# Patient Record
Sex: Female | Born: 1935
Health system: Southern US, Community
[De-identification: ages and names within clinical notes are randomized; demographics above are authoritative.]

## PROBLEM LIST (undated history)

## (undated) DIAGNOSIS — I5189 Other ill-defined heart diseases: Secondary | ICD-10-CM

## (undated) DIAGNOSIS — R4189 Other symptoms and signs involving cognitive functions and awareness: Secondary | ICD-10-CM

## (undated) DIAGNOSIS — E785 Hyperlipidemia, unspecified: Secondary | ICD-10-CM

## (undated) DIAGNOSIS — M4856XA Collapsed vertebra, not elsewhere classified, lumbar region, initial encounter for fracture: Secondary | ICD-10-CM

## (undated) DIAGNOSIS — Z9981 Dependence on supplemental oxygen: Secondary | ICD-10-CM

## (undated) DIAGNOSIS — J439 Emphysema, unspecified: Secondary | ICD-10-CM

## (undated) DIAGNOSIS — I509 Heart failure, unspecified: Secondary | ICD-10-CM

## (undated) DIAGNOSIS — I10 Essential (primary) hypertension: Secondary | ICD-10-CM

## (undated) DIAGNOSIS — S72009A Fracture of unspecified part of neck of unspecified femur, initial encounter for closed fracture: Secondary | ICD-10-CM

## (undated) DIAGNOSIS — Z66 Do not resuscitate: Secondary | ICD-10-CM

## (undated) DIAGNOSIS — K8020 Calculus of gallbladder without cholecystitis without obstruction: Secondary | ICD-10-CM

## (undated) DIAGNOSIS — I491 Atrial premature depolarization: Secondary | ICD-10-CM

## (undated) DIAGNOSIS — R002 Palpitations: Secondary | ICD-10-CM

## (undated) DIAGNOSIS — J449 Chronic obstructive pulmonary disease, unspecified: Secondary | ICD-10-CM

## (undated) DIAGNOSIS — F809 Developmental disorder of speech and language, unspecified: Secondary | ICD-10-CM

## (undated) DIAGNOSIS — L821 Other seborrheic keratosis: Secondary | ICD-10-CM

## (undated) DIAGNOSIS — Z87891 Personal history of nicotine dependence: Secondary | ICD-10-CM

## (undated) DIAGNOSIS — S22009A Unspecified fracture of unspecified thoracic vertebra, initial encounter for closed fracture: Secondary | ICD-10-CM

## (undated) DIAGNOSIS — I219 Acute myocardial infarction, unspecified: Secondary | ICD-10-CM

## (undated) DIAGNOSIS — M810 Age-related osteoporosis without current pathological fracture: Secondary | ICD-10-CM

## (undated) HISTORY — PX: TUBAL LIGATION: SHX77

---

## 2002-06-11 ENCOUNTER — Encounter: Payer: Self-pay | Admitting: Family Medicine

## 2002-06-11 ENCOUNTER — Inpatient Hospital Stay (HOSPITAL_COMMUNITY): Admission: EM | Admit: 2002-06-11 | Discharge: 2002-06-17 | Payer: Self-pay | Admitting: Internal Medicine

## 2002-06-14 ENCOUNTER — Encounter: Payer: Self-pay | Admitting: Internal Medicine

## 2002-06-16 ENCOUNTER — Encounter: Payer: Self-pay | Admitting: Internal Medicine

## 2002-06-17 ENCOUNTER — Encounter: Payer: Self-pay | Admitting: Internal Medicine

## 2003-10-14 ENCOUNTER — Ambulatory Visit (HOSPITAL_COMMUNITY): Admission: RE | Admit: 2003-10-14 | Discharge: 2003-10-14 | Payer: Self-pay | Admitting: Family Medicine

## 2003-12-30 ENCOUNTER — Ambulatory Visit (HOSPITAL_COMMUNITY): Admission: RE | Admit: 2003-12-30 | Discharge: 2003-12-30 | Payer: Self-pay | Admitting: Gastroenterology

## 2003-12-30 ENCOUNTER — Encounter (INDEPENDENT_AMBULATORY_CARE_PROVIDER_SITE_OTHER): Payer: Self-pay | Admitting: Specialist

## 2005-10-30 ENCOUNTER — Ambulatory Visit (HOSPITAL_COMMUNITY): Admission: RE | Admit: 2005-10-30 | Discharge: 2005-10-30 | Payer: Self-pay | Admitting: Ophthalmology

## 2006-02-12 ENCOUNTER — Ambulatory Visit (HOSPITAL_COMMUNITY): Admission: RE | Admit: 2006-02-12 | Discharge: 2006-02-12 | Payer: Self-pay | Admitting: Ophthalmology

## 2009-10-13 ENCOUNTER — Observation Stay (HOSPITAL_COMMUNITY): Admission: EM | Admit: 2009-10-13 | Discharge: 2009-10-14 | Payer: Self-pay | Admitting: Emergency Medicine

## 2009-10-13 ENCOUNTER — Ambulatory Visit: Payer: Self-pay | Admitting: Cardiology

## 2009-10-14 ENCOUNTER — Encounter (INDEPENDENT_AMBULATORY_CARE_PROVIDER_SITE_OTHER): Payer: Self-pay | Admitting: Internal Medicine

## 2009-12-06 ENCOUNTER — Ambulatory Visit (HOSPITAL_COMMUNITY): Admission: RE | Admit: 2009-12-06 | Discharge: 2009-12-06 | Payer: Self-pay | Admitting: Ophthalmology

## 2009-12-13 ENCOUNTER — Ambulatory Visit (HOSPITAL_COMMUNITY): Admission: RE | Admit: 2009-12-13 | Discharge: 2009-12-13 | Payer: Self-pay | Admitting: Ophthalmology

## 2011-03-15 LAB — CBC
HCT: 41.9 % (ref 36.0–46.0)
HCT: 42 % (ref 36.0–46.0)
Hemoglobin: 14.4 g/dL (ref 12.0–15.0)
Hemoglobin: 14.4 g/dL (ref 12.0–15.0)
MCHC: 34.4 g/dL (ref 30.0–36.0)
MCHC: 34.4 g/dL (ref 30.0–36.0)
MCV: 88.9 fL (ref 78.0–100.0)
MCV: 89.1 fL (ref 78.0–100.0)
Platelets: 223 10*3/uL (ref 150–400)
Platelets: 241 10*3/uL (ref 150–400)
RBC: 4.7 MIL/uL (ref 3.87–5.11)
RBC: 4.72 MIL/uL (ref 3.87–5.11)
RDW: 12.7 % (ref 11.5–15.5)
RDW: 12.8 % (ref 11.5–15.5)
WBC: 8.5 10*3/uL (ref 4.0–10.5)
WBC: 8.7 10*3/uL (ref 4.0–10.5)

## 2011-03-15 LAB — COMPREHENSIVE METABOLIC PANEL
ALT: 8 U/L (ref 0–35)
ALT: <8 U/L (ref 0–35)
AST: 15 U/L (ref 0–37)
AST: 16 U/L (ref 0–37)
Albumin: 3.3 g/dL — ABNORMAL LOW (ref 3.5–5.2)
Albumin: 3.5 g/dL (ref 3.5–5.2)
Alkaline Phosphatase: 68 U/L (ref 39–117)
Alkaline Phosphatase: 68 U/L (ref 39–117)
BUN: 2 mg/dL — ABNORMAL LOW (ref 6–23)
BUN: 2 mg/dL — ABNORMAL LOW (ref 6–23)
CO2: 30 mEq/L (ref 19–32)
CO2: 31 mEq/L (ref 19–32)
Calcium: 9.3 mg/dL (ref 8.4–10.5)
Calcium: 9.4 mg/dL (ref 8.4–10.5)
Chloride: 101 mEq/L (ref 96–112)
Chloride: 98 mEq/L (ref 96–112)
Creatinine, Ser: 0.52 mg/dL (ref 0.4–1.2)
Creatinine, Ser: 0.6 mg/dL (ref 0.4–1.2)
GFR calc Af Amer: >60 mL/min (ref >60)
GFR calc Af Amer: >60 mL/min (ref >60)
GFR calc non Af Amer: >60 mL/min (ref >60)
GFR calc non Af Amer: >60 mL/min (ref >60)
Glucose, Bld: 83 mg/dL (ref 70–99)
Glucose, Bld: 85 mg/dL (ref 70–99)
Potassium: 3.2 mEq/L — ABNORMAL LOW (ref 3.5–5.1)
Potassium: 4.5 mEq/L (ref 3.5–5.1)
Sodium: 135 mEq/L (ref 135–145)
Sodium: 137 mEq/L (ref 135–145)
Total Bilirubin: 0.6 mg/dL (ref 0.3–1.2)
Total Bilirubin: 0.8 mg/dL (ref 0.3–1.2)
Total Protein: 5.6 g/dL — ABNORMAL LOW (ref 6.0–8.3)
Total Protein: 6.4 g/dL (ref 6.0–8.3)

## 2011-03-15 LAB — DIFFERENTIAL
Basophils Absolute: 0 10*3/uL (ref 0.0–0.1)
Basophils Absolute: 0.1 10*3/uL (ref 0.0–0.1)
Basophils Relative: 0 % (ref 0–1)
Basophils Relative: 1 % (ref 0–1)
Eosinophils Absolute: 0.8 10*3/uL — ABNORMAL HIGH (ref 0.0–0.7)
Eosinophils Absolute: 0.8 10*3/uL — ABNORMAL HIGH (ref 0.0–0.7)
Eosinophils Relative: 10 % — ABNORMAL HIGH (ref 0–5)
Eosinophils Relative: 9 % — ABNORMAL HIGH (ref 0–5)
Lymphocytes Relative: 28 % (ref 12–46)
Lymphocytes Relative: 40 % (ref 12–46)
Lymphs Abs: 2.5 10*3/uL (ref 0.7–4.0)
Lymphs Abs: 3.4 10*3/uL (ref 0.7–4.0)
Monocytes Absolute: 0.8 10*3/uL (ref 0.1–1.0)
Monocytes Absolute: 0.9 10*3/uL (ref 0.1–1.0)
Monocytes Relative: 10 % (ref 3–12)
Monocytes Relative: 9 % (ref 3–12)
Neutro Abs: 3.4 10*3/uL (ref 1.7–7.7)
Neutro Abs: 4.5 10*3/uL (ref 1.7–7.7)
Neutrophils Relative %: 41 % — ABNORMAL LOW (ref 43–77)
Neutrophils Relative %: 52 % (ref 43–77)

## 2011-03-15 LAB — TSH: TSH: 0.604 u[IU]/mL (ref 0.350–4.500)

## 2011-03-15 LAB — LIPID PANEL
Cholesterol: 227 mg/dL — ABNORMAL HIGH (ref 0–200)
HDL: 55 mg/dL (ref >39)
LDL Cholesterol: 157 mg/dL — ABNORMAL HIGH (ref 0–99)
Total CHOL/HDL Ratio: 4.1 RATIO
Triglycerides: 77 mg/dL (ref <150)
VLDL: 15 mg/dL (ref 0–40)

## 2011-03-15 LAB — POCT CARDIAC MARKERS
CKMB, poc: <1 ng/mL — ABNORMAL LOW (ref 1.0–8.0)
CKMB, poc: <1 ng/mL — ABNORMAL LOW (ref 1.0–8.0)
Myoglobin, poc: 53.9 ng/mL (ref 12–200)
Myoglobin, poc: 59 ng/mL (ref 12–200)
Troponin i, poc: <0.05 ng/mL (ref 0.00–0.09)
Troponin i, poc: <0.05 ng/mL (ref 0.00–0.09)

## 2011-03-15 LAB — RAPID URINE DRUG SCREEN, HOSP PERFORMED
Amphetamines: NOT DETECTED
Barbiturates: NOT DETECTED
Benzodiazepines: NOT DETECTED
Cocaine: NOT DETECTED
Opiates: NOT DETECTED
Tetrahydrocannabinol: NOT DETECTED

## 2011-03-15 LAB — CARDIAC PANEL(CRET KIN+CKTOT+MB+TROPI)
CK, MB: 1.1 ng/mL (ref 0.3–4.0)
Relative Index: INVALID (ref 0.0–2.5)
Total CK: 31 U/L (ref 7–177)
Troponin I: 0.01 ng/mL (ref 0.00–0.06)

## 2011-03-15 LAB — CK TOTAL AND CKMB (NOT AT ARMC)
CK, MB: 1.1 ng/mL (ref 0.3–4.0)
Relative Index: INVALID (ref 0.0–2.5)
Total CK: 38 U/L (ref 7–177)

## 2011-03-15 LAB — LIPASE, BLOOD: Lipase: 24 U/L (ref 11–59)

## 2011-03-15 LAB — BRAIN NATRIURETIC PEPTIDE: Pro B Natriuretic peptide (BNP): <30 pg/mL (ref 0.0–100.0)

## 2011-04-28 NOTE — H&P (Signed)
[facility]  Patient:    Tiffany Maxwell, Tiffany Maxwell Visit Number: [id number] MRN: [id number]          Service Type: MED Location: 2A A219 01 Attending Physician:  Cassell Smiles. Dictated by:   John Giovanni, M.D. Admit Date:  06/11/2002   CC:         Rulon Abide, M.D.  Montey Hora, M.D., [facility]   History and Physical  HISTORY OF PRESENT ILLNESS:  This 77 year old woman has had a somewhat productive cough for the last two days.  She has fever, chills, and weakness. She has been short of breath.  She has been free of dysuria, abdominal pain, diarrhea, headache, or rash.  She was seen by her physician, Dr. Montey Hora, who felt that she probably had pneumonia and referred her to [facility] hospitalist.  MEDICATIONS:  Currently she is on Avalide 150/12.5 q.d. for hypertension. ALLERGIES:  CODEINE causes headaches.  SOCIAL HISTORY:  She smokes a pack a day and smoked since age 41, giving about a 50-pack-year history of cigarette smoking.  She has seven children, 13 grandchildren and, except for the first child delivered by Dr. Alona Bene, the rest were delivered by Dr. Tomasa Hose at [facility].  She otherwise has not been admitted to the hospital.  Lives with her husband in [place].  Her pulse oximetry was 86%.  Her urine output has been low.  EMERGENCY ROOM PHYSICAL EXAMINATION:  GENERAL:  Elderly woman appearing older than her stated age who is quite frail.  She appeared to be oriented and alert, in no acute distress.  She is able to give a fairly good history and answer questions.  Sentence structure was intact.  There was no slurring of her speech.  VITAL SIGNS:  Temperature 99.3, pulse 93, respiratory rate 28, blood pressure 96/49.  Height 5 feet 3 inches, weight 154 pounds.  Her O2 saturation on room air was 90%.  Recheck BPs were 81/52 and 85/57.  HEENT:  Mucous membranes were dry.  TMs gray.  Good light  reflexes.  Negative anterior and cervical nodes.  No axillary or supraclavicular adenopathy.  LUNGS:  Decreased breath sounds throughout.  Occasional crackles.  HEART:  Sounds were distant.  Rate of around 90.  ABDOMEN:  Soft.  Without hepatosplenomegaly or mass.  EXTREMITIES:  No edema of the ankles.  LABORATORY DATA:  Blood work included a blood gas on room air which showed pH 7.41, pCO2 41, pO2 52, bicarbonate 26.  White cell count 2300, 78% neutrophils, 18 lymphs, 5 monos.  H&H 14.6/41.6, MCV 84, platelet count 74,000.  Comprehensive metabolic panel showed sodium 132, glucose 166, potassium 3.4, albumin 3.2, SGOT 54.  DIAGNOSES: 1. Probable pneumonia. 2. Chronic obstructive pulmonary disease. 3. Long history of cigarette smoking. 4. Essential hypertension. 5. Electrolyte abnormalities including hyponatremia and hypokalemia. 6. Hyperglycemia. 7. Elevated SGOT.  PLAN:  Plan of treatment includes O2 at 2 L/min by nasal prongs. IV bolus due to her initial hypotension and then 100 cc/hr of normal saline for the first liter, then down to 75 cc/hr thereafter.  Will recheck CBC, MET-7 in the morning.  Start her on Rocephin 1 g IV q.24h., first dose stat, with Zithromax 500 mg immediately, followed by 250 q.d. for four days.  Chest x-ray is pending.  Other etiologies to her illness are possible, including West Nile virus, although this would be less likely since she is actually coughing up small amounts of thick sputum.  Sputum for  Grams stain, CNS pending. Dictated by:   John Giovanni, M.D. Attending Physician:  Cassell Smiles DD:  06/11/02 TD:  06/15/02 Job: [id number]

## 2011-04-28 NOTE — Discharge Summary (Signed)
[facility]  Patient:    Tiffany Maxwell, Tiffany Maxwell Visit Number: [id number] MRN: [id number]          Service Type: MED Location: 2A A219 01 Attending Physician:  Marcene Corning Dictated by:   Rulon Abide, M.D. Admit Date:  06/11/2002 Discharge Date: 06/17/2002   CC:         [facility], Attention: Dr. Dimple Casey, Valinda Hoar [phone] and mail CC also                           Discharge Summary  DISCHARGE DIAGNOSES: 1. Chronic obstructive pulmonary disease exacerbation--much improved by day of    discharge. Discharged on Combivent, Flovent, and albuterol inhalers as    noted below along with prednisone taper. 2. Questionable congestive heart failure on one of her chest x-rays this    admission, but 2-D echocardiogram was normal as noted below. 3. Questionable right lower lobe pneumonia--much improved by day of discharge,    on Levaquin. Discharged on Levaquin for an additional three days. 4. Cigarette abuse discussed with her in detail during this hospitalization. 5. Diarrhea--resolved by day of discharge. Probably secondary to infection.    There was no Clostridium difficile noted. 6. Decreased platelets on admission probably secondary to infection, now back    to normal. 7. Decreased white blood cells on admission. Question secondary to infection,    now back to normal on last check.  DISCHARGE MEDICATIONS: 1. Prednisone. She will take 50 mg tomorrow and decrease by 10 mg every day    until she is off. 2. Combivent metered-dose inhaler two puffs every 8 hours. 3. Albuterol metered-dose inhaler two puffs every 4 hours as needed for    wheezing. 4. Flovent metered-dose inhaler two puffs every 12 hours and she is to rinse    and spit after. 5. Pepcid 20 mg tabs one p.o. b.i.d.  FOLLOWUP:  She will follow up with [facility] medicine and they will call her today for an appointment and give her the day and time.  DIET:   Low salt, as tolerated.  ACTIVITY:   No strenuous activity, no heavy lifting.  DISCHARGE INSTRUCTIONS:  She is to stop smoking.  HISTORY AND PHYSICAL:  Please refer to the dictated History and Physical by Dr. Sudie Bailey.  LABORATORY AND ACCESSORY DATA:  The patients initial white cell count was 2.3, last check on June 16, 2002 was 12.2. Her initial platelet count was 74,000 and last check on June 16, 2002 it was 161,000. Her initial sodium was 132, last check was 138. Initial potassium was 3.4, last check on July 5th was 5.0. Her BUN was 7, creatinine 0.6 last check. AST was very slightly elevated on admission at 54. The remainder of her liver function tests were normal. Her TSH was normal at 0.58. Stool cultures were negative to date and a C. difficile toxin was negative.  Her x-ray on admission showed COPD changes. Repeat chest x-ray on June 14, 2002 showed some questionable CHF and some small bilateral effusions. On the day of discharge, her chest x-ray showed a questionable right lower lobe small infiltrate but no CHF, and no effusions. 2-D echocardiogram showed normal left ventricular systolic function, normal right ventricular systolic function, no wall motion abnormalities. Right ventricular systolic pressure was 36 mmHg. Her EKG showed sinus rhythm with T wave flattening in V6, T-wave inversions in V2, some mild T wave flattening in her inferior leads.  HOSPITAL COURSE: #1 - CHRONIC OBSTRUCTIVE PULMONARY DISEASE EXACERBATION:  On admission, the patient did have significant amount of wheezing and shortness of breath, and hypoxemia consistent with COPD exacerbation. She has a long history of cigarette abuse. She was treated with IV steroids and hand-held nebulizers initially, and we were able to change her to metered-dose inhalers and p.o. steroids as noted above. She will be discharged on prednisone taper as noted above along with her inhalers. She was on O2 at admission and by  day of discharge we were able to take her to room air, and after ambulating in the halls her saturations were 91-92%. She was not wheezing on exam the day of discharge.  #2 - QUESTIONABLE CONGESTIVE HEART FAILURE:  This was noted on one of her chest x-rays. A 2-D echocardiogram did not show any significant left ventricular or right ventricular systolic function, and there was no wall motion abnormalities. This may have been from overhydration initially. I did not do any further testing at this time.  #3 - THROMBOCYTOPENIA:  Her platelets were noted to be low on admission. I felt this was probably secondary to infection. They did return to normal by day of discharge and the labs that were done one day prior to discharge.  #4 - LEUKOPENIA:  This was also noted on admission but by one day prior to discharge her white cell counts were back to normal. I feel that she needs another CBC after finishing her prednisone taper to see if her white cell counts will go down again.  #5 - DIARRHEA:  She did develop diarrhea during this hospitalization, treated with Imodium and resolved. Stool studies did not show any significant bacterial infection and C. difficile toxin was negative.  #6 - HYPERTENSION:  I held her blood pressure medicines throughout this hospitalization and treated her with low salt diet. She has had normal blood pressure checks throughout the entire hospitalization with systolic blood pressure readings in the 115-140 range. Diastolics have all been less than 90. I would simply ask her to stay on a low salt diet, but she may have to have her medication restarted by her primary care physician at followup as she may not follow a strict low salt diet at home. Dictated by:   Rulon Abide, M.D. Attending Physician:  Marcene Corning DD:  06/17/02 TD:  06/19/02 Job: [id number]

## 2011-04-28 NOTE — Op Note (Signed)
NAME:  Tiffany Maxwell, TOLLIVER                       ACCOUNT NO.:  [id number]   MEDICAL RECORD NO.:  [id number]                   PATIENT TYPE:  AMB   LOCATION:  ENDO                                 FACILITY:  [facility]   PHYSICIAN:  John C. Madilyn Fireman, M.D.                 DATE OF BIRTH:  1934/07/26   DATE OF PROCEDURE:  12/30/2003  DATE OF DISCHARGE:                                 OPERATIVE REPORT   PROCEDURE:  Colonoscopy.   INDICATIONS FOR PROCEDURE:  Occasional rectal bleeding in a 77 year old  patient with no recent  colon screening.   DESCRIPTION OF PROCEDURE:  The patient was placed in the left lateral  decubitus position and placed on the pulse monitor with continuous low flow  oxygen delivered by nasal cannula. She was sedated with 70 micrograms of IV  Fentanyl and 7 mg of IV Versed.   The Olympus video colonoscope was inserted into the rectum and advanced to  the cecum, confirmed by transillumination of McBurney's point and  visualization of the ileocecal valve and appendiceal orifice.  The prep was  excellent.   The cecum, ascending , transverse, descending and sigmoid colon all appeared  normal with no masses, polyps, diverticula or other mucosal abnormalities.  There was a 1.2-cm sessile polyp at about 20 cm near the rectosigmoid  junction  and this was removed by snare. A slightly smaller 1-cm sessile  polyp was found at 15 cm in the rectum and removed by snare and sent in the  same specimen container. The remainder of the rectum likewise appeared  normal down to the anus where retroflex view revealed no obviously enlarged  internal hemorrhoids.   The colonoscope was then withdrawn and the patient was returned to the  recovery room in stable condition. She tolerated the procedure well and  there were no immediate complications.   IMPRESSION:  Rectosigmoid and rectal polyps.   PLAN:  Await histology to determine method and interval for future colon  screening and to rule out  malignancy.                                               John C. Madilyn Fireman, M.D.    [facility]/MEDQ  D:  12/30/2003  T:  12/30/2003  Job:  [id number]   cc:   Magnus Sinning. Dimple Casey, M.D.  [address] [place]  [place] [address]  Fax: [phone]

## 2012-01-23 DIAGNOSIS — Z961 Presence of intraocular lens: Secondary | ICD-10-CM | POA: Diagnosis not present

## 2012-01-23 DIAGNOSIS — H53469 Homonymous bilateral field defects, unspecified side: Secondary | ICD-10-CM | POA: Diagnosis not present

## 2012-01-23 DIAGNOSIS — H538 Other visual disturbances: Secondary | ICD-10-CM | POA: Diagnosis not present

## 2012-02-26 DIAGNOSIS — R7989 Other specified abnormal findings of blood chemistry: Secondary | ICD-10-CM | POA: Diagnosis not present

## 2012-02-26 DIAGNOSIS — E785 Hyperlipidemia, unspecified: Secondary | ICD-10-CM | POA: Diagnosis not present

## 2012-02-26 DIAGNOSIS — I252 Old myocardial infarction: Secondary | ICD-10-CM | POA: Diagnosis not present

## 2012-02-26 DIAGNOSIS — E559 Vitamin D deficiency, unspecified: Secondary | ICD-10-CM | POA: Diagnosis not present

## 2012-02-26 DIAGNOSIS — I10 Essential (primary) hypertension: Secondary | ICD-10-CM | POA: Diagnosis not present

## 2012-04-04 DIAGNOSIS — E538 Deficiency of other specified B group vitamins: Secondary | ICD-10-CM | POA: Diagnosis not present

## 2012-04-18 DIAGNOSIS — E538 Deficiency of other specified B group vitamins: Secondary | ICD-10-CM | POA: Diagnosis not present

## 2012-05-02 DIAGNOSIS — E538 Deficiency of other specified B group vitamins: Secondary | ICD-10-CM | POA: Diagnosis not present

## 2012-06-11 ENCOUNTER — Other Ambulatory Visit: Payer: Self-pay | Admitting: Gastroenterology

## 2012-06-11 DIAGNOSIS — D126 Benign neoplasm of colon, unspecified: Secondary | ICD-10-CM | POA: Diagnosis not present

## 2012-06-11 DIAGNOSIS — K5730 Diverticulosis of large intestine without perforation or abscess without bleeding: Secondary | ICD-10-CM | POA: Diagnosis not present

## 2012-06-11 DIAGNOSIS — Z86010 Personal history of colonic polyps: Secondary | ICD-10-CM | POA: Diagnosis not present

## 2012-06-11 DIAGNOSIS — Z09 Encounter for follow-up examination after completed treatment for conditions other than malignant neoplasm: Secondary | ICD-10-CM | POA: Diagnosis not present

## 2012-10-16 DIAGNOSIS — E559 Vitamin D deficiency, unspecified: Secondary | ICD-10-CM | POA: Diagnosis not present

## 2012-10-16 DIAGNOSIS — E785 Hyperlipidemia, unspecified: Secondary | ICD-10-CM | POA: Diagnosis not present

## 2012-10-16 DIAGNOSIS — R5381 Other malaise: Secondary | ICD-10-CM | POA: Diagnosis not present

## 2012-10-16 DIAGNOSIS — J441 Chronic obstructive pulmonary disease with (acute) exacerbation: Secondary | ICD-10-CM | POA: Diagnosis not present

## 2012-10-16 DIAGNOSIS — R5383 Other fatigue: Secondary | ICD-10-CM | POA: Diagnosis not present

## 2012-10-16 DIAGNOSIS — I10 Essential (primary) hypertension: Secondary | ICD-10-CM | POA: Diagnosis not present

## 2013-01-03 DIAGNOSIS — M545 Low back pain: Secondary | ICD-10-CM | POA: Diagnosis not present

## 2013-01-16 DIAGNOSIS — J441 Chronic obstructive pulmonary disease with (acute) exacerbation: Secondary | ICD-10-CM | POA: Diagnosis not present

## 2013-01-16 DIAGNOSIS — M546 Pain in thoracic spine: Secondary | ICD-10-CM | POA: Diagnosis not present

## 2013-01-20 DIAGNOSIS — L509 Urticaria, unspecified: Secondary | ICD-10-CM | POA: Diagnosis not present

## 2013-01-28 ENCOUNTER — Encounter (HOSPITAL_COMMUNITY): Payer: Self-pay

## 2013-01-28 ENCOUNTER — Inpatient Hospital Stay (HOSPITAL_COMMUNITY)
Admission: EM | Admit: 2013-01-28 | Discharge: 2013-01-30 | DRG: 190 | Disposition: A | Discharge status: Home Health | Payer: Medicare Other | Attending: Internal Medicine | Admitting: Internal Medicine

## 2013-01-28 ENCOUNTER — Observation Stay (HOSPITAL_COMMUNITY): Payer: Medicare Other

## 2013-01-28 ENCOUNTER — Emergency Department (HOSPITAL_COMMUNITY): Payer: Medicare Other

## 2013-01-28 DIAGNOSIS — E785 Hyperlipidemia, unspecified: Secondary | ICD-10-CM | POA: Diagnosis present

## 2013-01-28 DIAGNOSIS — T148XXA Other injury of unspecified body region, initial encounter: Secondary | ICD-10-CM | POA: Diagnosis not present

## 2013-01-28 DIAGNOSIS — IMO0002 Reserved for concepts with insufficient information to code with codable children: Secondary | ICD-10-CM

## 2013-01-28 DIAGNOSIS — I5189 Other ill-defined heart diseases: Secondary | ICD-10-CM

## 2013-01-28 DIAGNOSIS — M8448XA Pathological fracture, other site, initial encounter for fracture: Secondary | ICD-10-CM | POA: Diagnosis present

## 2013-01-28 DIAGNOSIS — J439 Emphysema, unspecified: Secondary | ICD-10-CM

## 2013-01-28 DIAGNOSIS — S22009A Unspecified fracture of unspecified thoracic vertebra, initial encounter for closed fracture: Secondary | ICD-10-CM | POA: Diagnosis not present

## 2013-01-28 DIAGNOSIS — J449 Chronic obstructive pulmonary disease, unspecified: Secondary | ICD-10-CM | POA: Diagnosis not present

## 2013-01-28 DIAGNOSIS — J441 Chronic obstructive pulmonary disease with (acute) exacerbation: Secondary | ICD-10-CM

## 2013-01-28 DIAGNOSIS — J440 Chronic obstructive pulmonary disease with acute lower respiratory infection: Principal | ICD-10-CM | POA: Diagnosis present

## 2013-01-28 DIAGNOSIS — R21 Rash and other nonspecific skin eruption: Secondary | ICD-10-CM | POA: Diagnosis present

## 2013-01-28 DIAGNOSIS — I509 Heart failure, unspecified: Secondary | ICD-10-CM | POA: Diagnosis not present

## 2013-01-28 DIAGNOSIS — Z87891 Personal history of nicotine dependence: Secondary | ICD-10-CM | POA: Diagnosis not present

## 2013-01-28 DIAGNOSIS — I491 Atrial premature depolarization: Secondary | ICD-10-CM | POA: Diagnosis present

## 2013-01-28 DIAGNOSIS — Z79899 Other long term (current) drug therapy: Secondary | ICD-10-CM | POA: Diagnosis not present

## 2013-01-28 DIAGNOSIS — I252 Old myocardial infarction: Secondary | ICD-10-CM

## 2013-01-28 DIAGNOSIS — I10 Essential (primary) hypertension: Secondary | ICD-10-CM | POA: Diagnosis not present

## 2013-01-28 DIAGNOSIS — J438 Other emphysema: Secondary | ICD-10-CM | POA: Diagnosis not present

## 2013-01-28 DIAGNOSIS — R0600 Dyspnea, unspecified: Secondary | ICD-10-CM

## 2013-01-28 DIAGNOSIS — R7309 Other abnormal glucose: Secondary | ICD-10-CM | POA: Diagnosis present

## 2013-01-28 DIAGNOSIS — M4854XA Collapsed vertebra, not elsewhere classified, thoracic region, initial encounter for fracture: Secondary | ICD-10-CM | POA: Diagnosis present

## 2013-01-28 DIAGNOSIS — E876 Hypokalemia: Secondary | ICD-10-CM

## 2013-01-28 DIAGNOSIS — T380X5A Adverse effect of glucocorticoids and synthetic analogues, initial encounter: Secondary | ICD-10-CM | POA: Diagnosis present

## 2013-01-28 DIAGNOSIS — E871 Hypo-osmolality and hyponatremia: Secondary | ICD-10-CM | POA: Diagnosis not present

## 2013-01-28 DIAGNOSIS — J209 Acute bronchitis, unspecified: Principal | ICD-10-CM | POA: Diagnosis present

## 2013-01-28 DIAGNOSIS — R0609 Other forms of dyspnea: Secondary | ICD-10-CM | POA: Diagnosis not present

## 2013-01-28 DIAGNOSIS — K8020 Calculus of gallbladder without cholecystitis without obstruction: Secondary | ICD-10-CM | POA: Diagnosis present

## 2013-01-28 DIAGNOSIS — M810 Age-related osteoporosis without current pathological fracture: Secondary | ICD-10-CM | POA: Diagnosis not present

## 2013-01-28 DIAGNOSIS — R0902 Hypoxemia: Secondary | ICD-10-CM | POA: Diagnosis not present

## 2013-01-28 DIAGNOSIS — J189 Pneumonia, unspecified organism: Secondary | ICD-10-CM | POA: Diagnosis not present

## 2013-01-28 DIAGNOSIS — R0602 Shortness of breath: Secondary | ICD-10-CM | POA: Diagnosis not present

## 2013-01-28 DIAGNOSIS — J4489 Other specified chronic obstructive pulmonary disease: Secondary | ICD-10-CM | POA: Diagnosis not present

## 2013-01-28 HISTORY — DX: Other ill-defined heart diseases: I51.89

## 2013-01-28 HISTORY — DX: Calculus of gallbladder without cholecystitis without obstruction: K80.20

## 2013-01-28 HISTORY — DX: Unspecified fracture of unspecified thoracic vertebra, initial encounter for closed fracture: S22.009A

## 2013-01-28 HISTORY — DX: Other seborrheic keratosis: L82.1

## 2013-01-28 HISTORY — DX: Essential (primary) hypertension: I10

## 2013-01-28 HISTORY — DX: Palpitations: R00.2

## 2013-01-28 HISTORY — DX: Atrial premature depolarization: I49.1

## 2013-01-28 HISTORY — DX: Emphysema, unspecified: J43.9

## 2013-01-28 HISTORY — DX: Hyperlipidemia, unspecified: E78.5

## 2013-01-28 HISTORY — DX: Chronic obstructive pulmonary disease, unspecified: J44.9

## 2013-01-28 HISTORY — DX: Age-related osteoporosis without current pathological fracture: M81.0

## 2013-01-28 HISTORY — DX: Personal history of nicotine dependence: Z87.891

## 2013-01-28 HISTORY — DX: Acute myocardial infarction, unspecified: I21.9

## 2013-01-28 LAB — TROPONIN I: Troponin I: <0.3 ng/mL (ref <0.30)

## 2013-01-28 LAB — URINALYSIS, ROUTINE W REFLEX MICROSCOPIC
Glucose, UA: NEGATIVE mg/dL
Leukocytes, UA: NEGATIVE
Protein, ur: NEGATIVE mg/dL
Specific Gravity, Urine: 1.015 (ref 1.005–1.030)
Urobilinogen, UA: 0.2 mg/dL (ref 0.0–1.0)

## 2013-01-28 LAB — CBC WITH DIFFERENTIAL/PLATELET
Basophils Absolute: 0 10*3/uL (ref 0.0–0.1)
Eosinophils Relative: 7 % — ABNORMAL HIGH (ref 0–5)
Lymphocytes Relative: 31 % (ref 12–46)
Lymphs Abs: 2.5 10*3/uL (ref 0.7–4.0)
MCV: 85.1 fL (ref 78.0–100.0)
Neutrophils Relative %: 52 % (ref 43–77)
Platelets: 266 10*3/uL (ref 150–400)
RBC: 4.98 MIL/uL (ref 3.87–5.11)
RDW: 12.8 % (ref 11.5–15.5)
WBC: 8.1 10*3/uL (ref 4.0–10.5)

## 2013-01-28 LAB — BASIC METABOLIC PANEL
CO2: 33 mEq/L — ABNORMAL HIGH (ref 19–32)
Calcium: 9.8 mg/dL (ref 8.4–10.5)
GFR calc non Af Amer: 89 mL/min — ABNORMAL LOW (ref >90)
Potassium: 3.3 mEq/L — ABNORMAL LOW (ref 3.5–5.1)
Sodium: 131 mEq/L — ABNORMAL LOW (ref 135–145)

## 2013-01-28 LAB — MAGNESIUM: Magnesium: 1.8 mg/dL (ref 1.5–2.5)

## 2013-01-28 MED ORDER — ALUM & MAG HYDROXIDE-SIMETH 200-200-20 MG/5ML PO SUSP: 30.0000 mL | Freq: Four times a day (QID) | ORAL | Status: DC | PRN

## 2013-01-28 MED ORDER — SENNA 8.6 MG PO TABS
1.0000 | ORAL_TABLET | Freq: Two times a day (BID) | ORAL | Status: DC
  Administered 2013-01-29: 8.6 mg via ORAL
  Filled 2013-01-28 (×3): qty 1

## 2013-01-28 MED ORDER — LEVOFLOXACIN IN D5W 750 MG/150ML IV SOLN
750.0000 mg | Freq: Once | INTRAVENOUS | Status: AC
  Administered 2013-01-28: 750 mg via INTRAVENOUS
  Filled 2013-01-28: qty 150

## 2013-01-28 MED ORDER — LEVOFLOXACIN IN D5W 750 MG/150ML IV SOLN
750.0000 mg | INTRAVENOUS | Status: DC
  Administered 2013-01-29: 750 mg via INTRAVENOUS
  Filled 2013-01-28 (×2): qty 150

## 2013-01-28 MED ORDER — ASPIRIN EC 325 MG PO TBEC
325.0000 mg | DELAYED_RELEASE_TABLET | Freq: Every day | ORAL | Status: DC
  Administered 2013-01-29 – 2013-01-30 (×2): 325 mg via ORAL
  Filled 2013-01-28 (×2): qty 1

## 2013-01-28 MED ORDER — ONDANSETRON HCL 4 MG PO TABS: 4.0000 mg | ORAL_TABLET | Freq: Four times a day (QID) | ORAL | Status: DC | PRN

## 2013-01-28 MED ORDER — ONDANSETRON HCL 4 MG/2ML IJ SOLN: 4.0000 mg | Freq: Four times a day (QID) | INTRAMUSCULAR | Status: DC | PRN

## 2013-01-28 MED ORDER — ALBUTEROL SULFATE (5 MG/ML) 0.5% IN NEBU
5.0000 mg | INHALATION_SOLUTION | Freq: Once | RESPIRATORY_TRACT | Status: AC
  Administered 2013-01-28: 5 mg via RESPIRATORY_TRACT
  Filled 2013-01-28: qty 1

## 2013-01-28 MED ORDER — SODIUM CHLORIDE 0.9 % IV SOLN: 250.0000 mL | INTRAVENOUS | Status: DC | PRN

## 2013-01-28 MED ORDER — ZOLPIDEM TARTRATE 5 MG PO TABS
5.0000 mg | ORAL_TABLET | Freq: Every evening | ORAL | Status: DC | PRN
  Filled 2013-01-28: qty 1

## 2013-01-28 MED ORDER — SODIUM CHLORIDE 0.9 % IJ SOLN: 3.0000 mL | INTRAMUSCULAR | Status: DC | PRN

## 2013-01-28 MED ORDER — ACETAMINOPHEN 325 MG PO TABS: 650.0000 mg | ORAL_TABLET | Freq: Four times a day (QID) | ORAL | Status: DC | PRN

## 2013-01-28 MED ORDER — ALBUTEROL SULFATE (5 MG/ML) 0.5% IN NEBU
2.5000 mg | INHALATION_SOLUTION | Freq: Four times a day (QID) | RESPIRATORY_TRACT | Status: DC
  Administered 2013-01-29 – 2013-01-30 (×5): 2.5 mg via RESPIRATORY_TRACT
  Filled 2013-01-28 (×5): qty 0.5

## 2013-01-28 MED ORDER — IPRATROPIUM BROMIDE 0.02 % IN SOLN
0.5000 mg | Freq: Four times a day (QID) | RESPIRATORY_TRACT | Status: DC
  Administered 2013-01-29 – 2013-01-30 (×5): 0.5 mg via RESPIRATORY_TRACT
  Filled 2013-01-28 (×5): qty 2.5

## 2013-01-28 MED ORDER — ACETAMINOPHEN 650 MG RE SUPP: 650.0000 mg | Freq: Four times a day (QID) | RECTAL | Status: DC | PRN

## 2013-01-28 MED ORDER — POTASSIUM CHLORIDE CRYS ER 20 MEQ PO TBCR
40.0000 meq | EXTENDED_RELEASE_TABLET | Freq: Once | ORAL | Status: AC
  Administered 2013-01-29: 40 meq via ORAL
  Filled 2013-01-28: qty 2

## 2013-01-28 MED ORDER — ENOXAPARIN SODIUM 30 MG/0.3ML ~~LOC~~ SOLN
30.0000 mg | SUBCUTANEOUS | Status: DC
  Filled 2013-01-28: qty 0.3

## 2013-01-28 MED ORDER — HYDROCODONE-ACETAMINOPHEN 5-325 MG PO TABS
1.0000 | ORAL_TABLET | ORAL | Status: DC | PRN
  Filled 2013-01-28: qty 2

## 2013-01-28 MED ORDER — ALBUTEROL SULFATE (5 MG/ML) 0.5% IN NEBU: 2.5000 mg | INHALATION_SOLUTION | RESPIRATORY_TRACT | Status: DC | PRN

## 2013-01-28 MED ORDER — IPRATROPIUM BROMIDE 0.02 % IN SOLN
0.5000 mg | Freq: Once | RESPIRATORY_TRACT | Status: AC
  Administered 2013-01-28: 0.5 mg via RESPIRATORY_TRACT
  Filled 2013-01-28: qty 2.5

## 2013-01-28 MED ORDER — SODIUM CHLORIDE 0.9 % IJ SOLN
3.0000 mL | Freq: Two times a day (BID) | INTRAMUSCULAR | Status: DC
  Administered 2013-01-28 – 2013-01-30 (×4): 3 mL via INTRAVENOUS

## 2013-01-28 MED ORDER — METHYLPREDNISOLONE SODIUM SUCC 125 MG IJ SOLR
125.0000 mg | Freq: Once | INTRAMUSCULAR | Status: AC
  Administered 2013-01-28: 125 mg via INTRAVENOUS
  Filled 2013-01-28: qty 2

## 2013-01-28 NOTE — ED Notes (Signed)
Pt reports was started on benicar approx 2 weeks ago and started breaking out in a red raised rash on face upper torso, neck, and hands.  Today c/o SOB.  PT went to PCP's office and when walked o2 sat decreased to 80's on room air.  Resting 02 sat was 97% on room air.

## 2013-01-28 NOTE — ED Provider Notes (Signed)
History     CSN: [id number]  Arrival date & time 01/28/13  1631   First MD Initiated Contact with Patient 01/28/13 1700      Chief Complaint  Patient presents with  . Allergic Reaction     HPI Pt was seen at 1730.   Per pt, c/o gradual onset and worsening of persistent SOB for the past day.  Pt was eval by her PMD PTA, and sent to the ED for further eval after pt's O2 Sats in office were "80's" on R/A.  Denies CP/palpitations, no back pain, no abd pain, no N/V/D, no fevers.  Pt also c/o "red rash" to her face, neck and bilat hands that began 2 weeks ago after starting a new medication (benicar).  Pt states the rash began 2 days after she started to take the medicine. Endorses she self-discontinued the medication at that time.  States she was eval by her PMD 1 week ago for same, "given a shot," and told to use OTC benadryl.  States rash is improving.  Denies new areas of rash, denies sore throat/dysphagia, no intra-oral edema.      Past Medical History  Diagnosis Date  . Osteoporosis   . Palpitations   . PAC (premature atrial contraction)   . Seborrheic keratosis   . Hypertension   . Hyperlipidemia   . COPD (chronic obstructive pulmonary disease)   . MI (myocardial infarction)     Past Surgical History  Procedure Laterality Date  . Tubal ligation      History  Substance Use Topics  . Smoking status: Former Games developer  . Smokeless tobacco: Not on file  . Alcohol Use: No    Review of Systems ROS: Statement: All systems negative except as marked or noted in the HPI; Constitutional: Negative for fever and chills. ; ; Eyes: Negative for eye pain, redness and discharge. ; ; ENMT: Negative for ear pain, hoarseness, nasal congestion, sinus pressure and sore throat. ; ; Cardiovascular: Negative for chest pain, palpitations, diaphoresis, and peripheral edema. ; ; Respiratory: +SOB. Negative for cough, wheezing and stridor. ; ; Gastrointestinal: Negative for nausea, vomiting, diarrhea,  abdominal pain, blood in stool, hematemesis, jaundice and rectal bleeding. . ; ; Genitourinary: Negative for dysuria, flank pain and hematuria. ; ; Musculoskeletal: Negative for back pain and neck pain. Negative for swelling and trauma.; ; Skin: +rash. Negative for pruritus, abrasions, blisters, bruising and skin lesion.; ; Neuro: Negative for headache, lightheadedness and neck stiffness. Negative for weakness, altered level of consciousness , altered mental status, extremity weakness, paresthesias, involuntary movement, seizure and syncope.       Allergies  Codeine and Naproxen  Home Medications   Current Outpatient Rx  Name  Route  Sig  Dispense  Refill  . acetaminophen (TYLENOL) 500 MG tablet   Oral   Take 500 mg by mouth every 5 (five) hours as needed for pain.         Marland Kitchen albuterol-ipratropium (COMBIVENT) 18-103 MCG/ACT inhaler   Inhalation   Inhale 2 puffs into the lungs 4 (four) times daily.         Marland Kitchen aspirin EC 81 MG tablet   Oral   Take 81 mg by mouth every morning.         . diphenhydrAMINE-zinc acetate (BENADRYL) cream   Topical   Apply 1 application topically daily as needed for itching.         . olmesartan-hydrochlorothiazide (BENICAR HCT) 40-25 MG per tablet   Oral  Take 1 tablet by mouth every morning.           BP 109/83  Pulse 70  Temp(Src) 98.5 F (36.9 C) (Oral)  Resp 22  Ht 5\' 4"  (1.626 m)  Wt 150 lb (68.04 kg)  BMI 25.73 kg/m2  SpO2 85%  Physical Exam 1735: Physical examination:  Nursing notes reviewed; Vital signs and O2 SAT reviewed;  Constitutional: Well developed, Well nourished, In no acute distress; Head:  Normocephalic, atraumatic; Eyes: EOMI, PERRL, No scleral icterus; ENMT: Mouth and pharynx normal, Mucous membranes dry; Neck: Supple, Full range of motion, No lymphadenopathy; Cardiovascular: Regular rate and rhythm, No gallop; Respiratory: Breath sounds diminished & equal bilaterally, scattered wheezes.  No audible wheezing.  Speaking in phrases. Mild tachypnea. Sitting upright; Chest: Nontender, Movement normal; Abdomen: Soft, Nontender, Nondistended, Normal bowel sounds; Genitourinary: No CVA tenderness; Extremities: Pulses normal, No tenderness, No edema, No calf edema or asymmetry.; Neuro: AA&Ox3, Major CN grossly intact.  Speech clear. No gross focal motor or sensory deficits in extremities.; Skin: Color normal, Warm, Dry, +blanching erythemetous rash to face and neck as well as bilat hands to wrists (glove-like distribution), no bullae, no blisters, no petechiae.    ED Course  Procedures     MDM  MDM Reviewed: nursing note, vitals and previous chart Reviewed previous: ECG and labs Interpretation: ECG, labs and x-ray    Date: 01/28/2013  Rate: 66  Rhythm: normal sinus rhythm  QRS Axis: normal  Intervals: normal  ST/T Wave abnormalities: nonspecific ST/T changes  Conduction Disutrbances:none  Narrative Interpretation:   Old EKG Reviewed: unchanged; no significant changes from previous EKG dated 10/14/2009.  Results for orders placed during the hospital encounter of 01/28/13  CBC WITH DIFFERENTIAL      Result Value Range   WBC 8.1  4.0 - 10.5 K/uL   RBC 4.98  3.87 - 5.11 MIL/uL   Hemoglobin 14.5  12.0 - 15.0 g/dL   HCT 16.1  09.6 - 04.5 %   MCV 85.1  78.0 - 100.0 fL   MCH 29.1  26.0 - 34.0 pg   MCHC 34.2  30.0 - 36.0 g/dL   RDW 40.9  81.1 - 91.4 %   Platelets 266  150 - 400 K/uL   Neutrophils Relative 52  43 - 77 %   Neutro Abs 4.2  1.7 - 7.7 K/uL   Lymphocytes Relative 31  12 - 46 %   Lymphs Abs 2.5  0.7 - 4.0 K/uL   Monocytes Relative 10  3 - 12 %   Monocytes Absolute 0.8  0.1 - 1.0 K/uL   Eosinophils Relative 7 (*) 0 - 5 %   Eosinophils Absolute 0.6  0.0 - 0.7 K/uL   Basophils Relative 1  0 - 1 %   Basophils Absolute 0.0  0.0 - 0.1 K/uL  BASIC METABOLIC PANEL      Result Value Range   Sodium 131 (*) 135 - 145 mEq/L   Potassium 3.3 (*) 3.5 - 5.1 mEq/L   Chloride 91 (*) 96 - 112  mEq/L   CO2 33 (*) 19 - 32 mEq/L   Glucose, Bld 124 (*) 70 - 99 mg/dL   BUN 10  6 - 23 mg/dL   Creatinine, Ser 7.82  0.50 - 1.10 mg/dL   Calcium 9.8  8.4 - 95.6 mg/dL   GFR calc non Af Amer 89 (*) >90 mL/min   GFR calc Af Amer >90  >90 mL/min  URINALYSIS, ROUTINE W REFLEX MICROSCOPIC  Result Value Range   Color, Urine YELLOW  YELLOW   APPearance CLOUDY (*) CLEAR   Specific Gravity, Urine 1.015  1.005 - 1.030   pH 6.0  5.0 - 8.0   Glucose, UA NEGATIVE  NEGATIVE mg/dL   Hgb urine dipstick NEGATIVE  NEGATIVE   Bilirubin Urine NEGATIVE  NEGATIVE   Ketones, ur NEGATIVE  NEGATIVE mg/dL   Protein, ur NEGATIVE  NEGATIVE mg/dL   Urobilinogen, UA 0.2  0.0 - 1.0 mg/dL   Nitrite NEGATIVE  NEGATIVE   Leukocytes, UA NEGATIVE  NEGATIVE  PRO B NATRIURETIC PEPTIDE      Result Value Range   Pro B Natriuretic peptide (BNP) 75.5  0 - 450 pg/mL  TROPONIN I      Result Value Range   Troponin I <0.30  <0.30 ng/mL   Dg Chest 2 View 01/28/2013  *RADIOLOGY REPORT*  Clinical Data: Cough.  CHF.  CHEST - 2 VIEW  Comparison: 10/13/2009  Findings: Moderate cardiomegaly.  Pulmonary vasculature is within normal limits.  Interstitial prominence and markedly increased AP diameter of the chest are likely related to COPD.  No pneumothorax.  Innumerable mid and lower thoracic compression deformities with osteopenia are noted of indeterminate age.  No prior studies are available for comparison.  Patchy density at the posterior lung base seen on the lateral view only is nonspecific.  Underlying airspace disease or parenchymal nodule are not excluded.  IMPRESSION: Cardiomegaly without decompensation.  Changes related to COPD.  Multiple thoracic compression deformities. Per CMS PQRS reporting requirements (PQRS Measure 24): Given the patient's age of greater than 50 and the fracture site (hip, distal radius, or spine), the patient should be tested for osteoporosis using DXA, and the appropriate treatment considered based on  the DXA results.  Focal opacity at the posterior lung base seen on the lateral view. Follow-up studies until resolution are recommended.  If abnormality fails to resolve, CT can be performed to rule out mass.   Original Report Authenticated By: Jolaine Click, M.D.     2045:  Pt given neb and IV solumedrol.  Pt ambulated, Sats dropped to 85% R/A, and c/o increasing SOB.  Appears tachypneic, sitting off the side of stretcher, lungs diminished, speaking in phrases, +access mm use.  Will dose another neb, tx for CAP, admit.  Dx and testing d/w pt and family.  Questions answered.  Verb understanding, agreeable to admit.  T/C to [facility] Dr. Phillips Odor, case discussed, including:  HPI, pertinent PM/SHx, VS/PE, dx testing, ED course and treatment:  Agreeable to observation admit, requests she will come to ED for eval.            Laray Anger, DO 01/30/13 1317

## 2013-01-28 NOTE — ED Notes (Signed)
Respiratory at bedside.

## 2013-01-29 ENCOUNTER — Observation Stay (HOSPITAL_COMMUNITY): Payer: Medicare Other

## 2013-01-29 DIAGNOSIS — I509 Heart failure, unspecified: Secondary | ICD-10-CM

## 2013-01-29 DIAGNOSIS — M810 Age-related osteoporosis without current pathological fracture: Secondary | ICD-10-CM | POA: Diagnosis present

## 2013-01-29 DIAGNOSIS — J438 Other emphysema: Secondary | ICD-10-CM | POA: Diagnosis not present

## 2013-01-29 DIAGNOSIS — R0609 Other forms of dyspnea: Secondary | ICD-10-CM

## 2013-01-29 DIAGNOSIS — E876 Hypokalemia: Secondary | ICD-10-CM | POA: Diagnosis present

## 2013-01-29 LAB — TROPONIN I
Troponin I: <0.3 ng/mL (ref <0.30)
Troponin I: <0.3 ng/mL (ref <0.30)

## 2013-01-29 MED ORDER — ENOXAPARIN SODIUM 40 MG/0.4ML ~~LOC~~ SOLN
40.0000 mg | SUBCUTANEOUS | Status: DC
  Administered 2013-01-29 – 2013-01-30 (×2): 40 mg via SUBCUTANEOUS
  Filled 2013-01-29 (×2): qty 0.4

## 2013-01-29 MED ORDER — METHYLPREDNISOLONE SODIUM SUCC 125 MG IJ SOLR
60.0000 mg | Freq: Two times a day (BID) | INTRAMUSCULAR | Status: DC
  Administered 2013-01-29 – 2013-01-30 (×3): 60 mg via INTRAVENOUS
  Filled 2013-01-29 (×3): qty 2

## 2013-01-29 MED ORDER — POTASSIUM CHLORIDE CRYS ER 20 MEQ PO TBCR
40.0000 meq | EXTENDED_RELEASE_TABLET | Freq: Once | ORAL | Status: AC
  Administered 2013-01-29: 40 meq via ORAL
  Filled 2013-01-29: qty 2

## 2013-01-29 MED ORDER — IOHEXOL 300 MG/ML  SOLN
80.0000 mL | Freq: Once | INTRAMUSCULAR | Status: AC | PRN
  Administered 2013-01-29: 80 mL via INTRAVENOUS

## 2013-01-29 MED ORDER — MAGNESIUM SULFATE 40 MG/ML IJ SOLN
2.0000 g | Freq: Once | INTRAMUSCULAR | Status: AC
  Administered 2013-01-29: 2 g via INTRAVENOUS
  Filled 2013-01-29: qty 50

## 2013-01-29 NOTE — Progress Notes (Signed)
01/29/13 1327 Offered to assist patient up to chair this morning, stated felt weak and did not feel up to getting out of bed. Turns and repositions self frequently. Earnstine Regal, RN

## 2013-01-29 NOTE — H&P (Signed)
[facility] History and Physical  Tiffany Maxwell QMV:[id number] DOB: 1934/10/13 DOA: 01/28/2013  Referring physician: EDP Mcmanus PCP: No primary provider on file.  Specialists: none  Chief Complaint:  Dyspnea, Rash  HPI: Tiffany Maxwell is a 79 y.o. female with hypertension, hyperlipidemia, osteoporosis, coronary artery disease and moderately severe COPD who is seen today at her primary care physician's office and found to have oxygen saturations in the 80s on room air as well as a new rash. The patient states that she has had one to 2 days and worsening shortness of breath and cough. The rash is a red thickened rash, the rash itches, does not appear acute, head and hands only, very distinct distribution, she says is started 2 weeks ago after Benicar but I am not certain if she is an accurate historian. She volunteers that she is highly allergic to medicines and that NSAIDS also give her a rash. The rash is fixed and is unchanged over the past few days.  Hypoxia persisted in in ED, and CXR with RLL infiltrate.  Review of Systems: The patient denies anorexia, fever, weight loss,, vision loss, decreased hearing, hoarseness, chest pain, syncope, peripheral edema, balance deficits, hemoptysis, abdominal pain, melena, hematochezia, severe indigestion/heartburn, hematuria, incontinence, genital sores, muscle weakness,  transient blindness, difficulty walking, depression, unusual weight change, abnormal bleeding, enlarged lymph nodes, angioedema, and breast masses.   Past Medical History  Diagnosis Date  . Osteoporosis   . Palpitations   . PAC (premature atrial contraction)   . Seborrheic keratosis   . Hypertension   . Hyperlipidemia   . COPD (chronic obstructive pulmonary disease)   . MI (myocardial infarction)    Past Surgical History  Procedure Laterality Date  . Tubal ligation     Social History:  reports that she quit smoking about 6 months ago. Her smoking use included  Cigarettes. She has a 40 pack-year smoking history. She has never used smokeless tobacco. She reports that she does not drink alcohol or use illicit drugs.   Allergies  Allergen Reactions  . Codeine Other (See Comments)    headache  . Naproxen Itching, Rash and Other (See Comments)    Redness/flushing of skin    History reviewed. No pertinent family history. Denies history of cancer.  Prior to Admission medications   Medication Sig Start Date End Date Taking? Authorizing Provider  acetaminophen (TYLENOL) 500 MG tablet Take 500 mg by mouth every 5 (five) hours as needed for pain.   Yes Historical Provider, MD  albuterol-ipratropium (COMBIVENT) 18-103 MCG/ACT inhaler Inhale 2 puffs into the lungs 4 (four) times daily.   Yes Historical Provider, MD  aspirin EC 81 MG tablet Take 81 mg by mouth every morning.   Yes Historical Provider, MD  diphenhydrAMINE-zinc acetate (BENADRYL) cream Apply 1 application topically daily as needed for itching.   Yes Historical Provider, MD  olmesartan-hydrochlorothiazide (BENICAR HCT) 40-25 MG per tablet Take 1 tablet by mouth every morning.   Yes Historical Provider, MD   Physical Exam: Filed Vitals:   01/28/13 2143 01/28/13 2151 01/28/13 2245 01/29/13 0503  BP: 118/78  149/71 130/71  Pulse: 76  84 86  Temp:   97.8 F (36.6 C) 98.5 F (36.9 C)  TempSrc:      Resp: 20  20 20   Height:      Weight:      SpO2: 96% 96% 95% 92%   General appearance: NAD, conversant, slightly nervous/tremulous, cant sit still in bed. Eyes: anicteric sclerae, moist  conjunctivae; no lid-lag; PERRLA HENT: Atraumatic; oropharynx clear with moist mucous membranes and no mucosal ulcerations; normal hard and soft palate Neck: Trachea midline; FROM, supple, no thyromegaly or lymphadenopathy Lungs: Crackles in bases, scattered wheezes CV: RRR, no MRGs  Abdomen: Soft, non-tender; no masses or HSM Extremities: No peripheral edema or extremity lymphadenopathy Skin: Erythroderma  rash over face, upper chest and upper back and on bilateral hands - slightly itchy, dry thick palpable -hyperkeratotic, fissured skin on the palmar and lateral aspects of the fingers Psych: Nervous, alert and oriented to person, place and time Neuro: non-focal    Labs on Admission:  Basic Metabolic Panel:  Recent Labs Lab 01/28/13 1813 01/28/13 2310  NA 131*  --   K 3.3*  --   CL 91*  --   CO2 33*  --   GLUCOSE 124*  --   BUN 10  --   CREATININE 0.55  --   CALCIUM 9.8  --   MG  --  1.8  CBC:  Recent Labs Lab 01/28/13 1813  WBC 8.1  NEUTROABS 4.2  HGB 14.5  HCT 42.4  MCV 85.1  PLT 266   Cardiac Enzymes:  Recent Labs Lab 01/28/13 1813 01/28/13 2310  TROPONINI <0.30 <0.30    BNP (last 3 results)  Recent Labs  01/28/13 1813 01/28/13 2310  PROBNP 75.5 80.2   CBG: No results found for this basename: GLUCAP,  in the last 168 hours  Radiological Exams on Admission: Dg Chest 2 View  01/28/2013  *RADIOLOGY REPORT*  Clinical Data: Cough.  CHF.  CHEST - 2 VIEW  Comparison: 10/13/2009  Findings: Moderate cardiomegaly.  Pulmonary vasculature is within normal limits.  Interstitial prominence and markedly increased AP diameter of the chest are likely related to COPD.  No pneumothorax.  Innumerable mid and lower thoracic compression deformities with osteopenia are noted of indeterminate age.  No prior studies are available for comparison.  Patchy density at the posterior lung base seen on the lateral view only is nonspecific.  Underlying airspace disease or parenchymal nodule are not excluded.  IMPRESSION: Cardiomegaly without decompensation.  Changes related to COPD.  Multiple thoracic compression deformities. Per CMS PQRS reporting requirements (PQRS Measure 24): Given the patient's age of greater than 50 and the fracture site (hip, distal radius, or spine), the patient should be tested for osteoporosis using DXA, and the appropriate treatment considered based on the DXA  results.  Focal opacity at the posterior lung base seen on the lateral view. Follow-up studies until resolution are recommended.  If abnormality fails to resolve, CT can be performed to rule out mass.   Original Report Authenticated By: Jolaine Click, M.D.     EKG:  Assessment/Plan Active Problems:   COPD exacerbation   Compression fracture   Hypoxemia   HTN (hypertension)   Rash   CAP (community acquired pneumonia)   1. COPD exacerbation, possible superimposed early community acquired pneumonia with a focal opacity lateral view on chest x-ray.  Empiric antibiotic IV Levaquin, IV Solu-Medrol, scheduled and when necessary nebulizers, bronchodilators.   Supplemental oxygen and O2 sat monitoring  BNP to r/o CHF, may need echo  2.  Concern for possible malignancy:  The patient has a suspicious lesion on her chest x-ray that shows on lateral film, she does not have an elevated white count or fever to suggest a pneumonia -therefore with her heavy smoking history she is a high risk for malignancy- I have ordered a CT scan with contrast for this  morning. She also has a rash that is highly suggestive of dermatomyositis.  3. Compression fractures, she does not specifically complain of pain but she appears uncomfortable, frequently changes her position in bed.   Calcium, Vitamin D and  Bisphosphonate needed   Disposition: May be able to d/c today on outpatient steroids and abx, pending Chest CT.  Time spent: 70 minutes  [facility] Pager [phone]  If 7PM-7AM, please contact night-coverage [email or web address] Password [facility] 01/29/2013, 6:34 AM

## 2013-01-29 NOTE — Progress Notes (Signed)
[facility]                                                                                Patient Demographics  Tiffany Maxwell, is a 79 y.o. female, DOB - 16-Oct-1934, HQI:[id number], XLK:[id number]  Admit date - 01/28/2013  Admitting Physician Edsel Petrin, DO  Outpatient Primary MD for the patient is No primary provider on file.  LOS - 1   Chief Complaint  Patient presents with  . Allergic Reaction        Assessment & Plan    1. COPD exacerbation, possible superimposed early community acquired pneumonia with a focal opacity lateral view on chest x-ray, clinically better agree with Empiric antibiotic IV Levaquin, will reduce IV Solu-Medrol dose, scheduled and when necessary nebulizers, bronchodilators, try to titrate off oxygen, stable BNP , counseled To abstain from smoking.     2. Erythematous rash on face and arms, unclear etiology, CT chest unremarkable i.e. no under lying lung malignancy, rash seems to be present the last 2-3 weeks, I do not see any acute issue could be followed up outpatient with dermatologist.    3. Compression fractures, she does not specifically complain of pain but she appears uncomfortable, frequently changes her position in bed. Calcium, Vitamin D and Bisphosphonate if needed, outpatient followup with PCP.    4. Low potassium replaced recheck in the morning.    Code Status: Home  Family Communication: patient  Disposition Plan: home   Procedures CT scan chest   Consults      DVT Prophylaxis  Lovenox   Lab Results  Component Value Date   PLT 266 01/28/2013    Medications  Scheduled Meds: . albuterol  2.5 mg Nebulization Q6H  . aspirin EC  325 mg Oral Daily  . enoxaparin (LOVENOX) injection  40 mg Subcutaneous Q24H  . ipratropium  0.5 mg Nebulization Q6H  . levofloxacin (LEVAQUIN) IV  750 mg Intravenous Q24H  . methylPREDNISolone (SOLU-MEDROL) injection  60 mg Intravenous Q12H  . senna  1 tablet Oral  BID  . sodium chloride  3 mL Intravenous Q12H   Continuous Infusions:  PRN Meds:.sodium chloride, acetaminophen, acetaminophen, albuterol, alum & mag hydroxide-simeth, HYDROcodone-acetaminophen, ondansetron (ZOFRAN) IV, ondansetron, sodium chloride, zolpidem  Antibiotics    Anti-infectives   Start     Dose/Rate Route Frequency Ordered Stop   01/29/13 2200  levofloxacin (LEVAQUIN) IVPB 750 mg     750 mg 100 mL/hr over 90 Minutes Intravenous Every 24 hours 01/28/13 2307     01/28/13 2100  levofloxacin (LEVAQUIN) IVPB 750 mg     750 mg 100 mL/hr over 90 Minutes Intravenous  Once 01/28/13 2056 01/28/13 2358       Time Spent in minutes  35   SINGH,PRASHANT K M.D on 01/29/2013 at 9:26 AM  Between 7am to 7pm - Pager - [phone]  After 7pm go to [email or web address] - password [email or web address]  And look for the night coverage person covering for me after hours  [facility] Office  [phone]    Subjective:   Tiffany Maxwell today has, No headache, No chest pain, No abdominal pain - No Nausea, No new weakness tingling or numbness,  of cough and shortness of breath  Objective:   Filed Vitals:   01/28/13 2245 01/29/13 0503 01/29/13 0731 01/29/13 0756  BP: 149/71 130/71  128/70  Pulse: 84 86  81  Temp: 97.8 F (36.6 C) 98.5 F (36.9 C)    TempSrc:      Resp: 20 20  20   Height:      Weight:      SpO2: 95% 92% 95% 96%    Wt Readings from Last 3 Encounters:  01/28/13 68.04 kg (150 lb)     Intake/Output Summary (Last 24 hours) at 01/29/13 0926 Last data filed at 01/29/13 0500  Gross per 24 hour  Intake      0 ml  Output    450 ml  Net   -450 ml    Exam Awake Alert, Oriented X 3, No new F.N deficits, Normal affect [place].AT,PERRAL Supple Neck,No JVD, No cervical lymphadenopathy appriciated.  Symmetrical Chest wall movement, Good air movement bilaterally, minimal bilateral wheezing RRR,No Gallops,Rubs or new Murmurs, No Parasternal Heave +ve B.Sounds, Abd Soft, Non  tender, No organomegaly appriciated, No rebound - guarding or rigidity. No Cyanosis, Clubbing or edema, No new Rash or bruise , 72-week-old erythematous rash on face and arms   Data Review   Micro Results No results found for this or any previous visit (from the past 240 hour(s)).  Radiology Reports Dg Chest 2 View  01/28/2013  *RADIOLOGY REPORT*  Clinical Data: Cough.  CHF.  CHEST - 2 VIEW  Comparison: 10/13/2009  Findings: Moderate cardiomegaly.  Pulmonary vasculature is within normal limits.  Interstitial prominence and markedly increased AP diameter of the chest are likely related to COPD.  No pneumothorax.  Innumerable mid and lower thoracic compression deformities with osteopenia are noted of indeterminate age.  No prior studies are available for comparison.  Patchy density at the posterior lung base seen on the lateral view only is nonspecific.  Underlying airspace disease or parenchymal nodule are not excluded.  IMPRESSION: Cardiomegaly without decompensation.  Changes related to COPD.  Multiple thoracic compression deformities. Per CMS PQRS reporting requirements (PQRS Measure 24): Given the patient's age of greater than 50 and the fracture site (hip, distal radius, or spine), the patient should be tested for osteoporosis using DXA, and the appropriate treatment considered based on the DXA results.  Focal opacity at the posterior lung base seen on the lateral view. Follow-up studies until resolution are recommended.  If abnormality fails to resolve, CT can be performed to rule out mass.   Original Report Authenticated By: Jolaine Click, M.D.    Ct Chest W Contrast  01/29/2013  *RADIOLOGY REPORT*  Clinical Data: Evaluate for malignancy.  History of dermatomyositis.  Hypoxia.  CT CHEST WITH CONTRAST  Technique:  Multidetector CT imaging of the chest was performed following the standard protocol during bolus administration of intravenous contrast.  Contrast: 80mL OMNIPAQUE IOHEXOL 300 MG/ML  SOLN   Comparison: No priors.  Findings:  Mediastinum: Heart size is normal. There is no significant pericardial fluid, thickening or pericardial calcification. There is atherosclerosis of the thoracic aorta, the great vessels of the mediastinum and the coronary arteries, including calcified atherosclerotic plaque in the left anterior descending, left circumflex and right coronary arteries. No pathologically enlarged mediastinal or hilar lymph nodes. A small hiatal hernia.  Lungs/Pleura: Study is limited by a large amount of patient respiratory motion which limits the sensitivity for detection of small pulmonary nodules.  With this limitation in mind, there are no suspicious  appearing pulmonary nodules or masses identified. There is an opacity in the periphery of the right lower lobe inferiorly which is favored to represent an area of volume loss and probable chronic scarring. There is a background of mild diffuse bronchial wall thickening with very mild centrilobular paraseptal emphysema.  Mild bilateral apical pleuroparenchymal thickening favored to reflect chronic scarring.  Upper Abdomen: Calcified gallstone in the gallbladder.  Gallbladder is incompletely visualized, but there are no overt findings to suggest acute cholecystitis at this time.  Adreniform thickening of the adrenal glands bilaterally.  Calcification in the lateral limb of the left adrenal gland could relate to prior infection or infarction.  Musculoskeletal: There are multiple vertebral body compression fractures involving T7, T8 and T9.  At T7 there is approximately 40% loss of anterior vertebral body height, at T8 there is approximately 50% loss of anterior vertebral body height and 10% loss of posterior vertebral body height, and at T9 there is approximately 30% loss of anterior vertebral body height and 10% loss of posterior vertebral body height.  There is also compression of the superior endplate of T11 anteriorly with approximately 20% loss of  height. There are no aggressive appearing lytic or blastic lesions noted in the visualized portions of the skeleton.  IMPRESSION:  1.  Although the examination is slightly limited by extensive patient respiratory motion, there are no suspicious appearing pulmonary nodules or masses. 2.  Opacity in the periphery of the right lung base has an appearance compatible with volume loss and probable chronic scarring. 3. Atherosclerosis, including three-vessel coronary artery disease. Assessment for potential risk factor modification, dietary therapy or pharmacologic therapy may be warranted, if clinically indicated. 4.  Small hiatal hernia. 5.  Cholelithiasis. 6.  Mild diffuse bronchial wall thickening with mild centrilobular and paraseptal emphysema. 7.  Additional incidental findings, as above.   Original Report Authenticated By: Trudie Reed, M.D.     CBC  Recent Labs Lab 01/28/13 1813  WBC 8.1  HGB 14.5  HCT 42.4  PLT 266  MCV 85.1  MCH 29.1  MCHC 34.2  RDW 12.8  LYMPHSABS 2.5  MONOABS 0.8  EOSABS 0.6  BASOSABS 0.0    Chemistries   Recent Labs Lab 01/28/13 1813 01/28/13 2310  NA 131*  --   K 3.3*  --   CL 91*  --   CO2 33*  --   GLUCOSE 124*  --   BUN 10  --   CREATININE 0.55  --   CALCIUM 9.8  --   MG  --  1.8   ------------------------------------------------------------------------------------------------------------------ estimated creatinine clearance is 56.7 ml/min (by C-G formula based on Cr of 0.55). ------------------------------------------------------------------------------------------------------------------ No results found for this basename: HGBA1C,  in the last 72 hours ------------------------------------------------------------------------------------------------------------------ No results found for this basename: CHOL, HDL, LDLCALC, TRIG, CHOLHDL, LDLDIRECT,  in the last 72  hours ------------------------------------------------------------------------------------------------------------------ No results found for this basename: TSH, T4TOTAL, FREET3, T3FREE, THYROIDAB,  in the last 72 hours ------------------------------------------------------------------------------------------------------------------ No results found for this basename: VITAMINB12, FOLATE, FERRITIN, TIBC, IRON, RETICCTPCT,  in the last 72 hours  Coagulation profile No results found for this basename: INR, PROTIME,  in the last 168 hours  No results found for this basename: DDIMER,  in the last 72 hours  Cardiac Enzymes  Recent Labs Lab 01/28/13 1813 01/28/13 2310 01/29/13 0501  TROPONINI <0.30 <0.30 <0.30   ------------------------------------------------------------------------------------------------------------------ No components found with this basename: POCBNP,

## 2013-01-29 NOTE — Care Management Note (Addendum)
    Page 1 of 1   01/30/2013     2:04:17 PM   CARE MANAGEMENT NOTE 01/30/2013  Patient:  Tiffany Maxwell, Tiffany Maxwell   Account Number:  [id number]  Date Initiated:  01/29/2013  Documentation initiated by:  Rosemary Holms  Subjective/Objective Assessment:   Pt admitted from home with spouse. Per Pt, PCP is [facility]. No home O2.     Action/Plan:   DC with O2   Anticipated DC Date:  01/30/2013   Anticipated DC Plan:  HOME W HOME HEALTH SERVICES      DC Planning Services  CM consult      Choice offered to / List presented to:     DME arranged  [facility]      DME agency  [facility]        Status of service:  Completed, signed off Medicare Important Message given?  NA - LOS <3 / Initial given by admissions (If response is "NO", the following Medicare IM given date fields will be blank) Date Medicare IM given:   Date Additional Medicare IM given:    Discharge Disposition:  HOME W HOME HEALTH SERVICES  Per UR Regulation:    If discussed at Long Length of Stay Meetings, dates discussed:    Comments:  01/30/13 Rosemary Holms RN BSN CM [facility] notified of O2 need and will bring tank up to pt. Pt DC'd home with O2 due to COPD and dyspnea at rest and on exertion  01/29/13 Rosemary Holms RN BSN CM

## 2013-01-29 NOTE — Progress Notes (Signed)
*  PRELIMINARY RESULTS* Echocardiogram 2D Echocardiogram has been performed.  Tiffany Maxwell 01/29/2013, 4:41 PM

## 2013-01-29 NOTE — Progress Notes (Signed)
UR Chart Review Completed  

## 2013-01-29 NOTE — Progress Notes (Signed)
ANTIBIOTIC CONSULT NOTE - INITIAL  Pharmacy Consult for renal adjustment of meds:  Levaquin Indication: rule out pneumonia  Allergies  Allergen Reactions  . Codeine Other (See Comments)    headache  . Naproxen Itching, Rash and Other (See Comments)    Redness/flushing of skin    Patient Measurements: Height: 5\' 4"  (162.6 cm) Weight: 150 lb (68.04 kg) IBW/kg (Calculated) : 54.7  Vital Signs: Temp: 98.5 F (36.9 C) (02/19 0503) BP: 128/70 mmHg (02/19 0756) Pulse Rate: 81 (02/19 0756) Intake/Output from previous day: 02/18 0701 - 02/19 0700 In: -  Out: 450 [Urine:450] Intake/Output from this shift: Total I/O In: -  Out: 300 [Urine:300]  Labs:  Recent Labs  01/28/13 1813  WBC 8.1  HGB 14.5  PLT 266  CREATININE 0.55   Estimated Creatinine Clearance: 56.7 ml/min (by C-G formula based on Cr of 0.55). No results found for this basename: VANCOTROUGH, VANCOPEAK, VANCORANDOM, GENTTROUGH, GENTPEAK, GENTRANDOM, TOBRATROUGH, TOBRAPEAK, TOBRARND, AMIKACINPEAK, AMIKACINTROU, AMIKACIN,  in the last 72 hours   Microbiology: No results found for this or any previous visit (from the past 720 hour(s)).  Medical History: Past Medical History  Diagnosis Date  . Osteoporosis   . Palpitations   . PAC (premature atrial contraction)   . Seborrheic keratosis   . Hypertension   . Hyperlipidemia   . COPD (chronic obstructive pulmonary disease)   . MI (myocardial infarction)     Medications:  Scheduled:  . albuterol  2.5 mg Nebulization Q6H  . [COMPLETED] albuterol  5 mg Nebulization Once  . [COMPLETED] albuterol  5 mg Nebulization Once  . aspirin EC  325 mg Oral Daily  . enoxaparin (LOVENOX) injection  40 mg Subcutaneous Q24H  . [COMPLETED] ipratropium  0.5 mg Nebulization Once  . [COMPLETED] ipratropium  0.5 mg Nebulization Once  . ipratropium  0.5 mg Nebulization Q6H  . [COMPLETED] levofloxacin (LEVAQUIN) IV  750 mg Intravenous Once  . levofloxacin (LEVAQUIN) IV  750 mg  Intravenous Q24H  . [COMPLETED] magnesium sulfate 1 - 4 g bolus IVPB  2 g Intravenous Once  . [COMPLETED] methylPREDNISolone (SOLU-MEDROL) injection  125 mg Intravenous Once  . methylPREDNISolone (SOLU-MEDROL) injection  60 mg Intravenous BID  . [COMPLETED] potassium chloride  40 mEq Oral Once  . [COMPLETED] potassium chloride  40 mEq Oral Once  . senna  1 tablet Oral BID  . sodium chloride  3 mL Intravenous Q12H  . [DISCONTINUED] enoxaparin (LOVENOX) injection  30 mg Subcutaneous Q24H   Assessment: 79yo female with good renal fxn.  Estimated Creatinine Clearance: 56.7 ml/min (by C-G formula based on Cr of 0.55).  Started on Levaquin IV for possible superimposed pna.  Lovenox also adjusted based on renal fxn.  Goal of Therapy:  Eradicate infection.  Plan: Levaquin 750mg  IV q24hrs Lovenox 40mg  SQ q24hrs (vte prophylaxis) Monitor renal fxn and adjust meds if needed  Valrie Hart A 01/29/2013,10:36 AM

## 2013-01-30 ENCOUNTER — Encounter (HOSPITAL_COMMUNITY): Payer: Self-pay | Admitting: Internal Medicine

## 2013-01-30 DIAGNOSIS — S22009A Unspecified fracture of unspecified thoracic vertebra, initial encounter for closed fracture: Secondary | ICD-10-CM

## 2013-01-30 DIAGNOSIS — I5189 Other ill-defined heart diseases: Secondary | ICD-10-CM

## 2013-01-30 DIAGNOSIS — K8020 Calculus of gallbladder without cholecystitis without obstruction: Secondary | ICD-10-CM

## 2013-01-30 DIAGNOSIS — Z87891 Personal history of nicotine dependence: Secondary | ICD-10-CM

## 2013-01-30 DIAGNOSIS — J439 Emphysema, unspecified: Secondary | ICD-10-CM

## 2013-01-30 HISTORY — DX: Personal history of nicotine dependence: Z87.891

## 2013-01-30 HISTORY — DX: Calculus of gallbladder without cholecystitis without obstruction: K80.20

## 2013-01-30 HISTORY — DX: Other ill-defined heart diseases: I51.89

## 2013-01-30 HISTORY — DX: Emphysema, unspecified: J43.9

## 2013-01-30 HISTORY — DX: Unspecified fracture of unspecified thoracic vertebra, initial encounter for closed fracture: S22.009A

## 2013-01-30 LAB — URINE CULTURE

## 2013-01-30 LAB — POTASSIUM: Potassium: 4.5 mEq/L (ref 3.5–5.1)

## 2013-01-30 MED ORDER — CALCIUM-VITAMIN D 500-200 MG-UNIT PO TABS: 1.0000 | ORAL_TABLET | Freq: Two times a day (BID) | ORAL | Status: DC

## 2013-01-30 MED ORDER — LEVOFLOXACIN 500 MG PO TABS: 500.0000 mg | ORAL_TABLET | Freq: Every day | ORAL | Status: DC

## 2013-01-30 MED ORDER — PREDNISONE 10 MG PO TABS: ORAL_TABLET | ORAL | Status: DC

## 2013-01-30 NOTE — Progress Notes (Signed)
01/30/13 1540 Patient discharged home with family. Reviewed discharge instructions with patient, family at bedside. Given copy of instructions, medication list, prescriptions, f/u appointment set up. Noted which medications patient had received on instructions. Verbalized understanding of instructions. Home O2 delivered for discharge home, pt verbalizes understanding of O2 use. Discussed importance of no smoking around oxygen for safety. Patient and family verbalized understanding. IV site d/c'd per NT, site within normal limits. notified CMT of telemetry d/c'd for discharge. Pt had no c/o pain or shortness of breath at discharge. Left floor in stable condition via w/c accompanied by nurse tech. Earnstine Regal, RN

## 2013-01-30 NOTE — Plan of Care (Signed)
Problem: Phase I Progression Outcomes Goal: OOB as tolerated unless otherwise ordered Outcome: Completed/Met Date Met:  01/30/13 01/30/13 1537 Patient assisted up to chair this morning, tolerated well. Ambulated in hallway short distance this afternoon.

## 2013-01-30 NOTE — Progress Notes (Signed)
01/30/13 1425 Patient had O2 sat at 87-88% on r/a while resting, assisted patient to ambulate short distance in hallway, pt tolerated well, some c/o shortness of breath. O2 sats 81% after ambulation. O2 reapplied at 2 lpm. Pt stated felt better with O2 reapplied. Notified Dr Sherrie Mustache. Home O2 ordered and delivered per [facility] this afternoon. Earnstine Regal, RN

## 2013-01-30 NOTE — Progress Notes (Signed)
01/30/13 1423 Patient had O2 sat at 87% at rest on r/a. Earnstine Regal, RN

## 2013-01-30 NOTE — Discharge Summary (Addendum)
Physician Discharge Summary  Tiffany Maxwell UUV:[id number] DOB: [birth year] DOA: 01/28/2013  PCP: Bennie Pierini, FNP  Admit date: 01/28/2013 Discharge date: 01/30/2013  Time spent: Greater than 30 minutes  Recommendations for Outpatient Follow-up:  1. Consider outpatient referral for dermatological workup; deferred to primary care provider. 2. Recommend further management of osteoporosis and compression fractures. 3. Benicar/HCTZ was withheld at the time of discharge. Consider restarting at a lower dose at hospital followup appointment. Query if Benicar is the etiology of the rash. 4. Followup free T4 result, pending at the time of discharge.  Discharge Diagnoses:    1. COPD with emphysema and acute exacerbation with acute bronchitis and will acute respiratory failure with hypoxia. 2. History of tobacco abuse. She no longer smokes. 3. Multiple compression fractures of the thoracic spine and one compression fracture of the lumbar spine, likely secondary to osteoporosis. 4. Hypokalemia, supplemented and resolved. 5. Mild hyponatremia. 6. Subacute/chronic rash. Further evaluation and management will be deferred to  her primary care provider. 7. Incidental finding of cholelithiasis per CT of the chest. 8. Diastolic dysfunction, grade 1. Ejection fraction 65%. 9. Hypertension, with low-normal blood pressures during hospitalization. 10. Mild steroid-induced hyperglycemia. 11. Low TSH. Free T4 results pending at the time of discharge.  Discharge Condition: improved.  Diet recommendation: heart healthy.  Filed Weights   01/28/13 1634  Weight: 68.04 kg (150 lb)    History of present illness:  The patient is a 79 year old woman with a history significant for osteoporosis, CAD, and COPD, who presented to the emergency department on 01/29/2013 with a chief complaint of shortness of breath and a rash. She had one or 2 days of worsening shortness of breath and a cough. The rash started 2  weeks ago which coincided with having started Benicar for treatment of hypertension. In the emergency department, she was afebrile and hemodynamically stable. Her oxygen saturation fell to 85% on room air but increased to 96% on 2 L of nasal cannula oxygen. Her lab data were significant for a serum sodium of 131, potassium of 3.3, and glucose of 124 (nonfasting). Her WBC was within normal limits. Her troponin I was within normal limits. Chest x-ray results were suggestive of a focal abnormality at the posterior lung base and multiple thoracic compression deformities. She was admitted for further evaluation and management.  Hospital Course:  The patient was started on IV Levaquin and IV Solu-Medrol for COPD exacerbation and possible early superimposed community-acquired pneumonia. Oxygen was applied and titrated to keep her oxygen saturations greater than 90%. She had a history of tobacco abuse for many years, but she stopped smoking in July of 2013. For the compression fractures, she was started on calcium with vitamin D, but further management will be deferred to her primary care provider. The patient and her family were informed of the compression fractures. She voiced no complaints of upper back pain or chest pain. She had a distinctive macular rash that apparently started a few weeks ago. The rash appeared to have coincided with the start of Benicar for treatment of hypertension. She had no complaints of itching during hospitalization, which may have been attributed to the IV Solu-Medrol she was started on. Further evaluation and management will be deferred to her primary care provider. Potassium chloride was given orally for repletion. Her serum potassium improved to 4.5.  For followup evaluation, a number of studies were ordered. CT scan of her chest revealed a number of abnormalities including right lung base opacity with an appearance  compatible with chronic scarring, arthrosclerosis, cholelithiasis,  and mild diffuse bronchial wall thickening with centrilobular emphysema. Troponin I was negative x4. Her pro BNP was well within normal limits. Her 2-D echocardiogram revealed grade 1 diastolic dysfunction, but with an ejection fraction of 65%. Her magnesium level was within normal limits at 1.8. Her TSH was low at 0.311. Free T4 was ordered and the result was pending at the time of discharge.  The patient improved clinically and symptomatically. On room air, her oxygen saturation improved to 91%. She received 1-1/2 days of treatment with IV Solu-Medrol and Levaquin. She was discharged to home on 6 more days of oral Levaquin and a 6 day prednisone taper. She was instructed to continue Combivent inhaler 4 times daily and every 2 hours as needed. She was instructed to not take Benicar HCTZ until she was reevaluated by her primary care provider in one week as scheduled. She and her family voiced understanding.  Procedures: 2-D echocardiogram:Study Conclusions  Left ventricle: The cavity size was normal. Systolic function was vigorous. The estimated ejection fraction was in the range of 65% to 70%. Regional wall motion abnormalities cannot be excluded. Doppler parameters are consistent with abnormal left ventricular relaxation (grade 1 diastolic dysfunction). Transthoracic echocardiography. M-mode, complete 2D, spectral Doppler, and color Doppler. Height: Height: 162.6cm. Height: 64in. Weight: Weight: 68kg. Weight: 149.7lb. Body mass index: BMI: 25.7kg/m^2. Body surface area: BSA: 1.66m^2. Patient status: Inpatient. Location: Bedside.     Consultations:  none  Discharge Exam: Filed Vitals:   01/30/13 0511 01/30/13 0520 01/30/13 0521 01/30/13 0651  BP: 138/81 126/58 127/70   Pulse: 81 79 81   Temp: 97.6 F (36.4 C)     TempSrc:      Resp: 22     Height:      Weight:      SpO2: 93%   93%    General: Pleasant alert 79 year old Caucasian woman sitting up in a chair, in no acute  distress.  Cardiovascular: S1, S2, with no murmurs rubs or gallops.  Respiratory: occasional fine crackles, no wheezes, breathing nonlabored.   Discharge Instructions      Discharge Orders   Future Orders Complete By Expires     Diet - low sodium heart healthy  As directed     Diet - low sodium heart healthy  As directed     Discharge instructions  As directed     Comments:      Take your medications as prescribed. Discuss further management of your osteoporosis and compression fractures with your primary care provider.    Discharge instructions  As directed     Comments:      Stop Benicar/HCTZ until you are reevaluated by your primary care provider because of your low-normal blood pressure during the hospitalization.    Increase activity slowly  As directed     Increase activity slowly  As directed         Medication List    STOP taking these medications       olmesartan-hydrochlorothiazide 40-25 MG per tablet  Commonly known as:  BENICAR HCT      TAKE these medications       acetaminophen 500 MG tablet  Commonly known as:  TYLENOL  Take 500 mg by mouth every 5 (five) hours as needed for pain.     aspirin EC 81 MG tablet  Take 81 mg by mouth every morning.     calcium-vitamin D 500-200 MG-UNIT per tablet  Take 1 tablet  by mouth 2 (two) times daily with a meal.     COMBIVENT 18-103 MCG/ACT inhaler  Generic drug:  albuterol-ipratropium  Inhale 2 puffs into the lungs 4 (four) times daily.     diphenhydrAMINE-zinc acetate cream  Commonly known as:  BENADRYL  Apply 1 application topically daily as needed for itching.     levofloxacin 500 MG tablet  Commonly known as:  LEVAQUIN  Take 1 tablet (500 mg total) by mouth daily. Be taken for 6 more days     predniSONE 10 MG tablet  Commonly known as:  DELTASONE  Starting tomorrow, take 6 tablets for 1 day; then 5 tablets the next day; then 4 tablets the next day; then 3 tablets the next day; then 2 tablets the next day;  then 1 tablet the next day; then stop.       Follow-up Information   Follow up with Bennie Pierini, FNP On 02/06/2013. (10:30 am)    Contact information:   [address] [place] [address] [phone]        The results of significant diagnostics from this hospitalization (including imaging, microbiology, ancillary and laboratory) are listed below for reference.    Significant Diagnostic Studies: Dg Chest 2 View  01/28/2013  *RADIOLOGY REPORT*  Clinical Data: Cough.  CHF.  CHEST - 2 VIEW  Comparison: 10/13/2009  Findings: Moderate cardiomegaly.  Pulmonary vasculature is within normal limits.  Interstitial prominence and markedly increased AP diameter of the chest are likely related to COPD.  No pneumothorax.  Innumerable mid and lower thoracic compression deformities with osteopenia are noted of indeterminate age.  No prior studies are available for comparison.  Patchy density at the posterior lung base seen on the lateral view only is nonspecific.  Underlying airspace disease or parenchymal nodule are not excluded.  IMPRESSION: Cardiomegaly without decompensation.  Changes related to COPD.  Multiple thoracic compression deformities. Per CMS PQRS reporting requirements (PQRS Measure 24): Given the patient's age of greater than 50 and the fracture site (hip, distal radius, or spine), the patient should be tested for osteoporosis using DXA, and the appropriate treatment considered based on the DXA results.  Focal opacity at the posterior lung base seen on the lateral view. Follow-up studies until resolution are recommended.  If abnormality fails to resolve, CT can be performed to rule out mass.   Original Report Authenticated By: Jolaine Click, M.D.    Ct Chest W Contrast  01/29/2013  *RADIOLOGY REPORT*  Clinical Data: Evaluate for malignancy.  History of dermatomyositis.  Hypoxia.  CT CHEST WITH CONTRAST  Technique:  Multidetector CT imaging of the chest was performed following the  standard protocol during bolus administration of intravenous contrast.  Contrast: 80mL OMNIPAQUE IOHEXOL 300 MG/ML  SOLN  Comparison: No priors.  Findings:  Mediastinum: Heart size is normal. There is no significant pericardial fluid, thickening or pericardial calcification. There is atherosclerosis of the thoracic aorta, the great vessels of the mediastinum and the coronary arteries, including calcified atherosclerotic plaque in the left anterior descending, left circumflex and right coronary arteries. No pathologically enlarged mediastinal or hilar lymph nodes. A small hiatal hernia.  Lungs/Pleura: Study is limited by a large amount of patient respiratory motion which limits the sensitivity for detection of small pulmonary nodules.  With this limitation in mind, there are no suspicious appearing pulmonary nodules or masses identified. There is an opacity in the periphery of the right lower lobe inferiorly which is favored to represent an area of volume loss  and probable chronic scarring. There is a background of mild diffuse bronchial wall thickening with very mild centrilobular paraseptal emphysema.  Mild bilateral apical pleuroparenchymal thickening favored to reflect chronic scarring.  Upper Abdomen: Calcified gallstone in the gallbladder.  Gallbladder is incompletely visualized, but there are no overt findings to suggest acute cholecystitis at this time.  Adreniform thickening of the adrenal glands bilaterally.  Calcification in the lateral limb of the left adrenal gland could relate to prior infection or infarction.  Musculoskeletal: There are multiple vertebral body compression fractures involving T7, T8 and T9.  At T7 there is approximately 40% loss of anterior vertebral body height, at T8 there is approximately 50% loss of anterior vertebral body height and 10% loss of posterior vertebral body height, and at T9 there is approximately 30% loss of anterior vertebral body height and 10% loss of posterior  vertebral body height.  There is also compression of the superior endplate of T11 anteriorly with approximately 20% loss of height. There are no aggressive appearing lytic or blastic lesions noted in the visualized portions of the skeleton.  IMPRESSION:  1.  Although the examination is slightly limited by extensive patient respiratory motion, there are no suspicious appearing pulmonary nodules or masses. 2.  Opacity in the periphery of the right lung base has an appearance compatible with volume loss and probable chronic scarring. 3. Atherosclerosis, including three-vessel coronary artery disease. Assessment for potential risk factor modification, dietary therapy or pharmacologic therapy may be warranted, if clinically indicated. 4.  Small hiatal hernia. 5.  Cholelithiasis. 6.  Mild diffuse bronchial wall thickening with mild centrilobular and paraseptal emphysema. 7.  Additional incidental findings, as above.   Original Report Authenticated By: Trudie Reed, M.D.     Microbiology: No results found for this or any previous visit (from the past 240 hour(s)).   Labs: Basic Metabolic Panel:  Recent Labs Lab 01/28/13 1813 01/28/13 2310 01/30/13 0458  NA 131*  --   --   K 3.3*  --  4.5  CL 91*  --   --   CO2 33*  --   --   GLUCOSE 124*  --   --   BUN 10  --   --   CREATININE 0.55  --   --   CALCIUM 9.8  --   --   MG  --  1.8  --    Liver Function Tests: No results found for this basename: AST, ALT, ALKPHOS, BILITOT, PROT, ALBUMIN,  in the last 168 hours No results found for this basename: LIPASE, AMYLASE,  in the last 168 hours No results found for this basename: AMMONIA,  in the last 168 hours CBC:  Recent Labs Lab 01/28/13 1813  WBC 8.1  NEUTROABS 4.2  HGB 14.5  HCT 42.4  MCV 85.1  PLT 266   Cardiac Enzymes:  Recent Labs Lab 01/28/13 1813 01/28/13 2310 01/29/13 0501 01/29/13 1046  TROPONINI <0.30 <0.30 <0.30 <0.30   BNP: BNP (last 3 results)  Recent Labs   01/28/13 1813 01/28/13 2310  PROBNP 75.5 80.2   CBG: No results found for this basename: GLUCAP,  in the last 168 hours     Signed:  Rella Egelston  [facility] 01/30/2013, 12:05 PM    Addendum:  Upon rechecking the patient's oxygen saturation prior to discharge, her oxygen saturation decreased to 87% on room air at rest and it fell to 81% following ambulation. For this reason, she was discharged on 2.5 L of nasal cannula oxygen.

## 2013-01-31 LAB — T4, FREE: Free T4: 1.61 ng/dL (ref 0.80–1.80)

## 2013-02-06 DIAGNOSIS — J189 Pneumonia, unspecified organism: Secondary | ICD-10-CM | POA: Diagnosis not present

## 2013-02-06 DIAGNOSIS — J441 Chronic obstructive pulmonary disease with (acute) exacerbation: Secondary | ICD-10-CM | POA: Diagnosis not present

## 2013-02-19 DIAGNOSIS — M810 Age-related osteoporosis without current pathological fracture: Secondary | ICD-10-CM | POA: Diagnosis not present

## 2013-02-25 ENCOUNTER — Other Ambulatory Visit: Payer: Self-pay | Admitting: *Deleted

## 2013-03-20 ENCOUNTER — Telehealth: Payer: Self-pay | Admitting: Nurse Practitioner

## 2013-03-20 NOTE — Telephone Encounter (Signed)
Sore and give out, breathing seems ok most of the time- apt made

## 2013-03-24 ENCOUNTER — Encounter: Payer: Self-pay | Admitting: Nurse Practitioner

## 2013-03-25 ENCOUNTER — Other Ambulatory Visit: Payer: Self-pay | Admitting: Nurse Practitioner

## 2013-03-27 ENCOUNTER — Encounter: Payer: Self-pay | Admitting: Nurse Practitioner

## 2013-03-27 ENCOUNTER — Ambulatory Visit (INDEPENDENT_AMBULATORY_CARE_PROVIDER_SITE_OTHER): Payer: Medicare Other | Admitting: Nurse Practitioner

## 2013-03-27 VITALS — BP 140/88 | HR 70 | Temp 97.4°F | Ht 60.0 in | Wt 133.0 lb

## 2013-03-27 DIAGNOSIS — J449 Chronic obstructive pulmonary disease, unspecified: Secondary | ICD-10-CM | POA: Diagnosis not present

## 2013-03-27 DIAGNOSIS — IMO0001 Reserved for inherently not codable concepts without codable children: Secondary | ICD-10-CM

## 2013-03-27 NOTE — Patient Instructions (Signed)

## 2013-03-27 NOTE — Progress Notes (Signed)
  Subjective:    Patient ID: Tiffany Maxwell, female    DOB: April 01, 1934, 79 y.o.   MRN: [id number]  HPI Pt presents today because she was placed on oxygen while she was in the hospital due to COPD exacerbation and wanted to know if she will have to be on oxygen long term. Patient says SOB not worse than usual but wants to come off O2.   Review of Systems  Constitutional: Negative.   HENT: Negative.   Eyes: Negative.   Respiratory: Positive for shortness of breath. Negative for cough and chest tightness.   Cardiovascular: Negative.   Gastrointestinal: Negative.   Neurological: Negative.   Psychiatric/Behavioral: Negative.        Objective:   Physical Exam  Constitutional: She is oriented to person, place, and time. She appears well-developed and well-nourished.  Cardiovascular: Normal rate, regular rhythm, normal heart sounds and intact distal pulses.   Pulmonary/Chest: Effort normal and breath sounds normal.  Decreased breath sounds bil bases  Neurological: She is alert and oriented to person, place, and time.  Skin: Skin is warm and dry.    BP 140/88  Pulse 70  Temp(Src) 97.4 F (36.3 C) (Oral)  Ht 5' (1.524 m)  Wt 133 lb (60.328 kg)  BMI 25.97 kg/m2  SpO2 96%       Assessment & Plan:  COPD  Doing well  Continue O2 via cannulla  Ok to take O2 with you and get out  F/U in 3 months Mary-Margaret Daphine Deutscher, FNP

## 2013-04-01 ENCOUNTER — Other Ambulatory Visit: Payer: Self-pay

## 2013-04-01 MED ORDER — LISINOPRIL 40 MG PO TABS: 40.0000 mg | ORAL_TABLET | Freq: Every day | ORAL | Status: DC

## 2013-04-28 ENCOUNTER — Encounter (HOSPITAL_COMMUNITY): Payer: Self-pay | Admitting: *Deleted

## 2013-04-28 ENCOUNTER — Inpatient Hospital Stay (HOSPITAL_COMMUNITY)
Admission: EM | Admit: 2013-04-28 | Discharge: 2013-05-02 | DRG: 481 | Disposition: A | Discharge status: Skilled Nursing Facility | Payer: Medicare Other | Attending: Internal Medicine | Admitting: Internal Medicine

## 2013-04-28 ENCOUNTER — Emergency Department (HOSPITAL_COMMUNITY): Payer: Medicare Other

## 2013-04-28 DIAGNOSIS — I519 Heart disease, unspecified: Secondary | ICD-10-CM | POA: Diagnosis not present

## 2013-04-28 DIAGNOSIS — I10 Essential (primary) hypertension: Secondary | ICD-10-CM | POA: Diagnosis not present

## 2013-04-28 DIAGNOSIS — E876 Hypokalemia: Secondary | ICD-10-CM | POA: Diagnosis not present

## 2013-04-28 DIAGNOSIS — S72002A Fracture of unspecified part of neck of left femur, initial encounter for closed fracture: Secondary | ICD-10-CM

## 2013-04-28 DIAGNOSIS — R21 Rash and other nonspecific skin eruption: Secondary | ICD-10-CM

## 2013-04-28 DIAGNOSIS — J441 Chronic obstructive pulmonary disease with (acute) exacerbation: Secondary | ICD-10-CM | POA: Diagnosis not present

## 2013-04-28 DIAGNOSIS — L821 Other seborrheic keratosis: Secondary | ICD-10-CM | POA: Diagnosis present

## 2013-04-28 DIAGNOSIS — J4489 Other specified chronic obstructive pulmonary disease: Secondary | ICD-10-CM | POA: Diagnosis present

## 2013-04-28 DIAGNOSIS — E785 Hyperlipidemia, unspecified: Secondary | ICD-10-CM | POA: Diagnosis present

## 2013-04-28 DIAGNOSIS — IMO0002 Reserved for concepts with insufficient information to code with codable children: Secondary | ICD-10-CM | POA: Diagnosis not present

## 2013-04-28 DIAGNOSIS — Z9981 Dependence on supplemental oxygen: Secondary | ICD-10-CM | POA: Diagnosis not present

## 2013-04-28 DIAGNOSIS — R52 Pain, unspecified: Secondary | ICD-10-CM | POA: Diagnosis not present

## 2013-04-28 DIAGNOSIS — R41841 Cognitive communication deficit: Secondary | ICD-10-CM | POA: Diagnosis not present

## 2013-04-28 DIAGNOSIS — R0902 Hypoxemia: Secondary | ICD-10-CM

## 2013-04-28 DIAGNOSIS — S72009A Fracture of unspecified part of neck of unspecified femur, initial encounter for closed fracture: Secondary | ICD-10-CM | POA: Diagnosis not present

## 2013-04-28 DIAGNOSIS — S72143A Displaced intertrochanteric fracture of unspecified femur, initial encounter for closed fracture: Principal | ICD-10-CM | POA: Diagnosis present

## 2013-04-28 DIAGNOSIS — D1739 Benign lipomatous neoplasm of skin and subcutaneous tissue of other sites: Secondary | ICD-10-CM | POA: Diagnosis not present

## 2013-04-28 DIAGNOSIS — D62 Acute posthemorrhagic anemia: Secondary | ICD-10-CM

## 2013-04-28 DIAGNOSIS — E871 Hypo-osmolality and hyponatremia: Secondary | ICD-10-CM | POA: Diagnosis present

## 2013-04-28 DIAGNOSIS — D72829 Elevated white blood cell count, unspecified: Secondary | ICD-10-CM

## 2013-04-28 DIAGNOSIS — M810 Age-related osteoporosis without current pathological fracture: Secondary | ICD-10-CM

## 2013-04-28 DIAGNOSIS — Z87891 Personal history of nicotine dependence: Secondary | ICD-10-CM

## 2013-04-28 DIAGNOSIS — J449 Chronic obstructive pulmonary disease, unspecified: Secondary | ICD-10-CM | POA: Diagnosis present

## 2013-04-28 DIAGNOSIS — Y92009 Unspecified place in unspecified non-institutional (private) residence as the place of occurrence of the external cause: Secondary | ICD-10-CM

## 2013-04-28 DIAGNOSIS — R0602 Shortness of breath: Secondary | ICD-10-CM | POA: Diagnosis not present

## 2013-04-28 DIAGNOSIS — R262 Difficulty in walking, not elsewhere classified: Secondary | ICD-10-CM | POA: Diagnosis not present

## 2013-04-28 DIAGNOSIS — J439 Emphysema, unspecified: Secondary | ICD-10-CM

## 2013-04-28 DIAGNOSIS — S72009D Fracture of unspecified part of neck of unspecified femur, subsequent encounter for closed fracture with routine healing: Secondary | ICD-10-CM | POA: Diagnosis not present

## 2013-04-28 DIAGNOSIS — M25559 Pain in unspecified hip: Secondary | ICD-10-CM | POA: Diagnosis not present

## 2013-04-28 DIAGNOSIS — W010XXA Fall on same level from slipping, tripping and stumbling without subsequent striking against object, initial encounter: Secondary | ICD-10-CM | POA: Diagnosis present

## 2013-04-28 DIAGNOSIS — I5189 Other ill-defined heart diseases: Secondary | ICD-10-CM

## 2013-04-28 DIAGNOSIS — K8020 Calculus of gallbladder without cholecystitis without obstruction: Secondary | ICD-10-CM

## 2013-04-28 DIAGNOSIS — I252 Old myocardial infarction: Secondary | ICD-10-CM

## 2013-04-28 DIAGNOSIS — S298XXA Other specified injuries of thorax, initial encounter: Secondary | ICD-10-CM | POA: Diagnosis not present

## 2013-04-28 DIAGNOSIS — M6281 Muscle weakness (generalized): Secondary | ICD-10-CM | POA: Diagnosis not present

## 2013-04-28 DIAGNOSIS — Z9181 History of falling: Secondary | ICD-10-CM | POA: Diagnosis not present

## 2013-04-28 DIAGNOSIS — J209 Acute bronchitis, unspecified: Secondary | ICD-10-CM

## 2013-04-28 DIAGNOSIS — R079 Chest pain, unspecified: Secondary | ICD-10-CM | POA: Diagnosis not present

## 2013-04-28 DIAGNOSIS — Z79899 Other long term (current) drug therapy: Secondary | ICD-10-CM | POA: Diagnosis not present

## 2013-04-28 LAB — CBC WITH DIFFERENTIAL/PLATELET
Basophils Absolute: 0 10*3/uL (ref 0.0–0.1)
Basophils Relative: 0 % (ref 0–1)
Eosinophils Absolute: 0 10*3/uL (ref 0.0–0.7)
Eosinophils Relative: 0 % (ref 0–5)
Lymphs Abs: 0.5 10*3/uL — ABNORMAL LOW (ref 0.7–4.0)
MCH: 28.8 pg (ref 26.0–34.0)
MCV: 86.3 fL (ref 78.0–100.0)
Neutrophils Relative %: 92 % — ABNORMAL HIGH (ref 43–77)
Platelets: 239 10*3/uL (ref 150–400)
RBC: 4.38 MIL/uL (ref 3.87–5.11)
RDW: 13 % (ref 11.5–15.5)
WBC: 13.5 10*3/uL — ABNORMAL HIGH (ref 4.0–10.5)

## 2013-04-28 LAB — PREPARE RBC (CROSSMATCH)

## 2013-04-28 LAB — COMPREHENSIVE METABOLIC PANEL
ALT: 6 U/L (ref 0–35)
AST: 16 U/L (ref 0–37)
Albumin: 3.2 g/dL — ABNORMAL LOW (ref 3.5–5.2)
Alkaline Phosphatase: 77 U/L (ref 39–117)
Calcium: 9.6 mg/dL (ref 8.4–10.5)
GFR calc Af Amer: >90 mL/min (ref >90)
Glucose, Bld: 143 mg/dL — ABNORMAL HIGH (ref 70–99)
Potassium: 3.5 mEq/L (ref 3.5–5.1)
Sodium: 135 mEq/L (ref 135–145)
Total Protein: 6 g/dL (ref 6.0–8.3)

## 2013-04-28 LAB — URINALYSIS, ROUTINE W REFLEX MICROSCOPIC
Bilirubin Urine: NEGATIVE
Nitrite: NEGATIVE
Protein, ur: NEGATIVE mg/dL
Specific Gravity, Urine: 1.01 (ref 1.005–1.030)
Urobilinogen, UA: 0.2 mg/dL (ref 0.0–1.0)

## 2013-04-28 LAB — SURGICAL PCR SCREEN: MRSA, PCR: NEGATIVE

## 2013-04-28 LAB — ABO/RH: ABO/RH(D): A POS

## 2013-04-28 MED ORDER — CEFAZOLIN SODIUM-DEXTROSE 2-3 GM-% IV SOLR
2.0000 g | INTRAVENOUS | Status: AC
  Administered 2013-04-29: 2 g via INTRAVENOUS

## 2013-04-28 MED ORDER — ACETAMINOPHEN 325 MG PO TABS: 650.0000 mg | ORAL_TABLET | Freq: Four times a day (QID) | ORAL | Status: DC | PRN

## 2013-04-28 MED ORDER — SODIUM CHLORIDE 0.9 % IV BOLUS (SEPSIS)
250.0000 mL | Freq: Once | INTRAVENOUS | Status: AC
  Administered 2013-04-28: 250 mL via INTRAVENOUS

## 2013-04-28 MED ORDER — POVIDONE-IODINE 10 % EX SOLN: Freq: Once | CUTANEOUS | Status: DC

## 2013-04-28 MED ORDER — OXYCODONE-ACETAMINOPHEN 5-325 MG PO TABS
1.0000 | ORAL_TABLET | ORAL | Status: DC | PRN
  Administered 2013-04-28 (×2): 1 via ORAL
  Filled 2013-04-28 (×2): qty 1

## 2013-04-28 MED ORDER — ENOXAPARIN SODIUM 40 MG/0.4ML ~~LOC~~ SOLN
40.0000 mg | SUBCUTANEOUS | Status: DC
  Administered 2013-04-28 – 2013-05-01 (×4): 40 mg via SUBCUTANEOUS
  Filled 2013-04-28 (×4): qty 0.4

## 2013-04-28 MED ORDER — ACETAMINOPHEN 650 MG RE SUPP: 650.0000 mg | Freq: Four times a day (QID) | RECTAL | Status: DC | PRN

## 2013-04-28 MED ORDER — HYDROMORPHONE HCL PF 1 MG/ML IJ SOLN: 0.5000 mg | INTRAMUSCULAR | Status: DC | PRN

## 2013-04-28 MED ORDER — ONDANSETRON HCL 4 MG/2ML IJ SOLN
4.0000 mg | Freq: Once | INTRAMUSCULAR | Status: AC
  Administered 2013-04-28: 4 mg via INTRAVENOUS
  Filled 2013-04-28: qty 2

## 2013-04-28 MED ORDER — SODIUM CHLORIDE 0.9 % IV SOLN: INTRAVENOUS | Status: DC

## 2013-04-28 MED ORDER — SODIUM CHLORIDE 0.9 % IV SOLN
INTRAVENOUS | Status: DC
  Administered 2013-04-28 – 2013-04-29 (×2): via INTRAVENOUS
  Administered 2013-04-30: 20 mL via INTRAVENOUS

## 2013-04-28 MED ORDER — ALBUTEROL SULFATE (5 MG/ML) 0.5% IN NEBU
2.5000 mg | INHALATION_SOLUTION | Freq: Four times a day (QID) | RESPIRATORY_TRACT | Status: DC
  Administered 2013-04-28 – 2013-05-02 (×16): 2.5 mg via RESPIRATORY_TRACT
  Filled 2013-04-28 (×17): qty 0.5

## 2013-04-28 MED ORDER — ALBUTEROL SULFATE HFA 108 (90 BASE) MCG/ACT IN AERS
2.0000 | INHALATION_SPRAY | RESPIRATORY_TRACT | Status: DC | PRN
  Administered 2013-04-28: 2 via RESPIRATORY_TRACT
  Filled 2013-04-28: qty 6.7

## 2013-04-28 MED ORDER — IPRATROPIUM BROMIDE 0.02 % IN SOLN
0.5000 mg | Freq: Four times a day (QID) | RESPIRATORY_TRACT | Status: DC
  Administered 2013-04-28 – 2013-05-02 (×16): 0.5 mg via RESPIRATORY_TRACT
  Filled 2013-04-28 (×16): qty 2.5

## 2013-04-28 MED ORDER — CEFAZOLIN SODIUM-DEXTROSE 2-3 GM-% IV SOLR: 2.0000 g | Freq: Once | INTRAVENOUS | Status: DC

## 2013-04-28 MED ORDER — HYDRALAZINE HCL 20 MG/ML IJ SOLN: 5.0000 mg | Freq: Four times a day (QID) | INTRAMUSCULAR | Status: DC | PRN

## 2013-04-28 MED ORDER — LISINOPRIL 10 MG PO TABS: 40.0000 mg | ORAL_TABLET | Freq: Every day | ORAL | Status: DC

## 2013-04-28 MED ORDER — HYDROMORPHONE HCL PF 1 MG/ML IJ SOLN
0.5000 mg | INTRAMUSCULAR | Status: DC | PRN
  Administered 2013-04-29: 0.5 mg via INTRAVENOUS
  Filled 2013-04-28: qty 1

## 2013-04-28 MED ORDER — ALBUTEROL SULFATE (5 MG/ML) 0.5% IN NEBU
2.5000 mg | INHALATION_SOLUTION | RESPIRATORY_TRACT | Status: DC | PRN
  Administered 2013-04-30: 2.5 mg via RESPIRATORY_TRACT
  Filled 2013-04-28: qty 0.5

## 2013-04-28 MED ORDER — ONDANSETRON HCL 4 MG/2ML IJ SOLN: 4.0000 mg | Freq: Three times a day (TID) | INTRAMUSCULAR | Status: DC | PRN

## 2013-04-28 MED ORDER — LISINOPRIL 10 MG PO TABS
40.0000 mg | ORAL_TABLET | Freq: Every day | ORAL | Status: DC
  Administered 2013-04-28 – 2013-05-02 (×5): 40 mg via ORAL
  Filled 2013-04-28 (×5): qty 4

## 2013-04-28 MED ORDER — HYDROMORPHONE HCL PF 1 MG/ML IJ SOLN
0.5000 mg | Freq: Once | INTRAMUSCULAR | Status: AC
  Administered 2013-04-28: 0.5 mg via INTRAVENOUS
  Filled 2013-04-28: qty 1

## 2013-04-28 MED ORDER — HYDRALAZINE HCL 20 MG/ML IJ SOLN
2.0000 mg | Freq: Once | INTRAMUSCULAR | Status: AC
  Administered 2013-04-28: 2 mg via INTRAVENOUS
  Filled 2013-04-28: qty 1

## 2013-04-28 MED ORDER — POVIDONE-IODINE 10 % EX SOLN: CUTANEOUS | Status: DC

## 2013-04-28 NOTE — Progress Notes (Signed)
PT Cancellation Note  Patient Details Name: Tiffany Maxwell MRN: [id number] DOB: 1934-11-12   Cancelled Treatment:    Reason Eval/Treat Not Completed: Other (comment) Pt is here with a new hip fx.  Dr. Hilda Lias plans to do surgery tomorrow per his report.  We will not see the pt. until after surgery, as ordered by Dr. Hilda Lias.  Thank you.  Please call me with any questions. [phone]).  Myrlene Broker L 04/28/2013, 1:28 PM

## 2013-04-28 NOTE — ED Provider Notes (Signed)
History     This chart was scribed for Shelda Jakes, MD, MD by Smitty Pluck, ED Scribe. The patient was seen in room APA14/AP[facility]14 and the patient's care was started at 8:57 PM.   CSN: [id number]  Arrival date & time 04/28/13  0802      Chief Complaint  Patient presents with  . Fall  . Hip Pain     Patient is a 79 y.o. female presenting with fall and hip pain. The history is provided by the patient and medical records. No language interpreter was used.  Fall The accident occurred 1 to 2 hours ago. The fall occurred while walking. She fell from a height of 1 to 2 ft. She landed on a hard floor. There was no blood loss. The pain is moderate. There was no entrapment after the fall. There was no drug use involved in the accident. There was no alcohol use involved in the accident. Pertinent negatives include no fever, no abdominal pain, no nausea, no vomiting and no headaches.  Hip Pain This is a new problem. The problem occurs constantly. The problem has not changed since onset.Associated symptoms include shortness of breath. Pertinent negatives include no abdominal pain and no headaches. She has tried nothing for the symptoms.  Fall Associated symptoms include shortness of breath. Pertinent negatives include no abdominal pain and no headaches.   HPI Comments: Tiffany Maxwell is a 79 y.o. female with hx of HTN, MI, COPD and PAC who presents to the Emergency Department complaining of fall today. She reports that she was walking to take trash out and fell today onto left side. She mentions having constant, moderate left hip pain onset after fall. Pt denies LOC, head injury, fever, chills, nausea, vomiting, diarrhea, weakness, cough, SOB and any other pain. Pt takes asa daily. Pt uses 3L of O2 at home.      PCP is Dr. Christell Constant Pt does not have orthopedist but has seen Dr. Mickie Bail   Past Medical History  Diagnosis Date  . Osteoporosis   . Palpitations   . PAC (premature atrial  contraction)   . Seborrheic keratosis   . Hypertension   . Hyperlipidemia   . COPD (chronic obstructive pulmonary disease)   . MI (myocardial infarction)   . Multiple fractures of thoracic spine, closed 01/30/2013  . Cholelithiasis 01/30/2013  . Pulmonary emphysema 01/30/2013  . History of tobacco use 01/30/2013  . Diastolic dysfunction 01/30/2013    Grade 1. Ejection fraction 65%.    Past Surgical History  Procedure Laterality Date  . Tubal ligation      History reviewed. No pertinent family history.  History  Substance Use Topics  . Smoking status: Former Smoker -- 1.00 packs/day for 40 years    Types: Cigarettes    Quit date: 07/10/2012  . Smokeless tobacco: Never Used  . Alcohol Use: No    OB History   Grav Para Term Preterm Abortions TAB SAB Ect Mult Living                  Review of Systems  Constitutional: Negative for fever and chills.  HENT: Negative for sore throat, rhinorrhea and neck pain.   Eyes: Negative for visual disturbance.  Respiratory: Positive for cough and shortness of breath.   Cardiovascular: Negative for leg swelling.  Gastrointestinal: Negative for nausea, vomiting, abdominal pain and diarrhea.  Genitourinary: Negative for dysuria.  Musculoskeletal: Positive for back pain and arthralgias.  Skin: Negative for rash.  Neurological: Negative  for syncope and headaches.  Hematological: Does not bruise/bleed easily.  Psychiatric/Behavioral: Negative for confusion.    Allergies  Codeine; Benicar hct; and Naproxen  Home Medications   No current outpatient prescriptions on file.  BP 122/74  Pulse 96  Temp(Src) 98.4 F (36.9 C) (Oral)  Resp 20  Ht 5' (1.524 m)  Wt 128 lb 12.8 oz (58.423 kg)  BMI 25.15 kg/m2  SpO2 94%  Physical Exam  Nursing note and vitals reviewed. Constitutional: She is oriented to person, place, and time. She appears well-developed and well-nourished. No distress.  HENT:  Head: Normocephalic and atraumatic.  Neck:  Normal range of motion. Neck supple.  Cardiovascular: Normal rate, regular rhythm and normal heart sounds.   No murmur heard. Cap refill 2 seconds in lower extremities Strong DP pulse +2   Pulmonary/Chest: Effort normal and breath sounds normal. No respiratory distress. She has no wheezes. She has no rales.  Abdominal: Soft. Bowel sounds are normal. She exhibits no distension. There is no tenderness. There is no rebound and no guarding.  Musculoskeletal: Normal range of motion. She exhibits no edema.  Neurological: She is alert and oriented to person, place, and time. No cranial nerve deficit.  Skin: Skin is warm and dry.  Psychiatric: She has a normal mood and affect. Her behavior is normal.    ED Course  Procedures (including critical care time) DIAGNOSTIC STUDIES:    COORDINATION OF CARE: 9:04 AM Discussed ED treatment with pt and pt agrees.     Labs Reviewed  CBC WITH DIFFERENTIAL - Abnormal; Notable for the following:    WBC 13.5 (*)    Neutrophils Relative % 92 (*)    Neutro Abs 12.4 (*)    Lymphocytes Relative 4 (*)    Lymphs Abs 0.5 (*)    All other components within normal limits  COMPREHENSIVE METABOLIC PANEL - Abnormal; Notable for the following:    Chloride 94 (*)    CO2 34 (*)    Glucose, Bld 143 (*)    BUN 4 (*)    Creatinine, Ser 0.42 (*)    Albumin 3.2 (*)    All other components within normal limits  CBC - Abnormal; Notable for the following:    RBC 3.47 (*)    Hemoglobin 10.0 (*)    HCT 29.8 (*)    All other components within normal limits  BASIC METABOLIC PANEL - Abnormal; Notable for the following:    Sodium 134 (*)    Potassium 3.4 (*)    Glucose, Bld 107 (*)    Creatinine, Ser 0.45 (*)    All other components within normal limits  BASIC METABOLIC PANEL - Abnormal; Notable for the following:    Sodium 134 (*)    Glucose, Bld 127 (*)    Creatinine, Ser 0.45 (*)    All other components within normal limits  CBC WITH DIFFERENTIAL - Abnormal;  Notable for the following:    RBC 2.50 (*)    Hemoglobin 7.6 (*)    HCT 21.5 (*)    Neutro Abs 8.0 (*)    Monocytes Absolute 1.2 (*)    All other components within normal limits  CBC WITH DIFFERENTIAL - Abnormal; Notable for the following:    RBC 3.17 (*)    Hemoglobin 9.4 (*)    HCT 26.9 (*)    Monocytes Absolute 1.2 (*)    All other components within normal limits  BASIC METABOLIC PANEL - Abnormal; Notable for the following:  Sodium 133 (*)    Potassium 3.4 (*)    Creatinine, Ser 0.39 (*)    All other components within normal limits  SURGICAL PCR SCREEN  URINALYSIS, ROUTINE W REFLEX MICROSCOPIC  CBC WITH DIFFERENTIAL  BASIC METABOLIC PANEL  PREPARE RBC (CROSSMATCH)  TYPE AND SCREEN  ABO/RH  SURGICAL PATHOLOGY   No results found.  Date: 04/28/2013  Rate: 66  Rhythm: normal sinus rhythm  QRS Axis: normal  Intervals: normal  ST/T Wave abnormalities: normal  Conduction Disutrbances:none  Narrative Interpretation:   Old EKG Reviewed: none available    1. Hip fracture requiring operative repair, left, closed, initial encounter   2. COPD exacerbation   3. Leukocytosis   4. Osteoporosis, unspecified   5. HTN (hypertension)   6. Acute blood loss anemia   7. Diastolic dysfunction       MDM  Patient [place] trochanter hip fracture will be admitted by hospitalist and Dr. Hilda Lias will be orthopedic surgeon. Temporary maneuvers completed for the hospitalist service.    I personally performed the services described in this documentation, which was scribed in my presence. The recorded information has been reviewed and is accurate.     Shelda Jakes, MD 05/01/13 [phone]

## 2013-04-28 NOTE — ED Notes (Signed)
Larey Seat today while throwing out trash on porch.  C/o pain to left hip with shortening and rotation noted.  Denies hitting head.  Pt alert and oriented x 4 at this time.

## 2013-04-28 NOTE — H&P (Signed)
[facility] History and Physical  Tiffany Maxwell JYN:[id number] DOB: Mar 23, 1934 DOA: 04/28/2013  Referring physician:  PCP: Rudi Heap, MD  Specialists:   Chief Complaint: hip pain/fall  HPI: Tiffany Maxwell is a 79 y.o. female with pmhx that includes HTN, COPD on home oxygen, osteoporosis, diastolic dysfunction who presents to ED from home with cc fall/left hip pain. Information obtained from pt.states she was taking out trash this am and when she turned about to return to house she tripped, fell onto left side. Denies loss of consciousness, or head trauma. She denies recent illness, fever, chills, anorexia. She denies unintentional wt loss, dysuria, hematuria, melena. She reports feeling sharp pain immediately in left hip and was unable to get up. Symptoms came on suddenly have persisted and characterized as severe. Work up in ED yield left hip xray with comminuted intertrochanteric fracture, BP 174/107, HR 87, white count 13.5. TRH asled to admit   Review of Systems: The patient denies anorexia, fever, weight loss,, vision loss, decreased hearing, hoarseness, chest pain, syncope, peripheral edema, balance deficits, hemoptysis, abdominal pain, melena, hematochezia, severe indigestion/heartburn, hematuria, incontinence, genital sores, muscle weakness, suspicious skin lesions, transient blindness, difficulty walking, depression, unusual weight change, abnormal bleeding, enlarged lymph nodes, angioedema, and breast masses.    Past Medical History  Diagnosis Date  . Osteoporosis   . Palpitations   . PAC (premature atrial contraction)   . Seborrheic keratosis   . Hypertension   . Hyperlipidemia   . COPD (chronic obstructive pulmonary disease)   . MI (myocardial infarction)   . Multiple fractures of thoracic spine, closed 01/30/2013  . Cholelithiasis 01/30/2013  . Pulmonary emphysema 01/30/2013  . History of tobacco use 01/30/2013  . Diastolic dysfunction 01/30/2013    Grade 1.  Ejection fraction 65%.   Past Surgical History  Procedure Laterality Date  . Tubal ligation     Social History:  reports that she quit smoking about 9 months ago. Her smoking use included Cigarettes. She has a 40 pack-year smoking history. She has never used smokeless tobacco. She reports that she does not drink alcohol or use illicit drugs. Lives with husband on home oxygen. Husband states he assists pt with ADL's as she unable due to shortness of breath.   Allergies  Allergen Reactions  . Codeine Other (See Comments)    headache  . Benicar Hct (Olmesartan Medoxomil-Hctz) Swelling and Rash  . Naproxen Itching, Rash and Other (See Comments)    Redness/flushing of skin    History reviewed. No pertinent family history. Pt unable to provide information regarding family medical hx due to pain/medication  Prior to Admission medications   Medication Sig Start Date End Date Taking? Authorizing Provider  albuterol-ipratropium (COMBIVENT) 18-103 MCG/ACT inhaler Inhale 2 puffs into the lungs every 6 (six) hours as needed for wheezing.   Yes Historical Provider, MD  Calcium Carbonate-Vitamin D (CALCIUM-VITAMIN D) 500-200 MG-UNIT per tablet Take 1 tablet by mouth daily. 01/30/13  Yes Elliot Cousin, MD  lisinopril (PRINIVIL,ZESTRIL) 40 MG tablet Take 1 tablet (40 mg total) by mouth daily. 04/01/13  Yes Ernestina Penna, MD  furosemide (LASIX) 20 MG tablet Take 20 mg by mouth daily as needed (Fluid).  02/20/13   Historical Provider, MD   Physical Exam: Filed Vitals:   04/28/13 0806 04/28/13 1016 04/28/13 1017 04/28/13 1120  BP: 156/101  185/116 174/107  Pulse:  63  87  Resp:  21  19  Height: 5' (1.524 m)     Weight: 60.328  kg (133 lb)     SpO2:  97%  99%     General:  Alert NAD  Eyes: PERRL EOMI No scleral icterus  ENT: ears clear, nose without drainage, oropharynx pink, dry no exudate  Neck: supple no JVD no lymphadenopathy  Cardiovascular: RRR No MGR no LE edema PPP  bilaterally  Respiratory: mild increased work of breathing with conversation. Extended expiratory phase. BS distant. No wheeze no rhonchi  Abdomen: soft +BS non-tendr to palpation  Skin: warm dry no lesion  Musculoskeletal: Left leg externally rotated and slightly shorter than right. Foot warm to touch. PPP  Psychiatric: calm cooperative   Neurologic: cranial nerve II-XII intact speech clear no facial symmetry.   Labs on Admission:  Basic Metabolic Panel:  Recent Labs Lab 04/28/13 0944  NA 135  K 3.5  CL 94*  CO2 34*  GLUCOSE 143*  BUN 4*  CREATININE 0.42*  CALCIUM 9.6   Liver Function Tests:  Recent Labs Lab 04/28/13 0944  AST 16  ALT 6  ALKPHOS 77  BILITOT 0.4  PROT 6.0  ALBUMIN 3.2*   No results found for this basename: LIPASE, AMYLASE,  in the last 168 hours No results found for this basename: AMMONIA,  in the last 168 hours CBC:  Recent Labs Lab 04/28/13 0944  WBC 13.5*  NEUTROABS 12.4*  HGB 12.6  HCT 37.8  MCV 86.3  PLT 239   Cardiac Enzymes: No results found for this basename: CKTOTAL, CKMB, CKMBINDEX, TROPONINI,  in the last 168 hours  BNP (last 3 results)  Recent Labs  01/28/13 1813 01/28/13 2310  PROBNP 75.5 80.2   CBG: No results found for this basename: GLUCAP,  in the last 168 hours  Radiological Exams on Admission: Dg Chest 1 View  04/28/2013   *RADIOLOGY REPORT*  Clinical Data: Pain post fall  CHEST - 1 VIEW  Comparison: 01/29/2013  Findings: Borderline cardiomegaly.  Atherosclerotic calcifications and tortuosity of the thoracic aorta again noted.  No acute infiltrate or pulmonary edema.  Old right rib fractures.  No diagnostic pneumothorax.  IMPRESSION: Borderline cardiomegaly.  No active disease.   Original Report Authenticated By: Natasha Mead, M.D.   Dg Hip Complete Left  04/28/2013   *RADIOLOGY REPORT*  Clinical Data: Recent fall with left hip pain  LEFT HIP - COMPLETE 2+ VIEW  Comparison: None.  Findings: There is a  comminuted fracture of the left intertrochanteric region with some impaction and angulation at the fracture site.  Pelvic ring is intact.  No other focal abnormality is seen.  IMPRESSION: Comminuted left intertrochanteric fracture   Original Report Authenticated By: Alcide Clever, M.D.    EKG: Independently reviewed. NSR  Assessment/Plan Principal Problem:   Hip fracture: due to mechanical fall. Admit pt to medical floor. Defer management to orthopedics. Provide pain med as needed.  Active Problems:   HTN (hypertension): currently uncontrolled likely due to pain and pt reports not having taken medications this am. Will hold home lasix, but continue lisinopril. Will provide hydralazine prn and pain med.    Osteoporosis, unspecified: see #1. Continue calcium      Diastolic dysfunction: grade 1. Will monitor closely. Recent echo with EF 60%.     Leukocytosis: likely reactive      . Pt afebrile, urine clean and chest xray without acute process. Will monitor.    Dr Hilda Lias requested by family and paged by ED MD  Code Status: full Family Communication: husband and daughter at bedside Disposition Plan: will likely  need short term placement.   Time spent: 30 minutes  Gwenyth Bender [facility] Pager [phone]  If 7PM-7AM, please contact night-coverage [email or web address] Password [facility] 04/28/2013, 11:36 AM   Attending note:  Patient seen and examined.  Above note amended.  She has been admitted with mechanical fall and hip fracture.  Medical problems include COPD and HTN.  Will avoid beta blockers due to COPD.  Continue home meds for HTN for now and adjust accordingly.  Selinda Korzeniewski

## 2013-04-28 NOTE — Consult Note (Signed)
Reason for Consult:Fracture of the left hip Referring Physician: ER  Tiffany Maxwell is an 79 y.o. female.  HPI: She fell at home today and hurt her left hip.  She was unable to stand.  X-rays show a comminuted fracture of the left hip.  She has no other injury.  She has long history of COPD.  I have told her and her husband and granddaughter about the fracture of the left hip.  She will need surgery.  Risks and imponderables discussed including infection, embolus which could result in death, need for physical therapy, possible blood transfusion and anesthesia risks.   I have recommended spinal.  They asked appropriate questions and appear to understand.  They agree to procedure. It will be done tomorrow around 9 -9:30 am.  Past Medical History  Diagnosis Date  . Osteoporosis   . Palpitations   . PAC (premature atrial contraction)   . Seborrheic keratosis   . Hypertension   . Hyperlipidemia   . COPD (chronic obstructive pulmonary disease)   . MI (myocardial infarction)   . Multiple fractures of thoracic spine, closed 01/30/2013  . Cholelithiasis 01/30/2013  . Pulmonary emphysema 01/30/2013  . History of tobacco use 01/30/2013  . Diastolic dysfunction 01/30/2013    Grade 1. Ejection fraction 65%.    Past Surgical History  Procedure Laterality Date  . Tubal ligation      History reviewed. No pertinent family history.  Social History:  reports that she quit smoking about 9 months ago. Her smoking use included Cigarettes. She has a 40 pack-year smoking history. She has never used smokeless tobacco. She reports that she does not drink alcohol or use illicit drugs.  Allergies:  Allergies  Allergen Reactions  . Codeine Other (See Comments)    headache  . Benicar Hct (Olmesartan Medoxomil-Hctz) Swelling and Rash  . Naproxen Itching, Rash and Other (See Comments)    Redness/flushing of skin    Medications: I have reviewed the patient's current medications.  Results for orders  placed during the hospital encounter of 04/28/13 (from the past 48 hour(s))  URINALYSIS, ROUTINE W REFLEX MICROSCOPIC     Status: None   Collection Time    04/28/13  8:35 AM      Result Value Range   Color, Urine YELLOW  YELLOW   APPearance CLEAR  CLEAR   Specific Gravity, Urine 1.010  1.005 - 1.030   pH 6.0  5.0 - 8.0   Glucose, UA NEGATIVE  NEGATIVE mg/dL   Hgb urine dipstick NEGATIVE  NEGATIVE   Bilirubin Urine NEGATIVE  NEGATIVE   Ketones, ur NEGATIVE  NEGATIVE mg/dL   Protein, ur NEGATIVE  NEGATIVE mg/dL   Urobilinogen, UA 0.2  0.0 - 1.0 mg/dL   Nitrite NEGATIVE  NEGATIVE   Leukocytes, UA NEGATIVE  NEGATIVE   Comment: MICROSCOPIC NOT DONE ON URINES WITH NEGATIVE PROTEIN, BLOOD, LEUKOCYTES, NITRITE, OR GLUCOSE <1000 mg/dL.  CBC WITH DIFFERENTIAL     Status: Abnormal   Collection Time    04/28/13  9:44 AM      Result Value Range   WBC 13.5 (*) 4.0 - 10.5 K/uL   RBC 4.38  3.87 - 5.11 MIL/uL   Hemoglobin 12.6  12.0 - 15.0 g/dL   HCT 21.3  08.6 - 57.8 %   MCV 86.3  78.0 - 100.0 fL   MCH 28.8  26.0 - 34.0 pg   MCHC 33.3  30.0 - 36.0 g/dL   RDW 46.9  62.9 - 52.8 %  Platelets 239  150 - 400 K/uL   Neutrophils Relative % 92 (*) 43 - 77 %   Neutro Abs 12.4 (*) 1.7 - 7.7 K/uL   Lymphocytes Relative 4 (*) 12 - 46 %   Lymphs Abs 0.5 (*) 0.7 - 4.0 K/uL   Monocytes Relative 4  3 - 12 %   Monocytes Absolute 0.6  0.1 - 1.0 K/uL   Eosinophils Relative 0  0 - 5 %   Eosinophils Absolute 0.0  0.0 - 0.7 K/uL   Basophils Relative 0  0 - 1 %   Basophils Absolute 0.0  0.0 - 0.1 K/uL  COMPREHENSIVE METABOLIC PANEL     Status: Abnormal   Collection Time    04/28/13  9:44 AM      Result Value Range   Sodium 135  135 - 145 mEq/L   Potassium 3.5  3.5 - 5.1 mEq/L   Chloride 94 (*) 96 - 112 mEq/L   CO2 34 (*) 19 - 32 mEq/L   Glucose, Bld 143 (*) 70 - 99 mg/dL   BUN 4 (*) 6 - 23 mg/dL   Creatinine, Ser 2.95 (*) 0.50 - 1.10 mg/dL   Calcium 9.6  8.4 - 62.1 mg/dL   Total Protein 6.0  6.0 - 8.3  g/dL   Albumin 3.2 (*) 3.5 - 5.2 g/dL   AST 16  0 - 37 U/L   ALT 6  0 - 35 U/L   Alkaline Phosphatase 77  39 - 117 U/L   Total Bilirubin 0.4  0.3 - 1.2 mg/dL   GFR calc non Af Amer >90  >90 mL/min   GFR calc Af Amer >90  >90 mL/min   Comment:            The eGFR has been calculated     using the CKD EPI equation.     This calculation has not been     validated in all clinical     situations.     eGFR's persistently     <90 mL/min signify     possible Chronic Kidney Disease.    Dg Chest 1 View  04/28/2013   *RADIOLOGY REPORT*  Clinical Data: Pain post fall  CHEST - 1 VIEW  Comparison: 01/29/2013  Findings: Borderline cardiomegaly.  Atherosclerotic calcifications and tortuosity of the thoracic aorta again noted.  No acute infiltrate or pulmonary edema.  Old right rib fractures.  No diagnostic pneumothorax.  IMPRESSION: Borderline cardiomegaly.  No active disease.   Original Report Authenticated By: Natasha Mead, M.D.   Dg Hip Complete Left  04/28/2013   *RADIOLOGY REPORT*  Clinical Data: Recent fall with left hip pain  LEFT HIP - COMPLETE 2+ VIEW  Comparison: None.  Findings: There is a comminuted fracture of the left intertrochanteric region with some impaction and angulation at the fracture site.  Pelvic ring is intact.  No other focal abnormality is seen.  IMPRESSION: Comminuted left intertrochanteric fracture   Original Report Authenticated By: Alcide Clever, M.D.    Review of Systems  Respiratory:       History COPD  Cardiovascular:       Hypertension and history of MI, no current problem she says  Gastrointestinal:       Gallbladder problems  Musculoskeletal: Positive for falls Larey Seat today at home and hurt left hip.  No other injury.).  All other systems reviewed and are negative.   Blood pressure 174/107, pulse 87, resp. rate 19, height 5' (1.524 m), weight 60.328  kg (133 lb), SpO2 99.00%. Physical Exam  Constitutional: She is oriented to person, place, and time. She appears  well-developed and well-nourished.  HENT:  Head: Normocephalic and atraumatic.  Eyes: Conjunctivae and EOM are normal. Pupils are equal, round, and reactive to light.  Neck: Normal range of motion. Neck supple.  Cardiovascular: Normal rate, regular rhythm, normal heart sounds and intact distal pulses.   Respiratory: Effort normal and breath sounds normal.  GI: Soft. Bowel sounds are normal.  Musculoskeletal: She exhibits tenderness (Pain left hip with any motion, shortened, externally rotated.).       Left hip: She exhibits decreased range of motion, decreased strength, tenderness, bony tenderness, crepitus and deformity.       Legs: Neurological: She is alert and oriented to person, place, and time. She has normal reflexes.  Skin: Skin is warm and dry.  Psychiatric: She has a normal mood and affect. Her behavior is normal. Judgment and thought content normal.    Assessment/Plan: Comminuted intertrochanteric fracture of the left hip.  For [employer] tomorrow.  Would benefit from SNF placement post operative.   Sharnise Blough 04/28/2013, 11:33 AM

## 2013-04-29 ENCOUNTER — Inpatient Hospital Stay (HOSPITAL_COMMUNITY): Payer: Medicare Other | Admitting: Anesthesiology

## 2013-04-29 ENCOUNTER — Encounter (HOSPITAL_COMMUNITY)
Admission: EM | Disposition: A | Discharge status: Skilled Nursing Facility | Payer: Self-pay | Source: Home / Self Care | Attending: Internal Medicine

## 2013-04-29 ENCOUNTER — Encounter (HOSPITAL_COMMUNITY): Payer: Self-pay | Admitting: Anesthesiology

## 2013-04-29 ENCOUNTER — Encounter (HOSPITAL_COMMUNITY): Payer: Self-pay | Admitting: *Deleted

## 2013-04-29 ENCOUNTER — Inpatient Hospital Stay (HOSPITAL_COMMUNITY): Payer: Medicare Other

## 2013-04-29 DIAGNOSIS — IMO0002 Reserved for concepts with insufficient information to code with codable children: Secondary | ICD-10-CM | POA: Diagnosis not present

## 2013-04-29 DIAGNOSIS — J441 Chronic obstructive pulmonary disease with (acute) exacerbation: Secondary | ICD-10-CM | POA: Diagnosis not present

## 2013-04-29 DIAGNOSIS — S72143A Displaced intertrochanteric fracture of unspecified femur, initial encounter for closed fracture: Secondary | ICD-10-CM | POA: Diagnosis not present

## 2013-04-29 DIAGNOSIS — D1739 Benign lipomatous neoplasm of skin and subcutaneous tissue of other sites: Secondary | ICD-10-CM | POA: Diagnosis not present

## 2013-04-29 DIAGNOSIS — I10 Essential (primary) hypertension: Secondary | ICD-10-CM | POA: Diagnosis not present

## 2013-04-29 DIAGNOSIS — S72009A Fracture of unspecified part of neck of unspecified femur, initial encounter for closed fracture: Secondary | ICD-10-CM | POA: Diagnosis not present

## 2013-04-29 DIAGNOSIS — M810 Age-related osteoporosis without current pathological fracture: Secondary | ICD-10-CM | POA: Diagnosis not present

## 2013-04-29 HISTORY — PX: COMPRESSION HIP SCREW: SHX1386

## 2013-04-29 LAB — BASIC METABOLIC PANEL
CO2: 32 mEq/L (ref 19–32)
Calcium: 8.8 mg/dL (ref 8.4–10.5)
Creatinine, Ser: 0.45 mg/dL — ABNORMAL LOW (ref 0.50–1.10)
GFR calc Af Amer: >90 mL/min (ref >90)
GFR calc non Af Amer: >90 mL/min (ref >90)
Sodium: 134 mEq/L — ABNORMAL LOW (ref 135–145)

## 2013-04-29 LAB — CBC
MCH: 28.8 pg (ref 26.0–34.0)
MCHC: 33.6 g/dL (ref 30.0–36.0)
MCV: 85.9 fL (ref 78.0–100.0)
Platelets: 178 10*3/uL (ref 150–400)
RBC: 3.47 MIL/uL — ABNORMAL LOW (ref 3.87–5.11)
RDW: 13.1 % (ref 11.5–15.5)

## 2013-04-29 SURGERY — COMPRESSION HIP
Anesthesia: Spinal | Site: Hip | Laterality: Left | Wound class: Clean

## 2013-04-29 MED ORDER — ACETAMINOPHEN 10 MG/ML IV SOLN
1000.0000 mg | Freq: Four times a day (QID) | INTRAVENOUS | Status: AC
  Administered 2013-04-29 – 2013-04-30 (×4): 1000 mg via INTRAVENOUS
  Filled 2013-04-29 (×4): qty 100

## 2013-04-29 MED ORDER — MAGNESIUM HYDROXIDE 400 MG/5ML PO SUSP
30.0000 mL | Freq: Every day | ORAL | Status: DC | PRN
  Administered 2013-05-02: 30 mL via ORAL
  Filled 2013-04-29: qty 30

## 2013-04-29 MED ORDER — MIDAZOLAM HCL 2 MG/2ML IJ SOLN
INTRAMUSCULAR | Status: AC
  Filled 2013-04-29: qty 2

## 2013-04-29 MED ORDER — FENTANYL CITRATE 0.05 MG/ML IJ SOLN
INTRAMUSCULAR | Status: DC | PRN
  Administered 2013-04-29: 25 ug via INTRAVENOUS

## 2013-04-29 MED ORDER — PROMETHAZINE HCL 25 MG/ML IJ SOLN: 12.5000 mg | INTRAMUSCULAR | Status: DC | PRN

## 2013-04-29 MED ORDER — BUPIVACAINE IN DEXTROSE 0.75-8.25 % IT SOLN
INTRATHECAL | Status: DC | PRN
  Administered 2013-04-29: 15 mg via INTRATHECAL

## 2013-04-29 MED ORDER — EPHEDRINE SULFATE 50 MG/ML IJ SOLN
INTRAMUSCULAR | Status: DC | PRN
  Administered 2013-04-29: 5 mg via INTRAVENOUS

## 2013-04-29 MED ORDER — HYDROGEN PEROXIDE 3 % EX SOLN
CUTANEOUS | Status: DC | PRN
  Administered 2013-04-29: 1 via TOPICAL

## 2013-04-29 MED ORDER — FENTANYL CITRATE 0.05 MG/ML IJ SOLN
INTRAMUSCULAR | Status: AC
  Filled 2013-04-29: qty 2

## 2013-04-29 MED ORDER — MIDAZOLAM HCL 5 MG/5ML IJ SOLN
INTRAMUSCULAR | Status: DC | PRN
  Administered 2013-04-29 (×2): 1 mg via INTRAVENOUS

## 2013-04-29 MED ORDER — PROPOFOL 10 MG/ML IV EMUL
INTRAVENOUS | Status: AC
  Filled 2013-04-29: qty 20

## 2013-04-29 MED ORDER — FENTANYL CITRATE 0.05 MG/ML IJ SOLN
25.0000 ug | INTRAMUSCULAR | Status: DC | PRN
  Administered 2013-04-29: 25 ug via INTRAVENOUS

## 2013-04-29 MED ORDER — BUPIVACAINE IN DEXTROSE 0.75-8.25 % IT SOLN
INTRATHECAL | Status: AC
  Filled 2013-04-29: qty 2

## 2013-04-29 MED ORDER — PROPOFOL INFUSION 10 MG/ML OPTIME
INTRAVENOUS | Status: DC | PRN
  Administered 2013-04-29: 25 ug/kg/min via INTRAVENOUS

## 2013-04-29 MED ORDER — MIDAZOLAM HCL 2 MG/2ML IJ SOLN
1.0000 mg | INTRAMUSCULAR | Status: DC | PRN
  Administered 2013-04-29 (×2): 2 mg via INTRAVENOUS

## 2013-04-29 MED ORDER — ARTIFICIAL TEARS OP OINT
TOPICAL_OINTMENT | OPHTHALMIC | Status: AC
  Filled 2013-04-29: qty 3.5

## 2013-04-29 MED ORDER — SODIUM CHLORIDE 0.9 % IR SOLN
Status: DC | PRN
  Administered 2013-04-29: 1000 mL

## 2013-04-29 MED ORDER — FENTANYL CITRATE 0.05 MG/ML IJ SOLN
25.0000 ug | INTRAMUSCULAR | Status: DC | PRN
Start: 2013-04-29 — End: 2013-04-29

## 2013-04-29 MED ORDER — HYDROMORPHONE HCL PF 1 MG/ML IJ SOLN: 0.5000 mg | INTRAMUSCULAR | Status: DC | PRN

## 2013-04-29 MED ORDER — CEFAZOLIN SODIUM-DEXTROSE 2-3 GM-% IV SOLR
INTRAVENOUS | Status: AC
  Filled 2013-04-29: qty 50

## 2013-04-29 MED ORDER — OXYCODONE-ACETAMINOPHEN 5-325 MG PO TABS
1.0000 | ORAL_TABLET | ORAL | Status: DC | PRN
  Administered 2013-04-29 – 2013-04-30 (×2): 2 via ORAL
  Administered 2013-04-30: 1 via ORAL
  Administered 2013-05-01 – 2013-05-02 (×4): 2 via ORAL
  Filled 2013-04-29 (×4): qty 2
  Filled 2013-04-29: qty 1
  Filled 2013-04-29 (×2): qty 2
  Filled 2013-04-29: qty 1

## 2013-04-29 MED ORDER — LACTATED RINGERS IV SOLN
INTRAVENOUS | Status: DC
  Administered 2013-04-29: 09:00:00 via INTRAVENOUS

## 2013-04-29 MED ORDER — ONDANSETRON HCL 4 MG/2ML IJ SOLN: 4.0000 mg | Freq: Once | INTRAMUSCULAR | Status: DC | PRN

## 2013-04-29 MED ORDER — FENTANYL CITRATE 0.05 MG/ML IJ SOLN
INTRAMUSCULAR | Status: DC | PRN
  Administered 2013-04-29 (×3): 25 ug via INTRAVENOUS

## 2013-04-29 MED ORDER — LIDOCAINE HCL (PF) 1 % IJ SOLN
INTRAMUSCULAR | Status: AC
  Filled 2013-04-29: qty 5

## 2013-04-29 SURGICAL SUPPLY — 54 items
BAG HAMPER (MISCELLANEOUS) ×2 IMPLANT
BIT DRILL 6.5 DISP STRL (BIT) ×2 IMPLANT
BLADE SURG SZ10 CARB STEEL (BLADE) ×4 IMPLANT
BLADE SURG SZ20 CARB STEEL (BLADE) ×2 IMPLANT
CLOTH BEACON ORANGE TIMEOUT ST (SAFETY) ×2 IMPLANT
COVER LIGHT HANDLE STERIS (MISCELLANEOUS) ×4 IMPLANT
COVER MAYO STAND XLG (DRAPE) ×2 IMPLANT
DRAPE STERI IOBAN 125X83 (DRAPES) ×2 IMPLANT
DRILL OMEGA BONE (BIT) ×2 IMPLANT
ELECT REM PT RETURN 9FT ADLT (ELECTROSURGICAL) ×2
ELECTRODE REM PT RTRN 9FT ADLT (ELECTROSURGICAL) ×1 IMPLANT
EVACUATOR 3/16  PVC DRAIN (DRAIN) ×1
EVACUATOR 3/16 PVC DRAIN (DRAIN) ×1 IMPLANT
GAUZE XEROFORM 5X9 LF (GAUZE/BANDAGES/DRESSINGS) ×2 IMPLANT
GLOVE BIO SURGEON STRL SZ8 (GLOVE) ×2 IMPLANT
GLOVE BIO SURGEON STRL SZ8.5 (GLOVE) ×2 IMPLANT
GLOVE BIOGEL PI IND STRL 7.0 (GLOVE) IMPLANT
GLOVE BIOGEL PI IND STRL 8 (GLOVE) IMPLANT
GLOVE BIOGEL PI INDICATOR 7.0 (GLOVE) ×1
GLOVE BIOGEL PI INDICATOR 8 (GLOVE) ×1
GLOVE ECLIPSE 7.0 STRL STRAW (GLOVE) ×1 IMPLANT
GLOVE SS BIOGEL STRL SZ 6.5 (GLOVE) IMPLANT
GLOVE SUPERSENSE BIOGEL SZ 6.5 (GLOVE) ×1
GOWN STRL REIN XL XLG (GOWN DISPOSABLE) ×6 IMPLANT
GUIDE PIN CALIBRATED (PIN) ×2 IMPLANT
GUIDE PIN CALIBRATED 2.4X23 (PIN) ×1 IMPLANT
INST SET MAJOR BONE (KITS) ×2 IMPLANT
KIT BLADEGUARD II DBL (SET/KITS/TRAYS/PACK) ×2 IMPLANT
KIT ROOM TURNOVER AP CYSTO (KITS) ×2 IMPLANT
MANIFOLD NEPTUNE II (INSTRUMENTS) ×2 IMPLANT
MARKER SKIN DUAL TIP RULER LAB (MISCELLANEOUS) ×2 IMPLANT
NS IRRIG 1000ML POUR BTL (IV SOLUTION) ×2 IMPLANT
PACK BASIC III (CUSTOM PROCEDURE TRAY) ×2
PACK SRG BSC III STRL LF ECLPS (CUSTOM PROCEDURE TRAY) ×1 IMPLANT
PAD ABD 5X9 TENDERSORB (GAUZE/BANDAGES/DRESSINGS) ×3 IMPLANT
PAD ARMBOARD 7.5X6 YLW CONV (MISCELLANEOUS) ×2 IMPLANT
PENCIL HANDSWITCHING (ELECTRODE) ×2 IMPLANT
PLATE SHORT BARREL 135X4 (Plate) ×1 IMPLANT
SCREW CORTICAL SFTP 4.5X36MM (Screw) ×2 IMPLANT
SCREW CORTICAL SFTP 4.5X38MM (Screw) ×1 IMPLANT
SCREW CORTICAL SFTP 4.5X40MM (Screw) ×1 IMPLANT
SCREW LAG 85MM (Screw) ×2 IMPLANT
SCREW LAGSTD 85X21X12.7X9 (Screw) IMPLANT
SET BASIN LINEN APH (SET/KITS/TRAYS/PACK) ×2 IMPLANT
SPONGE GAUZE 4X4 12PLY (GAUZE/BANDAGES/DRESSINGS) ×2 IMPLANT
SPONGE LAP 18X18 X RAY DECT (DISPOSABLE) ×4 IMPLANT
STAPLER VISISTAT 35W (STAPLE) ×2 IMPLANT
SUT BRALON NAB BRD #1 30IN (SUTURE) ×5 IMPLANT
SUT PLAIN 2 0 XLH (SUTURE) ×3 IMPLANT
SUT SILK 0 FSL (SUTURE) ×2 IMPLANT
SYR BULB IRRIGATION 50ML (SYRINGE) ×2 IMPLANT
TAPE MEDIFIX FOAM 3 (GAUZE/BANDAGES/DRESSINGS) ×2 IMPLANT
YANKAUER SUCT 12FT TUBE ARGYLE (SUCTIONS) ×2 IMPLANT
YANKAUER SUCT BULB TIP 10FT TU (MISCELLANEOUS) ×2 IMPLANT

## 2013-04-29 NOTE — Progress Notes (Signed)
[facility] PROGRESS NOTE  Tiffany Maxwell BMW:[id number] DOB: [birth year] DOA: 04/28/2013 PCP: Rudi Heap, MD  Assessment/Plan: Hip fracture: due to mechanical fall. For surgery today. Pt reports fair pain control.  Defer management to orthopedics. Provide pain med as needed.   Active Problems:  HTN (hypertension): Better control this am.  Will continue to hold home lasix, but continue lisinopril. Will provide hydralazine prn and pain med.   Osteoporosis, unspecified: see #1. Continue calcium  COPD.  Appears to be stable at this time.  Continue nebulizer treatments and oxygen.  Encouraged incentive spirometry.   Diastolic dysfunction: grade 1.  Recent echo with EF 60%. Volume status -165cc. Wt 58.4kg.   Leukocytosis: likely reactive .  Resolved this am. Pt afebrile, urine clean and chest xray without acute process. Continue to monitor.    Hypokalemia: mild. Will replete and monitor.   Hyponatremia: mild. Likely related to decreased po intake. Will hydrate with IV fluids. Continue to monitor.    Code Status: full Family Communication: daughter at bedside. Disposition Plan: likely need SNF when ready   Consultants:  ortho  Procedures:    Antibiotics:  none  HPI/Subjective: Awake, reports sleeping "ok". Pain controlled.   Objective: Filed Vitals:   04/28/13 2100 04/29/13 0238 04/29/13 0500 04/29/13 0721  BP: 177/61  162/71   Pulse: 78  81   Temp: 98.4 F (36.9 C)  98.3 F (36.8 C)   TempSrc: Oral  Oral   Resp: 18  20   Height:      Weight:      SpO2: 99% 97% 94% 95%    Intake/Output Summary (Last 24 hours) at 04/29/13 0834 Last data filed at 04/29/13 0500  Gross per 24 hour  Intake    610 ml  Output    775 ml  Net   -165 ml   Filed Weights   04/28/13 0806 04/28/13 1224  Weight: 60.328 kg (133 lb) 58.423 kg (128 lb 12.8 oz)    Exam:   General:  Awake NAD  Cardiovascular: RRR No MGR No LE edema. PPP  Respiratory: normal effort. BS  somewhat distant but clear no wheeze no rhonchi  Abdomen: flat soft +BS non-tender to palpation  Musculoskeletal: left leg shorter and externally rotated. Left foot warm to touch.    Data Reviewed: Basic Metabolic Panel:  Recent Labs Lab 04/28/13 0944 04/29/13 0528  NA 135 134*  K 3.5 3.4*  CL 94* 97  CO2 34* 32  GLUCOSE 143* 107*  BUN 4* 6  CREATININE 0.42* 0.45*  CALCIUM 9.6 8.8   Liver Function Tests:  Recent Labs Lab 04/28/13 0944  AST 16  ALT 6  ALKPHOS 77  BILITOT 0.4  PROT 6.0  ALBUMIN 3.2*   No results found for this basename: LIPASE, AMYLASE,  in the last 168 hours No results found for this basename: AMMONIA,  in the last 168 hours CBC:  Recent Labs Lab 04/28/13 0944 04/29/13 0528  WBC 13.5* 7.3  NEUTROABS 12.4*  --   HGB 12.6 10.0*  HCT 37.8 29.8*  MCV 86.3 85.9  PLT 239 178   Cardiac Enzymes: No results found for this basename: CKTOTAL, CKMB, CKMBINDEX, TROPONINI,  in the last 168 hours BNP (last 3 results)  Recent Labs  01/28/13 1813 01/28/13 2310  PROBNP 75.5 80.2   CBG: No results found for this basename: GLUCAP,  in the last 168 hours  Recent Results (from the past 240 hour(s))  SURGICAL PCR SCREEN  Status: None   Collection Time    04/28/13  3:55 PM      Result Value Range Status   MRSA, PCR NEGATIVE  NEGATIVE Final   Staphylococcus aureus NEGATIVE  NEGATIVE Final   Comment:            The Xpert SA Assay (FDA     approved for NASAL specimens     in patients over 80 years of age),     is one component of     a comprehensive surveillance     program.  Test performance has     been validated by [employer] for patients greater     than or equal to 40 year old.     It is not intended     to diagnose infection nor to     guide or monitor treatment.     Studies: Dg Chest 1 View  04/28/2013   *RADIOLOGY REPORT*  Clinical Data: Pain post fall  CHEST - 1 VIEW  Comparison: 01/29/2013  Findings: Borderline cardiomegaly.   Atherosclerotic calcifications and tortuosity of the thoracic aorta again noted.  No acute infiltrate or pulmonary edema.  Old right rib fractures.  No diagnostic pneumothorax.  IMPRESSION: Borderline cardiomegaly.  No active disease.   Original Report Authenticated By: Natasha Mead, M.D.   Dg Hip Complete Left  04/28/2013   *RADIOLOGY REPORT*  Clinical Data: Recent fall with left hip pain  LEFT HIP - COMPLETE 2+ VIEW  Comparison: None.  Findings: There is a comminuted fracture of the left intertrochanteric region with some impaction and angulation at the fracture site.  Pelvic ring is intact.  No other focal abnormality is seen.  IMPRESSION: Comminuted left intertrochanteric fracture   Original Report Authenticated By: Alcide Clever, M.D.    Scheduled Meds: . [facility] HOLD] albuterol  2.5 mg Nebulization Q6H  . [[facility] HOLD]  ceFAZolin (ANCEF) IV  2 g Intravenous 60 min Pre-Op  . [[facility] HOLD] enoxaparin (LOVENOX) injection  40 mg Subcutaneous Q24H  . [[facility] HOLD] ipratropium  0.5 mg Nebulization Q6H  . [[facility] HOLD] lisinopril  40 mg Oral Daily  . [facility] HOLD] povidone-iodine   Topical 30 min Pre-Op   Continuous Infusions: . sodium chloride 75 mL/hr at 04/28/13 2324    Principal Problem:   Hip fracture Active Problems:   HTN (hypertension)   Osteoporosis, unspecified   History of tobacco use   Diastolic dysfunction   Leukocytosis    Time spent: 30 minutes    [facility] M  [facility] Pager [phone]. If 7PM-7AM, please contact night-coverage at [email or web address], password [facility] 04/29/2013, 8:34 AM  LOS: 1 day   Attending note:  Patient seen and independently examined.  Above note reviewed and amended.   Patient was seen in room after surgery.  She is awake and alert.  Reports pain at operative site.  Respiratory status appears stable.  Will continue current treatments and encourage physical therapy.  Post op care per orthopedics.  [name]

## 2013-04-29 NOTE — Progress Notes (Signed)
UR chart review completed.  

## 2013-04-29 NOTE — Anesthesia Preprocedure Evaluation (Signed)
Anesthesia Evaluation  Patient identified by MRN, date of birth, ID band Patient awake    Reviewed: Allergy & Precautions, H&P , NPO status , Patient's Chart, lab work & pertinent test results  Airway Mallampati: II  Neck ROM: Full    Dental  (+) Edentulous Upper and Edentulous Lower   Pulmonary COPD (emphysema) COPD inhaler and oxygen dependent, former smoker,  breath sounds clear to auscultation  - decreased breath sounds      Cardiovascular hypertension, Pt. on medications + CAD and + Past MI + dysrhythmias Rhythm:Regular Rate:Normal     Neuro/Psych    GI/Hepatic   Endo/Other    Renal/GU      Musculoskeletal   Abdominal   Peds  Hematology   Anesthesia Other Findings   Reproductive/Obstetrics                           Anesthesia Physical Anesthesia Plan  ASA: III  Anesthesia Plan: Spinal   Post-op Pain Management:    Induction:   Airway Management Planned: Nasal Cannula  Additional Equipment:   Intra-op Plan:   Post-operative Plan:   Informed Consent: I have reviewed the patients History and Physical, chart, labs and discussed the procedure including the risks, benefits and alternatives for the proposed anesthesia with the patient or authorized representative who has indicated his/her understanding and acceptance.     Plan Discussed with:   Anesthesia Plan Comments:         Anesthesia Quick Evaluation

## 2013-04-29 NOTE — Anesthesia Procedure Notes (Signed)
Spinal  Patient location during procedure: OR Start time: 04/29/2013 10:00 AM Staffing CRNA/Resident: Sidi Dzikowski J Preanesthetic Checklist Completed: patient identified, site marked, surgical consent, pre-op evaluation, timeout performed, IV checked, risks and benefits discussed and monitors and equipment checked Spinal Block Patient position: left lateral decubitus Prep: Betadine Patient monitoring: cardiac monitor, heart rate, continuous pulse ox and blood pressure Approach: midline Location: L2-3 Injection technique: single-shot Needle Needle type: Spinocan  Needle gauge: 22 G Assessment Sensory level: T10 Additional Notes CSF brisk and clear      [id number]  11/2013

## 2013-04-29 NOTE — Clinical Social Work Psychosocial (Signed)
Clinical Social Work Department BRIEF PSYCHOSOCIAL ASSESSMENT 04/29/2013  Patient:  Tiffany Maxwell, Tiffany Maxwell     Account Number:  [id number]     Admit date:  04/28/2013  Clinical Social Worker:  Nancie Neas  Date/Time:  04/29/2013 02:10 PM  Referred by:  Physician  Date Referred:  04/29/2013 Referred for  SNF Placement   Other Referral:   Interview type:  Patient Other interview type:   and family    PSYCHOSOCIAL DATA Living Status:  FACILITY Admitted from facility:   Level of care:   Primary support name:  Odell Primary support relationship to patient:  SPOUSE Degree of support available:   very involved family    CURRENT CONCERNS Current Concerns  Post-Acute Placement   Other Concerns:    SOCIAL WORK ASSESSMENT / PLAN CSW met with pt and pt's family at bedside following surgery. Pt alert and oriented and reports she lives with her husband. She states he told her he was going to take out the trash, but pt decided to do it herself. She fell outside, fracturing her hip. Pt has multiple family members present during assessment. She has 6 living children, most who live locally. Two granddaughters are also very involved. At baseline, pt indicates she is independent in care. However, family say she requires assist with ADLs. CSW discussed placement process as pt will likely require SNF at d/c. They are prepared for this. Aware of Medicare coverage/criteria. Family plan to look into facilities further as they are unsure what their preference would be, but do request [facility]. SNF list provided.   Assessment/plan status:  Psychosocial Support/Ongoing Assessment of Needs Other assessment/ plan:   Information/referral to community resources:   SNF list    PATIENT'S/FAMILY'S RESPONSE TO PLAN OF CARE: Pt and pt's family agreeable to [facility] SNF for rehab. CSW will fax out FL2 and follow up with bed offers when available.       Derenda Fennel, [place] [phone]

## 2013-04-29 NOTE — Transfer of Care (Signed)
Immediate Anesthesia Transfer of Care Note  Patient: Tiffany Maxwell  Procedure(s) Performed: Procedure(s) with comments: COMPRESSION HIP (Left) - Katrinka Blazing & Nephew  Patient Location: PACU  Anesthesia Type:Spinal  Level of Consciousness: awake and patient cooperative  Airway & Oxygen Therapy: Patient Spontanous Breathing and Patient connected to face mask oxygen  Post-op Assessment: Report given to PACU RN and Post -op Vital signs reviewed and stable  Post vital signs: Reviewed and stable  Complications: No apparent anesthesia complications

## 2013-04-29 NOTE — Clinical Social Work Placement (Signed)
Clinical Social Work Department CLINICAL SOCIAL WORK PLACEMENT NOTE 04/29/2013  Patient:  Tiffany Maxwell, Tiffany Maxwell  Account Number:  [id number] Admit date:  04/28/2013  Clinical Social Worker:  Derenda Fennel, LCSW  Date/time:  04/29/2013 02:15 PM  Clinical Social Work is seeking post-discharge placement for this patient at the following level of care:   SKILLED NURSING   (*CSW will update this form in Epic as items are completed)   04/29/2013  Patient/family provided with [facility] Department of Clinical Social Work's list of facilities offering this level of care within the geographic area requested by the patient (or if unable, by the patient's family).  04/29/2013  Patient/family informed of their freedom to choose among providers that offer the needed level of care, that participate in Medicare, Medicaid or managed care program needed by the patient, have an available bed and are willing to accept the patient.  04/29/2013  Patient/family informed of [facility]' ownership interest in [facility], as well as of the fact that they are under no obligation to receive care at this facility.  PASARR submitted to EDS on 04/29/2013 PASARR number received from EDS on 04/29/2013  FL2 transmitted to all facilities in geographic area requested by pt/family on  04/29/2013 FL2 transmitted to all facilities within larger geographic area on   Patient informed that his/her managed care company has contracts with or will negotiate with  certain facilities, including the following:     Patient/family informed of bed offers received:   Patient chooses bed at  Physician recommends and patient chooses bed at    Patient to be transferred to  on   Patient to be transferred to facility by   The following physician request were entered in Epic:   Additional Comments:  Derenda Fennel, LCSW [phone]

## 2013-04-29 NOTE — Anesthesia Postprocedure Evaluation (Addendum)
  Anesthesia Post-op Note  Patient: Tiffany Maxwell  Procedure(s) Performed: Procedure(s) with comments: COMPRESSION HIP (Left) - Smith & Nephew  Patient Location: PACU  Anesthesia Type:Spinal  Level of Consciousness: awake, alert  and patient cooperative  Airway and Oxygen Therapy: Patient Spontanous Breathing and Patient connected to nasal cannula oxygen  Post-op Pain: 3 /10, mild  Post-op Assessment: Post-op Vital signs reviewed, Patient's Cardiovascular Status Stable, Respiratory Function Stable, Patent Airway and Pain level controlled  Post-op Vital Signs: Reviewed and stable  Complications: No apparent anesthesia complications 05/01/13  Foley cath removed this am, no documentation noted in the chart.  Nurse notified and requested this be documented.

## 2013-04-29 NOTE — Brief Op Note (Signed)
04/28/2013 - 04/29/2013  11:10 AM  PATIENT:  Tiffany Maxwell  79 y.o. female  PRE-OPERATIVE DIAGNOSIS:  left hip fx comminuted intertrochanteric   POST-OPERATIVE DIAGNOSIS:  left hip fx comminuted intertrochanteric  PROCEDURE:  Procedure(s) with comments: COMPRESSION HIP (Left) - Smith & Nephew 135 degree angle short barrel side plate with 85 mm compression screw  SURGEON:  Surgeon(s) and Role:    * Darreld Mclean, MD - Primary  PHYSICIAN ASSISTANT:   ASSISTANTS: none   ANESTHESIA:   spinal  EBL:  Total I/O In: 500 [I.V.:500] Out: 100 [Urine:100]  BLOOD ADMINISTERED:none  DRAINS: (large) Hemovact drain(s) in the left hip area with  Suction Open   LOCAL MEDICATIONS USED:  NONE  SPECIMEN:  Source of Specimen:  large lipoma from operative site, incidental finding  DISPOSITION OF SPECIMEN:  PATHOLOGY  COUNTS:  YES  TOURNIQUET:  * No tourniquets in log *  DICTATION: .Other Dictation: Dictation Number [id number]  PLAN OF CARE: Admit to inpatient   PATIENT DISPOSITION:  PACU - hemodynamically stable.   Delay start of Pharmacological VTE agent (>24hrs) due to surgical blood loss or risk of bleeding: no

## 2013-04-30 DIAGNOSIS — S72009A Fracture of unspecified part of neck of unspecified femur, initial encounter for closed fracture: Secondary | ICD-10-CM | POA: Diagnosis not present

## 2013-04-30 DIAGNOSIS — D62 Acute posthemorrhagic anemia: Secondary | ICD-10-CM | POA: Diagnosis not present

## 2013-04-30 DIAGNOSIS — I519 Heart disease, unspecified: Secondary | ICD-10-CM | POA: Diagnosis not present

## 2013-04-30 DIAGNOSIS — I10 Essential (primary) hypertension: Secondary | ICD-10-CM | POA: Diagnosis not present

## 2013-04-30 LAB — CBC WITH DIFFERENTIAL/PLATELET
Basophils Absolute: 0 10*3/uL (ref 0.0–0.1)
Basophils Relative: 0 % (ref 0–1)
Eosinophils Absolute: 0 10*3/uL (ref 0.0–0.7)
Eosinophils Relative: 0 % (ref 0–5)
HCT: 21.5 % — ABNORMAL LOW (ref 36.0–46.0)
Hemoglobin: 7.6 g/dL — ABNORMAL LOW (ref 12.0–15.0)
Lymphocytes Relative: 12 % (ref 12–46)
Lymphs Abs: 1.3 10*3/uL (ref 0.7–4.0)
MCH: 30.4 pg (ref 26.0–34.0)
MCHC: 35.3 g/dL (ref 30.0–36.0)
MCV: 86 fL (ref 78.0–100.0)
Monocytes Absolute: 1.2 10*3/uL — ABNORMAL HIGH (ref 0.1–1.0)
Monocytes Relative: 11 % (ref 3–12)
Neutro Abs: 8 10*3/uL — ABNORMAL HIGH (ref 1.7–7.7)
Neutrophils Relative %: 77 % (ref 43–77)
Platelets: 193 10*3/uL (ref 150–400)
RBC: 2.5 MIL/uL — ABNORMAL LOW (ref 3.87–5.11)
RDW: 13.3 % (ref 11.5–15.5)
WBC: 10.5 10*3/uL (ref 4.0–10.5)

## 2013-04-30 LAB — BASIC METABOLIC PANEL
BUN: 10 mg/dL (ref 6–23)
CO2: 31 mEq/L (ref 19–32)
Calcium: 9.1 mg/dL (ref 8.4–10.5)
Chloride: 97 mEq/L (ref 96–112)
Creatinine, Ser: 0.45 mg/dL — ABNORMAL LOW (ref 0.50–1.10)
GFR calc Af Amer: >90 mL/min (ref >90)
GFR calc non Af Amer: >90 mL/min (ref >90)
Glucose, Bld: 127 mg/dL — ABNORMAL HIGH (ref 70–99)
Potassium: 3.6 mEq/L (ref 3.5–5.1)
Sodium: 134 mEq/L — ABNORMAL LOW (ref 135–145)

## 2013-04-30 MED ORDER — FUROSEMIDE 20 MG PO TABS
20.0000 mg | ORAL_TABLET | Freq: Every day | ORAL | Status: DC
  Administered 2013-04-30 – 2013-05-02 (×3): 20 mg via ORAL
  Filled 2013-04-30 (×3): qty 1

## 2013-04-30 NOTE — Progress Notes (Signed)
Subjective: 1 Day Post-Op Procedure(s) (LRB): COMPRESSION HIP (Left) Patient reports pain as 4 on 0-10 scale.    Objective: Vital signs in last 24 hours: Temp:  [97.5 F (36.4 C)-98.5 F (36.9 C)] 98.1 F (36.7 C) (05/21 0602) Pulse Rate:  [87-118] 102 (05/21 0602) Resp:  [15-30] 20 (05/21 0602) BP: (103-174)/(61-118) 135/73 mmHg (05/21 0602) SpO2:  [84 %-100 %] 91 % (05/21 0602)  Intake/Output from previous day: 05/20 0701 - 05/21 0700 In: 4215 [I.V.:3715; IV Piggyback:300] Out: 580 [Urine:550; Drains:30] Intake/Output this shift: Total I/O In: 1133.8 [I.V.:833.8; IV Piggyback:300] Out: 280 [Urine:250; Drains:30]   Recent Labs  04/28/13 0944 04/29/13 0528  HGB 12.6 10.0*    Recent Labs  04/28/13 0944 04/29/13 0528  WBC 13.5* 7.3  RBC 4.38 3.47*  HCT 37.8 29.8*  PLT 239 178    Recent Labs  04/28/13 0944 04/29/13 0528  NA 135 134*  K 3.5 3.4*  CL 94* 97  CO2 34* 32  BUN 4* 6  CREATININE 0.42* 0.45*  GLUCOSE 143* 107*  CALCIUM 9.6 8.8   No results found for this basename: LABPT, INR,  in the last 72 hours  Neurologically intact Neurovascular intact Sensation intact distally Intact pulses distally Dorsiflexion/Plantar flexion intact  Assessment/Plan: 1 Day Post-Op Procedure(s) (LRB): COMPRESSION HIP (Left) Up with therapy  I will be out of town from noon today until Tuesday next week.  Dr. Romeo Apple to cover patient for me.  Patient and family aware.  Tiffany Maxwell 04/30/2013, 6:21 AM

## 2013-04-30 NOTE — Clinical Social Work Note (Signed)
CSW presented bed offers and pt and family choose [facility]. Facility notified. Pt in chair after therapy. Awaiting stability for d/c, possibly tomorrow per MD.  Derenda Fennel, LCSW [phone]

## 2013-04-30 NOTE — Progress Notes (Signed)
Physical Therapy Treatment Patient Details Name: Tiffany Maxwell MRN: [id number] DOB: Jun 14, 1934 Today's Date: 04/30/2013 Time: 1150-1207 PT Time Calculation (min): 17 min  PT Assessment / Plan / Recommendation Comments on Treatment Session   Pt able to assist with transfers; well motivated.    Follow Up Recommendations  SNF     Does the patient have the potential to tolerate intense rehabilitation   no  Barriers to Discharge   none    Equipment Recommendations  Rolling walker with 5" wheels    Recommendations for Other Services OT consult  Frequency 7X/week   Plan      Precautions / Restrictions Precautions Precautions: Fall Restrictions Weight Bearing Restrictions: Yes LLE Weight Bearing: Touchdown weight bearing   Pertinent Vitals/Pain 5/10    Mobility  Bed Mobility Bed Mobility: Supine to Sit Supine to Sit: 3: Mod assist Transfers Transfers: Sit to Stand;Stand to Sit;Stand Pivot Transfers Sit to Stand: 3: Mod assist Stand to Sit: 4: Min assist Stand Pivot Transfers: 4: Min assist Details for Transfer Assistance: Pt stood at bedside x one minute prior to having to sit down. Ambulation/Gait Ambulation/Gait Assistance: Not tested (comment)    Exercises General Exercises - Lower Extremity Ankle Circles/Pumps: Both;10 reps Quad Sets: Both;10 reps Gluteal Sets: Both;10 reps  PT Diagnosis: Difficulty walking;Generalized weakness  PT Problem List: Decreased strength;Decreased range of motion;Decreased activity tolerance;Decreased mobility;Pain;Decreased knowledge of use of DME PT Treatment Interventions: Gait training;Functional mobility training;Therapeutic exercise   PT Goals Acute Rehab PT Goals PT Goal Formulation: With patient Time For Goal Achievement: 05/05/13 Potential to Achieve Goals: Good Pt will go Supine/Side to Sit: with min assist PT Goal: Supine/Side to Sit - Progress: Goal set today Pt will go Sit to Stand: with min assist PT Goal: Sit to  Stand - Progress: Goal set today Pt will go Stand to Sit: with min assist PT Goal: Stand to Sit - Progress: Goal set today Pt will Transfer Bed to Chair/Chair to Bed: with min assist PT Transfer Goal: Bed to Chair/Chair to Bed - Progress: Goal set today Pt will Stand: with modified independence;with bilateral upper extremity support;1 - 2 min PT Goal: Stand - Progress: Goal set today Pt will Ambulate: 16 - 50 feet;with supervision PT Goal: Ambulate - Progress: Goal set today  Visit Information  Last PT Received On: 04/30/13    Subjective Data  Subjective: If I talk I forget about the pain. Patient Stated Goal: To be walking again.   Cognition  Cognition Arousal/Alertness: Awake/alert Overall Cognitive Status: Within Functional Limits for tasks assessed    Balance     End of Session PT - End of Session Equipment Utilized During Treatment: Gait belt;Oxygen Activity Tolerance: Patient limited by pain Patient left: in chair;with call bell/phone within reach;with family/visitor present Nurse Communication: Mobility status   GP     RUSSELL,CINDY 04/30/2013, 12:11 PM

## 2013-04-30 NOTE — Care Management Note (Signed)
    Page 1 of 1   05/02/2013     10:45:14 AM   CARE MANAGEMENT NOTE 05/02/2013  Patient:  Tiffany Maxwell, Tiffany Maxwell   Account Number:  [id number]  Date Initiated:  04/30/2013  Documentation initiated by:  Sharrie Rothman  Subjective/Objective Assessment:   Pt admitted from home with fractured hip. Pt lives with her husband and does require some assistance with ADL's. Pt may need SNF at discharge.     Action/Plan:   PT is recommending SNF at discharge. CSW to arrange discharge ot facility when medically stable.   Anticipated DC Date:  05/02/2013   Anticipated DC Plan:  SKILLED NURSING FACILITY  In-house referral  Clinical Social Worker      DC Planning Services  CM consult      Choice offered to / List presented to:             Status of service:  Completed, signed off Medicare Important Message given?  YES (If response is "NO", the following Medicare IM given date fields will be blank) Date Medicare IM given:  05/02/2013 Date Additional Medicare IM given:    Discharge Disposition:  SKILLED NURSING FACILITY  Per UR Regulation:    If discussed at Long Length of Stay Meetings, dates discussed:    Comments:  05/02/13 1045 Arlyss Queen, RN BSN CM Pt discharged to [facility] today. CSW to arrange discharge to facility.  04/30/13 1350 Arlyss Queen, RN BSN CM

## 2013-04-30 NOTE — Anesthesia Postprocedure Evaluation (Signed)
  Anesthesia Post-op Note  Patient: Tiffany Maxwell  Procedure(s) Performed: Procedure(s) with comments: COMPRESSION HIP (Left) - Katrinka Blazing & Nephew  Patient Location: Room 322  Anesthesia Type:Spinal  Level of Consciousness: awake, alert , oriented and patient cooperative  Airway and Oxygen Therapy: Patient Spontanous Breathing  Post-op Pain: mild  Post-op Assessment: Post-op Vital signs reviewed, Patient's Cardiovascular Status Stable, Respiratory Function Stable and Patent Airway  Post-op Vital Signs: Reviewed and stable  Complications: No apparent anesthesia complications

## 2013-04-30 NOTE — Progress Notes (Signed)
[facility] PROGRESS NOTE  Tiffany Maxwell QIO:[id number] DOB: 1934/08/24 DOA: 04/28/2013 PCP: Rudi Heap, MD  Assessment/Plan: Hip fracture: due to mechanical fall. Post of day 1. Pt reports fair pain control. Defer management to orthopedics. Provide pain med as needed.   Active Problems:  Acute blood loss anemia: likely due to surgery. Will transfuse 2 units PRBC'c. No s/sx active bleeding. Recheck in am.  HTN (hypertension): Controlled.  Will resume home lasix (20mg ) as pt taking po fluid and will be getting 2 units blood. Decrease IV rate as well. Continue lisinopril. Will provide hydralazine prn and pain med.   Osteoporosis, unspecified: see #1. Continue calcium   COPD. Remains stable at this time. Continue nebulizer treatments and oxygen. Encouraged incentive spirometry.   Diastolic dysfunction: grade 1. Recent echo with EF 60%. Volume status +3L. Wt 58.4kg.  Leukocytosis: likely reactive . Resolved.Pt afebrile, urine clean and chest xray without acute process. Continue to monitor.   Hypokalemia: resolved.  Monitor.   Hyponatremia: mild. Likely related to decreased po intake. Monitor    Code Status: full Family Communication:  Disposition Plan: likely SNF hopefully tomorrow   Consultants:  orthopedics  Procedures:  Left hip fx repair  Antibiotics:  none  HPI/Subjective: Sitting up eating. States "im lonely i wish someone would come talk to me". Denies pain/discomfort  Objective: Filed Vitals:   04/30/13 0107 04/30/13 0602 04/30/13 0651 04/30/13 0858  BP:  135/73    Pulse:  102    Temp:  98.1 F (36.7 C)    TempSrc:  Oral    Resp:  20    Height:      Weight:      SpO2: 95% 91% 93% 94%    Intake/Output Summary (Last 24 hours) at 04/30/13 0926 Last data filed at 04/30/13 0602  Gross per 24 hour  Intake   4215 ml  Output    480 ml  Net   3735 ml   Filed Weights   04/28/13 0806 04/28/13 1224  Weight: 60.328 kg (133 lb) 58.423 kg (128 lb  12.8 oz)    Exam:   General:  Awake somewhat frail NAD  Cardiovascular: tachycardic regular, no MGR No LE edema  Respiratory: normal effort BS distant, clear no wheeze  Abdomen: flat +BS non-tender to palpation  Musculoskeletal: no clubbing cyanosis. Left hip dressing dry and intact. Left foot warm to touch    Data Reviewed: Basic Metabolic Panel:  Recent Labs Lab 04/28/13 0944 04/29/13 0528 04/30/13 0551  NA 135 134* 134*  K 3.5 3.4* 3.6  CL 94* 97 97  CO2 34* 32 31  GLUCOSE 143* 107* 127*  BUN 4* 6 10  CREATININE 0.42* 0.45* 0.45*  CALCIUM 9.6 8.8 9.1   Liver Function Tests:  Recent Labs Lab 04/28/13 0944  AST 16  ALT 6  ALKPHOS 77  BILITOT 0.4  PROT 6.0  ALBUMIN 3.2*   No results found for this basename: LIPASE, AMYLASE,  in the last 168 hours No results found for this basename: AMMONIA,  in the last 168 hours CBC:  Recent Labs Lab 04/28/13 0944 04/29/13 0528 04/30/13 0551  WBC 13.5* 7.3 10.5  NEUTROABS 12.4*  --  8.0*  HGB 12.6 10.0* 7.6*  HCT 37.8 29.8* 21.5*  MCV 86.3 85.9 86.0  PLT 239 178 193   Cardiac Enzymes: No results found for this basename: CKTOTAL, CKMB, CKMBINDEX, TROPONINI,  in the last 168 hours BNP (last 3 results)  Recent Labs  01/28/13 1813 01/28/13  2310  PROBNP 75.5 80.2   CBG: No results found for this basename: GLUCAP,  in the last 168 hours  Recent Results (from the past 240 hour(s))  SURGICAL PCR SCREEN     Status: None   Collection Time    04/28/13  3:55 PM      Result Value Range Status   MRSA, PCR NEGATIVE  NEGATIVE Final   Staphylococcus aureus NEGATIVE  NEGATIVE Final   Comment:            The Xpert SA Assay (FDA     approved for NASAL specimens     in patients over 13 years of age),     is one component of     a comprehensive surveillance     program.  Test performance has     been validated by [employer] for patients greater     than or equal to 29 year old.     It is not intended     to  diagnose infection nor to     guide or monitor treatment.     Studies: Dg Hip Operative Left  04/29/2013   *RADIOLOGY REPORT*  Clinical Data: ORIF left hip fracture  OPERATIVE LEFT HIP  Comparison: Three digital intraoperative C-arm fluoroscopic images compared to 04/28/2013  Findings: Technique:  C-arm fluoroscopic images were obtained intraoperatively and submitted for postoperative interpretation. Please see the performing provider's procedural report for the fluoroscopy time utilized.  Lateral plate with compression screw identified at proximal left femur post ORIF of an intertrochanteric fracture. Diffuse osseous demineralization. No femoral head dislocation identified. Four screws are seen at the plate along the proximal femoral diaphysis. No additional fracture identified.  IMPRESSION: Post ORIF of intertrochanteric fracture of the proximal left femur.   Original Report Authenticated By: Ulyses Southward, M.D.    Scheduled Meds: . acetaminophen  1,000 mg Intravenous Q6H  . albuterol  2.5 mg Nebulization Q6H  . enoxaparin (LOVENOX) injection  40 mg Subcutaneous Q24H  . ipratropium  0.5 mg Nebulization Q6H  . lisinopril  40 mg Oral Daily   Continuous Infusions: . sodium chloride 75 mL/hr at 04/29/13 1845    Principal Problem:   Hip fracture Active Problems:   HTN (hypertension)   Osteoporosis, unspecified   History of tobacco use   Diastolic dysfunction   Leukocytosis   Acute blood loss anemia    Time spent: 30 minutes    [facility] M  [facility]  If 7PM-7AM, please contact night-coverage at [email or web address], password [facility] 04/30/2013, 9:26 AM  LOS: 2 days    Attending Note:  Patient seen and independently examined. Above note reviewed.  She is doing quite well today and does not have any new complaints. She had gotten out of bed into chair with physical therapy today.  Hemoglobin was found to be low today, likely secondary to blood loss during surgery. She is being  transfused 2 units PRBCs.  Remainder of chronic medical issues appear to be stable.  She will need placement in a skilled nursing facility. We'll plan on discharge when she is cleared by orthopedics.  [name],[name]

## 2013-04-30 NOTE — Op Note (Signed)
Tiffany Maxwell, Tiffany Maxwell             ACCOUNT NO.:  [id number]  MEDICAL RECORD NO.:  [id number]  LOCATION:  A322                          FACILITY:  [facility]  PHYSICIAN:  J. Darreld Mclean, M.D. DATE OF BIRTH:  Apr 19, 1934  DATE OF PROCEDURE: DATE OF DISCHARGE:                              OPERATIVE REPORT   PREOPERATIVE DIAGNOSIS:  Comminuted intertrochanteric fracture of the left hip.  POSTOPERATIVE DIAGNOSIS:  Comminuted intertrochanteric fracture of the left hip.  PROCEDURE:  Open treatment internal fixation of left hip fracture using Smith and Nephew compression hip system with an 85 mm compression screw, 135 degree short barrel sideplate screws measuring from 40 mm to 36 mm. Compression was used on the system.  ANESTHESIA:  Spinal.  SURGEON:  J. Darreld Mclean, MD.  DRAINS:  One large Hemovac.  INDICATIONS:  The patient fell yesterday and injured her left hip.  X- rays revealed this in the emergency room.  The patient was admitted, seen by the hospitalist, found to be candidate.  She is at slightly increased risk because of COPD and age.  Risks and imponderables were discussed with the patient and family prior to procedure.  They appeared to understand and agreed to the procedure as outlined.  DESCRIPTION OF PROCEDURE:  The patient was seen in the holding area. She identified the left hip as correct surgical site.  I placed a mark on the left hip.  She was brought to the operating room, given spinal anesthesia.  She was transferred to the fracture table.  The C-arm fluoroscopy unit was brought in.  Everyone had lead aprons, lead thyroid sheilds, x-ray batches.  Scout film was taken with good AP and lateral views.  The patient was then prepped and draped in usual manner.  We had a time-out identifying the patient as Tiffany Maxwell and that we were doing the left hip surgery for intertrochanteric fracture of the left hip.  All instrumentation properly positioned and working.  The  OR team knew each other.  Incision made through skin, subcutaneous tissue, tensor fascia lata, vastus lateralis.  Incision was made through the vastus lateralis, there was a very large lipoma that literally popped up.  I went ahead and removed it in toto.  Femoral shaft was identified.  Guide pin placed and looked good in AP and lateral views.  It was measured as 90 mm long. An 85 mm compression screw was selected.  Step drill was used and the compression screw was then inserted.  A 135-degree short barrel side plate 4 holes were used.  Compression was applied to the system with 40 mm screw initially most proximally and then 38, 36, and 36.  Permanent x- rays were taken and these looked good in AP and lateral views.  Fracture was well reduced.  Hemovac drain was placed, sewn in with 2-0 silk suture.  The vastus lateralis was reapproximated using a running locking #1 Surgilon suture.  Tensor fascia lata was reapproximated using interrupted figure-of-eight #1 Surgilon suture.  Subcutaneous tissue was closed using 2-0 plain.  Skin staples were used on the skin.  Sterile dressing applied.  The patient tolerated the procedure well, will go to recovery in good condition.  ______________________________ Shela Commons. Darreld Mclean, M.D.     JWK/MEDQ  D:  04/29/2013  T:  04/30/2013  Job:  [id number]

## 2013-04-30 NOTE — Evaluation (Signed)
Physical Therapy Evaluation Patient Details Name: Tiffany Maxwell MRN: [id number] DOB: [birth year] Today's Date: 04/30/2013 Time:1030  - 1110no    PT Assessment / Plan / Recommendation Clinical Impression  Pt admitted with a fractured hip.  Pt had been falling at home prior to the last fall that fractured her hip.  Pt has decreased ROM decreased strength, decreased balance and decreased mobility.  Pt will benefit from skilled PT to improve her I and functional ability.     PT Assessment  Patient needs continued PT services    Follow Up Recommendations  SNF    Does the patient have the potential to tolerate intense rehabilitation    no  Barriers to Discharge   none    Equipment Recommendations  Rolling walker with 5" wheels    Recommendations for Other Services OT consult   Frequency 7X/week    Precautions / Restrictions Precautions Precautions: Fall Restrictions Weight Bearing Restrictions: Yes LLE Weight Bearing: Touchdown weight bearing   Pertinent Vitals/Pain 5/10      Mobility  Bed Mobility Bed Mobility: Supine to Sit Supine to Sit: 3: Mod assist Transfers Transfers: Sit to Stand;Stand to Sit;Stand Pivot Transfers Sit to Stand: 3: Mod assist Stand to Sit: 4: Min assist Stand Pivot Transfers: 3: Mod assist Ambulation/Gait Ambulation/Gait Assistance: Not tested (comment)    Exercises General Exercises - Lower Extremity Ankle Circles/Pumps: Both;10 reps Quad Sets: Both;10 reps Gluteal Sets: Both;10 reps   PT Diagnosis: Difficulty walking;Generalized weakness  PT Problem List: Decreased strength;Decreased range of motion;Decreased activity tolerance;Decreased mobility;Pain;Decreased knowledge of use of DME PT Treatment Interventions: Gait training;Functional mobility training;Therapeutic exercise   PT Goals Acute Rehab PT Goals PT Goal Formulation: With patient Time For Goal Achievement: 05/05/13 Potential to Achieve Goals: Good Pt will go Supine/Side  to Sit: with min assist PT Goal: Supine/Side to Sit - Progress: Goal set today Pt will go Sit to Stand: with min assist PT Goal: Sit to Stand - Progress: Goal set today Pt will go Stand to Sit: with min assist PT Goal: Stand to Sit - Progress: Goal set today Pt will Transfer Bed to Chair/Chair to Bed: with min assist PT Transfer Goal: Bed to Chair/Chair to Bed - Progress: Goal set today Pt will Stand: with modified independence;with bilateral upper extremity support;1 - 2 min PT Goal: Stand - Progress: Goal set today Pt will Ambulate: 16 - 50 feet;with supervision PT Goal: Ambulate - Progress: Goal set today  Visit Information  Last PT Received On: 04/30/13    Subjective Data  Subjective: Pt states that she does not use an assistive device at home although she should be as she falls quite a bit. Patient Stated Goal: To be walking again.   Prior Functioning  Home Living Lives With: Spouse Available Help at Discharge: Family Type of Home: House Home Access: Ramped entrance Home Layout: One level Bathroom Shower/Tub: Teacher, adult education: Shower chair with back Prior Function Level of Independence: Independent Able to Take Stairs?: No Vocation: Retired Musician: No difficulties    Copywriter, advertising Arousal/Alertness: Awake/alert Overall Cognitive Status: Within Functional Limits for tasks assessed    Extremity/Trunk Assessment Right Lower Extremity Assessment RLE ROM/Strength/Tone: [facility] for tasks assessed Left Lower Extremity Assessment LLE ROM/Strength/Tone: Unable to fully assess;Due to pain   Balance    End of Session PT - End of Session Equipment Utilized During Treatment: Gait belt;Oxygen Activity Tolerance: Patient limited by pain Patient left: in chair;with call bell/phone within reach;with family/visitor  present Nurse Communication: Mobility status  GP     RUSSELL,CINDY 04/30/2013, 10:55 AM

## 2013-04-30 NOTE — Clinical Social Work Placement (Signed)
Clinical Social Work Department CLINICAL SOCIAL WORK PLACEMENT NOTE 04/30/2013  Patient:  Tiffany Maxwell, Tiffany Maxwell  Account Number:  [id number] Admit date:  04/28/2013  Clinical Social Worker:  Derenda Fennel, LCSW  Date/time:  04/29/2013 02:15 PM  Clinical Social Work is seeking post-discharge placement for this patient at the following level of care:   SKILLED NURSING   (*CSW will update this form in Epic as items are completed)   04/29/2013  Patient/family provided with [facility] Department of Clinical Social Work's list of facilities offering this level of care within the geographic area requested by the patient (or if unable, by the patient's family).  04/29/2013  Patient/family informed of their freedom to choose among providers that offer the needed level of care, that participate in Medicare, Medicaid or managed care program needed by the patient, have an available bed and are willing to accept the patient.  04/29/2013  Patient/family informed of [facility]' ownership interest in [facility], as well as of the fact that they are under no obligation to receive care at this facility.  PASARR submitted to EDS on 04/29/2013 PASARR number received from EDS on 04/29/2013  FL2 transmitted to all facilities in geographic area requested by pt/family on  04/29/2013 FL2 transmitted to all facilities within larger geographic area on   Patient informed that his/her managed care company has contracts with or will negotiate with  certain facilities, including the following:     Patient/family informed of bed offers received:  04/30/2013 Patient chooses bed at [facility] Physician recommends and patient chooses bed at  [facility]  Patient to be transferred to  on   Patient to be transferred to facility by   The following physician request were entered in Epic:   Additional Comments:  Derenda Fennel, LCSW [phone]

## 2013-05-01 DIAGNOSIS — M810 Age-related osteoporosis without current pathological fracture: Secondary | ICD-10-CM | POA: Diagnosis not present

## 2013-05-01 DIAGNOSIS — D62 Acute posthemorrhagic anemia: Secondary | ICD-10-CM | POA: Diagnosis not present

## 2013-05-01 DIAGNOSIS — I10 Essential (primary) hypertension: Secondary | ICD-10-CM | POA: Diagnosis not present

## 2013-05-01 DIAGNOSIS — S72009A Fracture of unspecified part of neck of unspecified femur, initial encounter for closed fracture: Secondary | ICD-10-CM | POA: Diagnosis not present

## 2013-05-01 LAB — CBC WITH DIFFERENTIAL/PLATELET
Basophils Relative: 0 % (ref 0–1)
Hemoglobin: 9.4 g/dL — ABNORMAL LOW (ref 12.0–15.0)
Lymphs Abs: 1.6 10*3/uL (ref 0.7–4.0)
MCHC: 34.9 g/dL (ref 30.0–36.0)
Monocytes Relative: 12 % (ref 3–12)
Neutro Abs: 7.3 10*3/uL (ref 1.7–7.7)
Neutrophils Relative %: 72 % (ref 43–77)
Platelets: 171 10*3/uL (ref 150–400)
RBC: 3.17 MIL/uL — ABNORMAL LOW (ref 3.87–5.11)

## 2013-05-01 LAB — TYPE AND SCREEN
ABO/RH(D): A POS
Unit division: 0

## 2013-05-01 LAB — BASIC METABOLIC PANEL
BUN: 11 mg/dL (ref 6–23)
Chloride: 96 mEq/L (ref 96–112)
GFR calc Af Amer: >90 mL/min (ref >90)
GFR calc non Af Amer: >90 mL/min (ref >90)
Potassium: 3.4 mEq/L — ABNORMAL LOW (ref 3.5–5.1)
Sodium: 133 mEq/L — ABNORMAL LOW (ref 135–145)

## 2013-05-01 MED ORDER — POTASSIUM CHLORIDE CRYS ER 20 MEQ PO TBCR
40.0000 meq | EXTENDED_RELEASE_TABLET | Freq: Once | ORAL | Status: AC
  Administered 2013-05-01: 40 meq via ORAL
  Filled 2013-05-01: qty 2

## 2013-05-01 MED ORDER — SODIUM CHLORIDE 0.9 % IV SOLN
INTRAVENOUS | Status: DC
  Administered 2013-05-01: 10:00:00 via INTRAVENOUS

## 2013-05-01 NOTE — Progress Notes (Signed)
Physical Therapy Treatment Patient Details Name: Tiffany Maxwell MRN: [id number] DOB: 1933/12/16 Today's Date: 05/01/2013 Time: 1400-1420 PT Time Calculation (min): 20 min  PT Assessment / Plan / Recommendation Comments on Treatment Session  Pt has difficulty following directions with therex. Pt tends to push against therapist when attempting to give assistance. Pt is pleasant and cooperative. Pt displays good quad contraction with quad sets. Mobility training not completed as pt had just recently gotten back into bed.                      Plan  (Continue per PT POC.)    Precautions / Restrictions Precautions Precautions: Fall Restrictions Weight Bearing Restrictions: Yes LLE Weight Bearing: Touchdown weight bearing       Mobility  Bed Mobility Bed Mobility: Supine to Sit Supine to Sit: 3: Mod assist Transfers Transfers: Sit to Stand;Stand to Sit Sit to Stand: 3: Mod assist Stand to Sit: 4: Min assist Stand Pivot Transfers: 3: Mod assist Details for Transfer Assistance: Pt educated on weight bearing restrictions but unable to demonstrate appropriate weight precautions.  Did stand at bedside x 1 one minute with UE support on RW, no transfer complete due to pt not following weight bearing restrictions    Exercises Total Joint Exercises Ankle Circles/Pumps: AROM;Both;10 reps Quad Sets: AROM;Both;10 reps Heel Slides: AAROM;Left;10 reps General Exercises - Lower Extremity Ankle Circles/Pumps: Both;10 reps;Supine;AROM Quad Sets: 10 reps;Supine;Left;AROM Short Arc Quad: AAROM;10 reps;Supine;Left Heel Slides: AAROM;10 reps;Supine;Left    PT Goals Acute Rehab PT Goals PT Goal: Supine/Side to Sit - Progress: Progressing toward goal PT Goal: Sit to Stand - Progress: Progressing toward goal PT Goal: Stand to Sit - Progress: Progressing toward goal PT Transfer Goal: Bed to Chair/Chair to Bed - Progress: Progressing toward goal PT Goal: Stand - Progress: Progressing toward  goal PT Goal: Ambulate - Progress: Not met  Visit Information  Last PT Received On: 05/01/13    Subjective Data  Subjective: I feel like I'm doing better.   Cognition  Cognition Arousal/Alertness: Awake/alert Behavior During Therapy: WFL for tasks assessed/performed Overall Cognitive Status: Within Functional Limits for tasks assessed       End of Session PT - End of Session Equipment Utilized During Treatment: Gait belt;Oxygen Activity Tolerance: Patient tolerated treatment well Patient left: in bed;with call bell/phone within reach;with bed alarm set Nurse Communication: Mobility status   Seth Bake, PTA  05/01/2013, 2:52 PM

## 2013-05-01 NOTE — Progress Notes (Addendum)
Subjective: 2 Days Post-Op Procedure(s) (LRB): COMPRESSION HIP (Left)  Objective: Vital signs in last 24 hours: Temp:  [97.7 F (36.5 C)-98.8 F (37.1 C)] 98.8 F (37.1 C) (05/22 0500) Pulse Rate:  [94-112] 96 (05/22 0500) Resp:  [14-20] 18 (05/22 0500) BP: (107-164)/(60-78) 161/78 mmHg (05/22 0500) SpO2:  [86 %-100 %] 94 % (05/22 0652)  Intake/Output from previous day: 05/21 0701 - 05/22 0700 In: 1925 [I.V.:918.3; Blood:1006.7] Out: 855 [Urine:850; Drains:5] Intake/Output this shift:     Recent Labs  04/28/13 0944 04/29/13 0528 04/30/13 0551 05/01/13 0533  HGB 12.6 10.0* 7.6* 9.4*    Recent Labs  04/30/13 0551 05/01/13 0533  WBC 10.5 10.3  RBC 2.50* 3.17*  HCT 21.5* 26.9*  PLT 193 171    Recent Labs  04/30/13 0551 05/01/13 0533  NA 134* 133*  K 3.6 3.4*  CL 97 96  CO2 31 30  BUN 10 11  CREATININE 0.45* 0.39*  GLUCOSE 127* 98  CALCIUM 9.1 8.5   No results found for this basename: LABPT, INR,  in the last 72 hours   Drain was removed wound looked clean incision was clean dry and intact with minimal drainage  Continue therapy hemoglobin has returned to 9.4  Continue monitor   [employer] 05/01/2013, 7:42 AM

## 2013-05-01 NOTE — Progress Notes (Signed)
[facility] PROGRESS NOTE  Tiffany Maxwell WUJ:[id number] DOB: Feb 22, 1934 DOA: 04/28/2013 PCP: Rudi Heap, MD  Assessment/Plan:  Hip fracture: due to mechanical fall. Post op day 2. Pt reports fair pain control. Defer management to orthopedics. Provide pain med as needed. Up with PT  Active Problems:   Acute blood loss anemia: likely due to surgery. S/P 2 units PRBC'c on 04/30/13. No s/sx active bleeding. Hg 9.4 this am. Monitor   HTN (hypertension): Fair control.  Continue lisinopril and lasix. Will provide hydralazine prn and pain med.   Osteoporosis, unspecified: see #1. Continue calcium   COPD, oxygen depenent. Remains stable at this time. Continue nebulizer treatments and oxygen. Encouraged incentive spirometry.   Diastolic dysfunction: grade 1. Recent echo with EF 60%. Stable.   Leukocytosis: likely reactive . Resolved.  Hypokalemia: mild. Will replete. recheck   Hyponatremia: mild. Likely related to decreased po intake. Monitor      Code Status: full Family Communication:  Disposition Plan: SNF when ortho clears, hopefully tomorrow   Consultants:  Ortho  Procedures:  Repair left hip fx 04/29/13  Antibiotics:  none  HPI/Subjective: Up with PT. Difficulty not bearing wt on left. Pain controlled. Some SOB with exertion  Objective: Filed Vitals:   05/01/13 0102 05/01/13 0500 05/01/13 0652 05/01/13 0821  BP:  161/78    Pulse:  96  96  Temp:  98.8 F (37.1 C)    TempSrc:  Oral    Resp:  18  17  Height:      Weight:      SpO2: 95% 86% 94% 94%    Intake/Output Summary (Last 24 hours) at 05/01/13 0955 Last data filed at 05/01/13 0600  Gross per 24 hour  Intake   1925 ml  Output    855 ml  Net   1070 ml   Filed Weights   04/28/13 0806 04/28/13 1224  Weight: 60.328 kg (133 lb) 58.423 kg (128 lb 12.8 oz)    Exam:   General:  Awake alert nAD  Cardiovascular: RRR no MGR No LE edema  Respiratory: mild increased work of breathing with  exertion. BS somewhat distant but clear  Abdomen: soft +BS non-tender to palpation  Musculoskeletal: no clubbing no cyanosis. Left hip incision clean dry. Drain removed per ortho this am.    Data Reviewed: Basic Metabolic Panel:  Recent Labs Lab 04/28/13 0944 04/29/13 0528 04/30/13 0551 05/01/13 0533  NA 135 134* 134* 133*  K 3.5 3.4* 3.6 3.4*  CL 94* 97 97 96  CO2 34* 32 31 30  GLUCOSE 143* 107* 127* 98  BUN 4* 6 10 11   CREATININE 0.42* 0.45* 0.45* 0.39*  CALCIUM 9.6 8.8 9.1 8.5   Liver Function Tests:  Recent Labs Lab 04/28/13 0944  AST 16  ALT 6  ALKPHOS 77  BILITOT 0.4  PROT 6.0  ALBUMIN 3.2*   No results found for this basename: LIPASE, AMYLASE,  in the last 168 hours No results found for this basename: AMMONIA,  in the last 168 hours CBC:  Recent Labs Lab 04/28/13 0944 04/29/13 0528 04/30/13 0551 05/01/13 0533  WBC 13.5* 7.3 10.5 10.3  NEUTROABS 12.4*  --  8.0* 7.3  HGB 12.6 10.0* 7.6* 9.4*  HCT 37.8 29.8* 21.5* 26.9*  MCV 86.3 85.9 86.0 84.9  PLT 239 178 193 171   Cardiac Enzymes: No results found for this basename: CKTOTAL, CKMB, CKMBINDEX, TROPONINI,  in the last 168 hours BNP (last 3 results)  Recent Labs  01/28/13  1813 01/28/13 2310  PROBNP 75.5 80.2   CBG: No results found for this basename: GLUCAP,  in the last 168 hours  Recent Results (from the past 240 hour(s))  SURGICAL PCR SCREEN     Status: None   Collection Time    04/28/13  3:55 PM      Result Value Range Status   MRSA, PCR NEGATIVE  NEGATIVE Final   Staphylococcus aureus NEGATIVE  NEGATIVE Final   Comment:            The Xpert SA Assay (FDA     approved for NASAL specimens     in patients over 55 years of age),     is one component of     a comprehensive surveillance     program.  Test performance has     been validated by [employer] for patients greater     than or equal to 14 year old.     It is not intended     to diagnose infection nor to     guide or  monitor treatment.     Studies: Dg Hip Operative Left  04/29/2013   *RADIOLOGY REPORT*  Clinical Data: ORIF left hip fracture  OPERATIVE LEFT HIP  Comparison: Three digital intraoperative C-arm fluoroscopic images compared to 04/28/2013  Findings: Technique:  C-arm fluoroscopic images were obtained intraoperatively and submitted for postoperative interpretation. Please see the performing provider's procedural report for the fluoroscopy time utilized.  Lateral plate with compression screw identified at proximal left femur post ORIF of an intertrochanteric fracture. Diffuse osseous demineralization. No femoral head dislocation identified. Four screws are seen at the plate along the proximal femoral diaphysis. No additional fracture identified.  IMPRESSION: Post ORIF of intertrochanteric fracture of the proximal left femur.   Original Report Authenticated By: Ulyses Southward, M.D.    Scheduled Meds: . albuterol  2.5 mg Nebulization Q6H  . enoxaparin (LOVENOX) injection  40 mg Subcutaneous Q24H  . furosemide  20 mg Oral Daily  . ipratropium  0.5 mg Nebulization Q6H  . lisinopril  40 mg Oral Daily  . potassium chloride  40 mEq Oral Once   Continuous Infusions: . sodium chloride      Principal Problem:   Hip fracture Active Problems:   HTN (hypertension)   Osteoporosis, unspecified   History of tobacco use   Diastolic dysfunction   Leukocytosis   Acute blood loss anemia    Time spent: 30 minutes    [facility] M  [facility] Pager [phone]. If 7PM-7AM, please contact night-coverage at [email or web address], password [facility] 05/01/2013, 9:55 AM  LOS: 3 days   Attending note:  Patient seen and independently examined. Above note reviewed and amended.   Patient appears to be doing fairly well postoperatively. Continue physical therapy. Hemoglobin has responded to PRBC transfusions. Hopeful for discharge to skilled nursing facility soon when cleared by  orthopedics.  [name],[name]

## 2013-05-01 NOTE — Progress Notes (Signed)
Physical Therapy Treatment Patient Details Name: Tiffany Maxwell MRN: [id number] DOB: 1934-10-23 Today's Date: 05/01/2013 Time: 8469-6295 PT Time Calculation (min): 35 min  PT Assessment / Plan / Recommendation Comments on Treatment Session  Therex for LE strengthening and ROM with AAROM for heel slides.  Pt educated on weight bearing precautions and techniques to follow the precaution.  Mod assistance with supine to sit and sit to stand with cueing for handplacement to assist  Pt required max cueing to reduce weight bearing Lt LE to TTWB.  No gait or transfer complete this session due to pt unable to follow precaution    Follow Up Recommendations        Does the patient have the potential to tolerate intense rehabilitation     Barriers to Discharge        Equipment Recommendations       Recommendations for Other Services    Frequency     Plan      Precautions / Restrictions Precautions Precautions: Fall Restrictions Weight Bearing Restrictions: Yes LLE Weight Bearing: Touchdown weight bearing    Mobility  Bed Mobility Bed Mobility: Supine to Sit Supine to Sit: 3: Mod assist Transfers Transfers: Sit to Stand;Stand to Sit Sit to Stand: 3: Mod assist Stand to Sit: 4: Min assist Stand Pivot Transfers: 3: Mod assist Details for Transfer Assistance: Pt educated on weight bearing restrictions but unable to demonstrate appropriate weight precautions.  Did stand at bedside x 1 one minute with UE support on RW, no transfer complete due to pt not following weight bearing restrictions    Exercises Total Joint Exercises Ankle Circles/Pumps: AROM;Both;10 reps Quad Sets: AROM;Both;10 reps Heel Slides: AAROM;Left;10 reps   PT Diagnosis:    PT Problem List:   PT Treatment Interventions:     PT Goals Acute Rehab PT Goals PT Goal: Supine/Side to Sit - Progress: Progressing toward goal PT Goal: Sit to Stand - Progress: Progressing toward goal PT Goal: Stand to Sit - Progress:  Progressing toward goal PT Transfer Goal: Bed to Chair/Chair to Bed - Progress: Progressing toward goal PT Goal: Stand - Progress: Progressing toward goal PT Goal: Ambulate - Progress: Not met  Visit Information  Last PT Received On: 05/01/13    Subjective Data  Subjective: I talk to forgot about the pain.  Feeling a little better than I did yesterday.   Cognition  Cognition Arousal/Alertness: Awake/alert Behavior During Therapy: WFL for tasks assessed/performed Overall Cognitive Status: Within Functional Limits for tasks assessed    Balance     End of Session PT - End of Session Equipment Utilized During Treatment: Gait belt;Oxygen Activity Tolerance: Patient limited by pain Patient left: in bed;with call bell/phone within reach;with bed alarm set Nurse Communication: Mobility status   GP     Tiffany Maxwell 05/01/2013, 12:22 PM

## 2013-05-02 ENCOUNTER — Inpatient Hospital Stay
Admission: RE | Admit: 2013-05-02 | Discharge: 2013-06-09 | Disposition: A | Discharge status: Home / Self Care | Payer: Self-pay | Source: Ambulatory Visit | Attending: Internal Medicine | Admitting: Internal Medicine

## 2013-05-02 DIAGNOSIS — J449 Chronic obstructive pulmonary disease, unspecified: Secondary | ICD-10-CM | POA: Diagnosis not present

## 2013-05-02 DIAGNOSIS — I10 Essential (primary) hypertension: Secondary | ICD-10-CM | POA: Diagnosis not present

## 2013-05-02 DIAGNOSIS — Z9181 History of falling: Secondary | ICD-10-CM | POA: Diagnosis not present

## 2013-05-02 DIAGNOSIS — S72143A Displaced intertrochanteric fracture of unspecified femur, initial encounter for closed fracture: Secondary | ICD-10-CM | POA: Diagnosis not present

## 2013-05-02 DIAGNOSIS — M810 Age-related osteoporosis without current pathological fracture: Secondary | ICD-10-CM | POA: Diagnosis not present

## 2013-05-02 DIAGNOSIS — T148XXA Other injury of unspecified body region, initial encounter: Principal | ICD-10-CM

## 2013-05-02 DIAGNOSIS — IMO0002 Reserved for concepts with insufficient information to code with codable children: Secondary | ICD-10-CM | POA: Diagnosis not present

## 2013-05-02 DIAGNOSIS — M6281 Muscle weakness (generalized): Secondary | ICD-10-CM | POA: Diagnosis not present

## 2013-05-02 DIAGNOSIS — R41841 Cognitive communication deficit: Secondary | ICD-10-CM | POA: Diagnosis not present

## 2013-05-02 DIAGNOSIS — R262 Difficulty in walking, not elsewhere classified: Secondary | ICD-10-CM | POA: Diagnosis not present

## 2013-05-02 DIAGNOSIS — J441 Chronic obstructive pulmonary disease with (acute) exacerbation: Secondary | ICD-10-CM | POA: Diagnosis not present

## 2013-05-02 DIAGNOSIS — D62 Acute posthemorrhagic anemia: Secondary | ICD-10-CM | POA: Diagnosis not present

## 2013-05-02 DIAGNOSIS — S72009D Fracture of unspecified part of neck of unspecified femur, subsequent encounter for closed fracture with routine healing: Secondary | ICD-10-CM | POA: Diagnosis not present

## 2013-05-02 DIAGNOSIS — S72009A Fracture of unspecified part of neck of unspecified femur, initial encounter for closed fracture: Secondary | ICD-10-CM | POA: Diagnosis not present

## 2013-05-02 LAB — CBC WITH DIFFERENTIAL/PLATELET
HCT: 25.7 % — ABNORMAL LOW (ref 36.0–46.0)
Hemoglobin: 8.8 g/dL — ABNORMAL LOW (ref 12.0–15.0)
Lymphocytes Relative: 14 % (ref 12–46)
Lymphs Abs: 1 10*3/uL (ref 0.7–4.0)
MCHC: 34.2 g/dL (ref 30.0–36.0)
Monocytes Absolute: 0.8 10*3/uL (ref 0.1–1.0)
Monocytes Relative: 11 % (ref 3–12)
Neutro Abs: 5.4 10*3/uL (ref 1.7–7.7)
Neutrophils Relative %: 74 % (ref 43–77)
RBC: 2.95 MIL/uL — ABNORMAL LOW (ref 3.87–5.11)

## 2013-05-02 LAB — BASIC METABOLIC PANEL
BUN: 8 mg/dL (ref 6–23)
CO2: 34 mEq/L — ABNORMAL HIGH (ref 19–32)
Chloride: 95 mEq/L — ABNORMAL LOW (ref 96–112)
Creatinine, Ser: 0.37 mg/dL — ABNORMAL LOW (ref 0.50–1.10)
GFR calc Af Amer: >90 mL/min (ref >90)
Glucose, Bld: 108 mg/dL — ABNORMAL HIGH (ref 70–99)
Potassium: 3.8 mEq/L (ref 3.5–5.1)

## 2013-05-02 MED ORDER — ENOXAPARIN SODIUM 40 MG/0.4ML ~~LOC~~ SOLN: 30.0000 mg | SUBCUTANEOUS | Status: DC

## 2013-05-02 MED ORDER — FERROUS SULFATE 325 (65 FE) MG PO TABS: 325.0000 mg | ORAL_TABLET | Freq: Every day | ORAL | Status: DC

## 2013-05-02 MED ORDER — OXYCODONE-ACETAMINOPHEN 5-325 MG PO TABS: 1.0000 | ORAL_TABLET | ORAL | Status: DC | PRN

## 2013-05-02 NOTE — Progress Notes (Signed)
     Subjective: This lady appears to be doing well. Pain seems to be controlled. She says she has not mobilized much. She has COPD and is oxygen dependent at home, receiving 2-3 L of oxygen per minute           Physical Exam: Blood pressure 155/79, pulse 89, temperature 97.8 F (36.6 C), temperature source Oral, resp. rate 20, height 5' (1.524 m), weight 58.423 kg (128 lb 12.8 oz), SpO2 95.00%. She looks systemically well. There is no respiratory distress. Lung fields are clear. There is no wheezing. Heart sounds are present and normal. She is alert and orientated.   Investigations:  Recent Results (from the past 240 hour(s))  SURGICAL PCR SCREEN     Status: None   Collection Time    04/28/13  3:55 PM      Result Value Range Status   MRSA, PCR NEGATIVE  NEGATIVE Final   Staphylococcus aureus NEGATIVE  NEGATIVE Final   Comment:            The Xpert SA Assay (FDA     approved for NASAL specimens     in patients over 92 years of age),     is one component of     a comprehensive surveillance     program.  Test performance has     been validated by [employer] for patients greater     than or equal to 33 year old.     It is not intended     to diagnose infection nor to     guide or monitor treatment.     Basic Metabolic Panel:  Recent Labs  13/24/40 0533 05/02/13 0550  NA 133* 134*  K 3.4* 3.8  CL 96 95*  CO2 30 34*  GLUCOSE 98 108*  BUN 11 8  CREATININE 0.39* 0.37*  CALCIUM 8.5 8.7       CBC:  Recent Labs  05/01/13 0533 05/02/13 0550  WBC 10.3 7.3  NEUTROABS 7.3 5.4  HGB 9.4* 8.8*  HCT 26.9* 25.7*  MCV 84.9 87.1  PLT 171 206    No results found.    Medications: I have reviewed the patient's current medications.  Impression: 1. Left hip fracture, status post surgery. 2. COPD, stable. 3. Acute blood loss anemia, hemoglobin stable at 8.8. Status post 2 units blood transfusion. 4. Hypertension.     Plan: 1. Continue with  current therapy. 2. Await orthopedic opinion regarding discharge. She is medically stable for discharge from my point of view.     LOS: 4 days   Wilson Singer Pager [phone]  05/02/2013, 9:53 AM

## 2013-05-02 NOTE — Clinical Social Work Placement (Signed)
Clinical Social Work Department CLINICAL SOCIAL WORK PLACEMENT NOTE 05/02/2013  Patient:  Tiffany Maxwell, Tiffany Maxwell  Account Number:  [id number] Admit date:  04/28/2013  Clinical Social Worker:  Derenda Fennel, LCSW  Date/time:  04/29/2013 02:15 PM  Clinical Social Work is seeking post-discharge placement for this patient at the following level of care:   SKILLED NURSING   (*CSW will update this form in Epic as items are completed)   04/29/2013  Patient/family provided with [facility] Department of Clinical Social Work's list of facilities offering this level of care within the geographic area requested by the patient (or if unable, by the patient's family).  04/29/2013  Patient/family informed of their freedom to choose among providers that offer the needed level of care, that participate in Medicare, Medicaid or managed care program needed by the patient, have an available bed and are willing to accept the patient.  04/29/2013  Patient/family informed of [facility]' ownership interest in [facility], as well as of the fact that they are under no obligation to receive care at this facility.  PASARR submitted to EDS on 04/29/2013 PASARR number received from EDS on 04/29/2013  FL2 transmitted to all facilities in geographic area requested by pt/family on  04/29/2013 FL2 transmitted to all facilities within larger geographic area on   Patient informed that his/her managed care company has contracts with or will negotiate with  certain facilities, including the following:     Patient/family informed of bed offers received:  04/30/2013 Patient chooses bed at [facility] Physician recommends and patient chooses bed at  [facility]  Patient to be transferred to [facility] on  05/02/2013 Patient to be transferred to facility by RN  The following physician request were entered in Epic:   Additional Comments:  Derenda Fennel, LCSW [phone]

## 2013-05-02 NOTE — Progress Notes (Signed)
05/02/13 1352 Patient discharged to [facility] this afternoon. Called report to receiving nurse at [facility]. IV site d/c'd and site within normal limits. Pt left floor in stable condition via bed accompanied by two nurse techs. Earnstine Regal, RN

## 2013-05-02 NOTE — Discharge Summary (Signed)
Physician Discharge Summary  Tiffany Maxwell BJY:[id number] DOB: 1934-05-17 DOA: 04/28/2013  PCP: Rudi Heap, MD  Admit date: 04/28/2013 Discharge date: 05/02/2013  Time spent: Greater than 30 minutes  Recommendations for Outpatient Follow-up:  Hold lovenox x 24 hrs  Repeat hg tomorrow  Start iron  Toe touch weight bearing  Resume lovenox tomorrow continue until June 23  F/u Dr Hilda Lias in 1 month  Remove staples post op day 14 .  Discharge Diagnoses:  1. Left hip fracture, status post surgery by Dr. Hilda Lias.Open treatment internal fixation of left hip fracture . 2. COPD on oxygen, stable. 3. Hypertension. 4. Acute blood loss anemia, status post 2 units blood transfusion. Hemoglobin slightly decreased today at 8.8. Hold Lovenox today and repeat hemoglobin tomorrow.    Discharge Condition: Stable.  Diet recommendation: Regular.  Filed Weights   04/28/13 0806 04/28/13 1224  Weight: 60.328 kg (133 lb) 58.423 kg (128 lb 12.8 oz)    History of present illness:  This very pleasant 79 year old lady presented to the hospital with symptoms left hip pain following an accidental fall. Please see initial history as outlined below: HPI: Tiffany Maxwell is a 79 y.o. female with pmhx that includes HTN, COPD on home oxygen, osteoporosis, diastolic dysfunction who presents to ED from home with cc fall/left hip pain. Information obtained from pt.states she was taking out trash this am and when she turned about to return to house she tripped, fell onto left side. Denies loss of consciousness, or head trauma. She denies recent illness, fever, chills, anorexia. She denies unintentional wt loss, dysuria, hematuria, melena. She reports feeling sharp pain immediately in left hip and was unable to get up. Symptoms came on suddenly have persisted and characterized as severe. Work up in ED yield left hip xray with comminuted intertrochanteric fracture, BP 174/107, HR 87, white count 13.5. TRH asled to  admit  Hospital Course:  Patient was admitted and seen by orthopedics, Dr. Hilda Lias. He performed left hip surgery, surgery went well without complications. She did have acute blood loss anemia secondary to the left hip fracture and she required 2 units of blood. Her COPD has and stable throughout hospitalization. Her pain has been relatively well controlled with oral opioids. She has done reasonably well with physical therapy. She is now stable for discharge. Her hemoglobin did decrease today from yesterday we are going to hold her Lovenox today. Her hemoglobin should be rechecked tomorrow. She will need to see Dr. Hilda Lias in approximately 4 weeks' time. The postoperative instructions are as above.  Procedures:  Left hip surgery by Dr. Hilda Lias on 04/30/2013.   Consultations:  Orthopedics, Dr. Hilda Lias and Dr. Romeo Apple.  Discharge Exam: Filed Vitals:   05/01/13 2208 05/02/13 0236 05/02/13 0426 05/02/13 0727  BP: 165/75  155/79   Pulse: 92  89   Temp: 97.8 F (36.6 C)  97.8 F (36.6 C)   TempSrc: Oral  Oral   Resp: 20     Height:      Weight:      SpO2: 97% 96% 92% 95%    General: She looks systemically well. Cardiovascular: Heart sounds are present and normal without murmurs or added sounds. Respiratory: Lung fields are clear. She is alert and orientated.  Discharge Instructions  Discharge Orders   Future Orders Complete By Expires     Diet - low sodium heart healthy  As directed     Increase activity slowly  As directed  Medication List    TAKE these medications       albuterol-ipratropium 18-103 MCG/ACT inhaler  Commonly known as:  COMBIVENT  Inhale 2 puffs into the lungs every 6 (six) hours as needed for wheezing.     calcium-vitamin D 500-200 MG-UNIT per tablet  Take 1 tablet by mouth daily.     enoxaparin 40 MG/0.4ML injection  Commonly known as:  LOVENOX  Inject 0.3 mLs (30 mg total) into the skin daily.     ferrous sulfate 325 (65 FE) MG tablet   Commonly known as:  FERROUSUL  Take 1 tablet (325 mg total) by mouth daily with breakfast.     furosemide 20 MG tablet  Commonly known as:  LASIX  Take 20 mg by mouth daily as needed (Fluid).     lisinopril 40 MG tablet  Commonly known as:  PRINIVIL,ZESTRIL  Take 1 tablet (40 mg total) by mouth daily.     oxyCODONE-acetaminophen 5-325 MG per tablet  Commonly known as:  PERCOCET/ROXICET  Take 1-2 tablets by mouth every 4 (four) hours as needed.       Allergies  Allergen Reactions  . Codeine Other (See Comments)    headache  . Benicar Hct (Olmesartan Medoxomil-Hctz) Swelling and Rash  . Naproxen Itching, Rash and Other (See Comments)    Redness/flushing of skin      The results of significant diagnostics from this hospitalization (including imaging, microbiology, ancillary and laboratory) are listed below for reference.    Significant Diagnostic Studies: Dg Chest 1 View  04/28/2013   *RADIOLOGY REPORT*  Clinical Data: Pain post fall  CHEST - 1 VIEW  Comparison: 01/29/2013  Findings: Borderline cardiomegaly.  Atherosclerotic calcifications and tortuosity of the thoracic aorta again noted.  No acute infiltrate or pulmonary edema.  Old right rib fractures.  No diagnostic pneumothorax.  IMPRESSION: Borderline cardiomegaly.  No active disease.   Original Report Authenticated By: Natasha Mead, M.D.   Dg Hip Complete Left  04/28/2013   *RADIOLOGY REPORT*  Clinical Data: Recent fall with left hip pain  LEFT HIP - COMPLETE 2+ VIEW  Comparison: None.  Findings: There is a comminuted fracture of the left intertrochanteric region with some impaction and angulation at the fracture site.  Pelvic ring is intact.  No other focal abnormality is seen.  IMPRESSION: Comminuted left intertrochanteric fracture   Original Report Authenticated By: Alcide Clever, M.D.   Dg Hip Operative Left  04/29/2013   *RADIOLOGY REPORT*  Clinical Data: ORIF left hip fracture  OPERATIVE LEFT HIP  Comparison: Three digital  intraoperative C-arm fluoroscopic images compared to 04/28/2013  Findings: Technique:  C-arm fluoroscopic images were obtained intraoperatively and submitted for postoperative interpretation. Please see the performing provider's procedural report for the fluoroscopy time utilized.  Lateral plate with compression screw identified at proximal left femur post ORIF of an intertrochanteric fracture. Diffuse osseous demineralization. No femoral head dislocation identified. Four screws are seen at the plate along the proximal femoral diaphysis. No additional fracture identified.  IMPRESSION: Post ORIF of intertrochanteric fracture of the proximal left femur.   Original Report Authenticated By: Ulyses Southward, M.D.    Microbiology: Recent Results (from the past 240 hour(s))  SURGICAL PCR SCREEN     Status: None   Collection Time    04/28/13  3:55 PM      Result Value Range Status   MRSA, PCR NEGATIVE  NEGATIVE Final   Staphylococcus aureus NEGATIVE  NEGATIVE Final   Comment:  The Xpert SA Assay (FDA     approved for NASAL specimens     in patients over 34 years of age),     is one component of     a comprehensive surveillance     program.  Test performance has     been validated by [employer] for patients greater     than or equal to 77 year old.     It is not intended     to diagnose infection nor to     guide or monitor treatment.     Labs: Basic Metabolic Panel:  Recent Labs Lab 04/28/13 0944 04/29/13 0528 04/30/13 0551 05/01/13 0533 05/02/13 0550  NA 135 134* 134* 133* 134*  K 3.5 3.4* 3.6 3.4* 3.8  CL 94* 97 97 96 95*  CO2 34* 32 31 30 34*  GLUCOSE 143* 107* 127* 98 108*  BUN 4* 6 10 11 8   CREATININE 0.42* 0.45* 0.45* 0.39* 0.37*  CALCIUM 9.6 8.8 9.1 8.5 8.7   Liver Function Tests:  Recent Labs Lab 04/28/13 0944  AST 16  ALT 6  ALKPHOS 77  BILITOT 0.4  PROT 6.0  ALBUMIN 3.2*     CBC:  Recent Labs Lab 04/28/13 0944 04/29/13 0528 04/30/13 0551  05/01/13 0533 05/02/13 0550  WBC 13.5* 7.3 10.5 10.3 7.3  NEUTROABS 12.4*  --  8.0* 7.3 5.4  HGB 12.6 10.0* 7.6* 9.4* 8.8*  HCT 37.8 29.8* 21.5* 26.9* 25.7*  MCV 86.3 85.9 86.0 84.9 87.1  PLT 239 178 193 171 206     BNP: BNP (last 3 results)  Recent Labs  01/28/13 1813 01/28/13 2310  PROBNP 75.5 80.2        Signed:  Jaeline Whobrey C  [facility] 05/02/2013, 10:15 AM

## 2013-05-02 NOTE — Progress Notes (Signed)
Subjective: 3 Days Post-Op Procedure(s) (LRB): COMPRESSION HIP (Left)  Objective: Vital signs in last 24 hours: Temp:  [97.8 F (36.6 C)-98.4 F (36.9 C)] 97.8 F (36.6 C) (05/23 0426) Pulse Rate:  [89-96] 89 (05/23 0426) Resp:  [17-20] 20 (05/22 2208) BP: (122-165)/(74-79) 155/79 mmHg (05/23 0426) SpO2:  [92 %-97 %] 92 % (05/23 0426)  Intake/Output from previous day: 05/22 0701 - 05/23 0700 In: 953.3 [P.O.:600; I.V.:353.3] Out: 2000 [Urine:2000] Intake/Output this shift:     Recent Labs  04/30/13 0551 05/01/13 0533 05/02/13 0550  HGB 7.6* 9.4* 8.8*    Recent Labs  05/01/13 0533 05/02/13 0550  WBC 10.3 7.3  RBC 3.17* 2.95*  HCT 26.9* 25.7*  PLT 171 206    Recent Labs  05/01/13 0533 05/02/13 0550  NA 133* 134*  K 3.4* 3.8  CL 96 95*  CO2 30 34*  BUN 11 8  CREATININE 0.39* 0.37*  GLUCOSE 98 108*  CALCIUM 8.5 8.7   No results found for this basename: LABPT, INR,  in the last 72 hours    Assessment/Plan: 3 Days Post-Op Procedure(s) (LRB): COMPRESSION HIP (Left) Hold lovenox x 24 hrs Repeat hg tomorrow  Start iron Toe touch weight bearing  Resume lovenox tomorrow continue until June 23  F/u Dr Hilda Lias in 1 month  Remove staples post op day [address] 05/02/2013, 7:22 AM

## 2013-05-02 NOTE — Clinical Social Work Note (Signed)
Pt d/c today to [facility]. Pt, family, and facility aware and agreeable. Pt to transfer with RN. D/C summary faxed.  Derenda Fennel, [place] [phone]

## 2013-05-06 ENCOUNTER — Encounter (HOSPITAL_COMMUNITY): Payer: Self-pay | Admitting: Orthopaedic Surgery

## 2013-05-06 ENCOUNTER — Other Ambulatory Visit: Payer: Self-pay | Admitting: Geriatric Medicine

## 2013-05-06 MED ORDER — OXYCODONE-ACETAMINOPHEN 5-325 MG PO TABS: 1.0000 | ORAL_TABLET | ORAL | Status: DC | PRN

## 2013-05-07 ENCOUNTER — Non-Acute Institutional Stay (SKILLED_NURSING_FACILITY): Payer: Medicare Other | Admitting: Internal Medicine

## 2013-05-07 DIAGNOSIS — S72002D Fracture of unspecified part of neck of left femur, subsequent encounter for closed fracture with routine healing: Secondary | ICD-10-CM

## 2013-05-07 DIAGNOSIS — S72009D Fracture of unspecified part of neck of unspecified femur, subsequent encounter for closed fracture with routine healing: Secondary | ICD-10-CM | POA: Diagnosis not present

## 2013-05-07 DIAGNOSIS — J441 Chronic obstructive pulmonary disease with (acute) exacerbation: Secondary | ICD-10-CM

## 2013-05-07 DIAGNOSIS — M810 Age-related osteoporosis without current pathological fracture: Secondary | ICD-10-CM

## 2013-05-07 DIAGNOSIS — D62 Acute posthemorrhagic anemia: Secondary | ICD-10-CM | POA: Diagnosis not present

## 2013-05-07 NOTE — Progress Notes (Signed)
Patient ID: Tiffany Maxwell, female   DOB: 11-20-1934, 79 y.o.   MRN: [id number] Chief complaint; admission to SNF for postdate at [facility] for May 19 through May 23 2 2014. [facility]  History; this is a 79 year old woman who lives with her husband in a mobile home outside of [place]. She suffered an accidental fall. She does not describe a history of falling nor does she describe a history of ambulatory assist devices. She does have a history of osteoporosis. She was seen by Dr. Hilda Lias of orthopedics. She underwent left hip surgery on May 21, ORIF. She is in this facility for rehabilitation. She is already anxious to return home.  Past medical history/problem list. #1 admit to the hospital in February 2014 with COPD acute. At that point in time. She had a CT scan of the chest that was normal. Echocardiogram showed an EF of 65-70% right ventricular function appeared normal. Grade 1 diastolic dysfunction. She is on chronic oxygen 2 L at home. CT scan of the chest during this admission did show incidental cholelithiasis. #2 anemia. Hemoglobin was normal in February. She had a lot of postoperative]bleeding, and I think was transfused. She left the hospital with a hemoglobin of 8.8. The next day hemoglobin was 8.3. It has not been checked since. She is on iron.  #3 hypertension. #4 osteoporosis.  Medications; Lovenox 30 mg daily until June 23, lisinopril 40 mg daily, calcium 500/200, one daily, Combivent, 2 puffs when necessary, Lasix 20 when necessary, oxycodone 5/325 when necessary   reports that she quit smoking about 9 months ago. Her smoking use included Cigarettes. She has a 40 pack-year smoking history. She has never used smokeless tobacco. She reports that she does not drink alcohol or use illicit drugs. DNR signed  Review of systems;  Respiratory appears to be at her baseline level of shortness of breath. No cough. Cardiac no chest pain. Abdomen postoperative. Constipation. GU  no dysuria.  Physical exam pulse rate 66, respirations 20, O2 at 2 L.Marland Kitchen HEENT no oral lesions are seen. Respiratory shallower air entry with crackles in the left lower lobe. Prolonged expiratory phase. No wheezing. No clubbing, work of breathing appears normal. Abdomen no liver no spleen no tenderness. GU bladder is not distended. No costovertebral angle tenderness. Musculoskeletal. I could not see her left hip incision. Leg is swollen with discoloration into her calf [old bleeding) pitting edema below the knee. Significant thoracic kyphosis Neurologic basic screen unremarkable. She has antigravity strength. Mental status staff have noticed some confusion. I didn't overtly see this at the bedside, although this will need to be for  Impression/plan #1 left hip fracture in the setting of severe osteoporosis. She does not describe a gait problem nor a fall history. She is not on a biphosphonate and she should be started on this prior to discharge. Postoperative anemia with significant edema of the left leg. I suspect all of this was blood loss rather than thrombosis although the leg. Will need to be monitored. #2 osteoporosis see discussion above. She will need a biphosphonate. #3 COPD; on chronic oxygen likely to be severe. I'm going to put her on routine inhalers, concerned about a prolonged expiratory phase. The absence of wheezing here is a little consolation. #4 hypertension. Will monitor while she is here. #5 on Lovenox. I will recheck her hemoglobin. Substitute xarelto for the Lovenox if things are stable

## 2013-05-30 ENCOUNTER — Ambulatory Visit (HOSPITAL_COMMUNITY)
Admit: 2013-05-30 | Discharge: 2013-05-30 | Disposition: A | Discharge status: Home / Self Care | Payer: Medicare Other | Attending: Internal Medicine | Admitting: Internal Medicine

## 2013-05-30 DIAGNOSIS — S72143A Displaced intertrochanteric fracture of unspecified femur, initial encounter for closed fracture: Secondary | ICD-10-CM | POA: Diagnosis not present

## 2013-05-30 DIAGNOSIS — IMO0002 Reserved for concepts with insufficient information to code with codable children: Secondary | ICD-10-CM | POA: Diagnosis not present

## 2013-05-30 DIAGNOSIS — X58XXXA Exposure to other specified factors, initial encounter: Secondary | ICD-10-CM | POA: Insufficient documentation

## 2013-06-06 ENCOUNTER — Non-Acute Institutional Stay (SKILLED_NURSING_FACILITY): Payer: Medicare Other | Admitting: Internal Medicine

## 2013-06-06 DIAGNOSIS — E871 Hypo-osmolality and hyponatremia: Secondary | ICD-10-CM

## 2013-06-06 DIAGNOSIS — D62 Acute posthemorrhagic anemia: Secondary | ICD-10-CM | POA: Diagnosis not present

## 2013-06-06 DIAGNOSIS — J441 Chronic obstructive pulmonary disease with (acute) exacerbation: Secondary | ICD-10-CM

## 2013-06-06 DIAGNOSIS — S72009D Fracture of unspecified part of neck of unspecified femur, subsequent encounter for closed fracture with routine healing: Secondary | ICD-10-CM | POA: Diagnosis not present

## 2013-06-06 DIAGNOSIS — M810 Age-related osteoporosis without current pathological fracture: Secondary | ICD-10-CM | POA: Diagnosis not present

## 2013-06-06 DIAGNOSIS — S72002D Fracture of unspecified part of neck of left femur, subsequent encounter for closed fracture with routine healing: Secondary | ICD-10-CM

## 2013-06-07 NOTE — Progress Notes (Signed)
Patient ID: Tiffany Maxwell, female   DOB: Aug 30, 1934, 79 y.o.   MRN: [id number] This is a discharge note.  Level of care skilled.  Facility [facility].     Chief complaint;   discharge note  History of present illness- this is a 79 year old woman who lived with her husband in a mobile home outside of [place]. She suffered an accidental fall. She did not describe a history of falling nor does she describe a history of ambulatory assist devices. She does have a history of osteoporosis. She was seen by Dr. Hilda Lias of orthopedics. She underwent left hip surgery on May 21, ORIF. She is in this facility for rehabilitation. She has done well and will be going home she will be with her daughter-. Her stay here has been quite uneventful-she does have history COPD but this has been stable-nursing staff has not reported any significant issues  Her husband also was a resident in this facility but he recently passed away-- .  Past medical history/problem list.  #1 admit to the hospital in February 2014 with COPD acute. At that point in time. She had a CT scan of the chest that was normal. Echocardiogram showed an EF of 65-70% right ventricular function appeared normal. Grade 1 diastolic dysfunction. She is on chronic oxygen 2 L at home. CT scan of the chest during this admission did show incidental cholelithiasis.  #2 anemia. Hemoglobin was normal in February. She had a lot of postoperative]bleeding, and I think was transfused. She left the hospital with a hemoglobin of 8.8. The next day hemoglobin was 8.3. . She is on iron. Hemoglobin has risen to 10.7 her most recent lab #3 hypertension.  #4 osteoporosis.  Medications; , lisinopril 40 mg daily, calcium 500/200, one daily, Combivent, 2 puffs when necessary, Lasix 20 when necessary, oxycodone 5/325 when necessary --she does take this one-2 times a day reports that she quit smoking about 9 months ago. Her smoking use included Cigarettes. She has a 40 pack-year  smoking history. She has never used smokeless tobacco. She reports that she does not drink alcohol or use illicit drugs. DNR signed   Review of systems; General denies any fever or chills.  Head ears eyes nose mouth and throat does not complaining of any visual changes sore throat  Respiratory appears to be at her baseline level of shortness of breath. No cough.  Cardiac no chest pain.  Abdomen postoperative. Constipation- this appears to have resolved.  GU no dysuria. Muscle skeletal-has occasional hip pain states she does not really like to take pain medicine if not necessary.  Neurologic-does not complaining of any headaches syncope or numbness   Physical exam --temperature 90.5 pulse 70 respirations 20 blood pressure 140/69-159/81-138/80-126/75 --weight is 131.8-she has lost about 10 pounds the past month and has been started on dietary supplements  In general-this is a pleasant elderly female in no distress she appears to be in good spirits.  Her skin is warm and dry.  L..  HEENT no oral lesions are seen. --Sclera and conjunctiva are clear to pupils appear reactive to light visual acuity appears intact Respiratory-clear to auscultation t no rhonchi rales or wheezes-no labored breathing somewhat prolonged expiratory phase l.  Abdomen no liver no spleen no tenderness. Positive bowel sounds .  Musculoskeletal.  able to transfer without assistance-left hip has a well healed surgical scar I did not note any deformities There is no significant lower extremity edema-pedal pulses are intact bilaterally  Neurologic basic screen unremarkable. She  has antigravity strength. No focal to do  Mental status  Appears alert and oriented pleasant and appropriate  Labs.  05/08/2013.  WBC 7.7 hemoglobin 10.7 platelets 394.  Sodium 132 potassium 4.1 BUN 5 creatinine 0.36.  Liver function tests within normal limits except albumin of 2.5.  Assessment and plan   #1 left hip fracture in the  setting of osteoporosis. --She appears to have done relatively well-will need continued PT and OT.  She does continue on calcium --would consider starting her on a biphosphonate I would be hesitant to do that the day before discharge  however- we will defer to primary care provider .  #2 osteoporosis see discussion above. suspect will need a biphosphonate.  #3 COPD; on chronic oxygen --This appears to be somewhat improved.  #4 hypertension. Relatively stable occasional some systolics are elevated but I do not see consistency she is on lisinopril  #5 anticoagulation-currently is not on any initially she was on Lovenox-Will start low-dose aspirin with followup by primary care provider.  #6-weight loss-she has been started on supplements here-she says she really doesn't like the food too much  which may be contributing to her weight loss--suspect going home may be beneficial to her weight issue  #7-anemia-this appears to be improving she is on iron Will update lab before discharge.  #8-hyponatremia-apparently this is somewhat chronic again we'll check metabolic panel before discharge  . [id number] note greater than 30 minutes spent preparing this discharge summary             Discharge

## 2013-06-10 ENCOUNTER — Encounter: Payer: Self-pay | Admitting: Nurse Practitioner

## 2013-06-10 ENCOUNTER — Ambulatory Visit (INDEPENDENT_AMBULATORY_CARE_PROVIDER_SITE_OTHER): Payer: Medicare Other | Admitting: Nurse Practitioner

## 2013-06-10 VITALS — BP 124/76 | HR 80 | Temp 97.0°F

## 2013-06-10 DIAGNOSIS — K5900 Constipation, unspecified: Secondary | ICD-10-CM

## 2013-06-10 DIAGNOSIS — J441 Chronic obstructive pulmonary disease with (acute) exacerbation: Secondary | ICD-10-CM

## 2013-06-10 DIAGNOSIS — I10 Essential (primary) hypertension: Secondary | ICD-10-CM | POA: Diagnosis not present

## 2013-06-10 DIAGNOSIS — D649 Anemia, unspecified: Secondary | ICD-10-CM

## 2013-06-10 DIAGNOSIS — R609 Edema, unspecified: Secondary | ICD-10-CM | POA: Diagnosis not present

## 2013-06-10 DIAGNOSIS — R600 Localized edema: Secondary | ICD-10-CM

## 2013-06-10 MED ORDER — FERROUS SULFATE 325 (65 FE) MG PO TABS: 325.0000 mg | ORAL_TABLET | Freq: Every day | ORAL | Status: DC

## 2013-06-10 MED ORDER — LISINOPRIL 40 MG PO TABS: 40.0000 mg | ORAL_TABLET | Freq: Every day | ORAL | Status: DC

## 2013-06-10 MED ORDER — POLYETHYLENE GLYCOL 3350 17 G PO PACK: 17.0000 g | PACK | Freq: Every day | ORAL | Status: DC

## 2013-06-10 MED ORDER — FUROSEMIDE 20 MG PO TABS: 20.0000 mg | ORAL_TABLET | Freq: Every day | ORAL | Status: DC | PRN

## 2013-06-10 MED ORDER — IPRATROPIUM-ALBUTEROL 18-103 MCG/ACT IN AERO: 2.0000 | INHALATION_SPRAY | Freq: Four times a day (QID) | RESPIRATORY_TRACT | Status: DC | PRN

## 2013-06-10 NOTE — Progress Notes (Addendum)
Subjective:    Patient ID: Tiffany Maxwell, female    DOB: 10-09-1934, 79 y.o.   MRN: [id number]  HPI Pt here for a hospital follow-up. Pt states she fell and broke her left hip late May. She was admitted then had rehab at the [facility]. She was discharged from the [facility] yesterday.  Pt has no complaints and states she is doing well.  * Husband died two weeks ago   Review of Systems  Musculoskeletal: Positive for back pain and gait problem.       HX of left hip fracture   All other systems reviewed and are negative.       Objective:   Physical Exam  Constitutional: She is oriented to person, place, and time. She appears well-developed and well-nourished.  HENT:  Head: Normocephalic.  Neck: Normal range of motion. Neck supple.  Cardiovascular: Normal rate, regular rhythm, normal heart sounds and intact distal pulses.   Pulmonary/Chest: Effort normal.  Diminished breath sounds bilaterally  O2 via cannulla   Abdominal: Soft. Bowel sounds are normal.  Musculoskeletal: She exhibits edema (trace amt of swelling bilateral LE) and tenderness (in left hip).  Neurological: She is alert and oriented to person, place, and time.  Skin: Skin is warm and dry.  Psychiatric: She has a normal mood and affect. Her behavior is normal. Judgment and thought content normal.     BP 124/76  Pulse 80  Temp(Src) 97 F (36.1 C) (Oral)      Assessment & Plan:   1. Hypertension   2. Peripheral edema   3. Constipation   4. Anemia   5. COPD exacerbation    No orders of the defined types were placed in this encounter.   Meds ordered this encounter  Medications  . aspirin EC 81 MG tablet    Sig: Take 81 mg by mouth daily.  Marland Kitchen DISCONTD: polyethylene glycol (MIRALAX / GLYCOLAX) packet    Sig: Take 17 g by mouth daily.  Marland Kitchen albuterol-ipratropium (COMBIVENT) 18-103 MCG/ACT inhaler    Sig: Inhale 2 puffs into the lungs every 6 (six) hours as needed for wheezing.    Dispense:  1 Inhaler   Refill:  5    Order Specific Question:  Supervising Provider    Answer:  Ernestina Penna [[id number]]  . ferrous sulfate (FERROUSUL) 325 (65 FE) MG tablet    Sig: Take 1 tablet (325 mg total) by mouth daily with breakfast.    Dispense:  30 tablet    Refill:  5    Order Specific Question:  Supervising Provider    Answer:  Ernestina Penna [[id number]]  . furosemide (LASIX) 20 MG tablet    Sig: Take 1 tablet (20 mg total) by mouth daily as needed (Fluid).    Dispense:  30 tablet    Refill:  5    Order Specific Question:  Supervising Provider    Answer:  Ernestina Penna [[id number]]  . lisinopril (PRINIVIL,ZESTRIL) 40 MG tablet    Sig: Take 1 tablet (40 mg total) by mouth daily.    Dispense:  30 tablet    Refill:  5    Order Specific Question:  Supervising Provider    Answer:  Ernestina Penna [[id number]]  . polyethylene glycol (MIRALAX / GLYCOLAX) packet    Sig: Take 17 g by mouth daily.    Dispense:  30 packet    Refill:  5    Order Specific Question:  Supervising Provider  Answer:  Ernestina Penna [[id number]]   Continue all meds  Labs not done today Fall precautions discussed Follow up in 1 month  Tiffany Daphine Deutscher, FNP

## 2013-06-10 NOTE — Patient Instructions (Addendum)
Hip Fracture (Upper Femoral Fracture) You have a hip fracture (break in bone). This is a fracture of the upper part of the big bone (femur, thigh bone) between your hip and knee. If your caregiver feels it is a stable fracture, occasionally it can be treated without surgery. Usually these fractures are unstable. This means that the bones will not heal properly without surgery. Surgery is necessary to hold the bones together in a good position where they will heal well. DIAGNOSIS A physical exam can determine if a fracture has occurred. X-ray studies are needed to see what type of fracture is present and to look for other injuries. These studies will help your caregiver determine what the best treatment is for you. If there is more than one option, your caregiver can give you the information needed to help you decide on the treatment. TREATMENT  The treatment for an unstable fracture is usually surgery. This means using a screw, nail, or rod to hold the bones in place.  RISKS AND COMPLICATIONS All surgery is associated with risks. Sometimes the implant may fail. Other complications of surgery include infection or the bones not healing properly. Sometimes the fracture may damage the blood supply to the head of the femur. That portion of bone may die (osteonecrosis or avascular necrosis). Sometimes to avoid this complication, an implant is used which just replaces the ball of the femur (hemi-arthroplasty or prosthetic replacement). Some of the other risks are:  Excessive bleeding.  Infection.  Dislocation if a hemi-arthroplasty or a total hip was inserted.  Failure to heal properly resulting in an unstable hip.  Stiffness of hip following repair.  On occasion, blood may have to be replaced before or during the procedure LET YOUR CAREGIVERS KNOW ABOUT:  Allergies.  Medications taken including herbs, eye drops, over the counter medications, and creams.  Use of steroids (by mouth or  creams).  History of bleeding or blood problems.  History of serious infection.  Previous problems with anesthetics or novocaine.  Possibility of pregnancy, if this applies.  History of blood clots (thrombophlebitis).  Previous surgery.  Other health problems. BEFORE THE PROCEDURE Before surgery, an IV (intravenous line connected to your vein) may be started. You will be given an anesthetic (medications and gas to make you sleep) or given medications in your back to make you numb from the waist down (spinal anesthetic). AFTER THE PROCEDURE After surgery, you will be taken to the recovery area where a nurse will watch your progress. You may have a catheter (a long, narrow, hollow tube) in your bladder that helps you pass your water. Once you're awake, stable, and taking fluids well, you will be returned to your room. You will receive physical therapy and other care until you are doing well and your caregiver feels it is safe for you to be transferred either to home or to an extended care facility. Your activity level will change as your caregiver determines what is best for you.  You may resume normal diet and activities as directed or allowed.  Change dressings if necessary or as directed.  Only take over-the-counter or prescription medicines for pain, discomfort, or fever as directed by your caregiver.  You may be placed on blood thinners for 4-6 weeks to prevent blood clots. SEEK IMMEDIATE MEDICAL CARE IF:  There is swelling of your calf or leg.  You have shortness of breath or chest pain.  There is redness, swelling, or increasing pain in the wound.  There is  pus coming from wound.  You have an unexplained oral temperature above 102 F (38.9 C).  There is a foul (bad) smell coming from the wound or dressing.  There is a breaking open of the wound (edges not staying together) after sutures or staples have been removed.  There is a marked increase in pain or shortening of  the leg.  You have severe pain anywhere in the leg.  There is any change in color or temperature of your leg below the injury. MAKE SURE YOU:   Understand these instructions.  Will watch your condition.  Will get help right away if you are not doing well or get worse. Document Released: 11/27/2005 Document Revised: 02/19/2012 Document Reviewed: 07/17/2008 [facility] Patient Information 2014 [place], [place]. Fall Prevention and Home Safety Falls cause injuries and can affect all age groups. It is possible to use preventive measures to significantly decrease the likelihood of falls. There are many simple measures which can make your home safer and prevent falls. OUTDOORS  Repair cracks and edges of walkways and driveways.  Remove high doorway thresholds.  Trim shrubbery on the main path into your home.  Have good outside lighting.  Clear walkways of tools, rocks, debris, and clutter.  Check that handrails are not broken and are securely fastened. Both sides of steps should have handrails.  Have leaves, snow, and ice cleared regularly.  Use sand or salt on walkways during winter months.  In the garage, clean up grease or oil spills. BATHROOM  Install night lights.  Install grab bars by the toilet and in the tub and shower.  Use non-skid mats or decals in the tub or shower.  Place a plastic non-slip stool in the shower to sit on, if needed.  Keep floors dry and clean up all water on the floor immediately.  Remove soap buildup in the tub or shower on a regular basis.  Secure bath mats with non-slip, double-sided rug tape.  Remove throw rugs and tripping hazards from the floors. BEDROOMS  Install night lights.  Make sure a bedside light is easy to reach.  Do not use oversized bedding.  Keep a telephone by your bedside.  Have a firm chair with side arms to use for getting dressed.  Remove throw rugs and tripping hazards from the floor. KITCHEN  Keep handles  on pots and pans turned toward the center of the stove. Use back burners when possible.  Clean up spills quickly and allow time for drying.  Avoid walking on wet floors.  Avoid hot utensils and knives.  Position shelves so they are not too high or low.  Place commonly used objects within easy reach.  If necessary, use a sturdy step stool with a grab bar when reaching.  Keep electrical cables out of the way.  Do not use floor polish or wax that makes floors slippery. If you must use wax, use non-skid floor wax.  Remove throw rugs and tripping hazards from the floor. STAIRWAYS  Never leave objects on stairs.  Place handrails on both sides of stairways and use them. Fix any loose handrails. Make sure handrails on both sides of the stairways are as long as the stairs.  Check carpeting to make sure it is firmly attached along stairs. Make repairs to worn or loose carpet promptly.  Avoid placing throw rugs at the top or bottom of stairways, or properly secure the rug with carpet tape to prevent slippage. Get rid of throw rugs, if possible.  Have  an electrician put in a light switch at the top and bottom of the stairs. OTHER FALL PREVENTION TIPS  Wear low-heel or rubber-soled shoes that are supportive and fit well. Wear closed toe shoes.  When using a stepladder, make sure it is fully opened and both spreaders are firmly locked. Do not climb a closed stepladder.  Add color or contrast paint or tape to grab bars and handrails in your home. Place contrasting color strips on first and last steps.  Learn and use mobility aids as needed. Install an electrical emergency response system.  Turn on lights to avoid dark areas. Replace light bulbs that burn out immediately. Get light switches that glow.  Arrange furniture to create clear pathways. Keep furniture in the same place.  Firmly attach carpet with non-skid or double-sided tape.  Eliminate uneven floor surfaces.  Select a  carpet pattern that does not visually hide the edge of steps.  Be aware of all pets. OTHER HOME SAFETY TIPS  Set the water temperature for 120 F (48.8 C).  Keep emergency numbers on or near the telephone.  Keep smoke detectors on every level of the home and near sleeping areas. Document Released: 11/17/2002 Document Revised: 05/28/2012 Document Reviewed: 02/16/2012 [facility] Patient Information 2014 [place], [place].

## 2013-06-11 DIAGNOSIS — S72009D Fracture of unspecified part of neck of unspecified femur, subsequent encounter for closed fracture with routine healing: Secondary | ICD-10-CM | POA: Diagnosis not present

## 2013-06-11 DIAGNOSIS — J449 Chronic obstructive pulmonary disease, unspecified: Secondary | ICD-10-CM | POA: Diagnosis not present

## 2013-06-11 DIAGNOSIS — Z9981 Dependence on supplemental oxygen: Secondary | ICD-10-CM | POA: Diagnosis not present

## 2013-06-11 DIAGNOSIS — IMO0001 Reserved for inherently not codable concepts without codable children: Secondary | ICD-10-CM | POA: Diagnosis not present

## 2013-06-11 DIAGNOSIS — M810 Age-related osteoporosis without current pathological fracture: Secondary | ICD-10-CM | POA: Diagnosis not present

## 2013-06-11 DIAGNOSIS — I10 Essential (primary) hypertension: Secondary | ICD-10-CM | POA: Diagnosis not present

## 2013-06-16 DIAGNOSIS — J449 Chronic obstructive pulmonary disease, unspecified: Secondary | ICD-10-CM | POA: Diagnosis not present

## 2013-06-16 DIAGNOSIS — S72009D Fracture of unspecified part of neck of unspecified femur, subsequent encounter for closed fracture with routine healing: Secondary | ICD-10-CM | POA: Diagnosis not present

## 2013-06-16 DIAGNOSIS — Z9981 Dependence on supplemental oxygen: Secondary | ICD-10-CM | POA: Diagnosis not present

## 2013-06-16 DIAGNOSIS — I10 Essential (primary) hypertension: Secondary | ICD-10-CM | POA: Diagnosis not present

## 2013-06-16 DIAGNOSIS — M810 Age-related osteoporosis without current pathological fracture: Secondary | ICD-10-CM | POA: Diagnosis not present

## 2013-06-16 DIAGNOSIS — IMO0001 Reserved for inherently not codable concepts without codable children: Secondary | ICD-10-CM | POA: Diagnosis not present

## 2013-06-18 DIAGNOSIS — Z9981 Dependence on supplemental oxygen: Secondary | ICD-10-CM | POA: Diagnosis not present

## 2013-06-18 DIAGNOSIS — M810 Age-related osteoporosis without current pathological fracture: Secondary | ICD-10-CM | POA: Diagnosis not present

## 2013-06-18 DIAGNOSIS — I10 Essential (primary) hypertension: Secondary | ICD-10-CM | POA: Diagnosis not present

## 2013-06-18 DIAGNOSIS — S72009D Fracture of unspecified part of neck of unspecified femur, subsequent encounter for closed fracture with routine healing: Secondary | ICD-10-CM | POA: Diagnosis not present

## 2013-06-18 DIAGNOSIS — IMO0001 Reserved for inherently not codable concepts without codable children: Secondary | ICD-10-CM | POA: Diagnosis not present

## 2013-06-18 DIAGNOSIS — J449 Chronic obstructive pulmonary disease, unspecified: Secondary | ICD-10-CM | POA: Diagnosis not present

## 2013-06-19 ENCOUNTER — Telehealth: Payer: Self-pay | Admitting: Nurse Practitioner

## 2013-06-19 ENCOUNTER — Other Ambulatory Visit: Payer: Self-pay | Admitting: Family Medicine

## 2013-06-20 DIAGNOSIS — M810 Age-related osteoporosis without current pathological fracture: Secondary | ICD-10-CM | POA: Diagnosis not present

## 2013-06-20 DIAGNOSIS — S72009D Fracture of unspecified part of neck of unspecified femur, subsequent encounter for closed fracture with routine healing: Secondary | ICD-10-CM | POA: Diagnosis not present

## 2013-06-20 DIAGNOSIS — IMO0001 Reserved for inherently not codable concepts without codable children: Secondary | ICD-10-CM | POA: Diagnosis not present

## 2013-06-20 DIAGNOSIS — J449 Chronic obstructive pulmonary disease, unspecified: Secondary | ICD-10-CM | POA: Diagnosis not present

## 2013-06-20 DIAGNOSIS — Z9981 Dependence on supplemental oxygen: Secondary | ICD-10-CM | POA: Diagnosis not present

## 2013-06-20 DIAGNOSIS — I10 Essential (primary) hypertension: Secondary | ICD-10-CM | POA: Diagnosis not present

## 2013-06-20 NOTE — Telephone Encounter (Signed)
Gave to [employer], who has been working on this

## 2013-06-24 ENCOUNTER — Telehealth: Payer: Self-pay | Admitting: *Deleted

## 2013-06-24 DIAGNOSIS — J449 Chronic obstructive pulmonary disease, unspecified: Secondary | ICD-10-CM | POA: Diagnosis not present

## 2013-06-24 DIAGNOSIS — IMO0001 Reserved for inherently not codable concepts without codable children: Secondary | ICD-10-CM | POA: Diagnosis not present

## 2013-06-24 DIAGNOSIS — Z9181 History of falling: Secondary | ICD-10-CM

## 2013-06-24 DIAGNOSIS — I10 Essential (primary) hypertension: Secondary | ICD-10-CM | POA: Diagnosis not present

## 2013-06-24 DIAGNOSIS — M810 Age-related osteoporosis without current pathological fracture: Secondary | ICD-10-CM | POA: Diagnosis not present

## 2013-06-24 DIAGNOSIS — Z9981 Dependence on supplemental oxygen: Secondary | ICD-10-CM | POA: Diagnosis not present

## 2013-06-24 DIAGNOSIS — S72009D Fracture of unspecified part of neck of unspecified femur, subsequent encounter for closed fracture with routine healing: Secondary | ICD-10-CM | POA: Diagnosis not present

## 2013-06-24 NOTE — Telephone Encounter (Signed)
Referral order done

## 2013-06-25 DIAGNOSIS — Z9981 Dependence on supplemental oxygen: Secondary | ICD-10-CM | POA: Diagnosis not present

## 2013-06-25 DIAGNOSIS — M810 Age-related osteoporosis without current pathological fracture: Secondary | ICD-10-CM | POA: Diagnosis not present

## 2013-06-25 DIAGNOSIS — I10 Essential (primary) hypertension: Secondary | ICD-10-CM | POA: Diagnosis not present

## 2013-06-25 DIAGNOSIS — J449 Chronic obstructive pulmonary disease, unspecified: Secondary | ICD-10-CM | POA: Diagnosis not present

## 2013-06-25 DIAGNOSIS — S72009D Fracture of unspecified part of neck of unspecified femur, subsequent encounter for closed fracture with routine healing: Secondary | ICD-10-CM | POA: Diagnosis not present

## 2013-06-25 DIAGNOSIS — IMO0001 Reserved for inherently not codable concepts without codable children: Secondary | ICD-10-CM | POA: Diagnosis not present

## 2013-06-27 DIAGNOSIS — IMO0001 Reserved for inherently not codable concepts without codable children: Secondary | ICD-10-CM | POA: Diagnosis not present

## 2013-06-27 DIAGNOSIS — Z9981 Dependence on supplemental oxygen: Secondary | ICD-10-CM | POA: Diagnosis not present

## 2013-06-27 DIAGNOSIS — I10 Essential (primary) hypertension: Secondary | ICD-10-CM | POA: Diagnosis not present

## 2013-06-27 DIAGNOSIS — J449 Chronic obstructive pulmonary disease, unspecified: Secondary | ICD-10-CM | POA: Diagnosis not present

## 2013-06-27 DIAGNOSIS — S72009D Fracture of unspecified part of neck of unspecified femur, subsequent encounter for closed fracture with routine healing: Secondary | ICD-10-CM | POA: Diagnosis not present

## 2013-06-27 DIAGNOSIS — M810 Age-related osteoporosis without current pathological fracture: Secondary | ICD-10-CM | POA: Diagnosis not present

## 2013-06-30 DIAGNOSIS — M810 Age-related osteoporosis without current pathological fracture: Secondary | ICD-10-CM | POA: Diagnosis not present

## 2013-06-30 DIAGNOSIS — Z9981 Dependence on supplemental oxygen: Secondary | ICD-10-CM | POA: Diagnosis not present

## 2013-06-30 DIAGNOSIS — S72009D Fracture of unspecified part of neck of unspecified femur, subsequent encounter for closed fracture with routine healing: Secondary | ICD-10-CM | POA: Diagnosis not present

## 2013-06-30 DIAGNOSIS — J449 Chronic obstructive pulmonary disease, unspecified: Secondary | ICD-10-CM | POA: Diagnosis not present

## 2013-06-30 DIAGNOSIS — IMO0001 Reserved for inherently not codable concepts without codable children: Secondary | ICD-10-CM | POA: Diagnosis not present

## 2013-06-30 DIAGNOSIS — I10 Essential (primary) hypertension: Secondary | ICD-10-CM | POA: Diagnosis not present

## 2013-07-02 DIAGNOSIS — J449 Chronic obstructive pulmonary disease, unspecified: Secondary | ICD-10-CM | POA: Diagnosis not present

## 2013-07-02 DIAGNOSIS — S72009D Fracture of unspecified part of neck of unspecified femur, subsequent encounter for closed fracture with routine healing: Secondary | ICD-10-CM | POA: Diagnosis not present

## 2013-07-02 DIAGNOSIS — Z9981 Dependence on supplemental oxygen: Secondary | ICD-10-CM | POA: Diagnosis not present

## 2013-07-02 DIAGNOSIS — M810 Age-related osteoporosis without current pathological fracture: Secondary | ICD-10-CM | POA: Diagnosis not present

## 2013-07-02 DIAGNOSIS — I10 Essential (primary) hypertension: Secondary | ICD-10-CM | POA: Diagnosis not present

## 2013-07-02 DIAGNOSIS — M25559 Pain in unspecified hip: Secondary | ICD-10-CM | POA: Diagnosis not present

## 2013-07-02 DIAGNOSIS — IMO0001 Reserved for inherently not codable concepts without codable children: Secondary | ICD-10-CM | POA: Diagnosis not present

## 2013-07-07 DIAGNOSIS — J449 Chronic obstructive pulmonary disease, unspecified: Secondary | ICD-10-CM | POA: Diagnosis not present

## 2013-07-07 DIAGNOSIS — S72009D Fracture of unspecified part of neck of unspecified femur, subsequent encounter for closed fracture with routine healing: Secondary | ICD-10-CM | POA: Diagnosis not present

## 2013-07-07 DIAGNOSIS — IMO0001 Reserved for inherently not codable concepts without codable children: Secondary | ICD-10-CM | POA: Diagnosis not present

## 2013-07-07 DIAGNOSIS — I10 Essential (primary) hypertension: Secondary | ICD-10-CM | POA: Diagnosis not present

## 2013-07-07 DIAGNOSIS — Z9981 Dependence on supplemental oxygen: Secondary | ICD-10-CM | POA: Diagnosis not present

## 2013-07-07 DIAGNOSIS — M810 Age-related osteoporosis without current pathological fracture: Secondary | ICD-10-CM | POA: Diagnosis not present

## 2013-07-08 DIAGNOSIS — Z9981 Dependence on supplemental oxygen: Secondary | ICD-10-CM | POA: Diagnosis not present

## 2013-07-08 DIAGNOSIS — J449 Chronic obstructive pulmonary disease, unspecified: Secondary | ICD-10-CM | POA: Diagnosis not present

## 2013-07-08 DIAGNOSIS — S72009D Fracture of unspecified part of neck of unspecified femur, subsequent encounter for closed fracture with routine healing: Secondary | ICD-10-CM | POA: Diagnosis not present

## 2013-07-08 DIAGNOSIS — IMO0001 Reserved for inherently not codable concepts without codable children: Secondary | ICD-10-CM | POA: Diagnosis not present

## 2013-07-08 DIAGNOSIS — I10 Essential (primary) hypertension: Secondary | ICD-10-CM | POA: Diagnosis not present

## 2013-07-08 DIAGNOSIS — M810 Age-related osteoporosis without current pathological fracture: Secondary | ICD-10-CM | POA: Diagnosis not present

## 2013-07-10 DIAGNOSIS — Z9981 Dependence on supplemental oxygen: Secondary | ICD-10-CM | POA: Diagnosis not present

## 2013-07-10 DIAGNOSIS — M810 Age-related osteoporosis without current pathological fracture: Secondary | ICD-10-CM | POA: Diagnosis not present

## 2013-07-10 DIAGNOSIS — S72009D Fracture of unspecified part of neck of unspecified femur, subsequent encounter for closed fracture with routine healing: Secondary | ICD-10-CM | POA: Diagnosis not present

## 2013-07-10 DIAGNOSIS — IMO0001 Reserved for inherently not codable concepts without codable children: Secondary | ICD-10-CM

## 2013-07-10 DIAGNOSIS — J449 Chronic obstructive pulmonary disease, unspecified: Secondary | ICD-10-CM | POA: Diagnosis not present

## 2013-07-10 DIAGNOSIS — I10 Essential (primary) hypertension: Secondary | ICD-10-CM | POA: Diagnosis not present

## 2013-07-14 DIAGNOSIS — J449 Chronic obstructive pulmonary disease, unspecified: Secondary | ICD-10-CM | POA: Diagnosis not present

## 2013-07-14 DIAGNOSIS — Z9981 Dependence on supplemental oxygen: Secondary | ICD-10-CM | POA: Diagnosis not present

## 2013-07-14 DIAGNOSIS — S72009D Fracture of unspecified part of neck of unspecified femur, subsequent encounter for closed fracture with routine healing: Secondary | ICD-10-CM | POA: Diagnosis not present

## 2013-07-14 DIAGNOSIS — IMO0001 Reserved for inherently not codable concepts without codable children: Secondary | ICD-10-CM | POA: Diagnosis not present

## 2013-07-14 DIAGNOSIS — M810 Age-related osteoporosis without current pathological fracture: Secondary | ICD-10-CM | POA: Diagnosis not present

## 2013-07-14 DIAGNOSIS — I10 Essential (primary) hypertension: Secondary | ICD-10-CM | POA: Diagnosis not present

## 2013-07-16 DIAGNOSIS — IMO0001 Reserved for inherently not codable concepts without codable children: Secondary | ICD-10-CM | POA: Diagnosis not present

## 2013-07-16 DIAGNOSIS — J449 Chronic obstructive pulmonary disease, unspecified: Secondary | ICD-10-CM | POA: Diagnosis not present

## 2013-07-16 DIAGNOSIS — M810 Age-related osteoporosis without current pathological fracture: Secondary | ICD-10-CM | POA: Diagnosis not present

## 2013-07-16 DIAGNOSIS — Z9981 Dependence on supplemental oxygen: Secondary | ICD-10-CM | POA: Diagnosis not present

## 2013-07-16 DIAGNOSIS — I10 Essential (primary) hypertension: Secondary | ICD-10-CM | POA: Diagnosis not present

## 2013-07-16 DIAGNOSIS — S72009D Fracture of unspecified part of neck of unspecified femur, subsequent encounter for closed fracture with routine healing: Secondary | ICD-10-CM | POA: Diagnosis not present

## 2013-07-24 DIAGNOSIS — Z9981 Dependence on supplemental oxygen: Secondary | ICD-10-CM | POA: Diagnosis not present

## 2013-07-24 DIAGNOSIS — J449 Chronic obstructive pulmonary disease, unspecified: Secondary | ICD-10-CM | POA: Diagnosis not present

## 2013-07-24 DIAGNOSIS — M810 Age-related osteoporosis without current pathological fracture: Secondary | ICD-10-CM | POA: Diagnosis not present

## 2013-07-24 DIAGNOSIS — I10 Essential (primary) hypertension: Secondary | ICD-10-CM | POA: Diagnosis not present

## 2013-07-24 DIAGNOSIS — IMO0001 Reserved for inherently not codable concepts without codable children: Secondary | ICD-10-CM | POA: Diagnosis not present

## 2013-07-24 DIAGNOSIS — S72009D Fracture of unspecified part of neck of unspecified femur, subsequent encounter for closed fracture with routine healing: Secondary | ICD-10-CM | POA: Diagnosis not present

## 2013-09-29 DIAGNOSIS — S72143A Displaced intertrochanteric fracture of unspecified femur, initial encounter for closed fracture: Secondary | ICD-10-CM | POA: Diagnosis not present

## 2013-09-29 DIAGNOSIS — I10 Essential (primary) hypertension: Secondary | ICD-10-CM | POA: Diagnosis not present

## 2013-10-13 ENCOUNTER — Ambulatory Visit (INDEPENDENT_AMBULATORY_CARE_PROVIDER_SITE_OTHER): Payer: Medicare Other | Admitting: Nurse Practitioner

## 2013-10-13 ENCOUNTER — Encounter: Payer: Self-pay | Admitting: Nurse Practitioner

## 2013-10-13 VITALS — BP 159/81 | HR 65 | Temp 97.1°F | Wt 125.0 lb

## 2013-10-13 DIAGNOSIS — I10 Essential (primary) hypertension: Secondary | ICD-10-CM

## 2013-10-13 DIAGNOSIS — E538 Deficiency of other specified B group vitamins: Secondary | ICD-10-CM | POA: Diagnosis not present

## 2013-10-13 DIAGNOSIS — R609 Edema, unspecified: Secondary | ICD-10-CM | POA: Diagnosis not present

## 2013-10-13 DIAGNOSIS — M810 Age-related osteoporosis without current pathological fracture: Secondary | ICD-10-CM

## 2013-10-13 DIAGNOSIS — D62 Acute posthemorrhagic anemia: Secondary | ICD-10-CM

## 2013-10-13 DIAGNOSIS — D649 Anemia, unspecified: Secondary | ICD-10-CM

## 2013-10-13 DIAGNOSIS — E876 Hypokalemia: Secondary | ICD-10-CM

## 2013-10-13 DIAGNOSIS — R0902 Hypoxemia: Secondary | ICD-10-CM

## 2013-10-13 DIAGNOSIS — J438 Other emphysema: Secondary | ICD-10-CM

## 2013-10-13 DIAGNOSIS — J439 Emphysema, unspecified: Secondary | ICD-10-CM

## 2013-10-13 DIAGNOSIS — J209 Acute bronchitis, unspecified: Secondary | ICD-10-CM

## 2013-10-13 DIAGNOSIS — J441 Chronic obstructive pulmonary disease with (acute) exacerbation: Secondary | ICD-10-CM

## 2013-10-13 MED ORDER — ALPRAZOLAM 0.25 MG PO TABS: 0.2500 mg | ORAL_TABLET | Freq: Every evening | ORAL | Status: DC | PRN

## 2013-10-13 MED ORDER — CITALOPRAM HYDROBROMIDE 20 MG PO TABS: 20.0000 mg | ORAL_TABLET | Freq: Every day | ORAL | Status: DC

## 2013-10-13 MED ORDER — LISINOPRIL 40 MG PO TABS: 40.0000 mg | ORAL_TABLET | Freq: Every day | ORAL | Status: DC

## 2013-10-13 MED ORDER — FUROSEMIDE 20 MG PO TABS: 20.0000 mg | ORAL_TABLET | Freq: Every day | ORAL | Status: DC | PRN

## 2013-10-13 MED ORDER — FERROUS SULFATE 325 (65 FE) MG PO TABS: 325.0000 mg | ORAL_TABLET | Freq: Every day | ORAL | Status: DC

## 2013-10-13 MED ORDER — IPRATROPIUM-ALBUTEROL 18-103 MCG/ACT IN AERO: 2.0000 | INHALATION_SPRAY | Freq: Four times a day (QID) | RESPIRATORY_TRACT | Status: DC | PRN

## 2013-10-13 NOTE — Patient Instructions (Signed)

## 2013-10-13 NOTE — Progress Notes (Signed)
Subjective:    Patient ID: Tiffany Maxwell, female    DOB: 08-Apr-1934, 79 y.o.   MRN: [id number]  HPI Patient here today for follow up- Her daughter in law with her today and says that she doesn't sleep well at night and that she mopes around a lot and is just sad all the time. Other wise she is doing fine.  Patient Active Problem List   Diagnosis Date Noted  . Acute blood loss anemia 04/30/2013  . Hip fracture 04/28/2013  . Leukocytosis 04/28/2013  . Multiple fractures of thoracic spine, closed 01/30/2013  . Cholelithiasis 01/30/2013  . Pulmonary emphysema 01/30/2013  . Acute bronchitis 01/30/2013  . History of tobacco use 01/30/2013  . Diastolic dysfunction 01/30/2013  . Hypokalemia 01/29/2013  . Osteoporosis, unspecified 01/29/2013  . COPD exacerbation 01/28/2013  . Compression fracture 01/28/2013  . Hypoxemia 01/28/2013  . HTN (hypertension) 01/28/2013  . Rash 01/28/2013   Outpatient Encounter Prescriptions as of 10/13/2013  Medication Sig  . albuterol-ipratropium (COMBIVENT) 18-103 MCG/ACT inhaler Inhale 2 puffs into the lungs every 6 (six) hours as needed for wheezing.  Marland Kitchen aspirin EC 81 MG tablet Take 81 mg by mouth daily.  . Calcium Carbonate-Vitamin D (CALCIUM-VITAMIN D) 500-200 MG-UNIT per tablet Take 1 tablet by mouth daily.  . ferrous sulfate (FERROUSUL) 325 (65 FE) MG tablet Take 1 tablet (325 mg total) by mouth daily with breakfast.  . furosemide (LASIX) 20 MG tablet Take 1 tablet (20 mg total) by mouth daily as needed (Fluid).  Marland Kitchen lisinopril (PRINIVIL,ZESTRIL) 40 MG tablet Take 1 tablet (40 mg total) by mouth daily.  Marland Kitchen oxyCODONE-acetaminophen (PERCOCET/ROXICET) 5-325 MG per tablet Take 1-2 tablets by mouth every 4 (four) hours as needed.  . polyethylene glycol (MIRALAX / GLYCOLAX) packet Take 17 g by mouth daily.       Review of Systems     Objective:   Physical Exam  Constitutional: She is oriented to person, place, and time. She appears well-developed and  well-nourished.  HENT:  Nose: Nose normal.  Mouth/Throat: Oropharynx is clear and moist.  Eyes: EOM are normal.  Neck: Trachea normal, normal range of motion and full passive range of motion without pain. Neck supple. No JVD present. Carotid bruit is not present. No thyromegaly present.  Cardiovascular: Normal rate, regular rhythm, normal heart sounds and intact distal pulses.  Exam reveals no gallop and no friction rub.   No murmur heard. Pulmonary/Chest: Effort normal and breath sounds normal.  o2 via cannulla  Abdominal: Soft. Bowel sounds are normal. She exhibits no distension and no mass. There is no tenderness.  Musculoskeletal: Normal range of motion.  Lymphadenopathy:    She has no cervical adenopathy.  Neurological: She is alert and oriented to person, place, and time. She has normal reflexes.  Skin: Skin is warm and dry.  Psychiatric: She has a normal mood and affect. Her behavior is normal. Judgment and thought content normal.    BP 159/81  Pulse 65  Temp(Src) 97.1 F (36.2 C) (Oral)  Wt 125 lb (56.7 kg)       Assessment & Plan:   1. Hypertension   2. Anemia   3. Peripheral edema   4. COPD exacerbation   5. Pulmonary emphysema   6. Osteoporosis, unspecified   7. Hypoxemia   8. Hypokalemia   9. HTN (hypertension)   10. Acute bronchitis   11. Acute blood loss anemia    Orders Placed This Encounter  Procedures  . CMP14+EGFR  .  NMR, lipoprofile  . Anemia Profile B   Meds ordered this encounter  Medications  . lisinopril (PRINIVIL,ZESTRIL) 40 MG tablet    Sig: Take 1 tablet (40 mg total) by mouth daily.    Dispense:  30 tablet    Refill:  5    Order Specific Question:  Supervising Provider    Answer:  Ernestina Penna [[id number]]  . ferrous sulfate (FERROUSUL) 325 (65 FE) MG tablet    Sig: Take 1 tablet (325 mg total) by mouth daily with breakfast.    Dispense:  30 tablet    Refill:  5    Order Specific Question:  Supervising Provider    Answer:  Ernestina Penna [[id number]]  . furosemide (LASIX) 20 MG tablet    Sig: Take 1 tablet (20 mg total) by mouth daily as needed (Fluid).    Dispense:  30 tablet    Refill:  5    Order Specific Question:  Supervising Provider    Answer:  Ernestina Penna [[id number]]  . albuterol-ipratropium (COMBIVENT) 18-103 MCG/ACT inhaler    Sig: Inhale 2 puffs into the lungs every 6 (six) hours as needed for wheezing.    Dispense:  1 Inhaler    Refill:  5    Order Specific Question:  Supervising Provider    Answer:  Ernestina Penna [[id number]]  . citalopram (CELEXA) 20 MG tablet    Sig: Take 1 tablet (20 mg total) by mouth daily.    Dispense:  30 tablet    Refill:  3    Order Specific Question:  Supervising Provider    Answer:  Ernestina Penna [[id number]]  . ALPRAZolam (XANAX) 0.25 MG tablet    Sig: Take 1 tablet (0.25 mg total) by mouth at bedtime as needed for sleep.    Dispense:  30 tablet    Refill:  2    Order Specific Question:  Supervising Provider    Answer:  Deborra Medina    Continue all meds Labs pending Diet and exercise encouraged Health maintenance reviewed Follow up in 3 months Refuses most of health maintenance Mary-Margaret Daphine Deutscher, FNP

## 2013-10-15 LAB — ANEMIA PROFILE B
Basophils Absolute: 0.1 10*3/uL (ref 0.0–0.2)
Eos: 7 %
Eosinophils Absolute: 0.3 10*3/uL (ref 0.0–0.4)
Ferritin: 150 ng/mL (ref 15–150)
Immature Grans (Abs): 0 10*3/uL (ref 0.0–0.1)
Immature Granulocytes: 0 %
Iron Saturation: 29 % (ref 15–55)
Lymphocytes Absolute: 1.5 10*3/uL (ref 0.7–3.1)
MCH: 29.2 pg (ref 26.6–33.0)
Monocytes Absolute: 0.4 10*3/uL (ref 0.1–0.9)
Monocytes: 9 %
Neutrophils Absolute: 2.7 10*3/uL (ref 1.4–7.0)
Neutrophils Relative %: 53 %
Platelets: 195 10*3/uL (ref 150–379)
RBC: 4.49 x10E6/uL (ref 3.77–5.28)
RDW: 13.4 % (ref 12.3–15.4)
UIBC: 213 ug/dL (ref 150–375)
Vitamin B-12: 190 pg/mL — ABNORMAL LOW (ref 211–946)
WBC: 5 10*3/uL (ref 3.4–10.8)

## 2013-10-15 LAB — CMP14+EGFR
Albumin/Globulin Ratio: 2.2 (ref 1.1–2.5)
Albumin: 4 g/dL (ref 3.5–4.8)
Alkaline Phosphatase: 74 IU/L (ref 39–117)
BUN/Creatinine Ratio: 13 (ref 11–26)
BUN: 7 mg/dL — ABNORMAL LOW (ref 8–27)
Creatinine, Ser: 0.53 mg/dL — ABNORMAL LOW (ref 0.57–1.00)
GFR calc Af Amer: 106 mL/min/{1.73_m2} (ref >59)
GFR calc non Af Amer: 92 mL/min/{1.73_m2} (ref >59)
Globulin, Total: 1.8 g/dL (ref 1.5–4.5)
Total Bilirubin: 0.4 mg/dL (ref 0.0–1.2)

## 2013-10-15 LAB — NMR, LIPOPROFILE
Cholesterol: 229 mg/dL — ABNORMAL HIGH (ref <200)
HDL Particle Number: 32.5 umol/L (ref >=30.5)
LDL Size: 21.1 nm (ref >20.5)
LDLC SERPL CALC-MCNC: 134 mg/dL — ABNORMAL HIGH (ref <100)
LP-IR Score: <25 (ref <=45)
Small LDL Particle Number: 181 nmol/L (ref <=527)

## 2013-11-19 ENCOUNTER — Ambulatory Visit (INDEPENDENT_AMBULATORY_CARE_PROVIDER_SITE_OTHER): Payer: Medicare Other | Admitting: Nurse Practitioner

## 2013-11-19 ENCOUNTER — Encounter: Payer: Self-pay | Admitting: Nurse Practitioner

## 2013-11-19 VITALS — BP 142/88 | HR 58 | Temp 97.3°F | Ht 65.0 in | Wt 116.0 lb

## 2013-11-19 DIAGNOSIS — J438 Other emphysema: Secondary | ICD-10-CM | POA: Diagnosis not present

## 2013-11-19 DIAGNOSIS — I10 Essential (primary) hypertension: Secondary | ICD-10-CM | POA: Diagnosis not present

## 2013-11-19 DIAGNOSIS — J449 Chronic obstructive pulmonary disease, unspecified: Secondary | ICD-10-CM | POA: Diagnosis not present

## 2013-11-19 DIAGNOSIS — E876 Hypokalemia: Secondary | ICD-10-CM

## 2013-11-19 DIAGNOSIS — J439 Emphysema, unspecified: Secondary | ICD-10-CM

## 2013-11-19 NOTE — Progress Notes (Addendum)
Subjective:    Patient ID: Tiffany Maxwell, female    DOB: December 31, 1933, 79 y.o.   MRN: [id number]  HPI Patient brought in by daughter in law an dson foe follow up- She is actually doing quite well. Still on O2 all the time. She denies any SOB- no recent exacerbation of COPD. She actually has no complaints today. Patient Active Problem List   Diagnosis Date Noted  . Acute blood loss anemia 04/30/2013  . Hip fracture 04/28/2013  . Leukocytosis 04/28/2013  . Multiple fractures of thoracic spine, closed 01/30/2013  . Cholelithiasis 01/30/2013  . Pulmonary emphysema 01/30/2013  . Acute bronchitis 01/30/2013  . History of tobacco use 01/30/2013  . Diastolic dysfunction 01/30/2013  . Hypokalemia 01/29/2013  . Osteoporosis, unspecified 01/29/2013  . COPD with chronic bronchitis 01/28/2013  . Compression fracture 01/28/2013  . Hypoxemia 01/28/2013  . HTN (hypertension) 01/28/2013  . Rash 01/28/2013   Outpatient Encounter Prescriptions as of 11/19/2013  Medication Sig  . albuterol-ipratropium (COMBIVENT) 18-103 MCG/ACT inhaler Inhale 2 puffs into the lungs every 6 (six) hours as needed for wheezing.  Marland Kitchen ALPRAZolam (XANAX) 0.25 MG tablet Take 1 tablet (0.25 mg total) by mouth at bedtime as needed for sleep.  Marland Kitchen aspirin EC 81 MG tablet Take 81 mg by mouth daily.  . Calcium Carbonate-Vitamin D (CALCIUM-VITAMIN D) 500-200 MG-UNIT per tablet Take 1 tablet by mouth daily.  . citalopram (CELEXA) 20 MG tablet Take 1 tablet (20 mg total) by mouth daily.  . ferrous sulfate (FERROUSUL) 325 (65 FE) MG tablet Take 1 tablet (325 mg total) by mouth daily with breakfast.  . furosemide (LASIX) 20 MG tablet Take 1 tablet (20 mg total) by mouth daily as needed (Fluid).  Marland Kitchen lisinopril (PRINIVIL,ZESTRIL) 40 MG tablet Take 1 tablet (40 mg total) by mouth daily.  Marland Kitchen oxyCODONE-acetaminophen (PERCOCET/ROXICET) 5-325 MG per tablet Take 1-2 tablets by mouth every 4 (four) hours as needed.  . polyethylene glycol (MIRALAX  / GLYCOLAX) packet Take 17 g by mouth daily.       Review of Systems  Constitutional: Negative.   HENT: Negative.   Respiratory: Negative.   Cardiovascular: Negative.   Gastrointestinal: Negative.   Genitourinary: Negative.   Musculoskeletal: Negative.   Neurological: Negative.   Hematological: Negative.   Psychiatric/Behavioral: Negative.   All other systems reviewed and are negative.       Objective:   Physical Exam  Constitutional: She is oriented to person, place, and time. She appears well-developed and well-nourished.  HENT:  Nose: Nose normal.  Mouth/Throat: Oropharynx is clear and moist.  Eyes: EOM are normal.  Neck: Trachea normal, normal range of motion and full passive range of motion without pain. Neck supple. No JVD present. Carotid bruit is not present. No thyromegaly present.  Cardiovascular: Normal rate, regular rhythm, normal heart sounds and intact distal pulses.  Exam reveals no gallop and no friction rub.   No murmur heard. Pulmonary/Chest: Effort normal and breath sounds normal.  Abdominal: Soft. Bowel sounds are normal. She exhibits no distension and no mass. There is no tenderness.  Musculoskeletal: Normal range of motion.  Lymphadenopathy:    She has no cervical adenopathy.  Neurological: She is alert and oriented to person, place, and time. She has normal reflexes.  Skin: Skin is warm and dry.  Psychiatric: She has a normal mood and affect. Her behavior is normal. Judgment and thought content normal.   BP 214/94  Pulse 58  Temp(Src) 97.3 F (36.3 C) (Oral)  Ht 5\' 5"  (1.651 m)  Wt 116 lb (52.617 kg)  BMI 19.30 kg/m2  SpO2 98%  o2 sat at room air 88% and below      Assessment & Plan:   1. COPD with chronic bronchitis   2. HTN (hypertension)   3. Hypokalemia   4. Pulmonary emphysema    Orders Placed This Encounter  Procedures  . CMP14+EGFR  . NMR, lipoprofile    Patient was anemic at last visit- has not started on iron- needs to  be on iron Continue all meds Labs pending Diet and exercise encouraged Health maintenance reviewed Follow up in 3 MONTHS  Mary-Margaret Daphine Deutscher, FNP

## 2013-11-19 NOTE — Patient Instructions (Signed)

## 2013-12-31 ENCOUNTER — Other Ambulatory Visit: Payer: Self-pay | Admitting: Nurse Practitioner

## 2014-01-05 NOTE — Telephone Encounter (Signed)
rx called to pharmacy 

## 2014-01-05 NOTE — Telephone Encounter (Signed)
Last seen 11/19/13, last filled 10/13/13, with 2 RF. Pt uses [facility]

## 2014-02-11 ENCOUNTER — Telehealth: Payer: Self-pay | Admitting: Nurse Practitioner

## 2014-02-11 NOTE — Telephone Encounter (Signed)
ntbs

## 2014-02-12 NOTE — Telephone Encounter (Signed)
Patient aware.

## 2014-02-13 ENCOUNTER — Telehealth: Payer: Self-pay | Admitting: Nurse Practitioner

## 2014-02-16 NOTE — Telephone Encounter (Signed)
Left message for pt to return call.

## 2014-02-18 NOTE — Telephone Encounter (Signed)
Spoke with pt daughter and she refused appt at this time

## 2014-03-10 ENCOUNTER — Emergency Department (HOSPITAL_COMMUNITY)
Admission: EM | Admit: 2014-03-10 | Discharge: 2014-03-11 | Disposition: A | Discharge status: Home / Self Care | Payer: Medicare Other | Attending: Emergency Medicine | Admitting: Emergency Medicine

## 2014-03-10 ENCOUNTER — Encounter (HOSPITAL_COMMUNITY): Payer: Self-pay | Admitting: Emergency Medicine

## 2014-03-10 DIAGNOSIS — Y9389 Activity, other specified: Secondary | ICD-10-CM | POA: Insufficient documentation

## 2014-03-10 DIAGNOSIS — G319 Degenerative disease of nervous system, unspecified: Secondary | ICD-10-CM | POA: Diagnosis not present

## 2014-03-10 DIAGNOSIS — S0990XA Unspecified injury of head, initial encounter: Secondary | ICD-10-CM | POA: Diagnosis not present

## 2014-03-10 DIAGNOSIS — Z8719 Personal history of other diseases of the digestive system: Secondary | ICD-10-CM | POA: Diagnosis not present

## 2014-03-10 DIAGNOSIS — Z7982 Long term (current) use of aspirin: Secondary | ICD-10-CM | POA: Diagnosis not present

## 2014-03-10 DIAGNOSIS — I10 Essential (primary) hypertension: Secondary | ICD-10-CM | POA: Diagnosis not present

## 2014-03-10 DIAGNOSIS — Z87891 Personal history of nicotine dependence: Secondary | ICD-10-CM | POA: Insufficient documentation

## 2014-03-10 DIAGNOSIS — Z79899 Other long term (current) drug therapy: Secondary | ICD-10-CM | POA: Diagnosis not present

## 2014-03-10 DIAGNOSIS — M810 Age-related osteoporosis without current pathological fracture: Secondary | ICD-10-CM | POA: Insufficient documentation

## 2014-03-10 DIAGNOSIS — S0993XA Unspecified injury of face, initial encounter: Secondary | ICD-10-CM | POA: Diagnosis not present

## 2014-03-10 DIAGNOSIS — Z872 Personal history of diseases of the skin and subcutaneous tissue: Secondary | ICD-10-CM | POA: Diagnosis not present

## 2014-03-10 DIAGNOSIS — W010XXA Fall on same level from slipping, tripping and stumbling without subsequent striking against object, initial encounter: Secondary | ICD-10-CM | POA: Insufficient documentation

## 2014-03-10 DIAGNOSIS — E785 Hyperlipidemia, unspecified: Secondary | ICD-10-CM | POA: Insufficient documentation

## 2014-03-10 DIAGNOSIS — J438 Other emphysema: Secondary | ICD-10-CM | POA: Insufficient documentation

## 2014-03-10 DIAGNOSIS — I252 Old myocardial infarction: Secondary | ICD-10-CM | POA: Insufficient documentation

## 2014-03-10 DIAGNOSIS — E876 Hypokalemia: Secondary | ICD-10-CM | POA: Insufficient documentation

## 2014-03-10 DIAGNOSIS — Z8781 Personal history of (healed) traumatic fracture: Secondary | ICD-10-CM | POA: Insufficient documentation

## 2014-03-10 DIAGNOSIS — Y92009 Unspecified place in unspecified non-institutional (private) residence as the place of occurrence of the external cause: Secondary | ICD-10-CM | POA: Insufficient documentation

## 2014-03-10 DIAGNOSIS — I6789 Other cerebrovascular disease: Secondary | ICD-10-CM | POA: Insufficient documentation

## 2014-03-10 DIAGNOSIS — R944 Abnormal results of kidney function studies: Secondary | ICD-10-CM | POA: Insufficient documentation

## 2014-03-10 DIAGNOSIS — E873 Alkalosis: Secondary | ICD-10-CM | POA: Diagnosis not present

## 2014-03-10 DIAGNOSIS — N390 Urinary tract infection, site not specified: Secondary | ICD-10-CM

## 2014-03-10 DIAGNOSIS — W19XXXA Unspecified fall, initial encounter: Secondary | ICD-10-CM

## 2014-03-10 DIAGNOSIS — R748 Abnormal levels of other serum enzymes: Secondary | ICD-10-CM

## 2014-03-10 NOTE — ED Notes (Signed)
Pt and family report that pt had fallen approx 130 pm this afternoon and possibly was laying on the floor for about 4 hours and could not get up on her own.  Pt states she is just sore all over but denies injury from fall.

## 2014-03-10 NOTE — ED Provider Notes (Signed)
CSN: [id number]     Arrival date & time 03/10/14  2307 History  This chart was scribed for Tiffany Fuel, MD by Rolanda Lundborg, ED Scribe. This patient was seen in room APA01/APA01 and the patient's care was started at 11:49 PM.    No chief complaint on file.    (Consider location/radiation/quality/duration/timing/severity/associated sxs/prior Treatment) The history is provided by the patient. No language interpreter was used.   HPI Comments: Tiffany Maxwell is a 80 y.o. female who presents to the Emergency Department complaining of a fall after losing her balance and landing on her buttocks at 1:30pm today. Pt states she was lying on the floor for about 4 hours before she was able to get up. She denies injuries but states she is mildly sore in the legs. She reports soreness on the buttocks that she thinks may be a bedsore.  PCP - [name]   Past Medical History  Diagnosis Date  . Osteoporosis   . Palpitations   . PAC (premature atrial contraction)   . Seborrheic keratosis   . Hypertension   . Hyperlipidemia   . COPD (chronic obstructive pulmonary disease)   . MI (myocardial infarction)   . Multiple fractures of thoracic spine, closed 01/30/2013  . Cholelithiasis 01/30/2013  . Pulmonary emphysema 01/30/2013  . History of tobacco use 01/30/2013  . Diastolic dysfunction [id number]    Grade 1. Ejection fraction 65%.   Past Surgical History  Procedure Laterality Date  . Tubal ligation    . Compression hip screw Left 04/29/2013    Procedure: COMPRESSION HIP;  Surgeon: Sanjuana Kava, MD;  Location: [facility];  Service: Orthopedics;  Laterality: Left;  [place]   No family history on file. History  Substance Use Topics  . Smoking status: Former Smoker -- 1.00 packs/day for 40 years    Types: Cigarettes    Quit date: 07/10/2012  . Smokeless tobacco: Never Used  . Alcohol Use: No   OB History   Grav Para Term Preterm Abortions TAB SAB Ect Mult Living                  Review of Systems  All other systems reviewed and are negative.      Allergies  Codeine; Benicar hct; and Naproxen  Home Medications   Current Outpatient Rx  Name  Route  Sig  Dispense  Refill  . albuterol-ipratropium (COMBIVENT) 18-103 MCG/ACT inhaler   Inhalation   Inhale 2 puffs into the lungs every 6 (six) hours as needed for wheezing.   1 Inhaler   5   . ALPRAZolam (XANAX) 0.25 MG tablet      TAKE 1 TABLET AT BEDTIME AS NEEDED FOR SLEEP   30 tablet   1   . aspirin EC 81 MG tablet   Oral   Take 81 mg by mouth daily.         . Calcium Carbonate-Vitamin D (CALCIUM-VITAMIN D) 500-200 MG-UNIT per tablet   Oral   Take 1 tablet by mouth daily.         . citalopram (CELEXA) 20 MG tablet   Oral   Take 1 tablet (20 mg total) by mouth daily.   30 tablet   3   . ferrous sulfate (FERROUSUL) 325 (65 FE) MG tablet   Oral   Take 1 tablet (325 mg total) by mouth daily with breakfast.   30 tablet   5   . furosemide (LASIX) 20 MG tablet   Oral  Take 1 tablet (20 mg total) by mouth daily as needed (Fluid).   30 tablet   5   . lisinopril (PRINIVIL,ZESTRIL) 40 MG tablet   Oral   Take 1 tablet (40 mg total) by mouth daily.   30 tablet   5   . oxyCODONE-acetaminophen (PERCOCET/ROXICET) 5-325 MG per tablet   Oral   Take 1-2 tablets by mouth every 4 (four) hours as needed.   240 tablet   0   . polyethylene glycol (MIRALAX / GLYCOLAX) packet   Oral   Take 17 g by mouth daily.   30 packet   5    BP 158/93  Pulse 74  Temp(Src) 98.5 F (36.9 C) (Oral)  Resp 24  Ht 5\' 4"  (1.626 m)  Wt 145 lb (65.772 kg)  BMI 24.88 kg/m2  SpO2 96% Physical Exam  Nursing note and vitals reviewed. Constitutional: She is oriented to person, place, and time. She appears well-developed and well-nourished. No distress.  HENT:  Head: Normocephalic and atraumatic.  Eyes: EOM are normal. Pupils are equal, round, and reactive to light.  Neck: Neck supple. No JVD present. No  tracheal deviation present.  Cardiovascular: Normal rate and regular rhythm.  Exam reveals no friction rub.   No murmur heard. Pulmonary/Chest: Effort normal. No respiratory distress. She has no wheezes. She has no rales.  Abdominal: Soft. She exhibits no mass. There is no tenderness.  Musculoskeletal: Normal range of motion. She exhibits no edema and no tenderness.  Lymphadenopathy:    She has no cervical adenopathy.  Neurological: She is alert and oriented to person, place, and time. A cranial nerve deficit is present.  Skin: Skin is warm and dry. No rash noted.  Psychiatric: She has a normal mood and affect. Her behavior is normal.  No decubitus ulcer seen.  ED Course  Procedures (including critical care time) Medications - No data to display  DIAGNOSTIC STUDIES: Oxygen Saturation is 96% on RA, normal by my interpretation.    COORDINATION OF CARE: 11:57 PM- Discussed treatment plan with pt. Pt agrees to plan.    Labs Review Results for orders placed during the hospital encounter of 03/10/14  CBC WITH DIFFERENTIAL      Result Value Ref Range   WBC 4.3  4.0 - 10.5 K/uL   RBC 4.58  3.87 - 5.11 MIL/uL   Hemoglobin 13.3  12.0 - 15.0 g/dL   HCT 38.9  36.0 - 46.0 %   MCV 84.9  78.0 - 100.0 fL   MCH 29.0  26.0 - 34.0 pg   MCHC 34.2  30.0 - 36.0 g/dL   RDW 12.5  11.5 - 15.5 %   Platelets 123 (*) 150 - 400 K/uL   Neutrophils Relative % 65  43 - 77 %   Neutro Abs 2.8  1.7 - 7.7 K/uL   Lymphocytes Relative 23  12 - 46 %   Lymphs Abs 1.0  0.7 - 4.0 K/uL   Monocytes Relative 12  3 - 12 %   Monocytes Absolute 0.5  0.1 - 1.0 K/uL   Eosinophils Relative 0  0 - 5 %   Eosinophils Absolute 0.0  0.0 - 0.7 K/uL   Basophils Relative 0  0 - 1 %   Basophils Absolute 0.0  0.0 - 0.1 K/uL  COMPREHENSIVE METABOLIC PANEL      Result Value Ref Range   Sodium 137  137 - 147 mEq/L   Potassium 2.7 (*) 3.7 - 5.3 mEq/L  Chloride 91 (*) 96 - 112 mEq/L   CO2 35 (*) 19 - 32 mEq/L   Glucose, Bld  126 (*) 70 - 99 mg/dL   BUN 9  6 - 23 mg/dL   Creatinine, Ser 0.51  0.50 - 1.10 mg/dL   Calcium 9.7  8.4 - 10.5 mg/dL   Total Protein 6.7  6.0 - 8.3 g/dL   Albumin 3.4 (*) 3.5 - 5.2 g/dL   AST 39 (*) 0 - 37 U/L   ALT 10  0 - 35 U/L   Alkaline Phosphatase 78  39 - 117 U/L   Total Bilirubin 0.4  0.3 - 1.2 mg/dL   GFR calc non Af Amer >90  >90 mL/min   GFR calc Af Amer >90  >90 mL/min  CK      Result Value Ref Range   Total CK 707 (*) 7 - 177 U/L  URINALYSIS, ROUTINE W REFLEX MICROSCOPIC      Result Value Ref Range   Color, Urine YELLOW  YELLOW   APPearance CLEAR  CLEAR   Specific Gravity, Urine 1.020  1.005 - 1.030   pH 6.0  5.0 - 8.0   Glucose, UA NEGATIVE  NEGATIVE mg/dL   Hgb urine dipstick MODERATE (*) NEGATIVE   Bilirubin Urine NEGATIVE  NEGATIVE   Ketones, ur NEGATIVE  NEGATIVE mg/dL   Protein, ur NEGATIVE  NEGATIVE mg/dL   Urobilinogen, UA 0.2  0.0 - 1.0 mg/dL   Nitrite NEGATIVE  NEGATIVE   Leukocytes, UA NEGATIVE  NEGATIVE  LACTIC ACID, PLASMA      Result Value Ref Range   Lactic Acid, Venous 1.5  0.5 - 2.2 mmol/L  URINE MICROSCOPIC-ADD ON      Result Value Ref Range   Squamous Epithelial / LPF FEW (*) RARE   WBC, UA 0-2  <3 WBC/hpf   RBC / HPF 0-2  <3 RBC/hpf   Bacteria, UA MANY (*) RARE   Urine-Other MUCOUS PRESENT     Imaging Review Ct Head Wo Contrast  03/11/2014   CLINICAL DATA:  Fall earlier today  EXAM: CT HEAD WITHOUT CONTRAST  CT CERVICAL SPINE WITHOUT CONTRAST  TECHNIQUE: Multidetector CT imaging of the head and cervical spine was performed following the standard protocol without intravenous contrast. Multiplanar CT image reconstructions of the cervical spine were also generated.  COMPARISON:  Prior brain MRI 12/06/2009  FINDINGS: CT HEAD FINDINGS  Negative for acute intracranial hemorrhage, acute infarction, mass, mass effect, hydrocephalus or midline shift. Gray-white differentiation is preserved throughout. Periventricular, subcortical and deep white matter  hypoattenuation most consistent with chronic longstanding microvascular ischemic change. Comparing across modalities, mild interval progression compared to December of 2010. Globes and orbits are intact and unremarkable bilaterally. Bilateral lens extractions. No focal scalp hematoma or calvarial abnormality. The bones appear osteopenic. Normal aeration of the mastoid air cells and visualized paranasal sinuses. Atherosclerotic calcifications in the bilateral cavernous carotid arteries.  CT CERVICAL SPINE FINDINGS  Heterogeneous multinodular thyroid gland. Small nodules are subcentimeter in size. Dystrophic calcifications are noted in the left gland. The inferior aspect of the gland is incompletely imaged. No acute soft tissue abnormality. No suspicious adenopathy. Atherosclerotic calcifications noted in the left greater than right carotid arteries. Combined paraseptal and centrilobular emphysema in the upper lungs. Left greater than right pleural parenchymal scarring. No acute fracture, malalignment or prevertebral soft tissue swelling. The bones are osteopenic in appearance. Mild multilevel cervical spondylosis No significant at C5-C6 and C6-C7.  IMPRESSION: CT HEAD  1. No  acute intracranial abnormality. 2. Moderately advanced chronic longstanding microvascular ischemic white matter disease which demonstrates progression compared to December of 2010. 3. Intracranial atherosclerosis. C SPINE  1. No acute fracture or malalignment. 2. Mild-to-moderate multilevel cervical spondylosis. 3. The bones appear osteopenic. 4. Combined paraseptal and centrilobular emphysema. 5. Heterogeneous multinodular thyroid gland.   Electronically Signed   By: Jacqulynn Cadet M.D.   On: 03/11/2014 01:11   Ct Cervical Spine Wo Contrast  03/11/2014   CLINICAL DATA:  Fall earlier today  EXAM: CT HEAD WITHOUT CONTRAST  CT CERVICAL SPINE WITHOUT CONTRAST  TECHNIQUE: Multidetector CT imaging of the head and cervical spine was performed  following the standard protocol without intravenous contrast. Multiplanar CT image reconstructions of the cervical spine were also generated.  COMPARISON:  Prior brain MRI 12/06/2009  FINDINGS: CT HEAD FINDINGS  Negative for acute intracranial hemorrhage, acute infarction, mass, mass effect, hydrocephalus or midline shift. Gray-white differentiation is preserved throughout. Periventricular, subcortical and deep white matter hypoattenuation most consistent with chronic longstanding microvascular ischemic change. Comparing across modalities, mild interval progression compared to December of 2010. Globes and orbits are intact and unremarkable bilaterally. Bilateral lens extractions. No focal scalp hematoma or calvarial abnormality. The bones appear osteopenic. Normal aeration of the mastoid air cells and visualized paranasal sinuses. Atherosclerotic calcifications in the bilateral cavernous carotid arteries.  CT CERVICAL SPINE FINDINGS  Heterogeneous multinodular thyroid gland. Small nodules are subcentimeter in size. Dystrophic calcifications are noted in the left gland. The inferior aspect of the gland is incompletely imaged. No acute soft tissue abnormality. No suspicious adenopathy. Atherosclerotic calcifications noted in the left greater than right carotid arteries. Combined paraseptal and centrilobular emphysema in the upper lungs. Left greater than right pleural parenchymal scarring. No acute fracture, malalignment or prevertebral soft tissue swelling. The bones are osteopenic in appearance. Mild multilevel cervical spondylosis No significant at C5-C6 and C6-C7.  IMPRESSION: CT HEAD  1. No acute intracranial abnormality. 2. Moderately advanced chronic longstanding microvascular ischemic white matter disease which demonstrates progression compared to December of 2010. 3. Intracranial atherosclerosis. C SPINE  1. No acute fracture or malalignment. 2. Mild-to-moderate multilevel cervical spondylosis. 3. The bones  appear osteopenic. 4. Combined paraseptal and centrilobular emphysema. 5. Heterogeneous multinodular thyroid gland.   Electronically Signed   By: Jacqulynn Cadet M.D.   On: 03/11/2014 01:11    MDM   Final diagnoses:  Fall at home  Urinary tract infection  Hypokalemia  Elevated CK  Metabolic alkalosis    Follow inability to get up. She will be sent for CT of head and cervical spine to rule out occult injury but at a low index of suspicion for this. Laboratory workup will be done to look for causes of weakness.  She is noted to be severely hypokalemic and he also has a urinary tract infection. She'll be given oral and intravenous potassium as well as intravenous ceftriaxone.  After she has had potassium repleted, patient was ambulated and she was able to play with a walker. She states that she always every a walker at home. CKs come back mildly elevated but not to a dangerous level. She was given IV hydration in the ED. At this point, I do not feel she needs hospitalization. She can take oral fluids at home and she will be started on antibiotics for her UTI. She is discharged with a prescription for cephalexin. Have discussed with patient and her daughter desirability of getting a life alert system so that she can get help  promptly should she fall not be able to get up.  I personally performed the services described in this documentation, which was scribed in my presence. The recorded information has been reviewed and is accurate.     Tiffany Fuel, MD [id number]

## 2014-03-11 ENCOUNTER — Emergency Department (HOSPITAL_COMMUNITY): Payer: Medicare Other

## 2014-03-11 DIAGNOSIS — S0990XA Unspecified injury of head, initial encounter: Secondary | ICD-10-CM | POA: Diagnosis not present

## 2014-03-11 DIAGNOSIS — S0993XA Unspecified injury of face, initial encounter: Secondary | ICD-10-CM | POA: Diagnosis not present

## 2014-03-11 LAB — CBC WITH DIFFERENTIAL/PLATELET
BASOS ABS: 0 10*3/uL (ref 0.0–0.1)
BASOS PCT: 0 % (ref 0–1)
EOS ABS: 0 10*3/uL (ref 0.0–0.7)
Eosinophils Relative: 0 % (ref 0–5)
HCT: 38.9 % (ref 36.0–46.0)
Hemoglobin: 13.3 g/dL (ref 12.0–15.0)
Lymphocytes Relative: 23 % (ref 12–46)
Lymphs Abs: 1 10*3/uL (ref 0.7–4.0)
MCH: 29 pg (ref 26.0–34.0)
MCHC: 34.2 g/dL (ref 30.0–36.0)
MCV: 84.9 fL (ref 78.0–100.0)
Monocytes Absolute: 0.5 10*3/uL (ref 0.1–1.0)
Monocytes Relative: 12 % (ref 3–12)
NEUTROS ABS: 2.8 10*3/uL (ref 1.7–7.7)
NEUTROS PCT: 65 % (ref 43–77)
Platelets: 123 10*3/uL — ABNORMAL LOW (ref 150–400)
RBC: 4.58 MIL/uL (ref 3.87–5.11)
RDW: 12.5 % (ref 11.5–15.5)
WBC: 4.3 10*3/uL (ref 4.0–10.5)

## 2014-03-11 LAB — URINALYSIS, ROUTINE W REFLEX MICROSCOPIC
Bilirubin Urine: NEGATIVE
GLUCOSE, UA: NEGATIVE mg/dL
KETONES UR: NEGATIVE mg/dL
LEUKOCYTES UA: NEGATIVE
NITRITE: NEGATIVE
PH: 6 (ref 5.0–8.0)
Protein, ur: NEGATIVE mg/dL
SPECIFIC GRAVITY, URINE: 1.02 (ref 1.005–1.030)
Urobilinogen, UA: 0.2 mg/dL (ref 0.0–1.0)

## 2014-03-11 LAB — COMPREHENSIVE METABOLIC PANEL
ALBUMIN: 3.4 g/dL — AB (ref 3.5–5.2)
ALT: 10 U/L (ref 0–35)
AST: 39 U/L — AB (ref 0–37)
Alkaline Phosphatase: 78 U/L (ref 39–117)
BILIRUBIN TOTAL: 0.4 mg/dL (ref 0.3–1.2)
BUN: 9 mg/dL (ref 6–23)
CHLORIDE: 91 meq/L — AB (ref 96–112)
CO2: 35 mEq/L — ABNORMAL HIGH (ref 19–32)
Calcium: 9.7 mg/dL (ref 8.4–10.5)
Creatinine, Ser: 0.51 mg/dL (ref 0.50–1.10)
GFR calc Af Amer: >90 mL/min (ref >90)
GFR calc non Af Amer: >90 mL/min (ref >90)
Glucose, Bld: 126 mg/dL — ABNORMAL HIGH (ref 70–99)
Potassium: 2.7 mEq/L — CL (ref 3.7–5.3)
Sodium: 137 mEq/L (ref 137–147)
TOTAL PROTEIN: 6.7 g/dL (ref 6.0–8.3)

## 2014-03-11 LAB — LACTIC ACID, PLASMA: Lactic Acid, Venous: 1.5 mmol/L (ref 0.5–2.2)

## 2014-03-11 LAB — URINE MICROSCOPIC-ADD ON

## 2014-03-11 LAB — CK: CK TOTAL: 707 U/L — AB (ref 7–177)

## 2014-03-11 MED ORDER — POTASSIUM CHLORIDE CRYS ER 20 MEQ PO TBCR
40.0000 meq | EXTENDED_RELEASE_TABLET | Freq: Once | ORAL | Status: AC
  Administered 2014-03-11: 40 meq via ORAL
  Filled 2014-03-11: qty 2

## 2014-03-11 MED ORDER — POTASSIUM CHLORIDE 10 MEQ/100ML IV SOLN
10.0000 meq | Freq: Once | INTRAVENOUS | Status: AC
  Administered 2014-03-11: 10 meq via INTRAVENOUS
  Filled 2014-03-11: qty 100

## 2014-03-11 MED ORDER — SODIUM CHLORIDE 0.9 % IV SOLN: 1000.0000 mL | INTRAVENOUS | Status: DC

## 2014-03-11 MED ORDER — DEXTROSE 5 % IV SOLN
1.0000 g | Freq: Once | INTRAVENOUS | Status: AC
  Administered 2014-03-11: 1 g via INTRAVENOUS
  Filled 2014-03-11: qty 10

## 2014-03-11 MED ORDER — CEPHALEXIN 500 MG PO CAPS: 500.0000 mg | ORAL_CAPSULE | Freq: Four times a day (QID) | ORAL | Status: DC

## 2014-03-11 MED ORDER — SODIUM CHLORIDE 0.9 % IV SOLN
1000.0000 mL | Freq: Once | INTRAVENOUS | Status: AC
  Administered 2014-03-11: 1000 mL via INTRAVENOUS

## 2014-03-11 MED ORDER — POTASSIUM CHLORIDE CRYS ER 20 MEQ PO TBCR: 20.0000 meq | EXTENDED_RELEASE_TABLET | Freq: Every day | ORAL | Status: DC

## 2014-03-11 NOTE — Discharge Instructions (Signed)
Consider getting a life-alert system so you can call for help if you fall and can't get up.  Drink plenty of fluids for the next two days.  Fall Prevention and Home Safety Falls cause injuries and can affect all age groups. It is possible to use preventive measures to significantly decrease the likelihood of falls. There are many simple measures which can make your home safer and prevent falls. OUTDOORS  Repair cracks and edges of walkways and driveways.  Remove high doorway thresholds.  Trim shrubbery on the main path into your home.  Have good outside lighting.  Clear walkways of tools, rocks, debris, and clutter.  Check that handrails are not broken and are securely fastened. Both sides of steps should have handrails.  Have leaves, snow, and ice cleared regularly.  Use sand or salt on walkways during winter months.  In the garage, clean up grease or oil spills. BATHROOM  Install night lights.  Install grab bars by the toilet and in the tub and shower.  Use non-skid mats or decals in the tub or shower.  Place a plastic non-slip stool in the shower to sit on, if needed.  Keep floors dry and clean up all water on the floor immediately.  Remove soap buildup in the tub or shower on a regular basis.  Secure bath mats with non-slip, double-sided rug tape.  Remove throw rugs and tripping hazards from the floors. BEDROOMS  Install night lights.  Make sure a bedside light is easy to reach.  Do not use oversized bedding.  Keep a telephone by your bedside.  Have a firm chair with side arms to use for getting dressed.  Remove throw rugs and tripping hazards from the floor. KITCHEN  Keep handles on pots and pans turned toward the center of the stove. Use back burners when possible.  Clean up spills quickly and allow time for drying.  Avoid walking on wet floors.  Avoid hot utensils and knives.  Position shelves so they are not too high or low.  Place commonly  used objects within easy reach.  If necessary, use a sturdy step stool with a grab bar when reaching.  Keep electrical cables out of the way.  Do not use floor polish or wax that makes floors slippery. If you must use wax, use non-skid floor wax.  Remove throw rugs and tripping hazards from the floor. STAIRWAYS  Never leave objects on stairs.  Place handrails on both sides of stairways and use them. Fix any loose handrails. Make sure handrails on both sides of the stairways are as long as the stairs.  Check carpeting to make sure it is firmly attached along stairs. Make repairs to worn or loose carpet promptly.  Avoid placing throw rugs at the top or bottom of stairways, or properly secure the rug with carpet tape to prevent slippage. Get rid of throw rugs, if possible.  Have an electrician put in a light switch at the top and bottom of the stairs. OTHER FALL PREVENTION TIPS  Wear low-heel or rubber-soled shoes that are supportive and fit well. Wear closed toe shoes.  When using a stepladder, make sure it is fully opened and both spreaders are firmly locked. Do not climb a closed stepladder.  Add color or contrast paint or tape to grab bars and handrails in your home. Place contrasting color strips on first and last steps.  Learn and use mobility aids as needed. Install an electrical emergency response system.  Turn on lights  to avoid dark areas. Replace light bulbs that burn out immediately. Get light switches that glow.  Arrange furniture to create clear pathways. Keep furniture in the same place.  Firmly attach carpet with non-skid or double-sided tape.  Eliminate uneven floor surfaces.  Select a carpet pattern that does not visually hide the edge of steps.  Be aware of all pets. OTHER HOME SAFETY TIPS  Set the water temperature for 120 F (48.8 C).  Keep emergency numbers on or near the telephone.  Keep smoke detectors on every level of the home and near sleeping  areas. Document Released: 11/17/2002 Document Revised: 05/28/2012 Document Reviewed: 02/16/2012 [facility] Patient Information 2014 [place].  Urinary Tract Infection Urinary tract infections (UTIs) can develop anywhere along your urinary tract. Your urinary tract is your body's drainage system for removing wastes and extra water. Your urinary tract includes two kidneys, two ureters, a bladder, and a urethra. Your kidneys are a pair of bean-shaped organs. Each kidney is about the size of your fist. They are located below your ribs, one on each side of your spine. CAUSES Infections are caused by microbes, which are microscopic organisms, including fungi, viruses, and bacteria. These organisms are so small that they can only be seen through a microscope. Bacteria are the microbes that most commonly cause UTIs. SYMPTOMS  Symptoms of UTIs may vary by age and gender of the patient and by the location of the infection. Symptoms in young women typically include a frequent and intense urge to urinate and a painful, burning feeling in the bladder or urethra during urination. Older women and men are more likely to be tired, shaky, and weak and have muscle aches and abdominal pain. A fever may mean the infection is in your kidneys. Other symptoms of a kidney infection include pain in your back or sides below the ribs, nausea, and vomiting. DIAGNOSIS To diagnose a UTI, your caregiver will ask you about your symptoms. Your caregiver also will ask to provide a urine sample. The urine sample will be tested for bacteria and white blood cells. White blood cells are made by your body to help fight infection. TREATMENT  Typically, UTIs can be treated with medication. Because most UTIs are caused by a bacterial infection, they usually can be treated with the use of antibiotics. The choice of antibiotic and length of treatment depend on your symptoms and the type of bacteria causing your infection. HOME CARE  INSTRUCTIONS  If you were prescribed antibiotics, take them exactly as your caregiver instructs you. Finish the medication even if you feel better after you have only taken some of the medication.  Drink enough water and fluids to keep your urine clear or pale yellow.  Avoid caffeine, tea, and carbonated beverages. They tend to irritate your bladder.  Empty your bladder often. Avoid holding urine for long periods of time.  Empty your bladder before and after sexual intercourse.  After a bowel movement, women should cleanse from front to back. Use each tissue only once. SEEK MEDICAL CARE IF:   You have back pain.  You develop a fever.  Your symptoms do not begin to resolve within 3 days. SEEK IMMEDIATE MEDICAL CARE IF:   You have severe back pain or lower abdominal pain.  You develop chills.  You have nausea or vomiting.  You have continued burning or discomfort with urination. MAKE SURE YOU:   Understand these instructions.  Will watch your condition.  Will get help right away if you are  not doing well or get worse. Document Released: 09/06/2005 Document Revised: 05/28/2012 Document Reviewed: 01/05/2012 [facility] Patient Information 2014 [place].  Hypokalemia Hypokalemia means that the amount of potassium in the blood is lower than normal.Potassium is a chemical, called an electrolyte, that helps regulate the amount of fluid in the body. It also stimulates muscle contraction and helps nerves function properly.Most of the body's potassium is inside of cells, and only a very small amount is in the blood. Because the amount in the blood is so small, minor changes can be life-threatening. CAUSES  Antibiotics.  Diarrhea or vomiting.  Using laxatives too much, which can cause diarrhea.  Chronic kidney disease.  Water pills (diuretics).  Eating disorders (bulimia).  Low magnesium level.  Sweating a lot. SIGNS AND  SYMPTOMS  Weakness.  Constipation.  Fatigue.  Muscle cramps.  Mental confusion.  Skipped heartbeats or irregular heartbeat (palpitations).  Tingling or numbness. DIAGNOSIS  Your health care provider can diagnose hypokalemia with blood tests. In addition to checking your potassium level, your health care provider may also check other lab tests. TREATMENT Hypokalemia can be treated with potassium supplements taken by mouth or adjustments in your current medicines. If your potassium level is very low, you may need to get potassium through a vein (IV) and be monitored in the hospital. A diet high in potassium is also helpful. Foods high in potassium are:  Nuts, such as peanuts and pistachios.  Seeds, such as sunflower seeds and pumpkin seeds.  Peas, lentils, and lima beans.  Whole grain and bran cereals and breads.  Fresh fruit and vegetables, such as apricots, avocado, bananas, cantaloupe, kiwi, oranges, tomatoes, asparagus, and potatoes.  Orange and tomato juices.  Red meats.  Fruit yogurt. HOME CARE INSTRUCTIONS  Take all medicines as prescribed by your health care provider.  Maintain a healthy diet by including nutritious food, such as fruits, vegetables, nuts, whole grains, and lean meats.  If you are taking a laxative, be sure to follow the directions on the label. SEEK MEDICAL CARE IF:  Your weakness gets worse.  You feel your heart pounding or racing.  You are vomiting or having diarrhea.  You are diabetic and having trouble keeping your blood glucose in the normal range. SEEK IMMEDIATE MEDICAL CARE IF:  You have chest pain, shortness of breath, or dizziness.  You are vomiting or having diarrhea for more than 2 days.  You faint. MAKE SURE YOU:   Understand these instructions.  Will watch your condition.  Will get help right away if you are not doing well or get worse. Document Released: 11/27/2005 Document Revised: 09/17/2013 Document Reviewed:  05/30/2013 [facility] Patient Information 2014 [place].  Cephalexin tablets or capsules What is this medicine? CEPHALEXIN (sef a LEX in) is a cephalosporin antibiotic. It is used to treat certain kinds of bacterial infections It will not work for colds, flu, or other viral infections. This medicine may be used for other purposes; ask your health care provider or pharmacist if you have questions. COMMON BRAND NAME(S): Biocef, Keflex, Keftab What should I tell my health care provider before I take this medicine? They need to know if you have any of these conditions: -kidney disease -stomach or intestine problems, especially colitis -an unusual or allergic reaction to cephalexin, other cephalosporins, penicillins, other antibiotics, medicines, foods, dyes or preservatives -pregnant or trying to get pregnant -breast-feeding How should I use this medicine? Take this medicine by mouth with a full glass of water. Follow the directions  on the prescription label. This medicine can be taken with or without food. Take your medicine at regular intervals. Do not take your medicine more often than directed. Take all of your medicine as directed even if you think you are better. Do not skip doses or stop your medicine early. Talk to your pediatrician regarding the use of this medicine in children. While this drug may be prescribed for selected conditions, precautions do apply. Overdosage: If you think you have taken too much of this medicine contact a poison control center or emergency room at once. NOTE: This medicine is only for you. Do not share this medicine with others. What if I miss a dose? If you miss a dose, take it as soon as you can. If it is almost time for your next dose, take only that dose. Do not take double or extra doses. There should be at least 4 to 6 hours between doses. What may interact with this medicine? -probenecid -some other antibiotics This list may not describe all  possible interactions. Give your health care provider a list of all the medicines, herbs, non-prescription drugs, or dietary supplements you use. Also tell them if you smoke, drink alcohol, or use illegal drugs. Some items may interact with your medicine. What should I watch for while using this medicine? Tell your doctor or health care professional if your symptoms do not begin to improve in a few days. Do not treat diarrhea with over the counter products. Contact your doctor if you have diarrhea that lasts more than 2 days or if it is severe and watery. If you have diabetes, you may get a false-positive result for sugar in your urine. Check with your doctor or health care professional. What side effects may I notice from receiving this medicine? Side effects that you should report to your doctor or health care professional as soon as possible: -allergic reactions like skin rash, itching or hives, swelling of the face, lips, or tongue -breathing problems -pain or trouble passing urine -redness, blistering, peeling or loosening of the skin, including inside the mouth -severe or watery diarrhea -unusually weak or tired -yellowing of the eyes, skin Side effects that usually do not require medical attention (report to your doctor or health care professional if they continue or are bothersome): -gas or heartburn -genital or anal irritation -headache -joint or muscle pain -nausea, vomiting This list may not describe all possible side effects. Call your doctor for medical advice about side effects. You may report side effects to FDA at 1-800-FDA-1088. Where should I keep my medicine? Keep out of the reach of children. Store at room temperature between 59 and 86 degrees F (15 and 30 degrees C). Throw away any unused medicine after the expiration date. NOTE: This sheet is a summary. It may not cover all possible information. If you have questions about this medicine, talk to your doctor, pharmacist,  or health care provider.  2014, Elsevier/Gold Standard. (2008-03-02 17:09:13)  Potassium Salts tablets, extended-release tablets or capsules What is this medicine? POTASSIUM (poe TASS i um) is a natural salt that is important for the heart, muscles, and nerves. It is found in many foods and is normally supplied by a well balanced diet. This medicine is used to treat low potassium. This medicine may be used for other purposes; ask your health care provider or pharmacist if you have questions. COMMON BRAND NAME(S): ED-K+10, Glu-K, K-10 , K-8, K-Dur, K-Tab, Kaon-CL, Klor-Con M10, Klor-Con M15, Klor-Con M20, Klor-Con, Klotrix, Micro-K  Extencaps, Micro-K, Slow-K What should I tell my health care provider before I take this medicine? They need to know if you have any of these conditions: -dehydration -diabetes -irregular heartbeat -kidney disease -stomach ulcers or other stomach problems -an unusual or allergic reaction to potassium salts, other medicines, foods, dyes, or preservatives -pregnant or trying to get pregnant -breast-feeding How should I use this medicine? Take this medicine by mouth with a full glass of water. Follow the directions on the prescription label. Take with food. Do not suck on, crush, or chew this medicine. If you have difficulty swallowing, ask the pharmacist how to take. Take your medicine at regular intervals. Do not take it more often than directed. Do not stop taking except on your doctor's advice. Talk to your pediatrician regarding the use of this medicine in children. Special care may be needed. Overdosage: If you think you have taken too much of this medicine contact a poison control center or emergency room at once. NOTE: This medicine is only for you. Do not share this medicine with others. What if I miss a dose? If you miss a dose, take it as soon as you can. If it is almost time for your next dose, take only that dose. Do not take double or extra doses. What  may interact with this medicine? Do not take this medicine with any of the following medications: -eplerenone -sodium polystyrene sulfonate This medicine may also interact with the following medications: -medicines for blood pressure or heart disease like lisinopril, losartan, quinapril, valsartan -medicines for cold or allergies -medicines for inflammation like ibuprofen, indomethacin -medicines for Parkinson's disease -medicines for the stomach like metoclopramide, dicyclomine, glycopyrrolate -some diuretics This list may not describe all possible interactions. Give your health care provider a list of all the medicines, herbs, non-prescription drugs, or dietary supplements you use. Also tell them if you smoke, drink alcohol, or use illegal drugs. Some items may interact with your medicine. What should I watch for while using this medicine? Visit your doctor or health care professional for regular check ups. You will need lab work done regularly. You may need to be on a special diet while taking this medicine. Ask your doctor. What side effects may I notice from receiving this medicine? Side effects that you should report to your doctor or health care professional as soon as possible: -allergic reactions like skin rash, itching or hives, swelling of the face, lips, or tongue -black, tarry stools -heartburn -irregular heartbeat -numbness or tingling in hands or feet -pain when swallowing -unusually weak or tired Side effects that usually do not require medical attention (report to your doctor or health care professional if they continue or are bothersome): -diarrhea -nausea -stomach gas -vomiting This list may not describe all possible side effects. Call your doctor for medical advice about side effects. You may report side effects to FDA at 1-800-FDA-1088. Where should I keep my medicine? Keep out of the reach of children. Store at room temperature between 15 and 30 degrees C (59 and  86 degrees F ). Keep bottle closed tightly to protect this medicine from light and moisture. Throw away any unused medicine after the expiration date. NOTE: This sheet is a summary. It may not cover all possible information. If you have questions about this medicine, talk to your doctor, pharmacist, or health care provider.  2014, Elsevier/Gold Standard. (2008-02-12 11:17:31)

## 2014-03-12 LAB — URINE CULTURE
Colony Count: NO GROWTH
Culture: NO GROWTH

## 2014-03-16 ENCOUNTER — Encounter: Payer: Self-pay | Admitting: Nurse Practitioner

## 2014-03-16 ENCOUNTER — Ambulatory Visit (INDEPENDENT_AMBULATORY_CARE_PROVIDER_SITE_OTHER): Payer: Medicare Other | Admitting: Nurse Practitioner

## 2014-03-16 ENCOUNTER — Telehealth: Payer: Self-pay | Admitting: Nurse Practitioner

## 2014-03-16 ENCOUNTER — Ambulatory Visit (INDEPENDENT_AMBULATORY_CARE_PROVIDER_SITE_OTHER): Payer: Medicare Other

## 2014-03-16 VITALS — BP 146/66 | HR 57 | Temp 96.5°F | Ht 64.0 in | Wt 109.0 lb

## 2014-03-16 DIAGNOSIS — J441 Chronic obstructive pulmonary disease with (acute) exacerbation: Secondary | ICD-10-CM

## 2014-03-16 DIAGNOSIS — R0602 Shortness of breath: Secondary | ICD-10-CM

## 2014-03-16 MED ORDER — CIPROFLOXACIN HCL 500 MG PO TABS: 500.0000 mg | ORAL_TABLET | Freq: Two times a day (BID) | ORAL | Status: DC

## 2014-03-16 MED ORDER — BENZONATATE 100 MG PO CAPS: 100.0000 mg | ORAL_CAPSULE | Freq: Two times a day (BID) | ORAL | Status: DC | PRN

## 2014-03-16 NOTE — Patient Instructions (Signed)

## 2014-03-16 NOTE — Telephone Encounter (Signed)
appt at 10:45 with Ronnald Collum

## 2014-03-16 NOTE — Progress Notes (Signed)
   Subjective:    Patient ID: Tiffany Maxwell, female    DOB: 1934-11-06, 80 y.o.   MRN: [id number]  HPI Patient in today c/o cough and congestion- She is on continuous O2 at home and says that she feels SOB. Cough is productive.    Review of Systems  Constitutional: Positive for fatigue. Negative for fever, chills and appetite change.  HENT: Positive for congestion, rhinorrhea and sinus pressure.   Respiratory: Positive for cough (productive).   Cardiovascular: Negative.   Gastrointestinal: Negative.   Genitourinary: Negative.   Musculoskeletal: Negative.   Psychiatric/Behavioral: Negative.   All other systems reviewed and are negative.       Objective:   Physical Exam  Constitutional: She is oriented to person, place, and time. She appears well-developed and well-nourished.  HENT:  Right Ear: Hearing, tympanic membrane, external ear and ear canal normal.  Left Ear: Hearing, tympanic membrane, external ear and ear canal normal.  Nose: Mucosal edema and rhinorrhea present. Right sinus exhibits no maxillary sinus tenderness and no frontal sinus tenderness. Left sinus exhibits no maxillary sinus tenderness and no frontal sinus tenderness.  Mouth/Throat: Uvula is midline, oropharynx is clear and moist and mucous membranes are normal.  Cardiovascular: Normal rate, regular rhythm and normal heart sounds.   Pulmonary/Chest: Effort normal. No respiratory distress. Rales: fine crackles with din=minished breath sounds in lower lobes.  Abdominal: Soft.  Neurological: She is alert and oriented to person, place, and time.  Skin: Skin is warm.  Psychiatric: She has a normal mood and affect. Her behavior is normal. Judgment and thought content normal.   BP 146/66  Pulse 57  Temp(Src) 96.5 F (35.8 C) (Oral)  Chest x-ray- Chronic bronchitic changes-Preliminary reading by Ronnald Collum, FNP  [facility]       Assessment & Plan:   1. SOB (shortness of breath)   2. Obstructive chronic  bronchitis with exacerbation    1. Take meds as prescribed 2. Use a cool mist humidifier especially during the winter months and when heat has been humid. 3. Use saline nose sprays frequently 4. Saline irrigations of the nose can be very helpful if done frequently.  * 4X daily for 1 week*  * Use of a nettie pot can be helpful with this. Follow directions with this* 5. Drink plenty of fluids 6. Keep thermostat turn down low 7.For any cough or congestion  Use plain Mucinex- regular strength or max strength is fine   * Children- consult with Pharmacist for dosing 8. For fever or aces or pains- take tylenol or ibuprofen appropriate for age and weight.  * for fevers greater than 101 orally you may alternate ibuprofen and tylenol every  3 hours.   Meds ordered this encounter  Medications  . ciprofloxacin (CIPRO) 500 MG tablet    Sig: Take 1 tablet (500 mg total) by mouth 2 (two) times daily.    Dispense:  20 tablet    Refill:  0    Order Specific Question:  Supervising Provider    Answer:  Chipper Herb [[id number]]  . benzonatate (TESSALON) 100 MG capsule    Sig: Take 1 capsule (100 mg total) by mouth 2 (two) times daily as needed for cough.    Dispense:  20 capsule    Refill:  0    Order Specific Question:  Supervising Provider    Answer:  Chipper Herb [place], FNP

## 2014-03-26 DIAGNOSIS — H3530 Unspecified macular degeneration: Secondary | ICD-10-CM | POA: Diagnosis not present

## 2014-03-26 DIAGNOSIS — H538 Other visual disturbances: Secondary | ICD-10-CM | POA: Diagnosis not present

## 2014-03-26 DIAGNOSIS — Z961 Presence of intraocular lens: Secondary | ICD-10-CM | POA: Diagnosis not present

## 2014-03-31 DIAGNOSIS — M25559 Pain in unspecified hip: Secondary | ICD-10-CM | POA: Diagnosis not present

## 2014-03-31 DIAGNOSIS — I10 Essential (primary) hypertension: Secondary | ICD-10-CM | POA: Diagnosis not present

## 2014-04-07 ENCOUNTER — Observation Stay (HOSPITAL_COMMUNITY)
Admission: EM | Admit: 2014-04-07 | Discharge: 2014-04-09 | Disposition: A | Discharge status: Home / Self Care | Payer: Medicare Other | Attending: Internal Medicine | Admitting: Internal Medicine

## 2014-04-07 ENCOUNTER — Encounter (HOSPITAL_COMMUNITY): Payer: Self-pay | Admitting: Emergency Medicine

## 2014-04-07 ENCOUNTER — Emergency Department (HOSPITAL_COMMUNITY): Payer: Medicare Other

## 2014-04-07 DIAGNOSIS — J209 Acute bronchitis, unspecified: Secondary | ICD-10-CM

## 2014-04-07 DIAGNOSIS — E871 Hypo-osmolality and hyponatremia: Secondary | ICD-10-CM | POA: Diagnosis not present

## 2014-04-07 DIAGNOSIS — R011 Cardiac murmur, unspecified: Secondary | ICD-10-CM | POA: Insufficient documentation

## 2014-04-07 DIAGNOSIS — M810 Age-related osteoporosis without current pathological fracture: Secondary | ICD-10-CM | POA: Diagnosis present

## 2014-04-07 DIAGNOSIS — J449 Chronic obstructive pulmonary disease, unspecified: Secondary | ICD-10-CM

## 2014-04-07 DIAGNOSIS — I2510 Atherosclerotic heart disease of native coronary artery without angina pectoris: Secondary | ICD-10-CM | POA: Insufficient documentation

## 2014-04-07 DIAGNOSIS — I252 Old myocardial infarction: Secondary | ICD-10-CM | POA: Diagnosis not present

## 2014-04-07 DIAGNOSIS — I491 Atrial premature depolarization: Secondary | ICD-10-CM | POA: Insufficient documentation

## 2014-04-07 DIAGNOSIS — Z87891 Personal history of nicotine dependence: Secondary | ICD-10-CM

## 2014-04-07 DIAGNOSIS — K8020 Calculus of gallbladder without cholecystitis without obstruction: Secondary | ICD-10-CM

## 2014-04-07 DIAGNOSIS — M4850XA Collapsed vertebra, not elsewhere classified, site unspecified, initial encounter for fracture: Secondary | ICD-10-CM

## 2014-04-07 DIAGNOSIS — Z9981 Dependence on supplemental oxygen: Secondary | ICD-10-CM | POA: Diagnosis not present

## 2014-04-07 DIAGNOSIS — D72829 Elevated white blood cell count, unspecified: Secondary | ICD-10-CM

## 2014-04-07 DIAGNOSIS — J9610 Chronic respiratory failure, unspecified whether with hypoxia or hypercapnia: Secondary | ICD-10-CM | POA: Insufficient documentation

## 2014-04-07 DIAGNOSIS — I10 Essential (primary) hypertension: Secondary | ICD-10-CM | POA: Insufficient documentation

## 2014-04-07 DIAGNOSIS — M545 Low back pain, unspecified: Secondary | ICD-10-CM | POA: Diagnosis not present

## 2014-04-07 DIAGNOSIS — M8448XA Pathological fracture, other site, initial encounter for fracture: Secondary | ICD-10-CM | POA: Diagnosis not present

## 2014-04-07 DIAGNOSIS — J438 Other emphysema: Secondary | ICD-10-CM | POA: Insufficient documentation

## 2014-04-07 DIAGNOSIS — E876 Hypokalemia: Secondary | ICD-10-CM

## 2014-04-07 DIAGNOSIS — E785 Hyperlipidemia, unspecified: Secondary | ICD-10-CM | POA: Diagnosis not present

## 2014-04-07 DIAGNOSIS — M549 Dorsalgia, unspecified: Secondary | ICD-10-CM | POA: Diagnosis present

## 2014-04-07 DIAGNOSIS — D62 Acute posthemorrhagic anemia: Secondary | ICD-10-CM

## 2014-04-07 DIAGNOSIS — I5189 Other ill-defined heart diseases: Secondary | ICD-10-CM

## 2014-04-07 DIAGNOSIS — S32009A Unspecified fracture of unspecified lumbar vertebra, initial encounter for closed fracture: Secondary | ICD-10-CM | POA: Diagnosis not present

## 2014-04-07 DIAGNOSIS — M546 Pain in thoracic spine: Secondary | ICD-10-CM | POA: Diagnosis not present

## 2014-04-07 DIAGNOSIS — IMO0002 Reserved for concepts with insufficient information to code with codable children: Secondary | ICD-10-CM

## 2014-04-07 DIAGNOSIS — J439 Emphysema, unspecified: Secondary | ICD-10-CM

## 2014-04-07 DIAGNOSIS — R0902 Hypoxemia: Secondary | ICD-10-CM

## 2014-04-07 DIAGNOSIS — S22009A Unspecified fracture of unspecified thoracic vertebra, initial encounter for closed fracture: Secondary | ICD-10-CM | POA: Diagnosis not present

## 2014-04-07 DIAGNOSIS — S72009A Fracture of unspecified part of neck of unspecified femur, initial encounter for closed fracture: Secondary | ICD-10-CM

## 2014-04-07 DIAGNOSIS — I519 Heart disease, unspecified: Secondary | ICD-10-CM | POA: Diagnosis not present

## 2014-04-07 DIAGNOSIS — R21 Rash and other nonspecific skin eruption: Secondary | ICD-10-CM

## 2014-04-07 DIAGNOSIS — M4856XA Collapsed vertebra, not elsewhere classified, lumbar region, initial encounter for fracture: Secondary | ICD-10-CM | POA: Diagnosis present

## 2014-04-07 HISTORY — DX: Collapsed vertebra, not elsewhere classified, lumbar region, initial encounter for fracture: M48.56XA

## 2014-04-07 LAB — CBC WITH DIFFERENTIAL/PLATELET
Basophils Absolute: 0 10*3/uL (ref 0.0–0.1)
Basophils Relative: 0 % (ref 0–1)
EOS ABS: 0.2 10*3/uL (ref 0.0–0.7)
Eosinophils Relative: 4 % (ref 0–5)
HCT: 33.8 % — ABNORMAL LOW (ref 36.0–46.0)
Hemoglobin: 11.6 g/dL — ABNORMAL LOW (ref 12.0–15.0)
LYMPHS ABS: 0.9 10*3/uL (ref 0.7–4.0)
LYMPHS PCT: 17 % (ref 12–46)
MCH: 28.6 pg (ref 26.0–34.0)
MCHC: 34.3 g/dL (ref 30.0–36.0)
MCV: 83.3 fL (ref 78.0–100.0)
Monocytes Absolute: 0.7 10*3/uL (ref 0.1–1.0)
Monocytes Relative: 13 % — ABNORMAL HIGH (ref 3–12)
Neutro Abs: 3.3 10*3/uL (ref 1.7–7.7)
Neutrophils Relative %: 66 % (ref 43–77)
PLATELETS: 189 10*3/uL (ref 150–400)
RBC: 4.06 MIL/uL (ref 3.87–5.11)
RDW: 12.2 % (ref 11.5–15.5)
WBC: 5.1 10*3/uL (ref 4.0–10.5)

## 2014-04-07 LAB — URINALYSIS, ROUTINE W REFLEX MICROSCOPIC
Bilirubin Urine: NEGATIVE
Glucose, UA: NEGATIVE mg/dL
Hgb urine dipstick: NEGATIVE
Ketones, ur: NEGATIVE mg/dL
Leukocytes, UA: NEGATIVE
NITRITE: NEGATIVE
PH: 6 (ref 5.0–8.0)
Protein, ur: NEGATIVE mg/dL
Specific Gravity, Urine: 1.01 (ref 1.005–1.030)
UROBILINOGEN UA: 0.2 mg/dL (ref 0.0–1.0)

## 2014-04-07 LAB — BASIC METABOLIC PANEL
BUN: 8 mg/dL (ref 6–23)
CO2: 31 mEq/L (ref 19–32)
Calcium: 9.3 mg/dL (ref 8.4–10.5)
Chloride: 88 mEq/L — ABNORMAL LOW (ref 96–112)
Creatinine, Ser: 0.51 mg/dL (ref 0.50–1.10)
GFR calc Af Amer: >90 mL/min (ref >90)
Glucose, Bld: 101 mg/dL — ABNORMAL HIGH (ref 70–99)
POTASSIUM: 4.1 meq/L (ref 3.7–5.3)
SODIUM: 127 meq/L — AB (ref 137–147)

## 2014-04-07 MED ORDER — SODIUM CHLORIDE 0.9 % IV BOLUS (SEPSIS)
500.0000 mL | Freq: Once | INTRAVENOUS | Status: AC
  Administered 2014-04-07: 500 mL via INTRAVENOUS

## 2014-04-07 MED ORDER — SODIUM CHLORIDE 0.9 % IJ SOLN
INTRAMUSCULAR | Status: AC
  Administered 2014-04-07: 3 mL
  Filled 2014-04-07: qty 6

## 2014-04-07 MED ORDER — MORPHINE SULFATE 2 MG/ML IJ SOLN
2.0000 mg | INTRAMUSCULAR | Status: DC | PRN
  Administered 2014-04-07: 2 mg via INTRAVENOUS
  Filled 2014-04-07: qty 1

## 2014-04-07 NOTE — ED Notes (Signed)
Per EMS, patient c/o lower back pain x 1 week.  Denies fall or any injury.

## 2014-04-07 NOTE — ED Provider Notes (Signed)
CSN: [id number]     Arrival date & time 04/07/14  2106 History  This chart was scribed for Kathalene Frames, MD by Rolanda Lundborg, ED Scribe. This patient was seen in room APA06/[facility]06 and the patient's care was started at 9:36 PM.    Chief Complaint  Patient presents with  . Back Pain   The history is provided by the patient. No language interpreter was used.   HPI Comments: Tiffany Maxwell is a 80 y.o. female who presents to the Emergency Department complaining of constant bilateral lower back pain onset 1 week ago that worsened this evening. She denies injuries or falls. She was treated for a UTI 4 weeks ago. She has not taken anything for the pain. She denies fever, vomiting, diarrhea, numbness or weakness.   Past Medical History  Diagnosis Date  . Osteoporosis   . Palpitations   . PAC (premature atrial contraction)   . Seborrheic keratosis   . Hypertension   . Hyperlipidemia   . COPD (chronic obstructive pulmonary disease)   . MI (myocardial infarction)   . Multiple fractures of thoracic spine, closed 01/30/2013  . Cholelithiasis 01/30/2013  . Pulmonary emphysema 01/30/2013  . History of tobacco use 01/30/2013  . Diastolic dysfunction 0/93/8182    Grade 1. Ejection fraction 65%.   Past Surgical History  Procedure Laterality Date  . Tubal ligation    . Compression hip screw Left 04/29/2013    Procedure: COMPRESSION HIP;  Surgeon: Sanjuana Kava, MD;  Location: [facility];  Service: Orthopedics;  Laterality: Left;  [place]   No family history on file. History  Substance Use Topics  . Smoking status: Former Smoker -- 1.00 packs/day for 40 years    Types: Cigarettes    Quit date: 07/10/2012  . Smokeless tobacco: Never Used  . Alcohol Use: No   OB History   Grav Para Term Preterm Abortions TAB SAB Ect Mult Living                 Review of Systems  Constitutional: Negative for fever.  Gastrointestinal: Negative for vomiting and diarrhea.  Musculoskeletal: Positive for back  pain.  Neurological: Negative for weakness and numbness.   A complete 10 system review of systems was obtained and all systems are negative except as noted in the HPI and PMH.     Allergies  Codeine; Benicar hct; and Naproxen  Home Medications   Prior to Admission medications   Medication Sig Start Date End Date Taking? Authorizing Provider  albuterol-ipratropium (COMBIVENT) 18-103 MCG/ACT inhaler Inhale 2 puffs into the lungs every 6 (six) hours as needed for wheezing. 10/13/13   Mary-Margaret Hassell Done, FNP  ALPRAZolam Duanne Moron) 0.25 MG tablet TAKE 1 TABLET AT BEDTIME AS NEEDED FOR SLEEP 12/31/13   Mary-Margaret Hassell Done, FNP  aspirin EC 81 MG tablet Take 81 mg by mouth daily.    Historical Provider, MD  benzonatate (TESSALON) 100 MG capsule Take 1 capsule (100 mg total) by mouth 2 (two) times daily as needed for cough. 03/16/14   Mary-Margaret Hassell Done, FNP  Calcium Carbonate-Vitamin D (CALCIUM-VITAMIN D) 500-200 MG-UNIT per tablet Take 1 tablet by mouth daily. 01/30/13   Rexene Alberts, MD  cephALEXin (KEFLEX) 500 MG capsule Take 1 capsule (500 mg total) by mouth 4 (four) times daily. 08/19/36   [name], MD  ciprofloxacin (CIPRO) 500 MG tablet Take 1 tablet (500 mg total) by mouth 2 (two) times daily. 03/16/14   Mary-Margaret Hassell Done, FNP  citalopram (CELEXA) 20 MG  tablet Take 1 tablet (20 mg total) by mouth daily. 10/13/13   Mary-Margaret Hassell Done, FNP  ferrous sulfate (FERROUSUL) 325 (65 FE) MG tablet Take 1 tablet (325 mg total) by mouth daily with breakfast. 10/13/13   Mary-Margaret Hassell Done, FNP  furosemide (LASIX) 20 MG tablet Take 1 tablet (20 mg total) by mouth daily as needed (Fluid). 10/13/13   Mary-Margaret Hassell Done, FNP  lisinopril (PRINIVIL,ZESTRIL) 40 MG tablet Take 1 tablet (40 mg total) by mouth daily. 10/13/13   Mary-Margaret Hassell Done, FNP  oxyCODONE-acetaminophen (PERCOCET/ROXICET) 5-325 MG per tablet Take 1-2 tablets by mouth every 4 (four) hours as needed. 05/06/13   Pricilla Larsson, NP   polyethylene glycol (MIRALAX / GLYCOLAX) packet Take 17 g by mouth daily. 06/10/13   Mary-Margaret Hassell Done, FNP  potassium chloride SA (K-DUR,KLOR-CON) 20 MEQ tablet Take 1 tablet (20 mEq total) by mouth daily. 12/13/06   [name], MD   BP 65/78  Pulse 105  Temp(Src) 98.6 F (37 C) (Oral)  Resp 18  Ht 5\' 1"  (1.549 m)  Wt 109 lb (49.442 kg)  BMI 20.61 kg/m2  SpO2 95% Physical Exam  Nursing note and vitals reviewed. Constitutional: She appears well-developed and well-nourished. No distress.  elderly  HENT:  Head: Normocephalic and atraumatic.  Right Ear: External ear normal.  Left Ear: External ear normal.  Eyes: Conjunctivae are normal. Right eye exhibits no discharge. Left eye exhibits no discharge. No scleral icterus.  Neck: Neck supple. No tracheal deviation present.  Cardiovascular: Normal rate, regular rhythm and intact distal pulses.   Pulmonary/Chest: Effort normal and breath sounds normal. No stridor. No respiratory distress. She has no wheezes. She has no rales.  Abdominal: Soft. Bowel sounds are normal. She exhibits no distension. There is no rebound and no guarding.  Musculoskeletal: She exhibits tenderness. She exhibits no edema.  Mild tenderness at the paraspinal and inferior bilateral costovertebral angle  Neurological: She is alert. She has normal strength. No cranial nerve deficit (no facial droop, extraocular movements intact, no slurred speech) or sensory deficit. She exhibits normal muscle tone. She displays no seizure activity. Coordination normal.  Skin: Skin is warm and dry. No rash noted.  Psychiatric: She has a normal mood and affect.    ED Course  Procedures (including critical care time) Medications  morphine 2 MG/ML injection 2 mg (2 mg Intravenous Given 04/07/14 2213)  sodium chloride 0.9 % injection (3 mLs  Given 04/07/14 2212)  sodium chloride 0.9 % bolus 500 mL (500 mLs Intravenous New Bag/Given 04/07/14 2322)    DIAGNOSTIC STUDIES: Oxygen  Saturation is 95% on [place], normal by my interpretation.    COORDINATION OF CARE: 9:41 PM- Discussed treatment plan with pt. Pt agrees to plan.    Labs Review Labs Reviewed  CBC WITH DIFFERENTIAL - Abnormal; Notable for the following:    Hemoglobin 11.6 (*)    HCT 33.8 (*)    Monocytes Relative 13 (*)    All other components within normal limits  BASIC METABOLIC PANEL - Abnormal; Notable for the following:    Sodium 127 (*)    Chloride 88 (*)    Glucose, Bld 101 (*)    All other components within normal limits  URINALYSIS, ROUTINE W REFLEX MICROSCOPIC    Imaging Review Dg Lumbar Spine Complete  04/07/2014   CLINICAL DATA:  Low back pain for 1 week, no injury.  EXAM: LUMBAR SPINE - COMPLETE 4+ VIEW  COMPARISON:  DG CHEST 2 VIEW dated 01/28/2013  FINDINGS: Patient is osteopenic  limiting evaluation. Moderate to severe T12 and L1 compression fractures, mild to moderate L2, Mild L 3 and L4 compression fractures. No malalignment. Mild scoliosis. No destructive bony lesions. Moderate aortoiliac vascular calcifications. Left femoral neck pinning. Subcentimeter calcification projects the right abdomen, and could reflect nephrolithiasis.  IMPRESSION: Osteopenia with T12 through L4 compression fractures, including moderate to severe T12 and L1 compression fractures. These are age indeterminate, and if clinically indicated MRI with STIR sequences would be more sensitive.  No malalignment.  Possible right nephrolithiasis.   Electronically Signed   By: Elon Alas   On: 04/07/2014 23:02      MDM   Final diagnoses:  Compression fracture of spine  Hyponatremia   Discussed findings with patient and family.  Compression fractures are likely the cause of her lower back pain. Incidental Hyponatremia and hypochloremia also noted.  Pt given pain meds in the ED.  Attempted to ambulate.  Unable to do so.  Discussed with family.  Will admit for pain management.  May need rehab  I personally performed  the services described in this documentation, which was scribed in my presence.  The recorded information has been reviewed and is accurate.   Kathalene Frames, MD 04/08/14 [phone]

## 2014-04-07 NOTE — ED Notes (Signed)
Patient assisted out of bed for trial ambulation.  Patient ambulated several feet with 2 person assist.  Patient continues to c/o back pain.

## 2014-04-08 ENCOUNTER — Observation Stay (HOSPITAL_COMMUNITY): Payer: Medicare Other

## 2014-04-08 ENCOUNTER — Encounter (HOSPITAL_COMMUNITY): Payer: Self-pay | Admitting: Internal Medicine

## 2014-04-08 DIAGNOSIS — I10 Essential (primary) hypertension: Secondary | ICD-10-CM

## 2014-04-08 DIAGNOSIS — M549 Dorsalgia, unspecified: Secondary | ICD-10-CM | POA: Diagnosis present

## 2014-04-08 DIAGNOSIS — J449 Chronic obstructive pulmonary disease, unspecified: Secondary | ICD-10-CM | POA: Diagnosis not present

## 2014-04-08 DIAGNOSIS — T148XXA Other injury of unspecified body region, initial encounter: Secondary | ICD-10-CM

## 2014-04-08 DIAGNOSIS — E871 Hypo-osmolality and hyponatremia: Secondary | ICD-10-CM | POA: Diagnosis not present

## 2014-04-08 DIAGNOSIS — S22009A Unspecified fracture of unspecified thoracic vertebra, initial encounter for closed fracture: Secondary | ICD-10-CM

## 2014-04-08 DIAGNOSIS — IMO0002 Reserved for concepts with insufficient information to code with codable children: Secondary | ICD-10-CM

## 2014-04-08 LAB — SODIUM, URINE, RANDOM: SODIUM UR: 20 meq/L

## 2014-04-08 LAB — CBC
HCT: 32.5 % — ABNORMAL LOW (ref 36.0–46.0)
HEMOGLOBIN: 10.9 g/dL — AB (ref 12.0–15.0)
MCH: 28.2 pg (ref 26.0–34.0)
MCHC: 33.5 g/dL (ref 30.0–36.0)
MCV: 84 fL (ref 78.0–100.0)
Platelets: 205 10*3/uL (ref 150–400)
RBC: 3.87 MIL/uL (ref 3.87–5.11)
RDW: 12.4 % (ref 11.5–15.5)
WBC: 4.1 10*3/uL (ref 4.0–10.5)

## 2014-04-08 LAB — OSMOLALITY: Osmolality: 272 mOsm/kg — ABNORMAL LOW (ref 275–300)

## 2014-04-08 LAB — CORTISOL: CORTISOL PLASMA: 14.4 ug/dL

## 2014-04-08 LAB — OSMOLALITY, URINE: Osmolality, Ur: 165 mOsm/kg — ABNORMAL LOW (ref 390–1090)

## 2014-04-08 LAB — TSH: TSH: 0.275 u[IU]/mL — AB (ref 0.350–4.500)

## 2014-04-08 MED ORDER — LISINOPRIL 10 MG PO TABS
40.0000 mg | ORAL_TABLET | Freq: Every day | ORAL | Status: DC
  Administered 2014-04-08 – 2014-04-09 (×2): 40 mg via ORAL
  Filled 2014-04-08 (×2): qty 4

## 2014-04-08 MED ORDER — SODIUM CHLORIDE 0.9 % IV SOLN
INTRAVENOUS | Status: AC
  Administered 2014-04-08 (×2): via INTRAVENOUS

## 2014-04-08 MED ORDER — IPRATROPIUM-ALBUTEROL 0.5-2.5 (3) MG/3ML IN SOLN
3.0000 mL | Freq: Three times a day (TID) | RESPIRATORY_TRACT | Status: DC
  Administered 2014-04-08 – 2014-04-09 (×5): 3 mL via RESPIRATORY_TRACT
  Filled 2014-04-08 (×5): qty 3

## 2014-04-08 MED ORDER — ENOXAPARIN SODIUM 30 MG/0.3ML ~~LOC~~ SOLN
30.0000 mg | SUBCUTANEOUS | Status: DC
  Administered 2014-04-08 – 2014-04-09 (×2): 30 mg via SUBCUTANEOUS
  Filled 2014-04-08 (×2): qty 0.3

## 2014-04-08 MED ORDER — IPRATROPIUM-ALBUTEROL 0.5-2.5 (3) MG/3ML IN SOLN: 3.0000 mL | Freq: Four times a day (QID) | RESPIRATORY_TRACT | Status: DC | PRN

## 2014-04-08 MED ORDER — ACETAMINOPHEN 650 MG RE SUPP: 650.0000 mg | Freq: Four times a day (QID) | RECTAL | Status: DC | PRN

## 2014-04-08 MED ORDER — HYDROMORPHONE HCL PF 1 MG/ML IJ SOLN
0.5000 mg | INTRAMUSCULAR | Status: DC | PRN
  Administered 2014-04-08 – 2014-04-09 (×3): 0.5 mg via INTRAVENOUS
  Filled 2014-04-08 (×3): qty 1

## 2014-04-08 MED ORDER — SODIUM CHLORIDE 0.9 % IJ SOLN: 3.0000 mL | INTRAMUSCULAR | Status: DC | PRN

## 2014-04-08 MED ORDER — PREDNISONE 10 MG PO TABS
15.0000 mg | ORAL_TABLET | Freq: Two times a day (BID) | ORAL | Status: DC
  Administered 2014-04-08 – 2014-04-09 (×2): 15 mg via ORAL
  Filled 2014-04-08 (×2): qty 2

## 2014-04-08 MED ORDER — FUROSEMIDE 20 MG PO TABS
20.0000 mg | ORAL_TABLET | Freq: Every day | ORAL | Status: DC
  Administered 2014-04-08 – 2014-04-09 (×2): 20 mg via ORAL
  Filled 2014-04-08 (×2): qty 1

## 2014-04-08 MED ORDER — POLYETHYLENE GLYCOL 3350 17 G PO PACK
17.0000 g | PACK | Freq: Every day | ORAL | Status: DC
  Administered 2014-04-09: 17 g via ORAL
  Filled 2014-04-08 (×2): qty 1

## 2014-04-08 MED ORDER — ASPIRIN EC 81 MG PO TBEC
81.0000 mg | DELAYED_RELEASE_TABLET | Freq: Every day | ORAL | Status: DC
  Administered 2014-04-08 – 2014-04-09 (×2): 81 mg via ORAL
  Filled 2014-04-08 (×2): qty 1

## 2014-04-08 MED ORDER — POTASSIUM CHLORIDE CRYS ER 20 MEQ PO TBCR
20.0000 meq | EXTENDED_RELEASE_TABLET | Freq: Every day | ORAL | Status: DC
  Administered 2014-04-08 – 2014-04-09 (×2): 20 meq via ORAL
  Filled 2014-04-08 (×2): qty 1

## 2014-04-08 MED ORDER — CALCIUM CARBONATE-VITAMIN D 500-200 MG-UNIT PO TABS
1.0000 | ORAL_TABLET | Freq: Every day | ORAL | Status: DC
  Administered 2014-04-08 – 2014-04-09 (×2): 1 via ORAL
  Filled 2014-04-08 (×2): qty 1

## 2014-04-08 MED ORDER — SODIUM CHLORIDE 0.9 % IJ SOLN
3.0000 mL | Freq: Two times a day (BID) | INTRAMUSCULAR | Status: DC
  Administered 2014-04-08 – 2014-04-09 (×4): 3 mL via INTRAVENOUS

## 2014-04-08 MED ORDER — BENZONATATE 100 MG PO CAPS: 100.0000 mg | ORAL_CAPSULE | Freq: Two times a day (BID) | ORAL | Status: DC | PRN

## 2014-04-08 MED ORDER — ACETAMINOPHEN 325 MG PO TABS: 650.0000 mg | ORAL_TABLET | Freq: Four times a day (QID) | ORAL | Status: DC | PRN

## 2014-04-08 MED ORDER — SODIUM CHLORIDE 0.9 % IV SOLN: 250.0000 mL | INTRAVENOUS | Status: DC | PRN

## 2014-04-08 MED ORDER — ENOXAPARIN SODIUM 40 MG/0.4ML ~~LOC~~ SOLN: 40.0000 mg | SUBCUTANEOUS | Status: DC

## 2014-04-08 NOTE — Progress Notes (Signed)
UR Completed.  Taler Kushner Jane Eilidh Marcano [phone] 04/08/2014  

## 2014-04-08 NOTE — H&P (Signed)
Tiffany Maxwell is an 80 y.o. female.    Pcp: [name] (? NP for Dr. Laurance Flatten)  Chief Complaint: back pain HPI: 80 yo female with osteoporosis, hx of prior compression fracture apparently complains of back pain worse than normal today.  No radiation of the pain.  Denies numbness, tingling , weakness, incontinence.  Pt was brought to the ED by per family. And xray showed indeterminate age compression fracture, and hyponatremia.   Past Medical History  Diagnosis Date  . Osteoporosis   . Palpitations   . PAC (premature atrial contraction)   . Seborrheic keratosis   . Hypertension   . Hyperlipidemia   . COPD (chronic obstructive pulmonary disease)   . MI (myocardial infarction)     pt unaware  . Multiple fractures of thoracic spine, closed 01/30/2013  . Cholelithiasis 01/30/2013  . Pulmonary emphysema 01/30/2013  . History of tobacco use 01/30/2013  . Diastolic dysfunction 4/35/6861    Grade 1. Ejection fraction 65%.    Past Surgical History  Procedure Laterality Date  . Tubal ligation    . Compression hip screw Left 04/29/2013    Procedure: COMPRESSION HIP;  Surgeon: Sanjuana Kava, MD;  Location: [facility];  Service: Orthopedics;  Laterality: Left;  [place]    Family History  Problem Relation Age of Onset  . Hypertension Mother    Social History:  reports that she quit smoking about 20 months ago. Her smoking use included Cigarettes. She has a 40 pack-year smoking history. She has never used smokeless tobacco. She reports that she does not drink alcohol or use illicit drugs.  Allergies:  Allergies  Allergen Reactions  . Codeine Other (See Comments)    headache  . Benicar Hct [Olmesartan Medoxomil-Hctz] Swelling and Rash  . Naproxen Itching, Rash and Other (See Comments)    Redness/flushing of skin     (Not in a hospital admission)  Results for orders placed during the hospital encounter of 04/07/14 (from the past 48 hour(s))  CBC WITH DIFFERENTIAL      Status: Abnormal   Collection Time    04/07/14 10:00 PM      Result Value Ref Range   WBC 5.1  4.0 - 10.5 K/uL   RBC 4.06  3.87 - 5.11 MIL/uL   Hemoglobin 11.6 (*) 12.0 - 15.0 g/dL   HCT 33.8 (*) 36.0 - 46.0 %   MCV 83.3  78.0 - 100.0 fL   MCH 28.6  26.0 - 34.0 pg   MCHC 34.3  30.0 - 36.0 g/dL   RDW 12.2  11.5 - 15.5 %   Platelets 189  150 - 400 K/uL   Neutrophils Relative % 66  43 - 77 %   Neutro Abs 3.3  1.7 - 7.7 K/uL   Lymphocytes Relative 17  12 - 46 %   Lymphs Abs 0.9  0.7 - 4.0 K/uL   Monocytes Relative 13 (*) 3 - 12 %   Monocytes Absolute 0.7  0.1 - 1.0 K/uL   Eosinophils Relative 4  0 - 5 %   Eosinophils Absolute 0.2  0.0 - 0.7 K/uL   Basophils Relative 0  0 - 1 %   Basophils Absolute 0.0  0.0 - 0.1 K/uL  BASIC METABOLIC PANEL     Status: Abnormal   Collection Time    04/07/14 10:00 PM      Result Value Ref Range   Sodium 127 (*) 137 - 147 mEq/L   Potassium 4.1  3.7 -  5.3 mEq/L   Chloride 88 (*) 96 - 112 mEq/L   CO2 31  19 - 32 mEq/L   Glucose, Bld 101 (*) 70 - 99 mg/dL   BUN 8  6 - 23 mg/dL   Creatinine, Ser 0.51  0.50 - 1.10 mg/dL   Calcium 9.3  8.4 - 10.5 mg/dL   GFR calc non Af Amer >90  >90 mL/min   GFR calc Af Amer >90  >90 mL/min   Comment: (NOTE)     The eGFR has been calculated using the CKD EPI equation.     This calculation has not been validated in all clinical situations.     eGFR's persistently <90 mL/min signify possible Chronic Kidney     Disease.  URINALYSIS, ROUTINE W REFLEX MICROSCOPIC     Status: None   Collection Time    04/07/14 11:03 PM      Result Value Ref Range   Color, Urine YELLOW  YELLOW   APPearance CLEAR  CLEAR   Specific Gravity, Urine 1.010  1.005 - 1.030   pH 6.0  5.0 - 8.0   Glucose, UA NEGATIVE  NEGATIVE mg/dL   Hgb urine dipstick NEGATIVE  NEGATIVE   Bilirubin Urine NEGATIVE  NEGATIVE   Ketones, ur NEGATIVE  NEGATIVE mg/dL   Protein, ur NEGATIVE  NEGATIVE mg/dL   Urobilinogen, UA 0.2  0.0 - 1.0 mg/dL   Nitrite  NEGATIVE  NEGATIVE   Leukocytes, UA NEGATIVE  NEGATIVE   Comment: MICROSCOPIC NOT DONE ON URINES WITH NEGATIVE PROTEIN, BLOOD, LEUKOCYTES, NITRITE, OR GLUCOSE <1000 mg/dL.   Dg Lumbar Spine Complete  04/07/2014   CLINICAL DATA:  Low back pain for 1 week, no injury.  EXAM: LUMBAR SPINE - COMPLETE 4+ VIEW  COMPARISON:  DG CHEST 2 VIEW dated 01/28/2013  FINDINGS: Patient is osteopenic limiting evaluation. Moderate to severe T12 and L1 compression fractures, mild to moderate L2, Mild L 3 and L4 compression fractures. No malalignment. Mild scoliosis. No destructive bony lesions. Moderate aortoiliac vascular calcifications. Left femoral neck pinning. Subcentimeter calcification projects the right abdomen, and could reflect nephrolithiasis.  IMPRESSION: Osteopenia with T12 through L4 compression fractures, including moderate to severe T12 and L1 compression fractures. These are age indeterminate, and if clinically indicated MRI with STIR sequences would be more sensitive.  No malalignment.  Possible right nephrolithiasis.   Electronically Signed   By: Elon Alas   On: 04/07/2014 23:02    ROS negative for all organ systems except for + above  Blood pressure 90/72, pulse 105, temperature 98.6 F (37 C), temperature source Oral, resp. rate 18, height 5' 1"  (1.549 m), weight 109 lb (49.442 kg), SpO2 95.00%. Physical Exam   Heent: anicteric Neck: no jvd, no bruit, no tm,  Heart: rrr s1, s2 Lung: prolonged exp phase, no wheeze, no crackles Abd: soft, nt, nd, +bs Ext: no c/c/e Skin: no rash Lymph: no adenopathy Neuro: nonfocal  Assessment/Plan Back pain: compression fracture Check MRI L spine T spine To determine age of compression fracture to see if a candidate for kyphoplasty or vertebroplasty  Osteoporosis Check tsh, check vitamin D 25-oh  Copd: cont current breathing treatments  Hyponatremia: ? Due to pain or lasix,  Hydrate gently with normal saline Check cortisol, tsh, osm, Una, U  osm  Hypertension: cont lisinopril,  May need to decrease lisinopril  Anemia: repeat cbc  DVT prophylaxis: lovenox    Jani Gravel 04/08/2014, 1:00 AM

## 2014-04-08 NOTE — Progress Notes (Signed)
The patient is a 80 year old woman with a history of multiple compression fractures of her thoracic spine and 2014, COPD, and hypertension, who was admitted by Dr. Maudie Mercury this morning. The patient was briefly seen. Her chart, vital signs, laboratory studies were reviewed. The patient refuses MRI of her thoracic and lumbar spine due to her inability to lay flat for more than a few minutes and claustrophobia. She was offered a sedative, but she refused. Therefore, we will continue to treat her pain with analgesics as needed. We'll add a short course of prednisone. Will discuss further with interventional radiology to see if there is a mother radiographic study that they would prefer for potential consideration of kyphoplasty. We'll order PT evaluation. The patient is noted to be hyponatremic. We'll continue normal saline infusion as ordered by Dr. Maudie Mercury. We'll followup on the studies ordered by him. We'll also order a vitamin B12 level and total iron for evaluation of anemia.

## 2014-04-09 ENCOUNTER — Encounter (HOSPITAL_COMMUNITY): Payer: Self-pay | Admitting: Internal Medicine

## 2014-04-09 DIAGNOSIS — E871 Hypo-osmolality and hyponatremia: Secondary | ICD-10-CM | POA: Diagnosis not present

## 2014-04-09 DIAGNOSIS — M549 Dorsalgia, unspecified: Secondary | ICD-10-CM

## 2014-04-09 DIAGNOSIS — M4856XA Collapsed vertebra, not elsewhere classified, lumbar region, initial encounter for fracture: Secondary | ICD-10-CM

## 2014-04-09 DIAGNOSIS — J449 Chronic obstructive pulmonary disease, unspecified: Secondary | ICD-10-CM | POA: Diagnosis not present

## 2014-04-09 DIAGNOSIS — IMO0002 Reserved for concepts with insufficient information to code with codable children: Secondary | ICD-10-CM | POA: Diagnosis not present

## 2014-04-09 HISTORY — DX: Collapsed vertebra, not elsewhere classified, lumbar region, initial encounter for fracture: M48.56XA

## 2014-04-09 LAB — CBC
HCT: 33 % — ABNORMAL LOW (ref 36.0–46.0)
Hemoglobin: 11.1 g/dL — ABNORMAL LOW (ref 12.0–15.0)
MCH: 28.4 pg (ref 26.0–34.0)
MCHC: 33.6 g/dL (ref 30.0–36.0)
MCV: 84.4 fL (ref 78.0–100.0)
PLATELETS: 213 10*3/uL (ref 150–400)
RBC: 3.91 MIL/uL (ref 3.87–5.11)
RDW: 12.3 % (ref 11.5–15.5)
WBC: 2.7 10*3/uL — ABNORMAL LOW (ref 4.0–10.5)

## 2014-04-09 LAB — COMPREHENSIVE METABOLIC PANEL
ALBUMIN: 2.7 g/dL — AB (ref 3.5–5.2)
AST: 18 U/L (ref 0–37)
Alkaline Phosphatase: 75 U/L (ref 39–117)
BUN: 6 mg/dL (ref 6–23)
CALCIUM: 9.3 mg/dL (ref 8.4–10.5)
CO2: 29 meq/L (ref 19–32)
CREATININE: 0.43 mg/dL — AB (ref 0.50–1.10)
Chloride: 97 mEq/L (ref 96–112)
GFR calc Af Amer: >90 mL/min (ref >90)
GFR calc non Af Amer: >90 mL/min (ref >90)
Glucose, Bld: 101 mg/dL — ABNORMAL HIGH (ref 70–99)
Potassium: 4.3 mEq/L (ref 3.7–5.3)
SODIUM: 134 meq/L — AB (ref 137–147)
TOTAL PROTEIN: 5.8 g/dL — AB (ref 6.0–8.3)
Total Bilirubin: 0.3 mg/dL (ref 0.3–1.2)

## 2014-04-09 LAB — IRON AND TIBC
IRON: 40 ug/dL — AB (ref 42–135)
SATURATION RATIOS: 15 % — AB (ref 20–55)
TIBC: 272 ug/dL (ref 250–470)
UIBC: 232 ug/dL (ref 125–400)

## 2014-04-09 LAB — VITAMIN B12: Vitamin B-12: 230 pg/mL (ref 211–911)

## 2014-04-09 MED ORDER — ENOXAPARIN SODIUM 40 MG/0.4ML ~~LOC~~ SOLN: 40.0000 mg | SUBCUTANEOUS | Status: DC

## 2014-04-09 MED ORDER — CALCIUM CARBONATE-VITAMIN D 600-400 MG-UNIT PO TABS: 1.0000 | ORAL_TABLET | Freq: Two times a day (BID) | ORAL | Status: DC

## 2014-04-09 MED ORDER — HYDROCODONE-ACETAMINOPHEN 5-325 MG PO TABS: 1.0000 | ORAL_TABLET | Freq: Four times a day (QID) | ORAL | Status: DC | PRN

## 2014-04-09 MED ORDER — FUROSEMIDE 20 MG PO TABS: 10.0000 mg | ORAL_TABLET | Freq: Every day | ORAL | Status: DC

## 2014-04-09 MED ORDER — PREDNISONE 10 MG PO TABS: 10.0000 mg | ORAL_TABLET | Freq: Every day | ORAL | Status: DC

## 2014-04-09 NOTE — Discharge Summary (Signed)
Physician Discharge Summary  Tiffany Maxwell IHW:[id number] DOB: Oct 22, 1934 DOA: 04/07/2014  PCP: Redge Gainer, MD  Admit date: 04/07/2014 Discharge date: 04/09/2014  Time spent: Greater than 30 minutes  Recommendations for Outpatient Follow-up:  1. Recommend consideration a referral to interventional radiology for evaluation of kyphoplasty or vertebroplasty. 2. Recommend followup of her serum sodium and white blood cell count at the followup appointment. 3. Recommend ordering free T4 at her office followup or followup TSH in the next few weeks for followup evaluation. Recommend followup of her vitamin B12 level in 4-6 weeks. 4. Home health physical therapy was ordered upon discharge.   Discharge Diagnoses:  1. Thoracic and lumbar back pain. 2. Multiple compression fractures of T12-L4. Known history of thoracic compression fractures in 2014. 3. Osteopenia. 4. Hyponatremia, likely secondary to hypovolemia. Improved with IV fluid hydration. 5. Chronic respiratory failure secondary to oxygen-dependent COPD/emphysema. Remained stable. 6. Mild leukopenia, possibly secondary to hemodilution from IV fluids. 7. Hypertension. Remained controlled and stable. 8. Mildly low TSH.  Discharge Condition: Improved.  Diet recommendation: Heart healthy.  Filed Weights   04/07/14 2113 04/08/14 0100 04/09/14 0617  Weight: 49.442 kg (109 lb) 48.6 kg (107 lb 2.3 oz) 50.894 kg (112 lb 3.2 oz)    History of present illness:  The patient is a 80 year old woman with osteoporosis and a history of thoracic compression fractures, COPD, and coronary artery disease, who presented to the emergency department on 04/07/2014 with a chief complaint of back pain. In the emergency department, she was afebrile and hemodynamically stable though her blood pressure was on the low-normal side at 90/72. Her lab data were significant for a serum sodium of 127. Her urinalysis was within normal limits. X-ray of her lumbar spine  revealed osteopenia with T12-L4 compression fractures including a moderate to severe T12-L1 compression fracture; age indeterminate; no malalignment; possible right nephrolithiasis. She was admitted for further evaluation and management.  Hospital Course:  The patient was started on IV fluids for hydration. A number of studies were ordered to assess hyponatremia. Pain management was started with as needed IV and oral analgesics. MRI of the thoracic and lumbar spine were ordered for further evaluation and for potential referral for kyphoplasty or vertebral plasty. She was restarted on calcium chloride with vitamin D. A small course of prednisone was added for additional treatment of pain associated with the compression fractures.  Following normal saline IV infusion, her serum sodium improved to 134. Her serum and urine osmolality were low. Her cortisol level was within normal limits. Her TSH was slightly low at 0.275. Her vitamin B12 level was within normal limits, but was on the lower side of normal. Her total iron was slightly low at 40. Her white blood cell count decreased from 4.1-2.7, but this was thought to be secondary to hemodilution. She will need followup of her TSH, vitamin B12, WBC and serum sodium at her hospital followup appointments.  The patient was unable to tolerate laying flat for the MRI and therefore it was canceled. Following physical therapy and pain management, her back pain resolved. The physical therapist recommended home health physical therapy which was ordered. I offered to make an appointment for the patient to be evaluated by the interventional radiologist at [facility], but she declined and wanted to talk with her primary care provider first.   She was discharged to home in improved condition. She was continued on a small prednisone taper and as needed hydrocodone. She was instructed to decrease furosemide to  10 mg daily and to avoid dehydration. She was also  instructed to increase her calcium with vitamin D supplement from once daily to twice a day. These instructions were also discussed with her son.    Procedures:  None  Consultations:  None  Discharge Exam: Filed Vitals:   04/09/14 1333  BP: 127/63  Pulse: 66  Temp: 98.1 F (36.7 C)  Resp: 20    General: Alert small framed 80 year old Caucasian woman sitting up in bed, in no acute distress. Cardiovascular: S1, S2, with a soft systolic murmur. Respiratory: Decreased breath sounds in the bases, otherwise clear. Breathing nonlabored. Musculoskeletal: Mild tenderness upon palpation of her mid thoracic and lumbar spine. No surrounding erythema or edema. No acute hot red joints. No pedal edema.  Discharge Instructions You were cared for by a hospitalist during your hospital stay. If you have any questions about your discharge medications or the care you received while you were in the hospital after you are discharged, you can call the unit and asked to speak with the hospitalist on call if the hospitalist that took care of you is not available. Once you are discharged, your primary care physician will handle any further medical issues. Please note that NO REFILLS for any discharge medications will be authorized once you are discharged, as it is imperative that you return to your primary care physician (or establish a relationship with a primary care physician if you do not have one) for your aftercare needs so that they can reassess your need for medications and monitor your lab values.  Discharge Orders   Future Appointments Provider Department Dept Phone   04/16/2014 1:45 PM Mary-Margaret Hassell Done, [place] [phone]   05/06/2014 2:15 PM Mary-Margaret Hassell Done, [place] [facility] [phone]   Future Orders Complete By Expires   Diet - low sodium heart healthy  As directed    Discharge instructions  As directed    Increase activity slowly   As directed        Medication List         albuterol-ipratropium 18-103 MCG/ACT inhaler  Commonly known as:  COMBIVENT  Inhale 2 puffs into the lungs every 6 (six) hours as needed for wheezing.     aspirin EC 81 MG tablet  Take 81 mg by mouth daily.     benzonatate 100 MG capsule  Commonly known as:  TESSALON  Take 1 capsule (100 mg total) by mouth 2 (two) times daily as needed for cough.     Calcium Carbonate-Vitamin D 600-400 MG-UNIT per tablet  Commonly known as:  CALCIUM 600+D3  Take 1 tablet by mouth 2 (two) times daily.     furosemide 20 MG tablet  Commonly known as:  LASIX  Take 0.5 tablets (10 mg total) by mouth daily.     HYDROcodone-acetaminophen 5-325 MG per tablet  Commonly known as:  NORCO/VICODIN  Take 1 tablet by mouth every 6 (six) hours as needed for moderate pain.     lisinopril 40 MG tablet  Commonly known as:  PRINIVIL,ZESTRIL  Take 1 tablet (40 mg total) by mouth daily.     polyethylene glycol packet  Commonly known as:  MIRALAX / GLYCOLAX  Take 17 g by mouth daily.     potassium chloride SA 20 MEQ tablet  Commonly known as:  K-DUR,KLOR-CON  Take 1 tablet (20 mEq total) by mouth daily.     predniSONE 10 MG tablet  Commonly known as:  DELTASONE  Take 1 tablet (10 mg total) by mouth daily with breakfast. Take for 5 more days to reduce inflammation in your back.       Allergies  Allergen Reactions  . Codeine Other (See Comments)    headache  . Benicar Hct [Olmesartan Medoxomil-Hctz] Swelling and Rash  . Naproxen Itching, Rash and Other (See Comments)    Redness/flushing of skin       Follow-up Information   Follow up with Redge Gainer, MD On 04/16/2014. (at 1:45 pm)    Specialty:  Family Medicine   Contact information:   [address] [place] [address] [phone]        The results of significant diagnostics from this hospitalization (including imaging, microbiology, ancillary and laboratory) are listed below for  reference.    Significant Diagnostic Studies: Dg Chest 2 View  03/16/2014   CLINICAL DATA:  Shortness of breath  EXAM: CHEST  2 VIEW  COMPARISON:  04/28/2013  FINDINGS: Mild enlargement of cardiac silhouette.  Calcified tortuous thoracic aorta.  Mediastinal contours and pulmonary vascularity normal.  Emphysematous and bronchitic changes consistent with COPD.  Small right pleural effusion and questionable mild infiltrate at right base.  Remaining lungs clear.  No pneumothorax.  Diffuse osseous demineralization with multiple thoracic compression deformities, some which have increased in severity at the lower thoracic spine.  IMPRESSION: COPD changes with small right pleural effusion and questionable right basilar infiltrate.  Osseous demineralization with progressive thoracic compression deformities since previous exam.   Electronically Signed   By: Lavonia Dana M.D.   On: 03/16/2014 13:31   Dg Lumbar Spine Complete  04/07/2014   CLINICAL DATA:  Low back pain for 1 week, no injury.  EXAM: LUMBAR SPINE - COMPLETE 4+ VIEW  COMPARISON:  DG CHEST 2 VIEW dated 01/28/2013  FINDINGS: Patient is osteopenic limiting evaluation. Moderate to severe T12 and L1 compression fractures, mild to moderate L2, Mild L 3 and L4 compression fractures. No malalignment. Mild scoliosis. No destructive bony lesions. Moderate aortoiliac vascular calcifications. Left femoral neck pinning. Subcentimeter calcification projects the right abdomen, and could reflect nephrolithiasis.  IMPRESSION: Osteopenia with T12 through L4 compression fractures, including moderate to severe T12 and L1 compression fractures. These are age indeterminate, and if clinically indicated MRI with STIR sequences would be more sensitive.  No malalignment.  Possible right nephrolithiasis.   Electronically Signed   By: Elon Alas   On: 04/07/2014 23:02   Ct Head Wo Contrast  03/11/2014   CLINICAL DATA:  Fall earlier today  EXAM: CT HEAD WITHOUT CONTRAST  CT  CERVICAL SPINE WITHOUT CONTRAST  TECHNIQUE: Multidetector CT imaging of the head and cervical spine was performed following the standard protocol without intravenous contrast. Multiplanar CT image reconstructions of the cervical spine were also generated.  COMPARISON:  Prior brain MRI 12/06/2009  FINDINGS: CT HEAD FINDINGS  Negative for acute intracranial hemorrhage, acute infarction, mass, mass effect, hydrocephalus or midline shift. Gray-white differentiation is preserved throughout. Periventricular, subcortical and deep white matter hypoattenuation most consistent with chronic longstanding microvascular ischemic change. Comparing across modalities, mild interval progression compared to December of 2010. Globes and orbits are intact and unremarkable bilaterally. Bilateral lens extractions. No focal scalp hematoma or calvarial abnormality. The bones appear osteopenic. Normal aeration of the mastoid air cells and visualized paranasal sinuses. Atherosclerotic calcifications in the bilateral cavernous carotid arteries.  CT CERVICAL SPINE FINDINGS  Heterogeneous multinodular thyroid gland. Small nodules are subcentimeter in size. Dystrophic calcifications are noted in the left  gland. The inferior aspect of the gland is incompletely imaged. No acute soft tissue abnormality. No suspicious adenopathy. Atherosclerotic calcifications noted in the left greater than right carotid arteries. Combined paraseptal and centrilobular emphysema in the upper lungs. Left greater than right pleural parenchymal scarring. No acute fracture, malalignment or prevertebral soft tissue swelling. The bones are osteopenic in appearance. Mild multilevel cervical spondylosis No significant at C5-C6 and C6-C7.  IMPRESSION: CT HEAD  1. No acute intracranial abnormality. 2. Moderately advanced chronic longstanding microvascular ischemic white matter disease which demonstrates progression compared to December of 2010. 3. Intracranial atherosclerosis.  C SPINE  1. No acute fracture or malalignment. 2. Mild-to-moderate multilevel cervical spondylosis. 3. The bones appear osteopenic. 4. Combined paraseptal and centrilobular emphysema. 5. Heterogeneous multinodular thyroid gland.   Electronically Signed   By: Malachy Moan M.D.   On: 03/11/2014 01:11   Ct Cervical Spine Wo Contrast  03/11/2014   CLINICAL DATA:  Fall earlier today  EXAM: CT HEAD WITHOUT CONTRAST  CT CERVICAL SPINE WITHOUT CONTRAST  TECHNIQUE: Multidetector CT imaging of the head and cervical spine was performed following the standard protocol without intravenous contrast. Multiplanar CT image reconstructions of the cervical spine were also generated.  COMPARISON:  Prior brain MRI 12/06/2009  FINDINGS: CT HEAD FINDINGS  Negative for acute intracranial hemorrhage, acute infarction, mass, mass effect, hydrocephalus or midline shift. Gray-white differentiation is preserved throughout. Periventricular, subcortical and deep white matter hypoattenuation most consistent with chronic longstanding microvascular ischemic change. Comparing across modalities, mild interval progression compared to December of 2010. Globes and orbits are intact and unremarkable bilaterally. Bilateral lens extractions. No focal scalp hematoma or calvarial abnormality. The bones appear osteopenic. Normal aeration of the mastoid air cells and visualized paranasal sinuses. Atherosclerotic calcifications in the bilateral cavernous carotid arteries.  CT CERVICAL SPINE FINDINGS  Heterogeneous multinodular thyroid gland. Small nodules are subcentimeter in size. Dystrophic calcifications are noted in the left gland. The inferior aspect of the gland is incompletely imaged. No acute soft tissue abnormality. No suspicious adenopathy. Atherosclerotic calcifications noted in the left greater than right carotid arteries. Combined paraseptal and centrilobular emphysema in the upper lungs. Left greater than right pleural parenchymal scarring.  No acute fracture, malalignment or prevertebral soft tissue swelling. The bones are osteopenic in appearance. Mild multilevel cervical spondylosis No significant at C5-C6 and C6-C7.  IMPRESSION: CT HEAD  1. No acute intracranial abnormality. 2. Moderately advanced chronic longstanding microvascular ischemic white matter disease which demonstrates progression compared to December of 2010. 3. Intracranial atherosclerosis. C SPINE  1. No acute fracture or malalignment. 2. Mild-to-moderate multilevel cervical spondylosis. 3. The bones appear osteopenic. 4. Combined paraseptal and centrilobular emphysema. 5. Heterogeneous multinodular thyroid gland.   Electronically Signed   By: Malachy Moan M.D.   On: 03/11/2014 01:11    Microbiology: No results found for this or any previous visit (from the past 240 hour(s)).   Labs: Basic Metabolic Panel:  Recent Labs Lab 04/07/14 2200 04/09/14 0447  NA 127* 134*  K 4.1 4.3  CL 88* 97  CO2 31 29  GLUCOSE 101* 101*  BUN 8 6  CREATININE 0.51 0.43*  CALCIUM 9.3 9.3   Liver Function Tests:  Recent Labs Lab 04/09/14 0447  AST 18  ALT <5  ALKPHOS 75  BILITOT 0.3  PROT 5.8*  ALBUMIN 2.7*   No results found for this basename: LIPASE, AMYLASE,  in the last 168 hours No results found for this basename: AMMONIA,  in the last 168 hours  CBC:  Recent Labs Lab 04/07/14 2200 04/08/14 0449 04/09/14 0447  WBC 5.1 4.1 2.7*  NEUTROABS 3.3  --   --   HGB 11.6* 10.9* 11.1*  HCT 33.8* 32.5* 33.0*  MCV 83.3 84.0 84.4  PLT 189 205 213   Cardiac Enzymes: No results found for this basename: CKTOTAL, CKMB, CKMBINDEX, TROPONINI,  in the last 168 hours BNP: BNP (last 3 results) No results found for this basename: PROBNP,  in the last 8760 hours CBG: No results found for this basename: GLUCAP,  in the last 168 hours     Signed:  Rexene Alberts  [facility] 04/09/2014, 2:34 PM

## 2014-04-09 NOTE — Evaluation (Signed)
Physical Therapy Evaluation Patient Details Name: Tiffany Maxwell MRN: [id number] DOB: May 28, 1934 Today's Date: 04/09/2014   History of Present Illness   Pt is admitted with acute onset of lumbar pain along with hyponatremia.  She has a hx of multiple compression fx's of her spine and hip fx last year.  Her son lives with her but is only there intermittently due to his work schedule.  Pt is normally indpendent with most ADLs using a walker.  She has O2 dependent COPD and her baseline exertional level is very limited, able to walk only from one room to the next before needing to sit and rest.  Clinical Impression   Pt is seen for evaluation.  She is very alert and cooperative, has no pain at rest.  RN reports that pt appears to have much less pain today than yesterday.  Pt describes her pain as being at the  SI joints bilaterally with no radiation of pain.  She states that she has significant pain with movement, however I instructed her in log rolling in the bed and she was able to transfer from sidelying to sit to stand with no c/o of pain.  She is cautious with all movements.  She was able to amblate 20' with a walker and no report of pain.  She would benefit from having supervision at home at an increased level from what her son is currently able to provide.  I discussed this with her family and they are going to try to work something out.  She will need HHPT at d/c.    Follow Up Recommendations Home health PT    Equipment Recommendations  None recommended by PT    Recommendations for Other Services   none    Precautions / Restrictions Precautions Precautions: Fall Restrictions Weight Bearing Restrictions: No      Mobility  Bed Mobility Overal bed mobility: Modified Independent             General bed mobility comments: pt instructed in log rolling to protect back  Transfers Overall transfer level: Modified independent Equipment used: None                 Ambulation/Gait Ambulation/Gait assistance: Supervision Ambulation Distance (Feet): 20 Feet Assistive device: Rolling walker (2 wheeled) Gait Pattern/deviations: Trunk flexed   Gait velocity interpretation: Below normal speed for age/gender General Gait Details: pt on 2 L O2 due to COPD....gait is slow for energy conservation                      Balance Overall balance assessment: No apparent balance deficits (not formally assessed)                                             Home Living Family/patient expects to be discharged to:: Private residence Living Arrangements: Children Available Help at Discharge: Family;Available PRN/intermittently Type of Home: House Home Access: Ramped entrance     Home Layout: One level Home Equipment: Walker - 2 wheels;Cane - single point;Bedside commode;Shower seat;Hospital bed      Prior Function Level of Independence: Independent with assistive device(s)                       Extremity/Trunk Assessment               Lower Extremity Assessment: Overall  WFL for tasks assessed      Cervical / Trunk Assessment: Kyphotic  Communication   Communication: No difficulties  Cognition Arousal/Alertness: Awake/alert Behavior During Therapy: WFL for tasks assessed/performed Overall Cognitive Status: Within Functional Limits for tasks assessed                                    Assessment/Plan    PT Assessment All further PT needs can be met in the next venue of care  PT Diagnosis Acute pain;Difficulty walking   PT Problem List Decreased activity tolerance;Decreased mobility;Pain  PT Treatment Interventions     PT Goals (Current goals can be found in the Care Plan section) Acute Rehab PT Goals PT Goal Formulation: No goals set, d/c therapy         Barriers to discharge  limited family support                     End of Session Equipment Utilized During  Treatment: Gait belt Activity Tolerance: Patient tolerated treatment well Patient left: in chair;with call bell/phone within reach;with chair alarm set;with family/visitor present           Time: 8329-1916 PT Time Calculation (min): 52 min   Charges:   PT Evaluation $Initial PT Evaluation Tier I: 1 Procedure PT Treatments $Therapeutic Activity: 8-22 mins   PT G Codes:    Clinical Judgement:  Mobility Current:  CI Goal:  CI Discharge:  CI      Sable Feil 04/09/2014, 10:48 AM

## 2014-04-09 NOTE — Care Management Note (Signed)
    Page 1 of 1   04/09/2014     4:41:51 PM CARE MANAGEMENT NOTE 04/09/2014  Patient:  Tiffany Maxwell, Tiffany Maxwell   Account Number:  [id number]  Date Initiated:  04/09/2014  Documentation initiated by:  Vladimir Creeks  Subjective/Objective Assessment:   Admitted with back pain, and possible lumbar fracture, but unable to tolerate MRI. Patient is from home,  lives with familyand will return home at discharge     Action/Plan:   patient needs home health, physical therapy, and has used [facility] past. She states she would like to use them again, and would especially like Lattie Haw if she is available.   Anticipated DC Date:  04/09/2014   Anticipated DC Plan:  [place]  CM consult      [facility] Choice  HOME HEALTH   Choice offered to / List presented to:  C-1 Patient        [place] arranged  [place] [facility]      [place].   Status of service:  Completed, signed off Medicare Important Message given?   (If response is "NO", the following Medicare IM given date fields will be blank) Date Medicare IM given:   Date Additional Medicare IM given:    Discharge Disposition:  [place]  Per UR Regulation:  Reviewed for med. necessity/level of care/duration of stay  If discussed at [place] of Stay Meetings, dates discussed:    Comments:  04/09/14 [place] RN/CM. referral given to [facility], with [facility], but she states Lattie Haw no longer works there. Patient did state that she would take anyone if Lattie Haw was not available, because she liked everyone from [facility] if Lattie Haw was not available.

## 2014-04-09 NOTE — Progress Notes (Signed)
Patient discharged home with son. I explained her meds, appointment and prescriptions to them both. Helped patient wash up and dress, removed her IV and she is going home with her son. Elmo Putt RN

## 2014-04-10 DIAGNOSIS — Z9181 History of falling: Secondary | ICD-10-CM | POA: Diagnosis not present

## 2014-04-10 DIAGNOSIS — I10 Essential (primary) hypertension: Secondary | ICD-10-CM | POA: Diagnosis not present

## 2014-04-10 DIAGNOSIS — M810 Age-related osteoporosis without current pathological fracture: Secondary | ICD-10-CM | POA: Diagnosis not present

## 2014-04-10 DIAGNOSIS — M8448XD Pathological fracture, other site, subsequent encounter for fracture with routine healing: Secondary | ICD-10-CM | POA: Diagnosis not present

## 2014-04-10 DIAGNOSIS — J449 Chronic obstructive pulmonary disease, unspecified: Secondary | ICD-10-CM | POA: Diagnosis not present

## 2014-04-10 DIAGNOSIS — J438 Other emphysema: Secondary | ICD-10-CM | POA: Diagnosis not present

## 2014-04-10 DIAGNOSIS — IMO0001 Reserved for inherently not codable concepts without codable children: Secondary | ICD-10-CM | POA: Diagnosis not present

## 2014-04-13 DIAGNOSIS — IMO0001 Reserved for inherently not codable concepts without codable children: Secondary | ICD-10-CM | POA: Diagnosis not present

## 2014-04-13 DIAGNOSIS — I10 Essential (primary) hypertension: Secondary | ICD-10-CM | POA: Diagnosis not present

## 2014-04-13 DIAGNOSIS — M810 Age-related osteoporosis without current pathological fracture: Secondary | ICD-10-CM | POA: Diagnosis not present

## 2014-04-13 DIAGNOSIS — M8448XD Pathological fracture, other site, subsequent encounter for fracture with routine healing: Secondary | ICD-10-CM | POA: Diagnosis not present

## 2014-04-13 DIAGNOSIS — J438 Other emphysema: Secondary | ICD-10-CM | POA: Diagnosis not present

## 2014-04-13 DIAGNOSIS — Z9181 History of falling: Secondary | ICD-10-CM | POA: Diagnosis not present

## 2014-04-15 ENCOUNTER — Ambulatory Visit: Payer: Medicare Other | Admitting: Nurse Practitioner

## 2014-04-15 DIAGNOSIS — M8448XD Pathological fracture, other site, subsequent encounter for fracture with routine healing: Secondary | ICD-10-CM | POA: Diagnosis not present

## 2014-04-15 DIAGNOSIS — Z9181 History of falling: Secondary | ICD-10-CM | POA: Diagnosis not present

## 2014-04-15 DIAGNOSIS — I10 Essential (primary) hypertension: Secondary | ICD-10-CM | POA: Diagnosis not present

## 2014-04-15 DIAGNOSIS — J438 Other emphysema: Secondary | ICD-10-CM | POA: Diagnosis not present

## 2014-04-15 DIAGNOSIS — IMO0001 Reserved for inherently not codable concepts without codable children: Secondary | ICD-10-CM | POA: Diagnosis not present

## 2014-04-15 DIAGNOSIS — M810 Age-related osteoporosis without current pathological fracture: Secondary | ICD-10-CM | POA: Diagnosis not present

## 2014-04-16 ENCOUNTER — Ambulatory Visit: Payer: Medicare Other | Admitting: Nurse Practitioner

## 2014-04-17 DIAGNOSIS — M810 Age-related osteoporosis without current pathological fracture: Secondary | ICD-10-CM | POA: Diagnosis not present

## 2014-04-17 DIAGNOSIS — IMO0001 Reserved for inherently not codable concepts without codable children: Secondary | ICD-10-CM | POA: Diagnosis not present

## 2014-04-17 DIAGNOSIS — M8448XD Pathological fracture, other site, subsequent encounter for fracture with routine healing: Secondary | ICD-10-CM | POA: Diagnosis not present

## 2014-04-17 DIAGNOSIS — Z9181 History of falling: Secondary | ICD-10-CM | POA: Diagnosis not present

## 2014-04-17 DIAGNOSIS — I10 Essential (primary) hypertension: Secondary | ICD-10-CM | POA: Diagnosis not present

## 2014-04-17 DIAGNOSIS — J438 Other emphysema: Secondary | ICD-10-CM | POA: Diagnosis not present

## 2014-04-21 DIAGNOSIS — Z9181 History of falling: Secondary | ICD-10-CM | POA: Diagnosis not present

## 2014-04-21 DIAGNOSIS — IMO0001 Reserved for inherently not codable concepts without codable children: Secondary | ICD-10-CM | POA: Diagnosis not present

## 2014-04-21 DIAGNOSIS — J438 Other emphysema: Secondary | ICD-10-CM | POA: Diagnosis not present

## 2014-04-21 DIAGNOSIS — M810 Age-related osteoporosis without current pathological fracture: Secondary | ICD-10-CM | POA: Diagnosis not present

## 2014-04-21 DIAGNOSIS — M8448XD Pathological fracture, other site, subsequent encounter for fracture with routine healing: Secondary | ICD-10-CM | POA: Diagnosis not present

## 2014-04-21 DIAGNOSIS — I10 Essential (primary) hypertension: Secondary | ICD-10-CM | POA: Diagnosis not present

## 2014-04-23 DIAGNOSIS — M8448XD Pathological fracture, other site, subsequent encounter for fracture with routine healing: Secondary | ICD-10-CM | POA: Diagnosis not present

## 2014-04-23 DIAGNOSIS — IMO0001 Reserved for inherently not codable concepts without codable children: Secondary | ICD-10-CM | POA: Diagnosis not present

## 2014-04-23 DIAGNOSIS — J438 Other emphysema: Secondary | ICD-10-CM | POA: Diagnosis not present

## 2014-04-23 DIAGNOSIS — M810 Age-related osteoporosis without current pathological fracture: Secondary | ICD-10-CM | POA: Diagnosis not present

## 2014-04-23 DIAGNOSIS — I10 Essential (primary) hypertension: Secondary | ICD-10-CM | POA: Diagnosis not present

## 2014-04-23 DIAGNOSIS — Z9181 History of falling: Secondary | ICD-10-CM | POA: Diagnosis not present

## 2014-04-28 DIAGNOSIS — J438 Other emphysema: Secondary | ICD-10-CM | POA: Diagnosis not present

## 2014-04-28 DIAGNOSIS — M8448XD Pathological fracture, other site, subsequent encounter for fracture with routine healing: Secondary | ICD-10-CM | POA: Diagnosis not present

## 2014-04-28 DIAGNOSIS — IMO0001 Reserved for inherently not codable concepts without codable children: Secondary | ICD-10-CM | POA: Diagnosis not present

## 2014-04-28 DIAGNOSIS — M810 Age-related osteoporosis without current pathological fracture: Secondary | ICD-10-CM | POA: Diagnosis not present

## 2014-04-28 DIAGNOSIS — Z9181 History of falling: Secondary | ICD-10-CM | POA: Diagnosis not present

## 2014-04-28 DIAGNOSIS — I10 Essential (primary) hypertension: Secondary | ICD-10-CM | POA: Diagnosis not present

## 2014-04-30 DIAGNOSIS — M810 Age-related osteoporosis without current pathological fracture: Secondary | ICD-10-CM | POA: Diagnosis not present

## 2014-04-30 DIAGNOSIS — M8448XD Pathological fracture, other site, subsequent encounter for fracture with routine healing: Secondary | ICD-10-CM | POA: Diagnosis not present

## 2014-04-30 DIAGNOSIS — Z9181 History of falling: Secondary | ICD-10-CM | POA: Diagnosis not present

## 2014-04-30 DIAGNOSIS — I10 Essential (primary) hypertension: Secondary | ICD-10-CM | POA: Diagnosis not present

## 2014-04-30 DIAGNOSIS — IMO0001 Reserved for inherently not codable concepts without codable children: Secondary | ICD-10-CM | POA: Diagnosis not present

## 2014-04-30 DIAGNOSIS — J438 Other emphysema: Secondary | ICD-10-CM | POA: Diagnosis not present

## 2014-05-01 ENCOUNTER — Other Ambulatory Visit: Payer: Self-pay | Admitting: Nurse Practitioner

## 2014-05-05 DIAGNOSIS — J438 Other emphysema: Secondary | ICD-10-CM | POA: Diagnosis not present

## 2014-05-05 DIAGNOSIS — M810 Age-related osteoporosis without current pathological fracture: Secondary | ICD-10-CM | POA: Diagnosis not present

## 2014-05-05 DIAGNOSIS — I10 Essential (primary) hypertension: Secondary | ICD-10-CM | POA: Diagnosis not present

## 2014-05-05 DIAGNOSIS — Z9181 History of falling: Secondary | ICD-10-CM | POA: Diagnosis not present

## 2014-05-05 DIAGNOSIS — IMO0001 Reserved for inherently not codable concepts without codable children: Secondary | ICD-10-CM | POA: Diagnosis not present

## 2014-05-05 DIAGNOSIS — M8448XD Pathological fracture, other site, subsequent encounter for fracture with routine healing: Secondary | ICD-10-CM | POA: Diagnosis not present

## 2014-05-06 ENCOUNTER — Ambulatory Visit: Payer: Medicare Other | Admitting: Nurse Practitioner

## 2014-05-10 DIAGNOSIS — J438 Other emphysema: Secondary | ICD-10-CM | POA: Diagnosis not present

## 2014-05-10 DIAGNOSIS — J449 Chronic obstructive pulmonary disease, unspecified: Secondary | ICD-10-CM

## 2014-05-10 DIAGNOSIS — M810 Age-related osteoporosis without current pathological fracture: Secondary | ICD-10-CM | POA: Diagnosis not present

## 2014-05-10 DIAGNOSIS — I10 Essential (primary) hypertension: Secondary | ICD-10-CM

## 2014-05-10 DIAGNOSIS — M8448XD Pathological fracture, other site, subsequent encounter for fracture with routine healing: Secondary | ICD-10-CM | POA: Diagnosis not present

## 2014-05-10 DIAGNOSIS — Z9181 History of falling: Secondary | ICD-10-CM

## 2014-05-10 DIAGNOSIS — IMO0001 Reserved for inherently not codable concepts without codable children: Secondary | ICD-10-CM

## 2014-05-13 ENCOUNTER — Ambulatory Visit: Payer: Medicare Other | Admitting: Nurse Practitioner

## 2014-05-29 ENCOUNTER — Other Ambulatory Visit: Payer: Self-pay | Admitting: Nurse Practitioner

## 2014-06-08 ENCOUNTER — Ambulatory Visit (INDEPENDENT_AMBULATORY_CARE_PROVIDER_SITE_OTHER): Payer: Medicare Other | Admitting: Nurse Practitioner

## 2014-06-08 ENCOUNTER — Encounter: Payer: Self-pay | Admitting: Nurse Practitioner

## 2014-06-08 VITALS — BP 126/82 | HR 54 | Temp 98.1°F | Ht 61.0 in | Wt 106.6 lb

## 2014-06-08 DIAGNOSIS — D72829 Elevated white blood cell count, unspecified: Secondary | ICD-10-CM | POA: Diagnosis not present

## 2014-06-08 DIAGNOSIS — J439 Emphysema, unspecified: Secondary | ICD-10-CM

## 2014-06-08 DIAGNOSIS — J438 Other emphysema: Secondary | ICD-10-CM | POA: Diagnosis not present

## 2014-06-08 DIAGNOSIS — M810 Age-related osteoporosis without current pathological fracture: Secondary | ICD-10-CM

## 2014-06-08 DIAGNOSIS — E871 Hypo-osmolality and hyponatremia: Secondary | ICD-10-CM

## 2014-06-08 DIAGNOSIS — K5901 Slow transit constipation: Secondary | ICD-10-CM

## 2014-06-08 DIAGNOSIS — I10 Essential (primary) hypertension: Secondary | ICD-10-CM

## 2014-06-08 DIAGNOSIS — J449 Chronic obstructive pulmonary disease, unspecified: Secondary | ICD-10-CM

## 2014-06-08 DIAGNOSIS — R0902 Hypoxemia: Secondary | ICD-10-CM

## 2014-06-08 DIAGNOSIS — E876 Hypokalemia: Secondary | ICD-10-CM

## 2014-06-08 MED ORDER — POTASSIUM CHLORIDE CRYS ER 20 MEQ PO TBCR: 20.0000 meq | EXTENDED_RELEASE_TABLET | Freq: Every day | ORAL | Status: DC

## 2014-06-08 MED ORDER — LISINOPRIL 40 MG PO TABS: ORAL_TABLET | ORAL | Status: DC

## 2014-06-08 MED ORDER — POLYETHYLENE GLYCOL 3350 17 G PO PACK: 17.0000 g | PACK | Freq: Every day | ORAL | Status: DC

## 2014-06-08 MED ORDER — FUROSEMIDE 20 MG PO TABS: ORAL_TABLET | ORAL | Status: DC

## 2014-06-08 NOTE — Progress Notes (Signed)
Subjective:    Patient ID: Tiffany Maxwell, female    DOB: 03/20/1934, 80 y.o.   MRN: [id number]    Patinet here today for follow up of chronic medical problems. She is doing quite well today:  Hypertension This is a chronic problem. The current episode started more than 1 year ago. The problem is unchanged. The problem is controlled. Pertinent negatives include no chest pain, headaches, neck pain, palpitations, shortness of breath or sweats. Risk factors for coronary artery disease include dyslipidemia, family history, post-menopausal state and sedentary lifestyle. Past treatments include diuretics and ACE inhibitors. The current treatment provides moderate improvement. Compliance problems include diet and exercise.   COPD- Hypoxemia Patient has a long history of COPD and is on continuous oxygen at home- The only inhaler she currently uses is combivent. SHe says that she has been doing well as long as she stays on her oxygen. Hypokalemia Patient is currently on kdur- denies any cramping of lower ext. Hyponatremia Patient just adds a little Na to diet 1-2X a week- no c/o fatigue. Chronic back pain/compression fractures of back norco on an as needed basis- usually takes at least 1x a day- walking with a walker due to back pain.   Review of Systems  Respiratory: Negative for shortness of breath.   Cardiovascular: Negative for chest pain and palpitations.  Musculoskeletal: Negative for neck pain.  Neurological: Negative for headaches.       Objective:   Physical Exam  Constitutional: She is oriented to person, place, and time. She appears well-developed and well-nourished.  HENT:  Nose: Nose normal.  Mouth/Throat: Oropharynx is clear and moist.  Eyes: EOM are normal.  Neck: Trachea normal, normal range of motion and full passive range of motion without pain. Neck supple. No JVD present. Carotid bruit is not present. No thyromegaly present.  Cardiovascular: Normal rate, regular  rhythm, normal heart sounds and intact distal pulses.  Exam reveals no gallop and no friction rub.   No murmur heard. Pulmonary/Chest: Effort normal. She has wheezes (exp wheezes bil bases).  O2 via nasal cannnulla  Abdominal: Soft. Bowel sounds are normal. She exhibits no distension and no mass. There is no tenderness.  Musculoskeletal: Normal range of motion.  Walking with a walker  Lymphadenopathy:    She has no cervical adenopathy.  Neurological: She is alert and oriented to person, place, and time. She has normal reflexes.  Skin: Skin is warm and dry.  Psychiatric: She has a normal mood and affect. Her behavior is normal. Judgment and thought content normal.   BP 158/82  Pulse 54  Temp(Src) 98.1 F (36.7 C) (Oral)  Ht 5' 1"  (1.549 m)  Wt 106 lb 9.6 oz (48.353 kg)  BMI 20.15 kg/m2        Assessment & Plan:   1. Pulmonary emphysema, unspecified emphysema type   2. Osteoporosis, unspecified   3. Leukocytosis   4. Hypoxemia   5. Hyponatremia   6. Hypokalemia   7. Essential hypertension   8. COPD with chronic bronchitis   9. Slow transit constipation    Orders Placed This Encounter  Procedures  . CMP14+EGFR  . NMR, lipoprofile   Meds ordered this encounter  Medications  . polyethylene glycol (MIRALAX / GLYCOLAX) packet    Sig: Take 17 g by mouth daily.    Dispense:  30 packet    Refill:  5    Order Specific Question:  Supervising Provider    Answer:  Chipper Herb [[id number]]  .  furosemide (LASIX) 20 MG tablet    Sig: TAKE (1) TABLET DAILY FOR (EXCESSIVE) FLUID.    Dispense:  30 tablet    Refill:  2    Order Specific Question:  Supervising Provider    Answer:  Chipper Herb [[id number]]  . lisinopril (PRINIVIL,ZESTRIL) 40 MG tablet    Sig: TAKE 1 TABLET DAILY    Dispense:  30 tablet    Refill:  2    Order Specific Question:  Supervising Provider    Answer:  Chipper Herb [[id number]]  . potassium chloride SA (K-DUR,KLOR-CON) 20 MEQ tablet    Sig: Take 1 tablet (20  mEq total) by mouth daily.    Dispense:  30 tablet    Refill:  0    Order Specific Question:  Supervising Provider    Answer:  Chipper Herb [[id number]]    Labs pending Health maintenance reviewed Diet and exercise encouraged Continue all meds Follow up  In 3 months    [place], FNP

## 2014-06-08 NOTE — Patient Instructions (Signed)

## 2014-06-09 LAB — CMP14+EGFR
A/G RATIO: 2.3 (ref 1.1–2.5)
ALK PHOS: 83 IU/L (ref 39–117)
ALT: 7 IU/L (ref 0–32)
AST: 21 IU/L (ref 0–40)
Albumin: 4.2 g/dL (ref 3.5–4.8)
BUN / CREAT RATIO: 13 (ref 11–26)
BUN: 7 mg/dL — AB (ref 8–27)
CO2: 32 mmol/L — ABNORMAL HIGH (ref 18–29)
Calcium: 9.9 mg/dL (ref 8.7–10.3)
Chloride: 94 mmol/L — ABNORMAL LOW (ref 97–108)
Creatinine, Ser: 0.56 mg/dL — ABNORMAL LOW (ref 0.57–1.00)
GFR, EST AFRICAN AMERICAN: 103 mL/min/{1.73_m2} (ref >59)
GFR, EST NON AFRICAN AMERICAN: 90 mL/min/{1.73_m2} (ref >59)
GLOBULIN, TOTAL: 1.8 g/dL (ref 1.5–4.5)
Glucose: 108 mg/dL — ABNORMAL HIGH (ref 65–99)
Potassium: 5.2 mmol/L (ref 3.5–5.2)
SODIUM: 139 mmol/L (ref 134–144)
Total Bilirubin: 0.3 mg/dL (ref 0.0–1.2)
Total Protein: 6 g/dL (ref 6.0–8.5)

## 2014-06-09 LAB — NMR, LIPOPROFILE
Cholesterol: 208 mg/dL — ABNORMAL HIGH (ref 100–199)
HDL Cholesterol by NMR: 80 mg/dL (ref >39)
HDL PARTICLE NUMBER: 33.7 umol/L (ref >=30.5)
LDL Particle Number: 1076 nmol/L — ABNORMAL HIGH (ref <1000)
LDL Size: 21.2 nm (ref >20.5)
LDLC SERPL CALC-MCNC: 112 mg/dL — ABNORMAL HIGH (ref 0–99)
Small LDL Particle Number: <90 nmol/L (ref <=527)
Triglycerides by NMR: 80 mg/dL (ref 0–149)

## 2014-06-11 ENCOUNTER — Telehealth: Payer: Self-pay | Admitting: Family Medicine

## 2014-06-11 NOTE — Telephone Encounter (Signed)
Patient aware.

## 2014-06-11 NOTE — Telephone Encounter (Signed)
Message copied by Waverly Ferrari on Thu Jun 11, 2014 12:19 PM ------      Message from: [name]      Created: Tue Jun 09, 2014  8:49 AM       Kidney and liver function stable      LDL particle number and LDL are improving      Continue current meds- low fat diet and exercise and recheck in 3 months       ------

## 2014-06-24 ENCOUNTER — Encounter: Payer: Self-pay | Admitting: Nurse Practitioner

## 2014-08-19 ENCOUNTER — Other Ambulatory Visit: Payer: Self-pay | Admitting: *Deleted

## 2014-08-19 ENCOUNTER — Encounter: Payer: Self-pay | Admitting: *Deleted

## 2014-08-27 ENCOUNTER — Ambulatory Visit (INDEPENDENT_AMBULATORY_CARE_PROVIDER_SITE_OTHER): Payer: Medicare Other | Admitting: Nurse Practitioner

## 2014-08-27 ENCOUNTER — Other Ambulatory Visit: Payer: Self-pay | Admitting: *Deleted

## 2014-08-27 ENCOUNTER — Encounter: Payer: Self-pay | Admitting: Nurse Practitioner

## 2014-08-27 VITALS — BP 132/76 | HR 66 | Temp 98.5°F | Ht 61.0 in | Wt 109.0 lb

## 2014-08-27 DIAGNOSIS — M810 Age-related osteoporosis without current pathological fracture: Secondary | ICD-10-CM

## 2014-08-27 DIAGNOSIS — J438 Other emphysema: Secondary | ICD-10-CM | POA: Diagnosis not present

## 2014-08-27 DIAGNOSIS — J439 Emphysema, unspecified: Secondary | ICD-10-CM

## 2014-08-27 DIAGNOSIS — R0902 Hypoxemia: Secondary | ICD-10-CM | POA: Diagnosis not present

## 2014-08-27 DIAGNOSIS — J449 Chronic obstructive pulmonary disease, unspecified: Secondary | ICD-10-CM

## 2014-08-27 DIAGNOSIS — E871 Hypo-osmolality and hyponatremia: Secondary | ICD-10-CM | POA: Diagnosis not present

## 2014-08-27 DIAGNOSIS — I10 Essential (primary) hypertension: Secondary | ICD-10-CM

## 2014-08-27 DIAGNOSIS — E876 Hypokalemia: Secondary | ICD-10-CM

## 2014-08-27 MED ORDER — FUROSEMIDE 20 MG PO TABS: ORAL_TABLET | ORAL | Status: DC

## 2014-08-27 MED ORDER — IPRATROPIUM-ALBUTEROL 20-100 MCG/ACT IN AERS: INHALATION_SPRAY | RESPIRATORY_TRACT | Status: DC

## 2014-08-27 MED ORDER — LISINOPRIL 40 MG PO TABS: ORAL_TABLET | ORAL | Status: DC

## 2014-08-27 MED ORDER — POTASSIUM CHLORIDE CRYS ER 20 MEQ PO TBCR: 20.0000 meq | EXTENDED_RELEASE_TABLET | Freq: Every day | ORAL | Status: DC

## 2014-08-27 NOTE — Patient Instructions (Signed)

## 2014-08-27 NOTE — Progress Notes (Signed)
Subjective:    Patient ID: Tiffany Maxwell, female    DOB: [birth year], 80 y.o.   MRN: [id number]    Patinet here today for follow up of chronic medical problems. She is doing quite well today: Only compliant is having trouble holding herwater until she can get to restroom- doesn't want to take anymore pills.  Hypertension This is a chronic problem. The current episode started more than 1 year ago. The problem is unchanged. The problem is controlled. Pertinent negatives include no chest pain, headaches, neck pain, palpitations, shortness of breath or sweats. Risk factors for coronary artery disease include dyslipidemia, family history, post-menopausal state and sedentary lifestyle. Past treatments include diuretics and ACE inhibitors. The current treatment provides moderate improvement. Compliance problems include diet and exercise.   COPD- Hypoxemia Patient has a long history of COPD and is on continuous oxygen at home- The only inhaler she currently uses is combivent. SHe says that she has been doing well as long as she stays on her oxygen. Hypokalemia Patient is currently on kdur- denies any cramping of lower ext. Hyponatremia Patient just adds a little Na to diet 1-2X a week- no c/o fatigue. Chronic back pain/compression fractures of back norco on an as needed basis- usually takes at least 1x a day- walking with a walker due to back pain.   Review of Systems  Respiratory: Negative for shortness of breath.   Cardiovascular: Negative for chest pain and palpitations.  Musculoskeletal: Negative for neck pain.  Neurological: Negative for headaches.                                                                                                                                                       Objective:   Physical Exam  Constitutional: She is oriented to person, place, and time. She appears well-developed and well-nourished.  HENT:  Nose: Nose normal.  Mouth/Throat: Oropharynx is  clear and moist.  Eyes: EOM are normal.  Neck: Trachea normal, normal range of motion and full passive range of motion without pain. Neck supple. No JVD present. Carotid bruit is not present. No thyromegaly present.  Cardiovascular: Normal rate, regular rhythm, normal heart sounds and intact distal pulses.  Exam reveals no gallop and no friction rub.   No murmur heard. Pulmonary/Chest: Effort normal. She has wheezes (exp wheezes bil bases).  O2 via nasal cannnulla  Abdominal: Soft. Bowel sounds are normal. She exhibits no distension and no mass. There is no tenderness.  Musculoskeletal: Normal range of motion.  Walking with a walker  Lymphadenopathy:    She has no cervical adenopathy.  Neurological: She is alert and oriented to person, place, and time. She has normal reflexes.  Skin: Skin is warm and dry.  Psychiatric: She has a normal mood and affect. Her behavior is normal. Judgment and thought content  normal.   BP 132/76  Pulse 66  Temp(Src) 98.5 F (36.9 C) (Oral)  Ht 5' 1"  (1.549 m)  Wt 109 lb (49.442 kg)  BMI 20.61 kg/m2        Assessment & Plan:   1. Pulmonary emphysema, unspecified emphysema type   2. Osteoporosis, unspecified   3. Hypoxemia   4. Hyponatremia   5. Hypokalemia   6. Essential hypertension   7. COPD with chronic bronchitis    Orders Placed This Encounter  Procedures  . CMP14+EGFR  . NMR, lipoprofile   Meds ordered this encounter  Medications  . lisinopril (PRINIVIL,ZESTRIL) 40 MG tablet    Sig: TAKE 1 TABLET DAILY    Dispense:  30 tablet    Refill:  5    Order Specific Question:  Supervising Provider    Answer:  Chipper Herb [[id number]]  . furosemide (LASIX) 20 MG tablet    Sig: TAKE (1) TABLET DAILY FOR (EXCESSIVE) FLUID.    Dispense:  30 tablet    Refill:  5    Order Specific Question:  Supervising Provider    Answer:  Chipper Herb [[id number]]  . potassium chloride SA (K-DUR,KLOR-CON) 20 MEQ tablet    Sig: Take 1 tablet (20 mEq total) by  mouth daily.    Dispense:  30 tablet    Refill:  5    Order Specific Question:  Supervising Provider    Answer:  Chipper Herb [[id number]]  . Ipratropium-Albuterol (COMBIVENT RESPIMAT) 20-100 MCG/ACT AERS respimat    Sig: 2 PUFFS EVERY 6 HOURS AS NEEDED FOR WHEEZING. NO MORE THAN 6 PUFFS PER DAY    Dispense:  8 g    Refill:  4    Order Specific Question:  Supervising Provider    Answer:  Joycelyn Man   Reviewed health maintenance Refuses immunizations Watch diet Follow up in 3 months  [place], FNP

## 2014-08-28 ENCOUNTER — Telehealth: Payer: Self-pay | Admitting: Family Medicine

## 2014-08-28 NOTE — Telephone Encounter (Signed)
Spoke with patient and she is aware that she is do back in 3 months and once her labs are back we will call her with her results

## 2014-08-29 LAB — CMP14+EGFR
ALBUMIN: 4.2 g/dL (ref 3.5–4.8)
ALK PHOS: 76 IU/L (ref 39–117)
ALT: 7 IU/L (ref 0–32)
AST: 20 IU/L (ref 0–40)
Albumin/Globulin Ratio: 2.1 (ref 1.1–2.5)
BUN/Creatinine Ratio: 13 (ref 11–26)
BUN: 7 mg/dL — ABNORMAL LOW (ref 8–27)
CO2: 31 mmol/L — AB (ref 18–29)
Calcium: 9.9 mg/dL (ref 8.7–10.3)
Chloride: 95 mmol/L — ABNORMAL LOW (ref 97–108)
Creatinine, Ser: 0.54 mg/dL — ABNORMAL LOW (ref 0.57–1.00)
GFR calc non Af Amer: 91 mL/min/{1.73_m2} (ref >59)
GFR, EST AFRICAN AMERICAN: 105 mL/min/{1.73_m2} (ref >59)
GLUCOSE: 86 mg/dL (ref 65–99)
Globulin, Total: 2 g/dL (ref 1.5–4.5)
POTASSIUM: 5.1 mmol/L (ref 3.5–5.2)
Sodium: 137 mmol/L (ref 134–144)
Total Bilirubin: 0.2 mg/dL (ref 0.0–1.2)
Total Protein: 6.2 g/dL (ref 6.0–8.5)

## 2014-08-29 LAB — NMR, LIPOPROFILE
CHOLESTEROL: 218 mg/dL — AB (ref 100–199)
HDL CHOLESTEROL BY NMR: 77 mg/dL (ref >39)
HDL PARTICLE NUMBER: 33.6 umol/L (ref >=30.5)
LDL Particle Number: 1131 nmol/L — ABNORMAL HIGH (ref <1000)
LDL Size: 21.3 nm (ref >20.5)
LDLC SERPL CALC-MCNC: 122 mg/dL — ABNORMAL HIGH (ref 0–99)
Small LDL Particle Number: <90 nmol/L (ref <=527)
TRIGLYCERIDES BY NMR: 95 mg/dL (ref 0–149)

## 2014-08-31 ENCOUNTER — Telehealth: Payer: Self-pay | Admitting: *Deleted

## 2014-08-31 ENCOUNTER — Encounter: Payer: Self-pay | Admitting: *Deleted

## 2014-08-31 NOTE — Telephone Encounter (Signed)
Message copied by Rolena Infante on Mon Aug 31, 2014 10:21 AM ------      Message from: [name]      Created: Sun Aug 30, 2014  2:57 PM       Kidney and liver function stable      Cholesterol looks ok- going to make no changes at this time. ------

## 2014-11-14 ENCOUNTER — Emergency Department (HOSPITAL_COMMUNITY)
Admission: EM | Admit: 2014-11-14 | Discharge: 2014-11-14 | Disposition: A | Discharge status: Home / Self Care | Payer: Medicare Other | Attending: Emergency Medicine | Admitting: Emergency Medicine

## 2014-11-14 ENCOUNTER — Emergency Department (HOSPITAL_COMMUNITY): Payer: Medicare Other

## 2014-11-14 ENCOUNTER — Other Ambulatory Visit: Payer: Self-pay

## 2014-11-14 ENCOUNTER — Encounter (HOSPITAL_COMMUNITY): Payer: Self-pay | Admitting: Emergency Medicine

## 2014-11-14 DIAGNOSIS — Z8781 Personal history of (healed) traumatic fracture: Secondary | ICD-10-CM | POA: Insufficient documentation

## 2014-11-14 DIAGNOSIS — Z8739 Personal history of other diseases of the musculoskeletal system and connective tissue: Secondary | ICD-10-CM | POA: Insufficient documentation

## 2014-11-14 DIAGNOSIS — I5030 Unspecified diastolic (congestive) heart failure: Secondary | ICD-10-CM | POA: Diagnosis not present

## 2014-11-14 DIAGNOSIS — Z79899 Other long term (current) drug therapy: Secondary | ICD-10-CM | POA: Insufficient documentation

## 2014-11-14 DIAGNOSIS — J441 Chronic obstructive pulmonary disease with (acute) exacerbation: Secondary | ICD-10-CM | POA: Diagnosis not present

## 2014-11-14 DIAGNOSIS — I10 Essential (primary) hypertension: Secondary | ICD-10-CM | POA: Diagnosis not present

## 2014-11-14 DIAGNOSIS — Z8719 Personal history of other diseases of the digestive system: Secondary | ICD-10-CM | POA: Diagnosis not present

## 2014-11-14 DIAGNOSIS — J159 Unspecified bacterial pneumonia: Secondary | ICD-10-CM | POA: Diagnosis not present

## 2014-11-14 DIAGNOSIS — Z8639 Personal history of other endocrine, nutritional and metabolic disease: Secondary | ICD-10-CM | POA: Diagnosis not present

## 2014-11-14 DIAGNOSIS — J189 Pneumonia, unspecified organism: Secondary | ICD-10-CM | POA: Diagnosis not present

## 2014-11-14 DIAGNOSIS — I252 Old myocardial infarction: Secondary | ICD-10-CM | POA: Insufficient documentation

## 2014-11-14 DIAGNOSIS — Z7982 Long term (current) use of aspirin: Secondary | ICD-10-CM | POA: Diagnosis not present

## 2014-11-14 DIAGNOSIS — Z86711 Personal history of pulmonary embolism: Secondary | ICD-10-CM | POA: Insufficient documentation

## 2014-11-14 DIAGNOSIS — I517 Cardiomegaly: Secondary | ICD-10-CM | POA: Diagnosis not present

## 2014-11-14 DIAGNOSIS — R630 Anorexia: Secondary | ICD-10-CM | POA: Diagnosis not present

## 2014-11-14 DIAGNOSIS — Z872 Personal history of diseases of the skin and subcutaneous tissue: Secondary | ICD-10-CM | POA: Diagnosis not present

## 2014-11-14 DIAGNOSIS — R0602 Shortness of breath: Secondary | ICD-10-CM | POA: Diagnosis not present

## 2014-11-14 DIAGNOSIS — J440 Chronic obstructive pulmonary disease with acute lower respiratory infection: Secondary | ICD-10-CM | POA: Diagnosis not present

## 2014-11-14 DIAGNOSIS — Z87891 Personal history of nicotine dependence: Secondary | ICD-10-CM | POA: Diagnosis not present

## 2014-11-14 DIAGNOSIS — J984 Other disorders of lung: Secondary | ICD-10-CM | POA: Diagnosis not present

## 2014-11-14 LAB — COMPREHENSIVE METABOLIC PANEL
ALT: 6 U/L (ref 0–35)
AST: 20 U/L (ref 0–37)
Albumin: 3.5 g/dL (ref 3.5–5.2)
Alkaline Phosphatase: 79 U/L (ref 39–117)
Anion gap: 8 (ref 5–15)
BILIRUBIN TOTAL: 0.3 mg/dL (ref 0.3–1.2)
BUN: 7 mg/dL (ref 6–23)
CALCIUM: 10.2 mg/dL (ref 8.4–10.5)
CHLORIDE: 95 meq/L — AB (ref 96–112)
CO2: 34 meq/L — AB (ref 19–32)
Creatinine, Ser: 0.47 mg/dL — ABNORMAL LOW (ref 0.50–1.10)
GLUCOSE: 96 mg/dL (ref 70–99)
Potassium: 4.4 mEq/L (ref 3.7–5.3)
Sodium: 137 mEq/L (ref 137–147)
Total Protein: 6.6 g/dL (ref 6.0–8.3)

## 2014-11-14 LAB — CBC
HEMATOCRIT: 36.7 % (ref 36.0–46.0)
Hemoglobin: 12.6 g/dL (ref 12.0–15.0)
MCH: 30.5 pg (ref 26.0–34.0)
MCHC: 34.3 g/dL (ref 30.0–36.0)
MCV: 88.9 fL (ref 78.0–100.0)
Platelets: 212 10*3/uL (ref 150–400)
RBC: 4.13 MIL/uL (ref 3.87–5.11)
RDW: 12.5 % (ref 11.5–15.5)
WBC: 6 10*3/uL (ref 4.0–10.5)

## 2014-11-14 LAB — I-STAT TROPONIN, ED: Troponin i, poc: 0.01 ng/mL (ref 0.00–0.08)

## 2014-11-14 LAB — PRO B NATRIURETIC PEPTIDE: Pro B Natriuretic peptide (BNP): 151.7 pg/mL (ref 0–450)

## 2014-11-14 MED ORDER — SODIUM CHLORIDE 0.9 % IV SOLN
1000.0000 mL | Freq: Once | INTRAVENOUS | Status: AC
  Administered 2014-11-14: 1000 mL via INTRAVENOUS

## 2014-11-14 MED ORDER — PREDNISONE 10 MG PO TABS: 20.0000 mg | ORAL_TABLET | Freq: Two times a day (BID) | ORAL | Status: AC

## 2014-11-14 MED ORDER — SODIUM CHLORIDE 0.9 % IV SOLN: 1000.0000 mL | INTRAVENOUS | Status: DC

## 2014-11-14 MED ORDER — IPRATROPIUM-ALBUTEROL 0.5-2.5 (3) MG/3ML IN SOLN
3.0000 mL | Freq: Once | RESPIRATORY_TRACT | Status: AC
Start: 2014-11-14 — End: 2014-11-14
  Administered 2014-11-14: 3 mL via RESPIRATORY_TRACT
  Filled 2014-11-14: qty 3

## 2014-11-14 MED ORDER — AEROCHAMBER Z-STAT PLUS/MEDIUM MISC: 1.0000 | Freq: Once | Status: DC

## 2014-11-14 MED ORDER — ALBUTEROL SULFATE HFA 108 (90 BASE) MCG/ACT IN AERS
2.0000 | INHALATION_SPRAY | RESPIRATORY_TRACT | Status: DC | PRN
  Administered 2014-11-14: 2 via RESPIRATORY_TRACT
  Filled 2014-11-14: qty 6.7

## 2014-11-14 MED ORDER — LEVOFLOXACIN 500 MG PO TABS: 500.0000 mg | ORAL_TABLET | Freq: Every day | ORAL | Status: DC

## 2014-11-14 MED ORDER — LEVOFLOXACIN 500 MG PO TABS
500.0000 mg | ORAL_TABLET | Freq: Once | ORAL | Status: AC
  Administered 2014-11-14: 500 mg via ORAL
  Filled 2014-11-14: qty 1

## 2014-11-14 MED ORDER — PREDNISONE 50 MG PO TABS
60.0000 mg | ORAL_TABLET | Freq: Once | ORAL | Status: AC
  Administered 2014-11-14: 60 mg via ORAL
  Filled 2014-11-14 (×2): qty 1

## 2014-11-14 NOTE — Discharge Instructions (Signed)
Chronic Obstructive Pulmonary Disease Chronic obstructive pulmonary disease (COPD) is a common lung problem. In COPD, the flow of air from the lungs is limited. The way your lungs work will probably never return to normal, but there are things you can do to improve your lungs and make yourself feel better. HOME CARE  Take all medicines as told by your doctor.  Avoid medicines or cough syrups that dry up your airway (such as antihistamines) and do not allow you to get rid of thick spit. You do not need to avoid them if told differently by your doctor.  If you smoke, stop. Smoking makes the problem worse.  Avoid being around things that make your breathing worse (like smoke, chemicals, and fumes).  Use oxygen therapy and therapy to help improve your lungs (pulmonary rehabilitation) if told by your doctor. If you need home oxygen therapy, ask your doctor if you should buy a tool to measure your oxygen level (oximeter).  Avoid people who have a sickness you can catch (contagious).  Avoid going outside when it is very hot, cold, or humid.  Eat healthy foods. Eat smaller meals more often. Rest before meals.  Stay active, but remember to also rest.  Make sure to get all the shots (vaccines) your doctor recommends. Ask your doctor if you need a pneumonia shot.  Learn and use tips on how to relax.  Learn and use tips on how to control your breathing as told by your doctor. Try:  Breathing in (inhaling) through your nose for 1 second. Then, pucker your lips and breath out (exhale) through your lips for 2 seconds.  Putting one hand on your belly (abdomen). Breathe in slowly through your nose for 1 second. Your hand on your belly should move out. Pucker your lips and breathe out slowly through your lips. Your hand on your belly should move in as you breathe out.  Learn and use controlled coughing to clear thick spit from your lungs. The steps are: 1. Lean your head a little forward. 2. Breathe  in deeply. 3. Try to hold your breath for 3 seconds. 4. Keep your mouth slightly open while coughing 2 times. 5. Spit any thick spit out into a tissue. 6. Rest and do the steps again 1 or 2 times as needed. GET HELP IF:  You cough up more thick spit than usual.  There is a change in the color or thickness of the spit.  It is harder to breathe than usual.  Your breathing is faster than usual. GET HELP RIGHT AWAY IF:   You have shortness of breath while resting.  You have shortness of breath that stops you from:  Being able to talk.  Doing normal activities.  You chest hurts for longer than 5 minutes.  Your skin color is more blue than usual.  Your pulse oximeter shows that you have low oxygen for longer than 5 minutes. MAKE SURE YOU:   Understand these instructions.  Will watch your condition.  Will get help right away if you are not doing well or get worse. Document Released: 05/15/2008 Document Revised: 04/13/2014 Document Reviewed: 07/24/2013 Exit[employer] Patient Information 2015 [employer]. This information is not intended to replace advice given to you by your health care provider. Make sure you discuss any questions you have with your health care provider.  

## 2014-11-14 NOTE — ED Notes (Signed)
Patient complaining of shortness of breath. States "I think it's my nerves but I had to call the ambulance at 4:00 this morning and they gave me a breathing treatment and I felt better but now I'm short of breath again."

## 2014-11-14 NOTE — ED Provider Notes (Signed)
CSN: [id number]     Arrival date & time 11/14/14  1221 History  This chart was scribed for Shaune Pollack, MD by Tula Nakayama, ED Scribe. This patient was seen in room APA01/[facility]01 and the patient's care was started at 12:41 PM.    Chief Complaint  Patient presents with  . Shortness of Breath   The history is provided by the patient. No language interpreter was used.    HPI Comments: Tiffany Maxwell is a 80 y.o. female with a history of COPD, accompanied by her son, who presents to the Emergency Department complaining of 1 episode of SOB that occurred 8 hours ago. She notes decreased appetite due to nerves as an associated symptom. She also states chronic productive cough that is unchanged today. Pt reports that she called EMS and was given a breathing treatment with relief to her symptoms. She notes that she lives with her youngest son, who smokes cigarettes. Pt used to smoke, but quit in 2013. She uses inhaler and Combivent as needed, but reports she has run out of Combivalent. Pt is constantly on 2 L/[place]. Pt ate supper last night and breakfast today. She has only drank coffee today and denies EtOH use for the last several years. Pt has not received flu shot this year. Pt denies fever, and chest pain as associated symptoms.   PCP in [place]  Past Medical History  Diagnosis Date  . Osteoporosis   . Palpitations   . PAC (premature atrial contraction)   . Seborrheic keratosis   . Hypertension   . Hyperlipidemia   . COPD (chronic obstructive pulmonary disease)   . MI (myocardial infarction)     pt unaware  . Multiple fractures of thoracic spine, closed 01/30/2013  . Cholelithiasis 01/30/2013  . Pulmonary emphysema 01/30/2013  . History of tobacco use 01/30/2013  . Diastolic dysfunction 0/62/6948    Grade 1. Ejection fraction 65%.  . Compression fracture of lumbar spine, non-traumatic 04/09/2014   Past Surgical History  Procedure Laterality Date  . Tubal ligation    . Compression hip  screw Left 04/29/2013    Procedure: COMPRESSION HIP;  Surgeon: Sanjuana Kava, MD;  Location: [facility];  Service: Orthopedics;  Laterality: Left;  [employer]   Family History  Problem Relation Age of Onset  . Hypertension Mother    History  Substance Use Topics  . Smoking status: Former Smoker -- 1.00 packs/day for 40 years    Types: Cigarettes    Quit date: 07/10/2012  . Smokeless tobacco: Former Systems developer  . Alcohol Use: No   OB History    No data available     Review of Systems  Constitutional: Positive for appetite change. Negative for fever.  Respiratory: Positive for cough and shortness of breath.   Cardiovascular: Negative for chest pain.  All other systems reviewed and are negative.  Allergies  Codeine; Benicar hct; and Naproxen  Home Medications   Prior to Admission medications   Medication Sig Start Date End Date Taking? Authorizing Provider  aspirin EC 81 MG tablet Take 81 mg by mouth daily.    Historical Provider, MD  Calcium Carbonate-Vitamin D (CALCIUM 600+D3) 600-400 MG-UNIT per tablet Take 1 tablet by mouth 2 (two) times daily. 04/09/14   Rexene Alberts, MD  furosemide (LASIX) 20 MG tablet TAKE (1) TABLET DAILY FOR (EXCESSIVE) FLUID. 08/27/14   Mary-Margaret Hassell Done, FNP  Ipratropium-Albuterol (COMBIVENT RESPIMAT) 20-100 MCG/ACT AERS respimat 2 PUFFS EVERY 6 HOURS AS NEEDED FOR WHEEZING. NO MORE  THAN 6 PUFFS PER DAY 08/27/14   Mary-Margaret Hassell Done, FNP  lisinopril (PRINIVIL,ZESTRIL) 40 MG tablet TAKE 1 TABLET DAILY 08/27/14   Mary-Margaret Hassell Done, FNP  polyethylene glycol (MIRALAX / GLYCOLAX) packet Take 17 g by mouth daily. 06/08/14   Mary-Margaret Hassell Done, FNP  potassium chloride SA (K-DUR,KLOR-CON) 20 MEQ tablet Take 1 tablet (20 mEq total) by mouth daily. 08/27/14   Mary-Margaret Hassell Done, FNP   BP 138/62 mmHg  Pulse 65  Temp(Src) 98.9 F (37.2 C)  Resp 20  Ht 5\' 2"  (1.575 m)  Wt 109 lb (49.442 kg)  BMI 19.93 kg/m2  SpO2 100% Physical Exam  Constitutional: She is  oriented to person, place, and time. She appears well-developed and well-nourished.  HENT:  Head: Normocephalic and atraumatic.  Right Ear: External ear normal.  Left Ear: External ear normal.  Nose: Nose normal.  Mouth/Throat: Oropharynx is clear and moist.  Eyes: Conjunctivae and EOM are normal. Pupils are equal, round, and reactive to light.  Neck: Normal range of motion. Neck supple. No JVD present. No tracheal deviation present. No thyromegaly present.  Cardiovascular: Normal rate, regular rhythm, normal heart sounds and intact distal pulses.   Pulmonary/Chest: Effort normal and breath sounds normal. She has no wheezes.  Abdominal: Soft. Bowel sounds are normal. She exhibits no mass. There is no tenderness. There is no guarding.  Musculoskeletal: Normal range of motion.  Lymphadenopathy:    She has no cervical adenopathy.  Neurological: She is alert and oriented to person, place, and time. She has normal reflexes. No cranial nerve deficit or sensory deficit. Gait normal. GCS eye subscore is 4. GCS verbal subscore is 5. GCS motor subscore is 6.  Reflex Scores:      Bicep reflexes are 2+ on the right side and 2+ on the left side.      Patellar reflexes are 2+ on the right side and 2+ on the left side. Strength is normal and equal throughout. Cranial nerves grossly intact. Patient fluent. No gross ataxia and patient able to ambulate without difficulty.  Skin: Skin is warm and dry.  Psychiatric: She has a normal mood and affect. Her behavior is normal. Judgment and thought content normal.  Nursing note and vitals reviewed.   ED Course  Procedures (including critical care time) DIAGNOSTIC STUDIES: Oxygen Saturation is 100% on RA, normal by my interpretation.    COORDINATION OF CARE:t at bedside  12:47 PM Discussed treatment plan with pand pt agreed to plan.  Labs Review Labs Reviewed  COMPREHENSIVE METABOLIC PANEL - Abnormal; Notable for the following:    Chloride 95 (*)     CO2 34 (*)    Creatinine, Ser 0.47 (*)    All other components within normal limits  CBC  PRO B NATRIURETIC PEPTIDE  I-STAT TROPOININ, ED    Imaging Review Dg Chest 2 View  11/14/2014   CLINICAL DATA:  Short of breath  EXAM: CHEST  2 VIEW  COMPARISON:  03/16/2014  FINDINGS: Moderate cardiomegaly. Normal vascularity. Hyperaeration. Mild patchy density at the right base laterally has recurred. No pneumothorax. No pleural effusion. Stable thoracic spine with compression deformities and osteopenia.  IMPRESSION: Airspace disease at the lateral right base.  Cardiomegaly without CHF.   Electronically Signed   By: Maryclare Bean M.D.   On: 11/14/2014 14:21     EKG Interpretation None      Date: 11/14/2014  Rate: 69  Rhythm: normal sinus rhythm  QRS Axis: normal  Intervals: normal  ST/T Wave abnormalities: nonspecific  ST/T changes  Conduction Disutrbances:none  Narrative Interpretation:   Old EKG Reviewed: none available   MDM   Final diagnoses:  CAP (community acquired pneumonia)  Chronic obstructive pulmonary disease with acute lower respiratory infection   80 year old female with COPD who presents today with some increased dyspnea. Workup here reveals some increased density in the right lower lobe. Patient is afebrile and oxygenating well. She is on home oxygen. She has no significant wheezing on my exam. She is out of her inhaler at home. She'll be given a prescription for the [place], prednisone, and will be given an inhaler from here. She's given return precautions and voices understanding of return precautions and need for close follow-up.  Shaune Pollack, MD 11/14/14 [phone]

## 2014-12-02 ENCOUNTER — Other Ambulatory Visit: Payer: Self-pay | Admitting: *Deleted

## 2014-12-02 MED ORDER — ALBUTEROL SULFATE HFA 108 (90 BASE) MCG/ACT IN AERS: 2.0000 | INHALATION_SPRAY | Freq: Four times a day (QID) | RESPIRATORY_TRACT | Status: DC | PRN

## 2014-12-14 ENCOUNTER — Other Ambulatory Visit: Payer: Self-pay | Admitting: Nurse Practitioner

## 2014-12-23 ENCOUNTER — Ambulatory Visit: Payer: Medicare Other | Admitting: Nurse Practitioner

## 2014-12-25 ENCOUNTER — Encounter: Payer: Self-pay | Admitting: Nurse Practitioner

## 2014-12-25 ENCOUNTER — Ambulatory Visit (INDEPENDENT_AMBULATORY_CARE_PROVIDER_SITE_OTHER): Payer: Medicare Other | Admitting: Nurse Practitioner

## 2014-12-25 VITALS — BP 136/88 | HR 61 | Temp 97.4°F | Ht 62.0 in | Wt 112.0 lb

## 2014-12-25 DIAGNOSIS — I10 Essential (primary) hypertension: Secondary | ICD-10-CM

## 2014-12-25 DIAGNOSIS — Z87891 Personal history of nicotine dependence: Secondary | ICD-10-CM | POA: Diagnosis not present

## 2014-12-25 DIAGNOSIS — J449 Chronic obstructive pulmonary disease, unspecified: Secondary | ICD-10-CM | POA: Diagnosis not present

## 2014-12-25 DIAGNOSIS — E871 Hypo-osmolality and hyponatremia: Secondary | ICD-10-CM | POA: Diagnosis not present

## 2014-12-25 DIAGNOSIS — M810 Age-related osteoporosis without current pathological fracture: Secondary | ICD-10-CM | POA: Diagnosis not present

## 2014-12-25 DIAGNOSIS — E876 Hypokalemia: Secondary | ICD-10-CM | POA: Diagnosis not present

## 2014-12-25 DIAGNOSIS — J441 Chronic obstructive pulmonary disease with (acute) exacerbation: Secondary | ICD-10-CM

## 2014-12-25 DIAGNOSIS — D72829 Elevated white blood cell count, unspecified: Secondary | ICD-10-CM | POA: Diagnosis not present

## 2014-12-25 DIAGNOSIS — G933 Postviral fatigue syndrome: Secondary | ICD-10-CM | POA: Diagnosis not present

## 2014-12-25 DIAGNOSIS — J4489 Other specified chronic obstructive pulmonary disease: Secondary | ICD-10-CM

## 2014-12-25 MED ORDER — FUROSEMIDE 20 MG PO TABS: ORAL_TABLET | ORAL | Status: DC

## 2014-12-25 MED ORDER — AMOXICILLIN 875 MG PO TABS: 875.0000 mg | ORAL_TABLET | Freq: Two times a day (BID) | ORAL | Status: DC

## 2014-12-25 MED ORDER — LISINOPRIL 40 MG PO TABS: ORAL_TABLET | ORAL | Status: DC

## 2014-12-25 MED ORDER — POTASSIUM CHLORIDE CRYS ER 20 MEQ PO TBCR
20.0000 meq | EXTENDED_RELEASE_TABLET | Freq: Every day | ORAL | Status: DC
Start: 2014-12-25 — End: 2015-06-30

## 2014-12-25 MED ORDER — IPRATROPIUM-ALBUTEROL 20-100 MCG/ACT IN AERS: INHALATION_SPRAY | RESPIRATORY_TRACT | Status: DC

## 2014-12-25 NOTE — Patient Instructions (Signed)

## 2014-12-25 NOTE — Progress Notes (Signed)
Subjective:    Patient ID: Tiffany Maxwell, female    DOB: 1934/02/20, 81 y.o.   MRN: [id number]    Patinet here today for follow up of chronic medical problems. She is doing quite well today: Only compliant is having trouble holding her water until she can get to restroom- doesn't want to take anymore pills.  Hypertension This is a chronic problem. The current episode started more than 1 year ago. The problem is unchanged. The problem is controlled. Pertinent negatives include no chest pain, headaches, neck pain, palpitations or shortness of breath. Risk factors for coronary artery disease include post-menopausal state. Past treatments include ACE inhibitors and diuretics. The current treatment provides moderate improvement. Compliance problems include diet and exercise.   COPD- Hypoxemia Patient has a long history of COPD and is on continuous oxygen at home- The only inhaler she currently uses is combivent. SHe says that she has been doing well as long as she stays on her oxygen. Hypokalemia Patient is currently on kdur- denies any cramping of lower ext. Hyponatremia Patient just adds a little Na to diet 1-2X a week- no c/o fatigue. Chronic back pain/compression fractures of back norco on an as needed basis- usually takes at least 1x a day- walking with a walker due to back pain.   Review of Systems  Constitutional: Negative.   HENT: Negative.   Respiratory: Negative for shortness of breath.   Cardiovascular: Negative for chest pain and palpitations.  Genitourinary: Negative.   Musculoskeletal: Negative for neck pain.  Neurological: Negative for headaches.  Psychiatric/Behavioral: Negative.   All other systems reviewed and are negative.                                                                                                                                                      Objective:   Physical Exam  Constitutional: She is oriented to person, place, and time. She  appears well-developed and well-nourished.  HENT:  Nose: Nose normal.  Mouth/Throat: Oropharynx is clear and moist.  Eyes: EOM are normal.  Neck: Trachea normal, normal range of motion and full passive range of motion without pain. Neck supple. No JVD present. Carotid bruit is not present. No thyromegaly present.  Cardiovascular: Normal rate, regular rhythm, normal heart sounds and intact distal pulses.  Exam reveals no gallop and no friction rub.   No murmur heard. Pulmonary/Chest: Effort normal. She has wheezes (exp wheezes bil bases).  O2 via nasal cannnulla  Abdominal: Soft. Bowel sounds are normal. She exhibits no distension and no mass. There is no tenderness.  Musculoskeletal: Normal range of motion.  Walking with a walker  Lymphadenopathy:    She has no cervical adenopathy.  Neurological: She is alert and oriented to person, place, and time. She has normal reflexes.  Skin: Skin is warm and dry.  Psychiatric: She has a normal mood and affect. Her behavior is normal. Judgment and thought content normal.   BP 136/88 mmHg  Pulse 61  Temp(Src) 97.4 F (36.3 C) (Oral)  Ht 5' 2"  (1.575 m)  Wt 112 lb (50.803 kg)  BMI 20.48 kg/m2         Assessment & Plan:     ICD-9-CM ICD-10-CM   1. Essential hypertension 401.9 I10 CMP14+EGFR     NMR, lipoprofile     furosemide (LASIX) 20 MG tablet     lisinopril (PRINIVIL,ZESTRIL) 40 MG tablet  2. COPD with chronic bronchitis 491.20 J44.9 Ipratropium-Albuterol (COMBIVENT RESPIMAT) 20-100 MCG/ACT AERS respimat  3. Osteoporosis 733.00 M81.0   4. History of tobacco use V15.82 Z87.891   5. Hypokalemia 276.8 E87.6 potassium chloride SA (K-DUR,KLOR-CON) 20 MEQ tablet  6. Hyponatremia 276.1 E87.1   7. Leukocytosis 288.60 D72.829 Anemia Profile B  8. COPD exacerbation 491.21 J44.1    Health maintenance reviewed Follow up in 3 month Continue o2 as told  Mary-Margaret Hassell Done, FNP

## 2014-12-26 LAB — ANEMIA PROFILE B
Basophils Absolute: 0.1 10*3/uL (ref 0.0–0.2)
Basos: 1 %
EOS ABS: 0.8 10*3/uL — AB (ref 0.0–0.4)
Eos: 11 %
Ferritin: 279 ng/mL — ABNORMAL HIGH (ref 15–150)
Folate: 16.7 ng/mL (ref >3.0)
HEMATOCRIT: 38.6 % (ref 34.0–46.6)
HEMOGLOBIN: 12.8 g/dL (ref 11.1–15.9)
IRON SATURATION: 19 % (ref 15–55)
Immature Grans (Abs): 0 10*3/uL (ref 0.0–0.1)
Immature Granulocytes: 0 %
Iron: 53 ug/dL (ref 35–155)
LYMPHS ABS: 1.9 10*3/uL (ref 0.7–3.1)
Lymphs: 25 %
MCH: 29.8 pg (ref 26.6–33.0)
MCHC: 33.2 g/dL (ref 31.5–35.7)
MCV: 90 fL (ref 79–97)
MONOS ABS: 0.9 10*3/uL (ref 0.1–0.9)
Monocytes: 12 %
NEUTROS ABS: 3.9 10*3/uL (ref 1.4–7.0)
NEUTROS PCT: 51 %
Platelets: 241 10*3/uL (ref 150–379)
RBC: 4.3 x10E6/uL (ref 3.77–5.28)
RDW: 12.4 % (ref 12.3–15.4)
Retic Ct Pct: 1.3 % (ref 0.6–2.6)
TIBC: 278 ug/dL (ref 250–450)
UIBC: 225 ug/dL (ref 150–375)
Vitamin B-12: 308 pg/mL (ref 211–946)
WBC: 7.5 10*3/uL (ref 3.4–10.8)

## 2014-12-26 LAB — NMR, LIPOPROFILE
CHOLESTEROL: 216 mg/dL — AB (ref 100–199)
HDL Cholesterol by NMR: 81 mg/dL (ref >39)
HDL PARTICLE NUMBER: 35.6 umol/L (ref >=30.5)
LDL PARTICLE NUMBER: 1034 nmol/L — AB (ref <1000)
LDL Size: 21.3 nm (ref >20.5)
LDL-C: 113 mg/dL — ABNORMAL HIGH (ref 0–99)
Triglycerides by NMR: 108 mg/dL (ref 0–149)

## 2014-12-26 LAB — CMP14+EGFR
A/G RATIO: 2.1 (ref 1.1–2.5)
ALBUMIN: 4.2 g/dL (ref 3.5–4.8)
ALK PHOS: 81 IU/L (ref 39–117)
ALT: 5 IU/L (ref 0–32)
AST: 19 IU/L (ref 0–40)
BUN/Creatinine Ratio: 11 (ref 11–26)
BUN: 5 mg/dL — ABNORMAL LOW (ref 8–27)
CHLORIDE: 94 mmol/L — AB (ref 97–108)
CO2: 32 mmol/L — ABNORMAL HIGH (ref 18–29)
CREATININE: 0.44 mg/dL — AB (ref 0.57–1.00)
Calcium: 9.8 mg/dL (ref 8.7–10.3)
GFR calc Af Amer: 112 mL/min/{1.73_m2} (ref >59)
GFR calc non Af Amer: 97 mL/min/{1.73_m2} (ref >59)
Globulin, Total: 2 g/dL (ref 1.5–4.5)
Glucose: 91 mg/dL (ref 65–99)
Potassium: 4.5 mmol/L (ref 3.5–5.2)
SODIUM: 138 mmol/L (ref 134–144)
Total Bilirubin: 0.2 mg/dL (ref 0.0–1.2)
Total Protein: 6.2 g/dL (ref 6.0–8.5)

## 2015-01-04 ENCOUNTER — Other Ambulatory Visit: Payer: Self-pay | Admitting: Nurse Practitioner

## 2015-01-08 ENCOUNTER — Other Ambulatory Visit: Payer: Self-pay | Admitting: Nurse Practitioner

## 2015-02-22 ENCOUNTER — Other Ambulatory Visit: Payer: Self-pay | Admitting: Nurse Practitioner

## 2015-03-15 ENCOUNTER — Other Ambulatory Visit: Payer: Self-pay | Admitting: Nurse Practitioner

## 2015-03-29 ENCOUNTER — Ambulatory Visit (INDEPENDENT_AMBULATORY_CARE_PROVIDER_SITE_OTHER): Payer: Medicare Other | Admitting: Nurse Practitioner

## 2015-03-29 ENCOUNTER — Encounter: Payer: Self-pay | Admitting: Nurse Practitioner

## 2015-03-29 VITALS — BP 138/88 | HR 66 | Temp 97.4°F | Ht 62.0 in | Wt 116.0 lb

## 2015-03-29 DIAGNOSIS — I10 Essential (primary) hypertension: Secondary | ICD-10-CM

## 2015-03-29 DIAGNOSIS — E876 Hypokalemia: Secondary | ICD-10-CM | POA: Diagnosis not present

## 2015-03-29 DIAGNOSIS — M545 Low back pain, unspecified: Secondary | ICD-10-CM

## 2015-03-29 DIAGNOSIS — J449 Chronic obstructive pulmonary disease, unspecified: Secondary | ICD-10-CM | POA: Diagnosis not present

## 2015-03-29 MED ORDER — BENZONATATE 100 MG PO CAPS: 100.0000 mg | ORAL_CAPSULE | Freq: Three times a day (TID) | ORAL | Status: DC | PRN

## 2015-03-29 NOTE — Patient Instructions (Signed)

## 2015-03-29 NOTE — Progress Notes (Signed)
Subjective:    Patient ID: Tiffany Maxwell, female    DOB: October 20, 1934, 81 y.o.   MRN: [id number]    Patinet here today for follow up of chronic medical problems. She is doing quite well today- still has some urinary incontinence but does not want  To take another rmedication.   Hypertension This is a chronic problem. The current episode started more than 1 year ago. The problem is unchanged. The problem is controlled. Pertinent negatives include no chest pain, headaches, neck pain, palpitations or shortness of breath. Risk factors for coronary artery disease include post-menopausal state. Past treatments include ACE inhibitors and diuretics. The current treatment provides moderate improvement. Compliance problems include diet and exercise.   COPD- Hypoxemia Patient has a long history of COPD and is on continuous oxygen at home- The only inhaler she currently uses is combivent. SHe says that she has been doing well as long as she stays on her oxygen. Hypokalemia Patient is currently on kdur- denies any cramping of lower ext. Hyponatremia Patient just adds a little Na to diet 1-2X a week- no c/o fatigue. Chronic back pain/compression fractures of back norco on an as needed basis- usually takes at least 1x a day- walking with a walker due to back pain.   Review of Systems  Constitutional: Negative.   HENT: Negative.   Respiratory: Negative for shortness of breath.   Cardiovascular: Negative for chest pain and palpitations.  Genitourinary: Negative.   Musculoskeletal: Negative for neck pain.  Neurological: Negative for headaches.  Psychiatric/Behavioral: Negative.   All other systems reviewed and are negative.                                                                                                                                                      Objective:   Physical Exam  Constitutional: She is oriented to person, place, and time. She appears well-developed and  well-nourished.  HENT:  Nose: Nose normal.  Mouth/Throat: Oropharynx is clear and moist.  Eyes: EOM are normal.  Neck: Trachea normal, normal range of motion and full passive range of motion without pain. Neck supple. No JVD present. Carotid bruit is not present. No thyromegaly present.  Cardiovascular: Normal rate, regular rhythm, normal heart sounds and intact distal pulses.  Exam reveals no gallop and no friction rub.   No murmur heard. Pulmonary/Chest: Effort normal.  O2 via nasal cannnulla Diminished breath sounds throughout lung fields  Abdominal: Soft. Bowel sounds are normal. She exhibits no distension and no mass. There is no tenderness.  Musculoskeletal: Normal range of motion.  Walking with a walker  Lymphadenopathy:    She has no cervical adenopathy.  Neurological: She is alert and oriented to person, place, and time. She has normal reflexes.  Skin: Skin is warm and dry.  Psychiatric: She has a normal  mood and affect. Her behavior is normal. Judgment and thought content normal.   BP 138/88 mmHg  Pulse 66  Temp(Src) 97.4 F (36.3 C) (Oral)  Ht 5' 2"  (1.575 m)  Wt 116 lb (52.617 kg)  BMI 21.21 kg/m2          Assessment & Plan:   1. Essential hypertension Do not add salt to diet - CMP14+EGFR - NMR, lipoprofile  2. COPD with chronic bronchitis Continue neb treatments Continue o2 at home  3. Hypokalemia  4. Midline low back pain without sciatica    Labs pending Health maintenance reviewed Diet and exercise encouraged Continue all meds Follow up  In 3 month   [place], FNP

## 2015-03-30 LAB — CMP14+EGFR
ALT: 7 IU/L (ref 0–32)
AST: 19 IU/L (ref 0–40)
Albumin/Globulin Ratio: 2 (ref 1.1–2.5)
Albumin: 4.2 g/dL (ref 3.5–4.8)
Alkaline Phosphatase: 83 IU/L (ref 39–117)
BUN / CREAT RATIO: 15 (ref 11–26)
BUN: 7 mg/dL — ABNORMAL LOW (ref 8–27)
Bilirubin Total: 0.3 mg/dL (ref 0.0–1.2)
CHLORIDE: 92 mmol/L — AB (ref 97–108)
CO2: 32 mmol/L — ABNORMAL HIGH (ref 18–29)
Calcium: 10.2 mg/dL (ref 8.7–10.3)
Creatinine, Ser: 0.47 mg/dL — ABNORMAL LOW (ref 0.57–1.00)
GFR calc non Af Amer: 95 mL/min/{1.73_m2} (ref >59)
GFR, EST AFRICAN AMERICAN: 109 mL/min/{1.73_m2} (ref >59)
GLOBULIN, TOTAL: 2.1 g/dL (ref 1.5–4.5)
Glucose: 77 mg/dL (ref 65–99)
Potassium: 5.1 mmol/L (ref 3.5–5.2)
SODIUM: 139 mmol/L (ref 134–144)
Total Protein: 6.3 g/dL (ref 6.0–8.5)

## 2015-03-30 LAB — NMR, LIPOPROFILE
Cholesterol: 246 mg/dL — ABNORMAL HIGH (ref 100–199)
HDL Cholesterol by NMR: 91 mg/dL (ref >39)
HDL PARTICLE NUMBER: 38.9 umol/L (ref >=30.5)
LDL PARTICLE NUMBER: 1236 nmol/L — AB (ref <1000)
LDL SIZE: 21.4 nm (ref >20.5)
LDL-C: 131 mg/dL — ABNORMAL HIGH (ref 0–99)
Small LDL Particle Number: <90 nmol/L (ref <=527)
Triglycerides by NMR: 120 mg/dL (ref 0–149)

## 2015-06-09 ENCOUNTER — Other Ambulatory Visit: Payer: Self-pay | Admitting: Nurse Practitioner

## 2015-06-22 ENCOUNTER — Other Ambulatory Visit: Payer: Self-pay | Admitting: Nurse Practitioner

## 2015-06-28 ENCOUNTER — Ambulatory Visit: Payer: Medicare Other | Admitting: Nurse Practitioner

## 2015-06-30 ENCOUNTER — Encounter: Payer: Self-pay | Admitting: Nurse Practitioner

## 2015-06-30 ENCOUNTER — Ambulatory Visit (INDEPENDENT_AMBULATORY_CARE_PROVIDER_SITE_OTHER): Payer: Medicare Other | Admitting: Nurse Practitioner

## 2015-06-30 VITALS — BP 136/73 | HR 68 | Temp 97.6°F | Ht 62.0 in | Wt 112.0 lb

## 2015-06-30 DIAGNOSIS — M810 Age-related osteoporosis without current pathological fracture: Secondary | ICD-10-CM

## 2015-06-30 DIAGNOSIS — J439 Emphysema, unspecified: Secondary | ICD-10-CM | POA: Diagnosis not present

## 2015-06-30 DIAGNOSIS — Z23 Encounter for immunization: Secondary | ICD-10-CM | POA: Diagnosis not present

## 2015-06-30 DIAGNOSIS — I10 Essential (primary) hypertension: Secondary | ICD-10-CM | POA: Diagnosis not present

## 2015-06-30 DIAGNOSIS — J449 Chronic obstructive pulmonary disease, unspecified: Secondary | ICD-10-CM

## 2015-06-30 DIAGNOSIS — E876 Hypokalemia: Secondary | ICD-10-CM | POA: Diagnosis not present

## 2015-06-30 DIAGNOSIS — I5189 Other ill-defined heart diseases: Secondary | ICD-10-CM

## 2015-06-30 DIAGNOSIS — R0902 Hypoxemia: Secondary | ICD-10-CM

## 2015-06-30 DIAGNOSIS — I519 Heart disease, unspecified: Secondary | ICD-10-CM

## 2015-06-30 MED ORDER — POTASSIUM CHLORIDE CRYS ER 20 MEQ PO TBCR: 20.0000 meq | EXTENDED_RELEASE_TABLET | Freq: Every day | ORAL | Status: DC

## 2015-06-30 MED ORDER — LISINOPRIL 40 MG PO TABS: ORAL_TABLET | ORAL | Status: DC

## 2015-06-30 MED ORDER — FUROSEMIDE 20 MG PO TABS: ORAL_TABLET | ORAL | Status: DC

## 2015-06-30 NOTE — Patient Instructions (Signed)
Bone Health Our bones do many things. They provide structure, protect organs, anchor muscles, and store calcium. Adequate calcium in your diet and weight-bearing physical activity help build strong bones, improve bone amounts, and may reduce the risk of weakening of bones (osteoporosis) later in life. PEAK BONE MASS By age 79, the average woman has acquired most of her skeletal bone mass. A large decline occurs in older adults which increases the risk of osteoporosis. In women this occurs around the time of menopause. It is important for young girls to reach their peak bone mass in order to maintain bone health throughout life. A person with high bone mass as a young adult will be more likely to have a higher bone mass later in life. Not enough calcium consumption and physical activity early on could result in a failure to achieve optimum bone mass in adulthood. OSTEOPOROSIS Osteoporosis is a disease of the bones. It is defined as low bone mass with deterioration of bone structure. Osteoporosis leads to an increase risk of fractures with falls. These fractures commonly happen in the wrist, hip, and spine. While men and women of all ages and background can develop osteoporosis, some of the risk factors for osteoporosis are:  Female.  White.  Postmenopausal.  Older adults.  Small in body size.  Eating a diet low in calcium.  Physically inactive.  Smoking.  Use of some medications.  Family history. CALCIUM Calcium is a mineral needed by the body for healthy bones, teeth, and proper function of the heart, muscles, and nerves. The body cannot produce calcium so it must be absorbed through food. Good sources of calcium include:  Dairy products (low fat or nonfat milk, cheese, and yogurt).  Dark green leafy vegetables (bok choy and broccoli).  Calcium fortified foods (orange juice, cereal, bread, soy beverages, and tofu products).  Nuts (almonds). Recommended amounts of calcium vary  for individuals. RECOMMENDED CALCIUM INTAKES Age and Amount in mg per day  Children 1 to 3 years / 700 mg  Children 4 to 8 years / 1,000 mg  Children 9 to 13 years / 1,300 mg  Teens 14 to 18 years / 1,300 mg  Adults 19 to 50 years / 1,000 mg  Adult women 51 to 70 years / 1,200 mg  Adults 71 years and older / 1,200 mg  Pregnant and breastfeeding teens / 1,300 mg  Pregnant and breastfeeding adults / 1,000 mg Vitamin D also plays an important role in healthy bone development. Vitamin D helps in the absorption of calcium. WEIGHT-BEARING PHYSICAL ACTIVITY Regular physical activity has many positive health benefits. Benefits include strong bones. Weight-bearing physical activity early in life is important in reaching peak bone mass. Weight-bearing physical activities cause muscles and bones to work against gravity. Some examples of weight bearing physical activities include:  Walking, jogging, or running.  Field Hockey.  Jumping rope.  Dancing.  Soccer.  Tennis or Racquetball.  Stair climbing.  Basketball.  Hiking.  Weight lifting.  Aerobic fitness classes. Including weight-bearing physical activity into an exercise plan is a great way to keep bones healthy. Adults: Engage in at least 30 minutes of moderate physical activity on most, preferably all, days of the week. Children: Engage in at least 60 minutes of moderate physical activity on most, preferably all, days of the week. FOR MORE INFORMATION [place] Department of Agriculture, Center for Nutrition Policy and Promotion: www.cnpp.usda.gov National Osteoporosis Foundation: www.nof.org Document Released: 02/17/2004 Document Revised: 03/24/2013 Document Reviewed: 05/19/2009 Exit[employer] Patient Information   2015 [employer]. This information is not intended to replace advice given to you by your health care provider. Make sure you discuss any questions you have with your health care provider.  

## 2015-06-30 NOTE — Addendum Note (Signed)
Addended by: Rolena Infante on: 06/30/2015 04:21 PM   Modules accepted: Orders

## 2015-06-30 NOTE — Progress Notes (Signed)
Subjective:    Patient ID: Tiffany Maxwell, female    DOB: October 04, 1934, 81 y.o.   MRN: [id number]    Patinet here today for follow up of chronic medical problems. She is doing quite well today- still has some urinary incontinence but does not want  To take another rmedication.   Hypertension This is a chronic problem. The current episode started more than 1 year ago. The problem is unchanged. The problem is controlled. Pertinent negatives include no chest pain, headaches, neck pain, palpitations or shortness of breath. Risk factors for coronary artery disease include post-menopausal state. Past treatments include ACE inhibitors and diuretics. The current treatment provides moderate improvement. Compliance problems include diet and exercise.   COPD- Hypoxemia Patient has a long history of COPD and is on continuous oxygen at home- The only inhaler she currently uses is combivent. SHe says that she has been doing well as long as she stays on her oxygen. Hypokalemia Patient is currently on kdur- denies any cramping of lower ext. Hyponatremia Patient just adds a little Na to diet 1-2X a week- no c/o fatigue. Chronic back pain/compression fractures of back norco on an as needed basis- usually takes at least 1x a day- walking with a walker due to back pain.   Review of Systems  Constitutional: Negative.   HENT: Negative.   Respiratory: Negative for shortness of breath.   Cardiovascular: Negative for chest pain and palpitations.  Genitourinary: Negative.   Musculoskeletal: Negative for neck pain.  Neurological: Negative for headaches.  Psychiatric/Behavioral: Negative.   All other systems reviewed and are negative.                                                                                                                                                      Objective:   Physical Exam  Constitutional: She is oriented to person, place, and time. She appears well-developed and  well-nourished.  HENT:  Nose: Nose normal.  Mouth/Throat: Oropharynx is clear and moist.  Eyes: EOM are normal.  Neck: Trachea normal, normal range of motion and full passive range of motion without pain. Neck supple. No JVD present. Carotid bruit is not present. No thyromegaly present.  Cardiovascular: Normal rate, regular rhythm, normal heart sounds and intact distal pulses.  Exam reveals no gallop and no friction rub.   No murmur heard. Pulmonary/Chest: Effort normal.  O2 via nasal cannnulla Diminished breath sounds throughout lung fields  Abdominal: Soft. Bowel sounds are normal. She exhibits no distension and no mass. There is no tenderness.  Musculoskeletal: Normal range of motion.  Walking with a walker  Lymphadenopathy:    She has no cervical adenopathy.  Neurological: She is alert and oriented to person, place, and time. She has normal reflexes.  Skin: Skin is warm and dry.  Psychiatric: She has a normal  mood and affect. Her behavior is normal. Judgment and thought content normal.   BP 136/73 mmHg  Pulse 68  Temp(Src) 97.6 F (36.4 C) (Oral)  Ht 5' 2"  (1.575 m)  Wt 112 lb (50.803 kg)  BMI 20.48 kg/m2       Assessment & Plan:  1. Essential hypertension Do not add salt to diet - lisinopril (PRINIVIL,ZESTRIL) 40 MG tablet; TAKE 1 TABLET DAILY  Dispense: 30 tablet; Refill: 5 - furosemide (LASIX) 20 MG tablet; TAKE (1) TABLET DAILY FOR (EXCESSIVE) FLUID.  Dispense: 30 tablet; Refill: 5 - CMP14+EGFR - Lipid panel  2. Pulmonary emphysema, unspecified emphysema type contiunue o2  3. COPD with chronic bronchitis Keep follow up with pulmonologist  4. Osteoporosis Weight bearing exercise  5. Hypoxemia  6. Hypokalemia - potassium chloride SA (K-DUR,KLOR-CON) 20 MEQ tablet; Take 1 tablet (20 mEq total) by mouth daily.  Dispense: 30 tablet; Refill: 5  7. Diastolic dysfunction   Labs pending Health maintenance reviewed Diet and exercise encouraged Continue all  meds Follow up  In 3 month    [place], FNP

## 2015-07-01 LAB — CMP14+EGFR
ALT: 5 IU/L (ref 0–32)
AST: 15 IU/L (ref 0–40)
Albumin/Globulin Ratio: 2.1 (ref 1.1–2.5)
Albumin: 4 g/dL (ref 3.5–4.8)
Alkaline Phosphatase: 72 IU/L (ref 39–117)
BILIRUBIN TOTAL: 0.2 mg/dL (ref 0.0–1.2)
BUN / CREAT RATIO: 16 (ref 11–26)
BUN: 7 mg/dL — ABNORMAL LOW (ref 8–27)
CO2: 31 mmol/L — ABNORMAL HIGH (ref 18–29)
CREATININE: 0.43 mg/dL — AB (ref 0.57–1.00)
Calcium: 9.6 mg/dL (ref 8.7–10.3)
Chloride: 91 mmol/L — ABNORMAL LOW (ref 97–108)
GFR, EST AFRICAN AMERICAN: 112 mL/min/{1.73_m2} (ref >59)
GFR, EST NON AFRICAN AMERICAN: 97 mL/min/{1.73_m2} (ref >59)
Globulin, Total: 1.9 g/dL (ref 1.5–4.5)
Glucose: 76 mg/dL (ref 65–99)
Potassium: 4.2 mmol/L (ref 3.5–5.2)
SODIUM: 135 mmol/L (ref 134–144)
Total Protein: 5.9 g/dL — ABNORMAL LOW (ref 6.0–8.5)

## 2015-07-01 LAB — LIPID PANEL
CHOLESTEROL TOTAL: 220 mg/dL — AB (ref 100–199)
Chol/HDL Ratio: 2.8 ratio units (ref 0.0–4.4)
HDL: 80 mg/dL (ref >39)
LDL Calculated: 115 mg/dL — ABNORMAL HIGH (ref 0–99)
Triglycerides: 124 mg/dL (ref 0–149)
VLDL Cholesterol Cal: 25 mg/dL (ref 5–40)

## 2015-07-06 ENCOUNTER — Other Ambulatory Visit: Payer: Self-pay | Admitting: Nurse Practitioner

## 2015-07-28 ENCOUNTER — Other Ambulatory Visit: Payer: Self-pay | Admitting: Nurse Practitioner

## 2015-07-28 NOTE — Telephone Encounter (Signed)
Last seen 06/30/15 MMM

## 2015-08-23 ENCOUNTER — Other Ambulatory Visit: Payer: Self-pay | Admitting: Nurse Practitioner

## 2015-08-24 ENCOUNTER — Telehealth: Payer: Self-pay | Admitting: Nurse Practitioner

## 2015-08-24 NOTE — Telephone Encounter (Signed)
Patient is out of inhalers and she is not due to get from pharmacy until Friday

## 2015-08-24 NOTE — Telephone Encounter (Signed)
Give patient symbicort sampl to use until can get inhalers filled

## 2015-08-24 NOTE — Telephone Encounter (Signed)
Patient aware and sample given.  

## 2015-09-15 ENCOUNTER — Other Ambulatory Visit: Payer: Self-pay | Admitting: Nurse Practitioner

## 2015-10-01 ENCOUNTER — Encounter: Payer: Self-pay | Admitting: Nurse Practitioner

## 2015-10-01 ENCOUNTER — Ambulatory Visit (INDEPENDENT_AMBULATORY_CARE_PROVIDER_SITE_OTHER): Payer: Medicare Other | Admitting: Nurse Practitioner

## 2015-10-01 VITALS — BP 117/64 | HR 71 | Temp 97.9°F | Ht 62.0 in | Wt 122.0 lb

## 2015-10-01 DIAGNOSIS — I10 Essential (primary) hypertension: Secondary | ICD-10-CM | POA: Diagnosis not present

## 2015-10-01 DIAGNOSIS — E876 Hypokalemia: Secondary | ICD-10-CM

## 2015-10-01 DIAGNOSIS — J439 Emphysema, unspecified: Secondary | ICD-10-CM | POA: Diagnosis not present

## 2015-10-01 DIAGNOSIS — Z23 Encounter for immunization: Secondary | ICD-10-CM | POA: Diagnosis not present

## 2015-10-01 DIAGNOSIS — J449 Chronic obstructive pulmonary disease, unspecified: Secondary | ICD-10-CM | POA: Diagnosis not present

## 2015-10-01 DIAGNOSIS — R0902 Hypoxemia: Secondary | ICD-10-CM | POA: Diagnosis not present

## 2015-10-01 NOTE — Progress Notes (Signed)
Subjective:    Patient ID: Tiffany Maxwell, female    DOB: [birth year], 81 y.o.   MRN: [id number]    Patinet here today for follow up of chronic medical problems. She is doing quite well today- she has no complaints.   Hypertension This is a chronic problem. The current episode started more than 1 year ago. The problem is unchanged. The problem is controlled. Pertinent negatives include no chest pain, headaches, neck pain, palpitations or shortness of breath. Risk factors for coronary artery disease include post-menopausal state. Past treatments include ACE inhibitors and diuretics. The current treatment provides moderate improvement. Compliance problems include diet and exercise.   COPD- Hypoxemia Patient has a long history of COPD and is on continuous oxygen at home- The only inhaler she currently uses is combivent. SHe says that she has been doing well as long as she stays on her oxygen. Hypokalemia Patient is currently on kdur- denies any cramping of lower ext. Hyponatremia Patient just adds a little Na to diet 1-2X a week- no c/o fatigue. Chronic back pain/compression fractures of back norco on an as needed basis- usually takes at least 1x a day- walking with a walker due to back pain.   Review of Systems  Constitutional: Negative.   HENT: Negative.   Respiratory: Negative for shortness of breath.   Cardiovascular: Negative for chest pain and palpitations.  Genitourinary: Negative.   Musculoskeletal: Negative for neck pain.  Neurological: Negative for headaches.  Psychiatric/Behavioral: Negative.   All other systems reviewed and are negative.                                                                                                                                                      Objective:   Physical Exam  Constitutional: She is oriented to person, place, and time. She appears well-developed and well-nourished.  HENT:  Nose: Nose normal.  Mouth/Throat: Oropharynx  is clear and moist.  Eyes: EOM are normal.  Neck: Trachea normal, normal range of motion and full passive range of motion without pain. Neck supple. No JVD present. Carotid bruit is not present. No thyromegaly present.  Cardiovascular: Normal rate, regular rhythm, normal heart sounds and intact distal pulses.  Exam reveals no gallop and no friction rub.   No murmur heard. Pulmonary/Chest: Effort normal.  O2 via nasal cannnulla Diminished breath sounds throughout lung fields  Abdominal: Soft. Bowel sounds are normal. She exhibits no distension and no mass. There is no tenderness.  Musculoskeletal: Normal range of motion.  Walking with a walker  Lymphadenopathy:    She has no cervical adenopathy.  Neurological: She is alert and oriented to person, place, and time. She has normal reflexes.  Skin: Skin is warm and dry.  Psychiatric: She has a normal mood and affect. Her behavior is normal. Judgment and thought  content normal.   BP 117/64 mmHg  Pulse 71  Temp(Src) 97.9 F (36.6 C) (Oral)  Ht 5' 2"  (1.575 m)  Wt 122 lb (55.339 kg)  BMI 22.31 kg/m2      Assessment & Plan:  1. Essential hypertension Do not add salt to diet - CMP14+EGFR - Lipid panel  2. COPD with chronic bronchitis ([place]) Avoid cigarette smoke O2 via nasal cannulla  3. Pulmonary emphysema, unspecified emphysema type ([employer])  4. Hypoxemia  5. Hypokalemia    Labs pending Health maintenance reviewed Diet and exercise encouraged Continue all meds Follow up  In 3 months   [place], FNP

## 2015-10-01 NOTE — Patient Instructions (Signed)
Health Maintenance, Female Adopting a healthy lifestyle and getting preventive care can go a long way to promote health and wellness. Talk with your health care provider about what schedule of regular examinations is right for you. This is a good chance for you to check in with your provider about disease prevention and staying healthy. In between checkups, there are plenty of things you can do on your own. Experts have done a lot of research about which lifestyle changes and preventive measures are most likely to keep you healthy. Ask your health care provider for more information. WEIGHT AND DIET  Eat a healthy diet  Be sure to include plenty of vegetables, fruits, low-fat dairy products, and lean protein.  Do not eat a lot of foods high in solid fats, added sugars, or salt.  Get regular exercise. This is one of the most important things you can do for your health.  Most adults should exercise for at least 150 minutes each week. The exercise should increase your heart rate and make you sweat (moderate-intensity exercise).  Most adults should also do strengthening exercises at least twice a week. This is in addition to the moderate-intensity exercise.  Maintain a healthy weight  Body mass index (BMI) is a measurement that can be used to identify possible weight problems. It estimates body fat based on height and weight. Your health care provider can help determine your BMI and help you achieve or maintain a healthy weight.  For females 20 years of age and older:   A BMI below 18.5 is considered underweight.  A BMI of 18.5 to 24.9 is normal.  A BMI of 25 to 29.9 is considered overweight.  A BMI of 30 and above is considered obese.  Watch levels of cholesterol and blood lipids  You should start having your blood tested for lipids and cholesterol at 79 years of age, then have this test every 5 years.  You may need to have your cholesterol levels checked more often if:  Your lipid  or cholesterol levels are high.  You are older than 79 years of age.  You are at high risk for heart disease.  CANCER SCREENING   Lung Cancer  Lung cancer screening is recommended for adults 55-80 years old who are at high risk for lung cancer because of a history of smoking.  A yearly low-dose CT scan of the lungs is recommended for people who:  Currently smoke.  Have quit within the past 15 years.  Have at least a 30-pack-year history of smoking. A pack year is smoking an average of one pack of cigarettes a day for 1 year.  Yearly screening should continue until it has been 15 years since you quit.  Yearly screening should stop if you develop a health problem that would prevent you from having lung cancer treatment.  Breast Cancer  Practice breast self-awareness. This means understanding how your breasts normally appear and feel.  It also means doing regular breast self-exams. Let your health care provider know about any changes, no matter how small.  If you are in your 20s or 30s, you should have a clinical breast exam (CBE) by a health care provider every 1-3 years as part of a regular health exam.  If you are 40 or older, have a CBE every year. Also consider having a breast X-ray (mammogram) every year.  If you have a family history of breast cancer, talk to your health care provider about genetic screening.  If you   are at high risk for breast cancer, talk to your health care provider about having an MRI and a mammogram every year.  Breast cancer gene (BRCA) assessment is recommended for women who have family members with BRCA-related cancers. BRCA-related cancers include:  Breast.  Ovarian.  Tubal.  Peritoneal cancers.  Results of the assessment will determine the need for genetic counseling and BRCA1 and BRCA2 testing. Cervical Cancer Your health care provider may recommend that you be screened regularly for cancer of the pelvic organs (ovaries, uterus, and  vagina). This screening involves a pelvic examination, including checking for microscopic changes to the surface of your cervix (Pap test). You may be encouraged to have this screening done every 3 years, beginning at age 21.  For women ages 30-65, health care providers may recommend pelvic exams and Pap testing every 3 years, or they may recommend the Pap and pelvic exam, combined with testing for human papilloma virus (HPV), every 5 years. Some types of HPV increase your risk of cervical cancer. Testing for HPV may also be done on women of any age with unclear Pap test results.  Other health care providers may not recommend any screening for nonpregnant women who are considered low risk for pelvic cancer and who do not have symptoms. Ask your health care provider if a screening pelvic exam is right for you.  If you have had past treatment for cervical cancer or a condition that could lead to cancer, you need Pap tests and screening for cancer for at least 20 years after your treatment. If Pap tests have been discontinued, your risk factors (such as having a new sexual partner) need to be reassessed to determine if screening should resume. Some women have medical problems that increase the chance of getting cervical cancer. In these cases, your health care provider may recommend more frequent screening and Pap tests. Colorectal Cancer  This type of cancer can be detected and often prevented.  Routine colorectal cancer screening usually begins at 79 years of age and continues through 79 years of age.  Your health care provider may recommend screening at an earlier age if you have risk factors for colon cancer.  Your health care provider may also recommend using home test kits to check for hidden blood in the stool.  A small camera at the end of a tube can be used to examine your colon directly (sigmoidoscopy or colonoscopy). This is done to check for the earliest forms of colorectal  cancer.  Routine screening usually begins at age 50.  Direct examination of the colon should be repeated every 5-10 years through 79 years of age. However, you may need to be screened more often if early forms of precancerous polyps or small growths are found. Skin Cancer  Check your skin from head to toe regularly.  Tell your health care provider about any new moles or changes in moles, especially if there is a change in a mole's shape or color.  Also tell your health care provider if you have a mole that is larger than the size of a pencil eraser.  Always use sunscreen. Apply sunscreen liberally and repeatedly throughout the day.  Protect yourself by wearing long sleeves, pants, a wide-brimmed hat, and sunglasses whenever you are outside. HEART DISEASE, DIABETES, AND HIGH BLOOD PRESSURE   High blood pressure causes heart disease and increases the risk of stroke. High blood pressure is more likely to develop in:  People who have blood pressure in the high end   of the normal range (130-139/85-89 mm Hg).  People who are overweight or obese.  People who are African American.  If you are 38-23 years of age, have your blood pressure checked every 3-5 years. If you are 61 years of age or older, have your blood pressure checked every year. You should have your blood pressure measured twice--once when you are at a hospital or clinic, and once when you are not at a hospital or clinic. Record the average of the two measurements. To check your blood pressure when you are not at a hospital or clinic, you can use:  An automated blood pressure machine at a pharmacy.  A home blood pressure monitor.  If you are between 45 years and 39 years old, ask your health care provider if you should take aspirin to prevent strokes.  Have regular diabetes screenings. This involves taking a blood sample to check your fasting blood sugar level.  If you are at a normal weight and have a low risk for diabetes,  have this test once every three years after 79 years of age.  If you are overweight and have a high risk for diabetes, consider being tested at a younger age or more often. PREVENTING INFECTION  Hepatitis B  If you have a higher risk for hepatitis B, you should be screened for this virus. You are considered at high risk for hepatitis B if:  You were born in a country where hepatitis B is common. Ask your health care provider which countries are considered high risk.  Your parents were born in a high-risk country, and you have not been immunized against hepatitis B (hepatitis B vaccine).  You have HIV or AIDS.  You use needles to inject street drugs.  You live with someone who has hepatitis B.  You have had sex with someone who has hepatitis B.  You get hemodialysis treatment.  You take certain medicines for conditions, including cancer, organ transplantation, and autoimmune conditions. Hepatitis C  Blood testing is recommended for:  Everyone born from [birth year] through 1963.  Anyone with known risk factors for hepatitis C. Sexually transmitted infections (STIs)  You should be screened for sexually transmitted infections (STIs) including gonorrhea and chlamydia if:  You are sexually active and are younger than 79 years of age.  You are older than 79 years of age and your health care provider tells you that you are at risk for this type of infection.  Your sexual activity has changed since you were last screened and you are at an increased risk for chlamydia or gonorrhea. Ask your health care provider if you are at risk.  If you do not have HIV, but are at risk, it may be recommended that you take a prescription medicine daily to prevent HIV infection. This is called pre-exposure prophylaxis (PrEP). You are considered at risk if:  You are sexually active and do not regularly use condoms or know the HIV status of your partner(s).  You take drugs by injection.  You are sexually  active with a partner who has HIV. Talk with your health care provider about whether you are at high risk of being infected with HIV. If you choose to begin PrEP, you should first be tested for HIV. You should then be tested every 3 months for as long as you are taking PrEP.  PREGNANCY   If you are premenopausal and you may become pregnant, ask your health care provider about preconception counseling.  If you may  become pregnant, take 400 to 800 micrograms (mcg) of folic acid every day.  If you want to prevent pregnancy, talk to your health care provider about birth control (contraception). OSTEOPOROSIS AND MENOPAUSE   Osteoporosis is a disease in which the bones lose minerals and strength with aging. This can result in serious bone fractures. Your risk for osteoporosis can be identified using a bone density scan.  If you are 61 years of age or older, or if you are at risk for osteoporosis and fractures, ask your health care provider if you should be screened.  Ask your health care provider whether you should take a calcium or vitamin D supplement to lower your risk for osteoporosis.  Menopause may have certain physical symptoms and risks.  Hormone replacement therapy may reduce some of these symptoms and risks. Talk to your health care provider about whether hormone replacement therapy is right for you.  HOME CARE INSTRUCTIONS   Schedule regular health, dental, and eye exams.  Stay current with your immunizations.   Do not use any tobacco products including cigarettes, chewing tobacco, or electronic cigarettes.  If you are pregnant, do not drink alcohol.  If you are breastfeeding, limit how much and how often you drink alcohol.  Limit alcohol intake to no more than 1 drink per day for nonpregnant women. One drink equals 12 ounces of beer, 5 ounces of wine, or 1 ounces of hard liquor.  Do not use street drugs.  Do not share needles.  Ask your health care provider for help if  you need support or information about quitting drugs.  Tell your health care provider if you often feel depressed.  Tell your health care provider if you have ever been abused or do not feel safe at home.   This information is not intended to replace advice given to you by your health care provider. Make sure you discuss any questions you have with your health care provider.   Document Released: 06/12/2011 Document Revised: 12/18/2014 Document Reviewed: 10/29/2013 Elsevier Interactive Patient Education [employer].

## 2015-10-02 LAB — LIPID PANEL
Chol/HDL Ratio: 2.7 ratio (ref 0.0–4.4)
Cholesterol, Total: 227 mg/dL — ABNORMAL HIGH (ref 100–199)
HDL: 83 mg/dL
LDL Calculated: 124 mg/dL — ABNORMAL HIGH (ref 0–99)
Triglycerides: 101 mg/dL (ref 0–149)
VLDL Cholesterol Cal: 20 mg/dL (ref 5–40)

## 2015-10-02 LAB — CMP14+EGFR
A/G RATIO: 2.1 (ref 1.1–2.5)
ALK PHOS: 76 IU/L (ref 39–117)
ALT: 6 IU/L (ref 0–32)
AST: 18 IU/L (ref 0–40)
Albumin: 4.1 g/dL (ref 3.5–4.8)
BUN / CREAT RATIO: 14 (ref 11–26)
BUN: 7 mg/dL — ABNORMAL LOW (ref 8–27)
Bilirubin Total: 0.2 mg/dL (ref 0.0–1.2)
CHLORIDE: 95 mmol/L — AB (ref 97–106)
CO2: 32 mmol/L — ABNORMAL HIGH (ref 18–29)
Calcium: 9.8 mg/dL (ref 8.7–10.3)
Creatinine, Ser: 0.49 mg/dL — ABNORMAL LOW (ref 0.57–1.00)
GFR calc Af Amer: 107 mL/min/{1.73_m2} (ref >59)
GFR calc non Af Amer: 93 mL/min/{1.73_m2} (ref >59)
GLOBULIN, TOTAL: 2 g/dL (ref 1.5–4.5)
GLUCOSE: 82 mg/dL (ref 65–99)
POTASSIUM: 4.8 mmol/L (ref 3.5–5.2)
SODIUM: 139 mmol/L (ref 136–144)
Total Protein: 6.1 g/dL (ref 6.0–8.5)

## 2015-10-05 ENCOUNTER — Telehealth: Payer: Self-pay | Admitting: Nurse Practitioner

## 2015-10-18 ENCOUNTER — Other Ambulatory Visit: Payer: Self-pay | Admitting: Nurse Practitioner

## 2015-11-09 ENCOUNTER — Other Ambulatory Visit: Payer: Self-pay | Admitting: Nurse Practitioner

## 2015-11-29 ENCOUNTER — Other Ambulatory Visit: Payer: Self-pay | Admitting: Nurse Practitioner

## 2015-12-08 ENCOUNTER — Other Ambulatory Visit: Payer: Self-pay | Admitting: Family Medicine

## 2015-12-08 ENCOUNTER — Other Ambulatory Visit: Payer: Self-pay | Admitting: Nurse Practitioner

## 2016-01-05 ENCOUNTER — Other Ambulatory Visit: Payer: Self-pay | Admitting: Nurse Practitioner

## 2016-01-06 NOTE — Telephone Encounter (Signed)
Last seen 10/01/15  MMM

## 2016-01-07 ENCOUNTER — Ambulatory Visit (INDEPENDENT_AMBULATORY_CARE_PROVIDER_SITE_OTHER): Payer: Medicare Other | Admitting: Nurse Practitioner

## 2016-01-07 ENCOUNTER — Encounter: Payer: Self-pay | Admitting: Nurse Practitioner

## 2016-01-07 VITALS — BP 154/84 | HR 69 | Temp 97.2°F | Ht 62.0 in | Wt 122.0 lb

## 2016-01-07 DIAGNOSIS — M545 Low back pain, unspecified: Secondary | ICD-10-CM

## 2016-01-07 DIAGNOSIS — J449 Chronic obstructive pulmonary disease, unspecified: Secondary | ICD-10-CM

## 2016-01-07 DIAGNOSIS — L989 Disorder of the skin and subcutaneous tissue, unspecified: Secondary | ICD-10-CM

## 2016-01-07 DIAGNOSIS — E876 Hypokalemia: Secondary | ICD-10-CM

## 2016-01-07 DIAGNOSIS — I10 Essential (primary) hypertension: Secondary | ICD-10-CM | POA: Diagnosis not present

## 2016-01-07 MED ORDER — FUROSEMIDE 20 MG PO TABS: ORAL_TABLET | ORAL | Status: DC

## 2016-01-07 MED ORDER — LISINOPRIL 40 MG PO TABS: ORAL_TABLET | ORAL | Status: DC

## 2016-01-07 MED ORDER — POTASSIUM CHLORIDE CRYS ER 20 MEQ PO TBCR: 20.0000 meq | EXTENDED_RELEASE_TABLET | Freq: Every day | ORAL | Status: DC

## 2016-01-07 NOTE — Progress Notes (Signed)
Subjective:    Patient ID: Tiffany Maxwell, female    DOB: 02-21-1934, 82 y.o.   MRN: [id number]   Patient here today for follow up of chronic medical problems.  Outpatient Encounter Prescriptions as of 01/07/2016  Medication Sig  . aspirin EC 81 MG tablet Take 81 mg by mouth daily.  . benzonatate (TESSALON) 100 MG capsule TAKE 1 CAPSULE TWICE DAILY AS NEEDED FOR COUGH  . Calcium Carbonate-Vitamin D (CALCIUM 600+D3) 600-400 MG-UNIT per tablet Take 1 tablet by mouth 2 (two) times daily. (Patient taking differently: Take 1 tablet by mouth daily. )  . COMBIVENT RESPIMAT 20-100 MCG/ACT AERS respimat 2 PUFFS EVERY 6 HOURS AS NEEDED FOR WHEEZING. NO MORE THAN 6 PUFFS PER DAY  . furosemide (LASIX) 20 MG tablet TAKE (1) TABLET DAILY FOR (EXCESSIVE) FLUID.  Marland Kitchen lisinopril (PRINIVIL,ZESTRIL) 40 MG tablet TAKE 1 TABLET DAILY  . polyethylene glycol powder (GLYCOLAX/MIRALAX) powder 17 GRAMS (1 CAPFUL) ONCE A DAY AS DIRECTED  . potassium chloride SA (K-DUR,KLOR-CON) 20 MEQ tablet Take 1 tablet (20 mEq total) by mouth daily.  . VENTOLIN HFA 108 (90 Base) MCG/ACT inhaler 2 PUFFS EVERY 6 HOURS AS NEEDED FOR WHEEZING OR SHORTNESS OF BREATH   No facility-administered encounter medications on file as of 01/07/2016.     Hypertension This is a chronic problem. The current episode started more than 1 year ago. The problem is unchanged. The problem is controlled. Pertinent negatives include no chest pain, headaches, neck pain, palpitations or shortness of breath. Risk factors for coronary artery disease include post-menopausal state. Past treatments include ACE inhibitors and diuretics. The current treatment provides moderate improvement. Compliance problems include diet and exercise.   COPD- Hypoxemia Patient has a long history of COPD and is on continuous oxygen at home- The only inhaler she currently uses is combivent. SHe says that she has been doing well as long as she stays on her oxygen. Hypokalemia Patient  is currently on kdur- denies any cramping of lower ext. Hyponatremia Patient just adds a little Na to diet 1-2X a week- no c/o fatigue. Chronic back pain/compression fractures of back norco on an as needed basis- usually takes at least 1x a day- walking with a walker due to back pain.  * Scalp lesion- has been there for awhile- has gotten some bigger  Review of Systems  Constitutional: Negative.   HENT: Negative.   Respiratory: Negative for shortness of breath.   Cardiovascular: Negative for chest pain and palpitations.  Genitourinary: Negative.   Musculoskeletal: Negative for neck pain.  Neurological: Negative for headaches.  Psychiatric/Behavioral: Negative.   All other systems reviewed and are negative.  Objective:   Physical Exam  Constitutional: She is oriented to person, place, and time. She appears well-developed and well-nourished.  HENT:  Nose: Nose normal.  Mouth/Throat: Oropharynx is clear and moist.  Eyes: EOM are normal.  Neck: Trachea normal, normal range of motion and full passive range of motion without pain. Neck supple. No JVD present. Carotid bruit is not present. No thyromegaly present.  Cardiovascular: Normal rate, regular rhythm, normal heart sounds and intact distal pulses.  Exam reveals no gallop and no friction rub.   No murmur heard. Pulmonary/Chest: Effort normal.  O2 via nasal cannnulla Diminished breath sounds throughout lung fields  Abdominal: Soft. Bowel sounds are normal. She exhibits no distension and no mass. There is no tenderness.  Musculoskeletal: Normal range of motion.  Walking with a walker  Lymphadenopathy:    She has no cervical adenopathy.  Neurological: She is alert and oriented to person, place, and time. She has normal reflexes.  Skin: Skin is warm and dry.  greyish brown raised lesion  left scalp  Psychiatric: She has a normal mood and affect. Her behavior is normal. Judgment and thought content normal.   BP 154/84 mmHg  Pulse 69  Temp(Src) 97.2 F (36.2 C) (Oral)  Ht _0  (1.575 m)  Wt 122 lb (55.339 kg)  BMI 22.31 kg/m2  SpO2 94%        Assessment & Plan:  1. Essential hypertension Do not add salt to diet - lisinopril (PRINIVIL,ZESTRIL) 40 MG tablet; TAKE 1 TABLET DAILY  Dispense: 30 tablet; Refill: 5 - furosemide (LASIX) 20 MG tablet; TAKE (1) TABLET DAILY FOR (EXCESSIVE) FLUID.  Dispense: 30 tablet; Refill: 5 - CMP14+EGFR - Lipid panel  2. COPD with chronic bronchitis ([place]) Avoid cigarette smoke  3. Hypokalemia - potassium chloride SA (K-DUR,KLOR-CON) 20 MEQ tablet; Take 1 tablet (20 mEq total) by mouth daily.  Dispense: 30 tablet; Refill: 5  4. Midline low back pain without sciatica  5. Scalp lesion Will return for removal  Labs pending Health maintenance reviewed Diet and exercise encouraged Continue all meds Follow up  In 3 months   Olga, FNP

## 2016-01-07 NOTE — Patient Instructions (Signed)
Bone Health Bones protect organs, store calcium, and anchor muscles. Good health habits, such as eating nutritious foods and exercising regularly, are important for maintaining healthy bones. They can also help to prevent a condition that causes bones to lose density and become weak and brittle (osteoporosis). WHY IS BONE MASS IMPORTANT? Bone mass refers to the amount of bone tissue that you have. The higher your bone mass, the stronger your bones. An important step toward having healthy bones throughout life is to have strong and dense bones during childhood. A young adult who has a high bone mass is more likely to have a high bone mass later in life. Bone mass at its greatest it is called peak bone mass. A large decline in bone mass occurs in older adults. In women, it occurs about the time of menopause. During this time, it is important to practice good health habits, because if more bone is lost than what is replaced, the bones will become less healthy and more likely to break (fracture). If you find that you have a low bone mass, you may be able to prevent osteoporosis or further bone loss by changing your diet and lifestyle. HOW CAN I FIND OUT IF MY BONE MASS IS LOW? Bone mass can be measured with an X-ray test that is called a bone mineral density (BMD) test. This test is recommended for all women who are age 65 or older. It may also be recommended for men who are age 70 or older, or for people who are more likely to develop osteoporosis due to:  Having bones that break easily.  Having a long-term disease that weakens bones, such as kidney disease or rheumatoid arthritis.  Having menopause earlier than normal.  Taking medicine that weakens bones, such as steroids, thyroid hormones, or hormone treatment for breast cancer or prostate cancer.  Smoking.  Drinking three or more alcoholic drinks each day. WHAT ARE THE NUTRITIONAL RECOMMENDATIONS FOR HEALTHY BONES? To have healthy bones, you need  to get enough of the right minerals and vitamins. Most nutrition experts recommend getting these nutrients from the foods that you eat. Nutritional recommendations vary from person to person. Ask your health care provider what is healthy for you. Here are some general guidelines. Calcium Recommendations Calcium is the most important (essential) mineral for bone health. Most people can get enough calcium from their diet, but supplements may be recommended for people who are at risk for osteoporosis. Good sources of calcium include:  Dairy products, such as low-fat or nonfat milk, cheese, and yogurt.  Dark green leafy vegetables, such as bok choy and broccoli.  Calcium-fortified foods, such as orange juice, cereal, bread, soy beverages, and tofu products.  Nuts, such as almonds. Follow these recommended amounts for daily calcium intake:  Children, age 1-3: 700 mg.  Children, age 4-8: 1,000 mg.  Children, age 9-13: 1,300 mg.  Teens, age 14-18: 1,300 mg.  Adults, age 19-50: 1,000 mg.  Adults, age 51-70:  Men: 1,000 mg.  Women: 1,200 mg.  Adults, age 71 or older: 1,200 mg.  Pregnant and breastfeeding females:  Teens: 1,300 mg.  Adults: 1,000 mg. Vitamin D Recommendations Vitamin D is the most essential vitamin for bone health. It helps the body to absorb calcium. Sunlight stimulates the skin to make vitamin D, so be sure to get enough sunlight. If you live in a cold climate or you do not get outside often, your health care provider may recommend that you take vitamin D supplements. Good   sources of vitamin D in your diet include:  Egg yolks.  Saltwater fish.  Milk and cereal fortified with vitamin D. Follow these recommended amounts for daily vitamin D intake:  Children and teens, age 1-18: 600 international units.  Adults, age 50 or younger: 400-800 international units.  Adults, age 51 or older: 800-1,000 international units. Other Nutrients Other nutrients for bone  health include:  Phosphorus. This mineral is found in meat, poultry, dairy foods, nuts, and legumes. The recommended daily intake for adult men and adult women is 700 mg.  Magnesium. This mineral is found in seeds, nuts, dark green vegetables, and legumes. The recommended daily intake for adult men is 400-420 mg. For adult women, it is 310-320 mg.  Vitamin K. This vitamin is found in green leafy vegetables. The recommended daily intake is 120 mg for adult men and 90 mg for adult women. WHAT TYPE OF PHYSICAL ACTIVITY IS BEST FOR BUILDING AND MAINTAINING HEALTHY BONES? Weight-bearing and strength-building activities are important for building and maintaining peak bone mass. Weight-bearing activities cause muscles and bones to work against gravity. Strength-building activities increases muscle strength that supports bones. Weight-bearing and muscle-building activities include:  Walking and hiking.  Jogging and running.  Dancing.  Gym exercises.  Lifting weights.  Tennis and racquetball.  Climbing stairs.  Aerobics. Adults should get at least 30 minutes of moderate physical activity on most days. Children should get at least 60 minutes of moderate physical activity on most days. Ask your health care provide what type of exercise is best for you. WHERE CAN I FIND MORE INFORMATION? For more information, check out the following websites:  National Osteoporosis Foundation: http://nof.org/learn/basics  National Institutes of Health: http://www.niams.nih.gov/Health_Info/Bone/Bone_Health/bone_health_for_life.asp   This information is not intended to replace advice given to you by your health care provider. Make sure you discuss any questions you have with your health care provider.   Document Released: 02/17/2004 Document Revised: 04/13/2015 Document Reviewed: 12/02/2014 Elsevier Interactive Patient Education 2016 [employer]  

## 2016-01-08 LAB — CMP14+EGFR
A/G RATIO: 1.8 (ref 1.1–2.5)
ALBUMIN: 4.1 g/dL (ref 3.5–4.8)
ALT: 7 IU/L (ref 0–32)
AST: 18 IU/L (ref 0–40)
Alkaline Phosphatase: 79 IU/L (ref 39–117)
BUN / CREAT RATIO: 11 (ref 11–26)
BUN: 5 mg/dL — ABNORMAL LOW (ref 8–27)
Bilirubin Total: 0.2 mg/dL (ref 0.0–1.2)
CALCIUM: 9.6 mg/dL (ref 8.7–10.3)
CO2: 29 mmol/L (ref 18–29)
Chloride: 96 mmol/L (ref 96–106)
Creatinine, Ser: 0.47 mg/dL — ABNORMAL LOW (ref 0.57–1.00)
GFR calc Af Amer: 109 mL/min/{1.73_m2} (ref >59)
GFR, EST NON AFRICAN AMERICAN: 94 mL/min/{1.73_m2} (ref >59)
GLOBULIN, TOTAL: 2.3 g/dL (ref 1.5–4.5)
Glucose: 82 mg/dL (ref 65–99)
POTASSIUM: 4.5 mmol/L (ref 3.5–5.2)
SODIUM: 139 mmol/L (ref 134–144)
Total Protein: 6.4 g/dL (ref 6.0–8.5)

## 2016-01-08 LAB — LIPID PANEL
CHOL/HDL RATIO: 2.9 ratio (ref 0.0–4.4)
Cholesterol, Total: 251 mg/dL — ABNORMAL HIGH (ref 100–199)
HDL: 86 mg/dL (ref >39)
LDL CALC: 138 mg/dL — AB (ref 0–99)
TRIGLYCERIDES: 133 mg/dL (ref 0–149)
VLDL Cholesterol Cal: 27 mg/dL (ref 5–40)

## 2016-01-24 ENCOUNTER — Other Ambulatory Visit: Payer: Self-pay | Admitting: Nurse Practitioner

## 2016-01-25 NOTE — Telephone Encounter (Signed)
Tessalon perles sent to pharmacy.

## 2016-01-31 ENCOUNTER — Telehealth: Payer: Self-pay | Admitting: Nurse Practitioner

## 2016-01-31 NOTE — Telephone Encounter (Signed)
Pt aware we don't carry her inhaler samples.

## 2016-02-15 ENCOUNTER — Other Ambulatory Visit: Payer: Self-pay | Admitting: Nurse Practitioner

## 2016-02-25 ENCOUNTER — Encounter (HOSPITAL_COMMUNITY): Payer: Self-pay | Admitting: Emergency Medicine

## 2016-02-25 ENCOUNTER — Emergency Department (HOSPITAL_COMMUNITY): Payer: Medicare Other

## 2016-02-25 ENCOUNTER — Emergency Department (HOSPITAL_COMMUNITY)
Admission: EM | Admit: 2016-02-25 | Discharge: 2016-02-25 | Disposition: A | Discharge status: Home / Self Care | Payer: Medicare Other | Attending: Emergency Medicine | Admitting: Emergency Medicine

## 2016-02-25 DIAGNOSIS — R0682 Tachypnea, not elsewhere classified: Secondary | ICD-10-CM | POA: Diagnosis not present

## 2016-02-25 DIAGNOSIS — I10 Essential (primary) hypertension: Secondary | ICD-10-CM | POA: Insufficient documentation

## 2016-02-25 DIAGNOSIS — I252 Old myocardial infarction: Secondary | ICD-10-CM | POA: Diagnosis not present

## 2016-02-25 DIAGNOSIS — Z7982 Long term (current) use of aspirin: Secondary | ICD-10-CM | POA: Insufficient documentation

## 2016-02-25 DIAGNOSIS — E785 Hyperlipidemia, unspecified: Secondary | ICD-10-CM | POA: Insufficient documentation

## 2016-02-25 DIAGNOSIS — Z87891 Personal history of nicotine dependence: Secondary | ICD-10-CM | POA: Diagnosis not present

## 2016-02-25 DIAGNOSIS — R0902 Hypoxemia: Secondary | ICD-10-CM | POA: Diagnosis not present

## 2016-02-25 DIAGNOSIS — R0602 Shortness of breath: Secondary | ICD-10-CM | POA: Diagnosis not present

## 2016-02-25 DIAGNOSIS — Z79899 Other long term (current) drug therapy: Secondary | ICD-10-CM | POA: Diagnosis not present

## 2016-02-25 DIAGNOSIS — I110 Hypertensive heart disease with heart failure: Secondary | ICD-10-CM | POA: Diagnosis not present

## 2016-02-25 DIAGNOSIS — I5033 Acute on chronic diastolic (congestive) heart failure: Secondary | ICD-10-CM

## 2016-02-25 DIAGNOSIS — F419 Anxiety disorder, unspecified: Secondary | ICD-10-CM | POA: Insufficient documentation

## 2016-02-25 DIAGNOSIS — J441 Chronic obstructive pulmonary disease with (acute) exacerbation: Secondary | ICD-10-CM

## 2016-02-25 DIAGNOSIS — R062 Wheezing: Secondary | ICD-10-CM | POA: Diagnosis not present

## 2016-02-25 LAB — BASIC METABOLIC PANEL
ANION GAP: 6 (ref 5–15)
BUN: 8 mg/dL (ref 6–20)
CALCIUM: 9.1 mg/dL (ref 8.9–10.3)
CO2: 35 mmol/L — ABNORMAL HIGH (ref 22–32)
Chloride: 92 mmol/L — ABNORMAL LOW (ref 101–111)
Creatinine, Ser: 0.4 mg/dL — ABNORMAL LOW (ref 0.44–1.00)
Glucose, Bld: 128 mg/dL — ABNORMAL HIGH (ref 65–99)
POTASSIUM: 4 mmol/L (ref 3.5–5.1)
SODIUM: 133 mmol/L — AB (ref 135–145)

## 2016-02-25 LAB — CBC WITH DIFFERENTIAL/PLATELET
BASOS ABS: 0 10*3/uL (ref 0.0–0.1)
BASOS PCT: 1 %
EOS PCT: 10 %
Eosinophils Absolute: 0.8 10*3/uL — ABNORMAL HIGH (ref 0.0–0.7)
HCT: 38 % (ref 36.0–46.0)
Hemoglobin: 12.5 g/dL (ref 12.0–15.0)
Lymphocytes Relative: 29 %
Lymphs Abs: 2.3 10*3/uL (ref 0.7–4.0)
MCH: 29.6 pg (ref 26.0–34.0)
MCHC: 32.9 g/dL (ref 30.0–36.0)
MCV: 89.8 fL (ref 78.0–100.0)
MONO ABS: 0.8 10*3/uL (ref 0.1–1.0)
Monocytes Relative: 10 %
NEUTROS ABS: 4 10*3/uL (ref 1.7–7.7)
Neutrophils Relative %: 50 %
PLATELETS: 210 10*3/uL (ref 150–400)
RBC: 4.23 MIL/uL (ref 3.87–5.11)
RDW: 12 % (ref 11.5–15.5)
WBC: 7.9 10*3/uL (ref 4.0–10.5)

## 2016-02-25 LAB — TROPONIN I

## 2016-02-25 LAB — BRAIN NATRIURETIC PEPTIDE: B NATRIURETIC PEPTIDE 5: 22 pg/mL (ref 0.0–100.0)

## 2016-02-25 MED ORDER — FUROSEMIDE 10 MG/ML IJ SOLN
40.0000 mg | Freq: Once | INTRAMUSCULAR | Status: AC
  Administered 2016-02-25: 40 mg via INTRAVENOUS
  Filled 2016-02-25: qty 4

## 2016-02-25 MED ORDER — AZITHROMYCIN 250 MG PO TABS: 250.0000 mg | ORAL_TABLET | Freq: Every day | ORAL | Status: DC

## 2016-02-25 MED ORDER — ALBUTEROL (5 MG/ML) CONTINUOUS INHALATION SOLN
INHALATION_SOLUTION | RESPIRATORY_TRACT | Status: AC
  Administered 2016-02-25: 10 mg
  Filled 2016-02-25: qty 20

## 2016-02-25 MED ORDER — PREDNISONE 20 MG PO TABS: 60.0000 mg | ORAL_TABLET | Freq: Every day | ORAL | Status: DC

## 2016-02-25 NOTE — ED Provider Notes (Signed)
CSN: [id number]     Arrival date & time 02/25/16  0118 History   First MD Initiated Contact with Patient 02/25/16 0121     Chief Complaint  Patient presents with  . Shortness of Breath  . Anxiety     (Consider location/radiation/quality/duration/timing/severity/associated sxs/prior Treatment) HPI Comments: Patient presents to the emergency department for evaluation of difficulty breathing. Symptoms began this evening. Patient does have a history of COPD as well as congestive heart failure. EMS report significant wheezing upon their arrival. This has improved with Solu-Medrol and DuoNeb treatment. Patient agrees that she is feeling better at arrival to the ER, now feeling some anxiety. She has not experiencing any chest pain.  Patient is a 82 y.o. female presenting with shortness of breath and anxiety.  Shortness of Breath Associated symptoms: wheezing   Anxiety Associated symptoms include shortness of breath.    Past Medical History  Diagnosis Date  . Osteoporosis   . Palpitations   . PAC (premature atrial contraction)   . Seborrheic keratosis   . Hypertension   . Hyperlipidemia   . COPD (chronic obstructive pulmonary disease) ([place])   . MI (myocardial infarction) ([place])     pt unaware  . Multiple fractures of thoracic spine, closed ([place]) 01/30/2013  . Cholelithiasis 01/30/2013  . Pulmonary emphysema ([place]) 01/30/2013  . History of tobacco use 01/30/2013  . Diastolic dysfunction [id number]    Grade 1. Ejection fraction 65%.  . Compression fracture of lumbar spine, non-traumatic 04/09/2014   Past Surgical History  Procedure Laterality Date  . Tubal ligation    . Compression hip screw Left 04/29/2013    Procedure: COMPRESSION HIP;  Surgeon: Sanjuana Kava, MD;  Location: [facility];  Service: Orthopedics;  Laterality: Left;  [employer]   Family History  Problem Relation Age of Onset  . Hypertension Mother    Social History  Substance Use Topics  . Smoking status: Former Smoker  -- 1.00 packs/day for 40 years    Types: Cigarettes    Quit date: 07/10/2012  . Smokeless tobacco: Former Systems developer  . Alcohol Use: No   OB History    No data available     Review of Systems  Respiratory: Positive for shortness of breath and wheezing.   Psychiatric/Behavioral: The patient is nervous/anxious.   All other systems reviewed and are negative.     Allergies  Codeine; Benicar hct; and Naproxen  Home Medications   Prior to Admission medications   Medication Sig Start Date End Date Taking? Authorizing Provider  aspirin EC 81 MG tablet Take 81 mg by mouth daily.   Yes Historical Provider, MD  benzonatate (TESSALON) 100 MG capsule TAKE 1 CAPSULE TWICE DAILY AS NEEDED FOR COUGH 02/17/16  Yes Mary-Margaret Hassell Done, FNP  Calcium Carbonate-Vitamin D (CALCIUM 600+D3) 600-400 MG-UNIT per tablet Take 1 tablet by mouth 2 (two) times daily. Patient taking differently: Take 1 tablet by mouth daily.  04/09/14  Yes Rexene Alberts, MD  COMBIVENT RESPIMAT 20-100 MCG/ACT AERS respimat 2 PUFFS EVERY 6 HOURS AS NEEDED FOR WHEEZING. NO MORE THAN 6 PUFFS PER DAY 12/09/15  Yes Mary-Margaret Hassell Done, FNP  furosemide (LASIX) 20 MG tablet TAKE (1) TABLET DAILY FOR (EXCESSIVE) FLUID. 01/07/16  Yes Mary-Margaret Hassell Done, FNP  lisinopril (PRINIVIL,ZESTRIL) 40 MG tablet TAKE 1 TABLET DAILY 01/07/16  Yes Mary-Margaret Hassell Done, FNP  polyethylene glycol powder (GLYCOLAX/MIRALAX) powder 17 GRAMS (1 CAPFUL) ONCE A DAY AS DIRECTED 11/09/15  Yes Mary-Margaret Hassell Done, FNP  potassium chloride SA (K-DUR,KLOR-CON) 20 MEQ tablet  Take 1 tablet (20 mEq total) by mouth daily. 01/07/16  Yes Mary-Margaret Hassell Done, FNP  VENTOLIN HFA 108 (90 Base) MCG/ACT inhaler 2 PUFFS EVERY 6 HOURS AS NEEDED FOR WHEEZING OR SHORTNESS OF BREATH 12/09/15  Yes Mary-Margaret Hassell Done, FNP   BP 109/68 mmHg  Pulse 100  Temp(Src) 98.4 F (36.9 C) (Oral)  Resp 13  Ht 5' (1.524 m)  Wt 122 lb (55.339 kg)  BMI 23.83 kg/m2  SpO2 95% Physical Exam   Constitutional: She is oriented to person, place, and time. She appears well-developed and well-nourished. No distress.  HENT:  Head: Normocephalic and atraumatic.  Right Ear: Hearing normal.  Left Ear: Hearing normal.  Nose: Nose normal.  Mouth/Throat: Oropharynx is clear and moist and mucous membranes are normal.  Eyes: Conjunctivae and EOM are normal. Pupils are equal, round, and reactive to light.  Neck: Normal range of motion. Neck supple.  Cardiovascular: Regular rhythm, S1 normal and S2 normal.  Exam reveals no gallop and no friction rub.   No murmur heard. Pulmonary/Chest: Effort normal. No respiratory distress. She has wheezes. She exhibits no tenderness.  Abdominal: Soft. Normal appearance and bowel sounds are normal. There is no hepatosplenomegaly. There is no tenderness. There is no rebound, no guarding, no tenderness at McBurney's point and negative Murphy's sign. No hernia.  Musculoskeletal: Normal range of motion.  Neurological: She is alert and oriented to person, place, and time. She has normal strength. No cranial nerve deficit or sensory deficit. Coordination normal. GCS eye subscore is 4. GCS verbal subscore is 5. GCS motor subscore is 6.  Skin: Skin is warm, dry and intact. No rash noted. No cyanosis.  Psychiatric: She has a normal mood and affect. Her speech is normal and behavior is normal. Thought content normal.  Nursing note and vitals reviewed.   ED Course  Procedures (including critical care time) Labs Review Labs Reviewed  CBC WITH DIFFERENTIAL/PLATELET - Abnormal; Notable for the following:    Eosinophils Absolute 0.8 (*)    All other components within normal limits  BASIC METABOLIC PANEL - Abnormal; Notable for the following:    Sodium 133 (*)    Chloride 92 (*)    CO2 35 (*)    Glucose, Bld 128 (*)    Creatinine, Ser 0.40 (*)    All other components within normal limits  TROPONIN I  BRAIN NATRIURETIC PEPTIDE    Imaging Review Dg Chest Port 1  View  02/25/2016  CLINICAL DATA:  Acute onset of shortness of breath, wheezing and anxiety. Initial encounter. EXAM: PORTABLE CHEST 1 VIEW COMPARISON:  Chest radiograph performed 11/14/2014 FINDINGS: The lungs are well-aerated. Vascular congestion is noted, with increased interstitial markings, concerning for mild pulmonary edema. There is no evidence of pleural effusion or pneumothorax. The cardiomediastinal silhouette is mildly enlarged. No acute osseous abnormalities are seen. IMPRESSION: Vascular congestion and mild cardiomegaly, with increased interstitial markings, concerning for mild pulmonary edema. Electronically Signed   By: Garald Balding M.D.   On: 02/25/2016 02:19   I have personally reviewed and evaluated these images and lab results as part of my medical decision-making.   EKG Interpretation   Date/Time:  Friday February 25 2016 02:42:32 EDT Ventricular Rate:  96 PR Interval:  156 QRS Duration: 91 QT Interval:  351 QTC Calculation: 443 R Axis:   -6 Text Interpretation:  Sinus rhythm Normal ECG Confirmed by POLLINA  MD,  CHRISTOPHER [id number]) on 02/25/2016 2:45:10 AM      MDM   Final diagnoses:  Acute on chronic diastolic congestive heart failure ([place])  COPD exacerbation ([place])    Patient presents to the emergency department with shortness of breath. Symptoms began this evening. She does have a history of COPD and was noted to have significant wheezing both for EMS and at arrival to the ER. Symptoms have improved with bronchodilator therapy. She does, however, have a history of diastolic heart failure. She takes Lasix when necessary. Chest x-ray does suggest mild volume overload. She has not hypoxic here in the ER. She was diuresed here in the ER and will continue Lasix daily for the next several days at home, follow-up with primary doctor Monday. We'll continue outpatient treatment for COPD with prednisone, albuterol, antibiotic.    Orpah Greek, MD 02/25/16 [phone]

## 2016-02-25 NOTE — ED Notes (Addendum)
Patient starting having difficulty breathing and anxiety tonight, given one neb treatment, patient having expiratory wheezing in all lung fields.  7.5 duneb treatment, 125 mg Solumedrol.

## 2016-02-25 NOTE — Discharge Instructions (Signed)
TAKE LASIX EVERY DAY FOR THE NEXT 3 DAYS. SEE YOUR PRIMARY DOCTOR ON Monday FOR A RECHECK.  Chronic Obstructive Pulmonary Disease Exacerbation Chronic obstructive pulmonary disease (COPD) is a common lung problem. In COPD, the flow of air from the lungs is limited. COPD exacerbations are times that breathing gets worse and you need extra treatment. Without treatment they can be life threatening. If they happen often, your lungs can become more damaged. If your COPD gets worse, your doctor may treat you with:  Medicines.  Oxygen.  Different ways to clear your airway, such as using a mask. HOME CARE  Do not smoke.  Avoid tobacco smoke and other things that bother your lungs.  If given, take your antibiotic medicine as told. Finish the medicine even if you start to feel better.  Only take medicines as told by your doctor.  Drink enough fluids to keep your pee (urine) clear or pale yellow (unless your doctor has told you not to).  Use a cool mist machine (vaporizer).  If you use oxygen or a machine that turns liquid medicine into a mist (nebulizer), continue to use them as told.  Keep up with shots (vaccinations) as told by your doctor.  Exercise regularly.  Eat healthy foods.  Keep all doctor visits as told. GET HELP RIGHT AWAY IF:  You are very short of breath and it gets worse.  You have trouble talking.  You have bad chest pain.  You have blood in your spit (sputum).  You have a fever.  You keep throwing up (vomiting).  You feel weak, or you pass out (faint).  You feel confused.  You keep getting worse. MAKE SURE YOU:  Understand these instructions.  Will watch your condition.  Will get help right away if you are not doing well or get worse.   This information is not intended to replace advice given to you by your health care provider. Make sure you discuss any questions you have with your health care provider.   Document Released: 11/16/2011 Document  Revised: 12/18/2014 Document Reviewed: 08/01/2013 Elsevier Interactive Patient Education 2016 [place].  Heart Failure Heart failure means your heart has trouble pumping blood. This makes it hard for your body to work well. Heart failure is usually a long-term (chronic) condition. You must take good care of yourself and follow your doctor's treatment plan. HOME CARE  Take your heart medicine as told by your doctor.  Do not stop taking medicine unless your doctor tells you to.  Do not skip any dose of medicine.  Refill your medicines before they run out.  Take other medicines only as told by your doctor or pharmacist.  Stay active if told by your doctor. The elderly and people with severe heart failure should talk with a doctor about physical activity.  Eat heart-healthy foods. Choose foods that are without trans fat and are low in saturated fat, cholesterol, and salt (sodium). This includes fresh or frozen fruits and vegetables, fish, lean meats, fat-free or low-fat dairy foods, whole grains, and high-fiber foods. Lentils and dried peas and beans (legumes) are also good choices.  Limit salt if told by your doctor.  Cook in a healthy way. Roast, grill, broil, bake, poach, steam, or stir-fry foods.  Limit fluids as told by your doctor.  Weigh yourself every morning. Do this after you pee (urinate) and before you eat breakfast. Write down your weight to give to your doctor.  Take your blood pressure and write it down  if your doctor tells you to.  Ask your doctor how to check your pulse. Check your pulse as told.  Lose weight if told by your doctor.  Stop smoking or chewing tobacco. Do not use gum or patches that help you quit without your doctor's approval.  Schedule and go to doctor visits as told.  Nonpregnant women should have no more than 1 drink a day. Men should have no more than 2 drinks a day. Talk to your doctor about drinking alcohol.  Stop illegal drug  use.  Stay current with shots (immunizations).  Manage your health conditions as told by your doctor.  Learn to manage your stress.  Rest when you are tired.  If it is really hot outside:  Avoid intense activities.  Use air conditioning or fans, or get in a cooler place.  Avoid caffeine and alcohol.  Wear loose-fitting, lightweight, and light-colored clothing.  If it is really cold outside:  Avoid intense activities.  Layer your clothing.  Wear mittens or gloves, a hat, and a scarf when going outside.  Avoid alcohol.  Learn about heart failure and get support as needed.  Get help to maintain or improve your quality of life and your ability to care for yourself as needed. GET HELP IF:   You gain weight quickly.  You are more short of breath than usual.  You cannot do your normal activities.  You tire easily.  You cough more than normal, especially with activity.  You have any or more puffiness (swelling) in areas such as your hands, feet, ankles, or belly (abdomen).  You cannot sleep because it is hard to breathe.  You feel like your heart is beating fast (palpitations).  You get dizzy or light-headed when you stand up. GET HELP RIGHT AWAY IF:   You have trouble breathing.  There is a change in mental status, such as becoming less alert or not being able to focus.  You have chest pain or discomfort.  You faint. MAKE SURE YOU:   Understand these instructions.  Will watch your condition.  Will get help right away if you are not doing well or get worse.   This information is not intended to replace advice given to you by your health care provider. Make sure you discuss any questions you have with your health care provider.   Document Released: 09/05/2008 Document Revised: 12/18/2014 Document Reviewed: 01/13/2013 Elsevier Interactive Patient Education [employer].

## 2016-02-25 NOTE — ED Notes (Signed)
Patient went home via wheelchair and portable oxygen, with assistance of son, no distress noted.

## 2016-02-29 ENCOUNTER — Other Ambulatory Visit: Payer: Self-pay | Admitting: Nurse Practitioner

## 2016-02-29 NOTE — Telephone Encounter (Signed)
Last seen 01/07/16  MMM

## 2016-03-18 ENCOUNTER — Other Ambulatory Visit: Payer: Self-pay | Admitting: Nurse Practitioner

## 2016-03-24 ENCOUNTER — Other Ambulatory Visit: Payer: Self-pay | Admitting: Nurse Practitioner

## 2016-03-31 ENCOUNTER — Other Ambulatory Visit: Payer: Self-pay | Admitting: Nurse Practitioner

## 2016-04-17 ENCOUNTER — Other Ambulatory Visit: Payer: Self-pay | Admitting: Nurse Practitioner

## 2016-04-24 ENCOUNTER — Encounter: Payer: Self-pay | Admitting: Nurse Practitioner

## 2016-04-24 ENCOUNTER — Ambulatory Visit (INDEPENDENT_AMBULATORY_CARE_PROVIDER_SITE_OTHER): Payer: Medicare Other | Admitting: Nurse Practitioner

## 2016-04-24 VITALS — BP 132/67 | HR 75 | Temp 97.4°F

## 2016-04-24 DIAGNOSIS — R0902 Hypoxemia: Secondary | ICD-10-CM | POA: Diagnosis not present

## 2016-04-24 DIAGNOSIS — J449 Chronic obstructive pulmonary disease, unspecified: Secondary | ICD-10-CM

## 2016-04-24 DIAGNOSIS — F411 Generalized anxiety disorder: Secondary | ICD-10-CM

## 2016-04-24 DIAGNOSIS — J439 Emphysema, unspecified: Secondary | ICD-10-CM | POA: Diagnosis not present

## 2016-04-24 DIAGNOSIS — M545 Low back pain, unspecified: Secondary | ICD-10-CM

## 2016-04-24 DIAGNOSIS — E876 Hypokalemia: Secondary | ICD-10-CM

## 2016-04-24 DIAGNOSIS — I10 Essential (primary) hypertension: Secondary | ICD-10-CM

## 2016-04-24 DIAGNOSIS — M810 Age-related osteoporosis without current pathological fracture: Secondary | ICD-10-CM

## 2016-04-24 LAB — CMP14+EGFR
ALT: 9 IU/L (ref 0–32)
AST: 18 IU/L (ref 0–40)
Albumin/Globulin Ratio: 2.5 — ABNORMAL HIGH (ref 1.2–2.2)
Albumin: 4.2 g/dL (ref 3.5–4.7)
Alkaline Phosphatase: 68 IU/L (ref 39–117)
BUN/Creatinine Ratio: 15 (ref 12–28)
BUN: 6 mg/dL — AB (ref 8–27)
Bilirubin Total: 0.2 mg/dL (ref 0.0–1.2)
CALCIUM: 9.8 mg/dL (ref 8.7–10.3)
CHLORIDE: 89 mmol/L — AB (ref 96–106)
CO2: 33 mmol/L — ABNORMAL HIGH (ref 18–29)
CREATININE: 0.39 mg/dL — AB (ref 0.57–1.00)
GFR calc non Af Amer: 100 mL/min/{1.73_m2} (ref >59)
GFR, EST AFRICAN AMERICAN: 115 mL/min/{1.73_m2} (ref >59)
GLUCOSE: 100 mg/dL — AB (ref 65–99)
Globulin, Total: 1.7 g/dL (ref 1.5–4.5)
Potassium: 4.6 mmol/L (ref 3.5–5.2)
Sodium: 134 mmol/L (ref 134–144)
TOTAL PROTEIN: 5.9 g/dL — AB (ref 6.0–8.5)

## 2016-04-24 MED ORDER — CITALOPRAM HYDROBROMIDE 20 MG PO TABS: 20.0000 mg | ORAL_TABLET | Freq: Every day | ORAL | Status: DC

## 2016-04-24 MED ORDER — LISINOPRIL 40 MG PO TABS: ORAL_TABLET | ORAL | Status: DC

## 2016-04-24 MED ORDER — FUROSEMIDE 20 MG PO TABS: ORAL_TABLET | ORAL | Status: DC

## 2016-04-24 MED ORDER — IPRATROPIUM-ALBUTEROL 20-100 MCG/ACT IN AERS: INHALATION_SPRAY | RESPIRATORY_TRACT | Status: DC

## 2016-04-24 MED ORDER — POTASSIUM CHLORIDE CRYS ER 20 MEQ PO TBCR: 20.0000 meq | EXTENDED_RELEASE_TABLET | Freq: Every day | ORAL | Status: DC

## 2016-04-24 NOTE — Patient Instructions (Signed)
Stress and Stress Management Stress is a normal reaction to life events. It is what you feel when life demands more than you are used to or more than you can handle. Some stress can be useful. For example, the stress reaction can help you catch the last bus of the day, study for a test, or meet a deadline at work. But stress that occurs too often or for too long can cause problems. It can affect your emotional health and interfere with relationships and normal daily activities. Too much stress can weaken your immune system and increase your risk for physical illness. If you already have a medical problem, stress can make it worse. CAUSES  All sorts of life events may cause stress. An event that causes stress for one person may not be stressful for another person. Major life events commonly cause stress. These may be positive or negative. Examples include losing your job, moving into a new home, getting married, having a baby, or losing a loved one. Less obvious life events may also cause stress, especially if they occur day after day or in combination. Examples include working long hours, driving in traffic, caring for children, being in debt, or being in a difficult relationship. SIGNS AND SYMPTOMS Stress may cause emotional symptoms including, the following:  Anxiety. This is feeling worried, afraid, on edge, overwhelmed, or out of control.  Anger. This is feeling irritated or impatient.  Depression. This is feeling sad, down, helpless, or guilty.  Difficulty focusing, remembering, or making decisions. Stress may cause physical symptoms, including the following:   Aches and pains. These may affect your head, neck, back, stomach, or other areas of your body.  Tight muscles or clenched jaw.  Low energy or trouble sleeping. Stress may cause unhealthy behaviors, including the following:   Eating to feel better (overeating) or skipping meals.  Sleeping too little, too much, or both.  Working  too much or putting off tasks (procrastination).  Smoking, drinking alcohol, or using drugs to feel better. DIAGNOSIS  Stress is diagnosed through an assessment by your health care provider. Your health care provider will ask questions about your symptoms and any stressful life events.Your health care provider will also ask about your medical history and may order blood tests or other tests. Certain medical conditions and medicine can cause physical symptoms similar to stress. Mental illness can cause emotional symptoms and unhealthy behaviors similar to stress. Your health care provider may refer you to a mental health professional for further evaluation.  TREATMENT  Stress management is the recommended treatment for stress.The goals of stress management are reducing stressful life events and coping with stress in healthy ways.  Techniques for reducing stressful life events include the following:  Stress identification. Self-monitor for stress and identify what causes stress for you. These skills may help you to avoid some stressful events.  Time management. Set your priorities, keep a calendar of events, and learn to say "no." These tools can help you avoid making too many commitments. Techniques for coping with stress include the following:  Rethinking the problem. Try to think realistically about stressful events rather than ignoring them or overreacting. Try to find the positives in a stressful situation rather than focusing on the negatives.  Exercise. Physical exercise can release both physical and emotional tension. The key is to find a form of exercise you enjoy and do it regularly.  Relaxation techniques. These relax the body and mind. Examples include yoga, meditation, tai chi, biofeedback, deep  breathing, progressive muscle relaxation, listening to music, being out in nature, journaling, and other hobbies. Again, the key is to find one or more that you enjoy and can do  regularly.  Healthy lifestyle. Eat a balanced diet, get plenty of sleep, and do not smoke. Avoid using alcohol or drugs to relax.  Strong support network. Spend time with family, friends, or other people you enjoy being around.Express your feelings and talk things over with someone you trust. Counseling or talktherapy with a mental health professional may be helpful if you are having difficulty managing stress on your own. Medicine is typically not recommended for the treatment of stress.Talk to your health care provider if you think you need medicine for symptoms of stress. HOME CARE INSTRUCTIONS  Keep all follow-up visits as directed by your health care provider.  Take all medicines as directed by your health care provider. SEEK MEDICAL CARE IF:  Your symptoms get worse or you start having new symptoms.  You feel overwhelmed by your problems and can no longer manage them on your own. SEEK IMMEDIATE MEDICAL CARE IF:  You feel like hurting yourself or someone else.   This information is not intended to replace advice given to you by your health care provider. Make sure you discuss any questions you have with your health care provider.   Document Released: 05/23/2001 Document Revised: 12/18/2014 Document Reviewed: 07/22/2013 Elsevier Interactive Patient Education 2016 [employer]  

## 2016-04-24 NOTE — Progress Notes (Signed)
Subjective:    Patient ID: Tiffany Maxwell, female    DOB: August 09, 1934, 82 y.o.   MRN: [id number]   Patient here today for follow up of chronic medical problems. NO changes since last visit.  Outpatient Encounter Prescriptions as of 04/24/2016  Medication Sig  . aspirin EC 81 MG tablet Take 81 mg by mouth daily.  Marland Kitchen azithromycin (ZITHROMAX) 250 MG tablet Take 1 tablet (250 mg total) by mouth daily. Take first 2 tablets together, then 1 every day until finished.  . benzonatate (TESSALON) 100 MG capsule TAKE 1 CAPSULE TWICE DAILY AS NEEDED FOR COUGH  . Calcium Carbonate-Vitamin D (CALCIUM 600+D3) 600-400 MG-UNIT per tablet Take 1 tablet by mouth 2 (two) times daily. (Patient taking differently: Take 1 tablet by mouth daily. )  . COMBIVENT RESPIMAT 20-100 MCG/ACT AERS respimat 2 PUFFS EVERY 6 HOURS AS NEEDED FOR WHEEZING. NO MORE THAN 6 PUFFS PER DAY  . furosemide (LASIX) 20 MG tablet TAKE (1) TABLET DAILY FOR (EXCESSIVE) FLUID.  Marland Kitchen lisinopril (PRINIVIL,ZESTRIL) 40 MG tablet TAKE 1 TABLET DAILY  . polyethylene glycol powder (GLYCOLAX/MIRALAX) powder 17 GRAMS (1 CAPFUL) ONCE A DAY AS DIRECTED  . potassium chloride SA (K-DUR,KLOR-CON) 20 MEQ tablet Take 1 tablet (20 mEq total) by mouth daily.  . VENTOLIN HFA 108 (90 Base) MCG/ACT inhaler 2 PUFFS EVERY 6 HOURS AS NEEDED FOR WHEEZING OR SHORTNESS OF BREATH          Hypertension This is a chronic problem. The current episode started more than 1 year ago. The problem is unchanged. The problem is controlled. Pertinent negatives include no chest pain, headaches, neck pain, palpitations or shortness of breath. Risk factors for coronary artery disease include post-menopausal state. Past treatments include ACE inhibitors and diuretics. The current treatment provides moderate improvement. Compliance problems include diet and exercise.   COPD- Hypoxemia Patient has a long history of COPD and is on continuous oxygen at home- The only inhaler she currently  uses is combivent. SHe says that she has been doing well as long as she stays on her oxygen. Hypokalemia Patient is currently on kdur- denies any cramping of lower ext. Hyponatremia Patient just adds a little Na to diet 1-2X a week- no c/o fatigue. Chronic back pain/compression fractures of back/osteoporosis norco on an as needed basis- usually takes at least 1x a day- walking with a walker due to back pain.  * Patient c/o feeling anxious which makes her breathing difficult- happens at least 1x a day.  Review of Systems  Constitutional: Negative.   HENT: Negative.   Respiratory: Negative for shortness of breath.   Cardiovascular: Negative for chest pain and palpitations.  Genitourinary: Negative.   Musculoskeletal: Negative for neck pain.  Neurological: Negative for headaches.  Psychiatric/Behavioral: Negative.   All other systems reviewed and are negative.  Objective:   Physical Exam  Constitutional: She is oriented to person, place, and time. She appears well-developed and well-nourished.  HENT:  Nose: Nose normal.  Mouth/Throat: Oropharynx is clear and moist.  Eyes: EOM are normal.  Neck: Trachea normal, normal range of motion and full passive range of motion without pain. Neck supple. No JVD present. Carotid bruit is not present. No thyromegaly present.  Cardiovascular: Normal rate, regular rhythm, normal heart sounds and intact distal pulses.  Exam reveals no gallop and no friction rub.   No murmur heard. Pulmonary/Chest: Effort normal.  O2 via nasal cannnulla Diminished breath sounds throughout lung fields  Abdominal: Soft. Bowel sounds are normal. She exhibits no distension and no mass. There is no tenderness.  Musculoskeletal: Normal range of motion.  Walking with a walker  Lymphadenopathy:    She has no cervical  adenopathy.  Neurological: She is alert and oriented to person, place, and time. She has normal reflexes.  Skin: Skin is warm and dry.  greyish brown raised lesion left scalp  Psychiatric: She has a normal mood and affect. Her behavior is normal. Judgment and thought content normal.   BP 132/67 mmHg  Pulse 75  Temp(Src) 97.4 F (36.3 C) (Oral)  Ht   Wt       Assessment & Plan:  1. Essential hypertension Do not add salt to diet - lisinopril (PRINIVIL,ZESTRIL) 40 MG tablet; TAKE 1 TABLET DAILY  Dispense: 30 tablet; Refill: 5 - furosemide (LASIX) 20 MG tablet; TAKE (1) TABLET DAILY FOR (EXCESSIVE) FLUID.  Dispense: 30 tablet; Refill: 5 - CMP14+EGFR  2. COPD with chronic bronchitis ([place]) Avoid cigarette smoke - Ipratropium-Albuterol (COMBIVENT RESPIMAT) 20-100 MCG/ACT AERS respimat; 2 PUFFS EVERY 6 HOURS AS NEEDED FOR WHEEZING. NO MORE THAN 6 PUFFS PER DAY  Dispense: 8 g; Refill: 2  3. Pulmonary emphysema, unspecified emphysema type ([place])  4. Osteoporosis Unable to do weight bearing exercises  5. Hypokalemia - potassium chloride SA (K-DUR,KLOR-CON) 20 MEQ tablet; Take 1 tablet (20 mEq total) by mouth daily.  Dispense: 30 tablet; Refill: 5  6. Hypoxemia Continue continuous O2  7. Midline low back pain without sciatica Fall precautions  8. GAD (generalized anxiety disorder) Stress management Added celexa - citalopram (CELEXA) 20 MG tablet; Take 1 tablet (20 mg total) by mouth daily.  Dispense: 30 tablet; Refill: 5    Labs pending Health maintenance reviewed Diet and exercise encouraged Continue all meds Follow up  In 3 months   Boyle, FNP

## 2016-06-06 ENCOUNTER — Other Ambulatory Visit: Payer: Self-pay | Admitting: Family

## 2016-06-06 ENCOUNTER — Other Ambulatory Visit: Payer: Self-pay | Admitting: Nurse Practitioner

## 2016-06-08 MED ORDER — BENZONATATE 100 MG PO CAPS: 100.0000 mg | ORAL_CAPSULE | Freq: Two times a day (BID) | ORAL | Status: DC | PRN

## 2016-06-18 ENCOUNTER — Inpatient Hospital Stay (HOSPITAL_COMMUNITY)
Admission: EM | Admit: 2016-06-18 | Discharge: 2016-06-20 | DRG: 190 | Disposition: A | Discharge status: Home / Self Care | Payer: Medicare Other | Attending: Family Medicine | Admitting: Family Medicine

## 2016-06-18 ENCOUNTER — Emergency Department (HOSPITAL_COMMUNITY): Payer: Medicare Other

## 2016-06-18 ENCOUNTER — Encounter (HOSPITAL_COMMUNITY): Payer: Self-pay

## 2016-06-18 DIAGNOSIS — Z9981 Dependence on supplemental oxygen: Secondary | ICD-10-CM

## 2016-06-18 DIAGNOSIS — J9621 Acute and chronic respiratory failure with hypoxia: Secondary | ICD-10-CM | POA: Diagnosis present

## 2016-06-18 DIAGNOSIS — E785 Hyperlipidemia, unspecified: Secondary | ICD-10-CM | POA: Diagnosis present

## 2016-06-18 DIAGNOSIS — I5032 Chronic diastolic (congestive) heart failure: Secondary | ICD-10-CM | POA: Diagnosis present

## 2016-06-18 DIAGNOSIS — J441 Chronic obstructive pulmonary disease with (acute) exacerbation: Secondary | ICD-10-CM | POA: Diagnosis not present

## 2016-06-18 DIAGNOSIS — M810 Age-related osteoporosis without current pathological fracture: Secondary | ICD-10-CM | POA: Diagnosis present

## 2016-06-18 DIAGNOSIS — F419 Anxiety disorder, unspecified: Secondary | ICD-10-CM | POA: Diagnosis present

## 2016-06-18 DIAGNOSIS — J439 Emphysema, unspecified: Secondary | ICD-10-CM | POA: Diagnosis present

## 2016-06-18 DIAGNOSIS — J449 Chronic obstructive pulmonary disease, unspecified: Secondary | ICD-10-CM | POA: Diagnosis not present

## 2016-06-18 DIAGNOSIS — E871 Hypo-osmolality and hyponatremia: Secondary | ICD-10-CM | POA: Diagnosis not present

## 2016-06-18 DIAGNOSIS — I252 Old myocardial infarction: Secondary | ICD-10-CM

## 2016-06-18 DIAGNOSIS — R0602 Shortness of breath: Secondary | ICD-10-CM | POA: Diagnosis not present

## 2016-06-18 DIAGNOSIS — I110 Hypertensive heart disease with heart failure: Secondary | ICD-10-CM | POA: Diagnosis present

## 2016-06-18 DIAGNOSIS — R0682 Tachypnea, not elsewhere classified: Secondary | ICD-10-CM | POA: Diagnosis not present

## 2016-06-18 DIAGNOSIS — Z8249 Family history of ischemic heart disease and other diseases of the circulatory system: Secondary | ICD-10-CM

## 2016-06-18 DIAGNOSIS — Z87891 Personal history of nicotine dependence: Secondary | ICD-10-CM

## 2016-06-18 DIAGNOSIS — I519 Heart disease, unspecified: Secondary | ICD-10-CM | POA: Diagnosis not present

## 2016-06-18 DIAGNOSIS — E860 Dehydration: Secondary | ICD-10-CM | POA: Diagnosis present

## 2016-06-18 DIAGNOSIS — I5189 Other ill-defined heart diseases: Secondary | ICD-10-CM | POA: Diagnosis present

## 2016-06-18 DIAGNOSIS — J4489 Other specified chronic obstructive pulmonary disease: Secondary | ICD-10-CM

## 2016-06-18 LAB — CBC WITH DIFFERENTIAL/PLATELET
BASOS ABS: 0 10*3/uL (ref 0.0–0.1)
BASOS PCT: 0 %
EOS ABS: 0.2 10*3/uL (ref 0.0–0.7)
EOS PCT: 3 %
HCT: 38 % (ref 36.0–46.0)
HEMOGLOBIN: 12.3 g/dL (ref 12.0–15.0)
LYMPHS ABS: 1.1 10*3/uL (ref 0.7–4.0)
Lymphocytes Relative: 16 %
MCH: 28.9 pg (ref 26.0–34.0)
MCHC: 32.4 g/dL (ref 30.0–36.0)
MCV: 89.2 fL (ref 78.0–100.0)
Monocytes Absolute: 0.4 10*3/uL (ref 0.1–1.0)
Monocytes Relative: 6 %
NEUTROS PCT: 75 %
Neutro Abs: 5 10*3/uL (ref 1.7–7.7)
PLATELETS: 202 10*3/uL (ref 150–400)
RBC: 4.26 MIL/uL (ref 3.87–5.11)
RDW: 11.9 % (ref 11.5–15.5)
WBC: 6.7 10*3/uL (ref 4.0–10.5)

## 2016-06-18 LAB — BASIC METABOLIC PANEL
Anion gap: 7 (ref 5–15)
BUN: 7 mg/dL (ref 6–20)
CALCIUM: 9.5 mg/dL (ref 8.9–10.3)
CHLORIDE: 86 mmol/L — AB (ref 101–111)
CO2: 37 mmol/L — ABNORMAL HIGH (ref 22–32)
CREATININE: 0.37 mg/dL — AB (ref 0.44–1.00)
Glucose, Bld: 115 mg/dL — ABNORMAL HIGH (ref 65–99)
Potassium: 3.8 mmol/L (ref 3.5–5.1)
SODIUM: 130 mmol/L — AB (ref 135–145)

## 2016-06-18 LAB — BRAIN NATRIURETIC PEPTIDE: B NATRIURETIC PEPTIDE 5: 25 pg/mL (ref 0.0–100.0)

## 2016-06-18 MED ORDER — SODIUM CHLORIDE 0.9% FLUSH
3.0000 mL | Freq: Two times a day (BID) | INTRAVENOUS | Status: DC
  Administered 2016-06-19: 3 mL via INTRAVENOUS

## 2016-06-18 MED ORDER — IPRATROPIUM-ALBUTEROL 0.5-2.5 (3) MG/3ML IN SOLN
3.0000 mL | Freq: Four times a day (QID) | RESPIRATORY_TRACT | Status: DC
  Administered 2016-06-19 – 2016-06-20 (×6): 3 mL via RESPIRATORY_TRACT
  Filled 2016-06-18 (×6): qty 3

## 2016-06-18 MED ORDER — BENZONATATE 100 MG PO CAPS
100.0000 mg | ORAL_CAPSULE | Freq: Two times a day (BID) | ORAL | Status: DC | PRN
Start: 2016-06-18 — End: 2016-06-20
  Administered 2016-06-19: 100 mg via ORAL
  Filled 2016-06-18: qty 1

## 2016-06-18 MED ORDER — ACETAMINOPHEN 650 MG RE SUPP: 650.0000 mg | Freq: Four times a day (QID) | RECTAL | Status: DC | PRN

## 2016-06-18 MED ORDER — IPRATROPIUM BROMIDE 0.02 % IN SOLN
0.5000 mg | Freq: Once | RESPIRATORY_TRACT | Status: AC
  Administered 2016-06-18: 0.5 mg via RESPIRATORY_TRACT
  Filled 2016-06-18: qty 2.5

## 2016-06-18 MED ORDER — ONDANSETRON HCL 4 MG PO TABS: 4.0000 mg | ORAL_TABLET | Freq: Four times a day (QID) | ORAL | Status: DC | PRN

## 2016-06-18 MED ORDER — SODIUM CHLORIDE 0.9% FLUSH
3.0000 mL | Freq: Two times a day (BID) | INTRAVENOUS | Status: DC
  Administered 2016-06-18 – 2016-06-20 (×2): 3 mL via INTRAVENOUS

## 2016-06-18 MED ORDER — ONDANSETRON HCL 4 MG/2ML IJ SOLN: 4.0000 mg | Freq: Four times a day (QID) | INTRAMUSCULAR | Status: DC | PRN

## 2016-06-18 MED ORDER — IPRATROPIUM-ALBUTEROL 0.5-2.5 (3) MG/3ML IN SOLN
3.0000 mL | Freq: Four times a day (QID) | RESPIRATORY_TRACT | Status: DC
  Administered 2016-06-18: 3 mL via RESPIRATORY_TRACT
  Filled 2016-06-18: qty 3

## 2016-06-18 MED ORDER — ASPIRIN EC 81 MG PO TBEC
81.0000 mg | DELAYED_RELEASE_TABLET | Freq: Every day | ORAL | Status: DC
  Administered 2016-06-19 – 2016-06-20 (×2): 81 mg via ORAL
  Filled 2016-06-18 (×2): qty 1

## 2016-06-18 MED ORDER — SODIUM CHLORIDE 0.9% FLUSH: 3.0000 mL | INTRAVENOUS | Status: DC | PRN

## 2016-06-18 MED ORDER — ALBUTEROL (5 MG/ML) CONTINUOUS INHALATION SOLN
10.0000 mg/h | INHALATION_SOLUTION | Freq: Once | RESPIRATORY_TRACT | Status: AC
  Administered 2016-06-18: 10 mg/h via RESPIRATORY_TRACT
  Filled 2016-06-18: qty 20

## 2016-06-18 MED ORDER — AZITHROMYCIN 250 MG PO TABS: 500.0000 mg | ORAL_TABLET | Freq: Every day | ORAL | Status: AC

## 2016-06-18 MED ORDER — ALBUTEROL SULFATE (2.5 MG/3ML) 0.083% IN NEBU: 2.5000 mg | INHALATION_SOLUTION | RESPIRATORY_TRACT | Status: DC | PRN

## 2016-06-18 MED ORDER — SODIUM CHLORIDE 0.9 % IV SOLN: 250.0000 mL | INTRAVENOUS | Status: DC | PRN

## 2016-06-18 MED ORDER — CITALOPRAM HYDROBROMIDE 20 MG PO TABS
20.0000 mg | ORAL_TABLET | Freq: Every day | ORAL | Status: DC
  Administered 2016-06-19 – 2016-06-20 (×2): 20 mg via ORAL
  Filled 2016-06-18 (×2): qty 1

## 2016-06-18 MED ORDER — METHYLPREDNISOLONE SODIUM SUCC 40 MG IJ SOLR
40.0000 mg | Freq: Two times a day (BID) | INTRAMUSCULAR | Status: DC
  Administered 2016-06-19 – 2016-06-20 (×3): 40 mg via INTRAVENOUS
  Filled 2016-06-18 (×3): qty 1

## 2016-06-18 MED ORDER — ALBUTEROL SULFATE (2.5 MG/3ML) 0.083% IN NEBU
2.5000 mg | INHALATION_SOLUTION | RESPIRATORY_TRACT | Status: DC | PRN
  Administered 2016-06-18 – 2016-06-20 (×4): 2.5 mg via RESPIRATORY_TRACT
  Filled 2016-06-18 (×4): qty 3

## 2016-06-18 MED ORDER — AZITHROMYCIN 250 MG PO TABS
250.0000 mg | ORAL_TABLET | Freq: Every day | ORAL | Status: DC
  Administered 2016-06-19 – 2016-06-20 (×2): 250 mg via ORAL
  Filled 2016-06-18 (×2): qty 1

## 2016-06-18 MED ORDER — CETYLPYRIDINIUM CHLORIDE 0.05 % MT LIQD
7.0000 mL | Freq: Two times a day (BID) | OROMUCOSAL | Status: DC
  Administered 2016-06-18 – 2016-06-20 (×3): 7 mL via OROMUCOSAL

## 2016-06-18 MED ORDER — ACETAMINOPHEN 325 MG PO TABS: 650.0000 mg | ORAL_TABLET | Freq: Four times a day (QID) | ORAL | Status: DC | PRN

## 2016-06-18 NOTE — ED Notes (Signed)
Pt requesting breathing tx. Pt has bilateral expiratory wheezes noted. Murray Hodgkins paged.

## 2016-06-18 NOTE — H&P (Signed)
History and Physical  Tiffany Maxwell [id number] DOB: [birth year] DOA: 06/18/2016  PCP: [name], FNP  Patient coming from: home  Chief Complaint: shortness of breath  HPI:  17 yof with a history of COPD, chronic hypoxic respiratory failure on 4L home oxygen, presented with complaints of shortness of breath and sputum production. While in the ED, patient was noted to be afebrile stable vital signs, and stable oxygen saturation on her home O2. Referred for COPD exacerbation.  Patient presents with complaints of shortness of breath and sputum production. She reports that she was experiencing shortness of breath last weekend, but did not come to the ED because her symptoms resolved. She was told at the time that her shortness of breath was due to anxiety and COPD. She states that she ran out of her inhalers roughly 2 days ago, and that her pharmacy will not refill them. She reports newest onset of shortness of breath onset roughly two days ago as well with an associated mild cough with production of white sputum. She has since called EMS 2-3 days in a row and received nebulizer treatments, but would then refuse to come to the ED. She had not taken any medications to alleviate her symptoms. Since arriving in the ED, her respiratory status appears to have improved. She denies any alcohol use or fever. She admits to a history of smoking, but quit in 2013.  Since arriving in the ED, patient reports feeling improved with nebulizer treatment that she was given by EMS.  In the emergency department: Afebrile, vital signs stable, stable oxygenation on home oxygen Pertinent labs: Basic metabolic panel, BNP, CBC unremarkable EKG: Independently reviewed. Sinus rhythm, no acute changes Imaging: Chest x-ray independently reviewed. Possible vascular congestion. No infiltrate noted.  Review of Systems: Visual changes positive for macular degeneration. Occasional nausea. Negative for fever, sore  throat, rash, new muscle aches, chest pain, dysuria, bleeding, vomiting/abdominal pain.  Past Medical History  Diagnosis Date  . Osteoporosis   . Palpitations   . PAC (premature atrial contraction)   . Seborrheic keratosis   . Hypertension   . Hyperlipidemia   . COPD (chronic obstructive pulmonary disease) ([place])   . MI (myocardial infarction) ([place])     pt unaware  . Multiple fractures of thoracic spine, closed ([place]) 01/30/2013  . Cholelithiasis 01/30/2013  . Pulmonary emphysema ([place]) 01/30/2013  . History of tobacco use 01/30/2013  . Diastolic dysfunction [id number]    Grade 1. Ejection fraction 65%.  . Compression fracture of lumbar spine, non-traumatic 04/09/2014    Past Surgical History  Procedure Laterality Date  . Tubal ligation    . Compression hip screw Left 04/29/2013    Procedure: COMPRESSION HIP;  Surgeon: Sanjuana Kava, MD;  Location: [facility];  Service: Orthopedics;  Laterality: Left;  [place]     reports that she quit smoking about 3 years ago. Her smoking use included Cigarettes. She has a 40 pack-year smoking history. She has quit using smokeless tobacco. She reports that she does not drink alcohol or use illicit drugs. Ambulatory status: ambulatory  Allergies  Allergen Reactions  . Codeine Other (See Comments)    headache  . Benicar Hct [Olmesartan Medoxomil-Hctz] Swelling and Rash  . Naproxen Itching, Rash and Other (See Comments)    Redness/flushing of skin    Family History  Problem Relation Age of Onset  . Hypertension Mother    Prior to Admission medications   Medication Sig Start Date End Date Taking? Authorizing Provider  aspirin EC 81 MG tablet Take 81 mg by mouth daily.    Historical Provider, MD  benzonatate (TESSALON) 100 MG capsule Take 1 capsule (100 mg total) by mouth 2 (two) times daily as needed for cough. 06/08/16   Mary-Margaret Hassell Done, FNP  Calcium Carbonate-Vitamin D (CALCIUM 600+D3) 600-400 MG-UNIT per tablet Take 1 tablet by mouth 2  (two) times daily. Patient taking differently: Take 1 tablet by mouth daily.  04/09/14   Rexene Alberts, MD  citalopram (CELEXA) 20 MG tablet Take 1 tablet (20 mg total) by mouth daily. 04/24/16   Mary-Margaret Hassell Done, FNP  furosemide (LASIX) 20 MG tablet TAKE (1) TABLET DAILY FOR (EXCESSIVE) FLUID. 04/24/16   Mary-Margaret Hassell Done, FNP  Ipratropium-Albuterol (COMBIVENT RESPIMAT) 20-100 MCG/ACT AERS respimat 2 PUFFS EVERY 6 HOURS AS NEEDED FOR WHEEZING. NO MORE THAN 6 PUFFS PER DAY 04/24/16   Mary-Margaret Hassell Done, FNP  lisinopril (PRINIVIL,ZESTRIL) 40 MG tablet TAKE 1 TABLET DAILY 04/24/16   Mary-Margaret Hassell Done, FNP  polyethylene glycol powder (GLYCOLAX/MIRALAX) powder 17 GRAMS (1 CAPFUL) ONCE A DAY AS DIRECTED 06/08/16   Mary-Margaret Hassell Done, FNP  potassium chloride SA (K-DUR,KLOR-CON) 20 MEQ tablet Take 1 tablet (20 mEq total) by mouth daily. 04/24/16   Mary-Margaret Hassell Done, FNP  VENTOLIN HFA 108 (90 Base) MCG/ACT inhaler 2 PUFFS EVERY 6 HOURS AS NEEDED FOR WHEEZING OR SHORTNESS OF BREATH 06/08/16   Mary-Margaret Hassell Done, FNP   Physical Exam: Filed Vitals:   06/18/16 1230 06/18/16 1300 06/18/16 1400 06/18/16 1530  BP: 122/73 122/66 127/106 135/71  Pulse: 98 96 93 88  Temp:      TempSrc:      Resp:  16 18 16   Weight:      SpO2: 91% 94% 94% 91%   Constitutional:  . Appears calm and comfortable Eyes:  . PERRL and irises appear normal . Normal conjunctivae and lids ENMT:  . external ears, nose appear normal . hard of hearing . Lips appear normal . Oropharynx: mucosa, tongue appears normal Neck:  . neck appears normal, no masses, normal ROM, supple . no thyromegaly Respiratory:  . CTA bilaterally, no w/r/r.   . Respiratory effort normal. No retractions or accessory muscle use Cardiovascular:  . RRR, no m/r/g  . No LE extremity edema   Abdomen:  . Abdomen appears normal; no tenderness or masses, nondistended Musculoskeletal:  . RUE, LUE, RLE, LLE   o strength and tone normal, no  atrophy, no abnormal movements o No tenderness, masses Skin:  . No rashes, lesions, ulcers . palpation of skin: no induration or nodules Neurologic:  . Grossly normal Psychiatric:  . judgement and insight appear normal . Mental status o Mood, affect appropriate  Wt Readings from Last 3 Encounters:  06/18/16 54.432 kg (120 lb)  02/25/16 55.339 kg (122 lb)  01/07/16 55.339 kg (122 lb)    I have personally reviewed following labs and imaging studies  Labs on Admission:  CBC:  Recent Labs Lab 06/18/16 0652  WBC 6.7  NEUTROABS 5.0  HGB 12.3  HCT 38.0  MCV 89.2  PLT 123XX123   Basic Metabolic Panel:  Recent Labs Lab 06/18/16 0652  NA 130*  K 3.8  CL 86*  CO2 37*  GLUCOSE 115*  BUN 7  CREATININE 0.37*  CALCIUM 9.5     Radiological Exams on Admission: Dg Chest Port 1 View  06/18/2016  CLINICAL DATA:  Acute onset of shortness of breath and wheezing. Initial encounter. EXAM: PORTABLE CHEST 1 VIEW COMPARISON:  Chest radiograph performed 02/25/2016 FINDINGS:  The lungs are well-aerated. Vascular congestion is noted. Increased interstitial markings raise concern for pulmonary edema. There is no evidence of pleural effusion or pneumothorax. The cardiomediastinal silhouette is mildly enlarged. No acute osseous abnormalities are seen. IMPRESSION: Vascular congestion and mild cardiomegaly. Increased interstitial markings raise concern for pulmonary edema. Electronically Signed   By: Garald Balding M.D.   On: 06/18/2016 06:46    EKG: Independently reviewed.  Principal Problem:   COPD exacerbation ([place]) Active Problems:   COPD with chronic bronchitis ([facility])   Pulmonary emphysema ([facility])   Diastolic dysfunction   Assessment/Plan 1. Acute on chronic hypoxic respiratory failure. Wears 4 L at home.  2. COPD exacerbation.  3. Chronic diastolic CHF. Despite chest x-ray findings, patient has no evidence of volume overload. 4. Tobacco use disorder in remission   Observation    Steroids, bronchodilators, antibiotics for productive sputum, continue supplemental oxygen  DVT prophylaxis:lovenox Code Status: Full Family Communication: discussed with patient. No family present Disposition Plan: admit to telemetry. Discharge home once improved   Consults called: none   Admission status: admit as observation    Time spent: 55 minutes  Murray Hodgkins, MD  [facility] Direct contact: [phone] --[place] [place]  --[email or web address]; password [email or web address]  7PM-7AM contact night coverage as above  06/18/2016, 3:46 PM  By signing my name below, I, Delene Ruffini, attest that this documentation has been prepared under the direction and in the presence of Daniel P. Sarajane Jews, MD. Electronically Signed: Delene Ruffini, Scribe.  06/18/2016 2:10pm     I personally performed the services described in this documentation. All medical record entries made by the scribe were at my direction. I have reviewed the chart and agree that the record reflects my personal performance and is accurate and complete. Murray Hodgkins, MD

## 2016-06-18 NOTE — ED Notes (Signed)
She has been having trouble breathing for the past several days and has called EMS and they have given her breathing treatments and she convinces them to let her stay at home. She has had breathing treatment and solumedrol IV

## 2016-06-18 NOTE — ED Notes (Signed)
RT at bedside.

## 2016-06-18 NOTE — Progress Notes (Deleted)
Notified PA, pt's heart rate has been dropping to the 30s and 40s. PA order an EKG, to hold pt's morning dose of Verapamil, and to notify MD/PA before giving any Benzodiazepines.

## 2016-06-18 NOTE — ED Notes (Signed)
RT paged.

## 2016-06-18 NOTE — ED Notes (Signed)
MD Goodrich at bedside.

## 2016-06-18 NOTE — ED Provider Notes (Signed)
CSN: [id number]     Arrival date & time 06/18/16  0550 History   First MD Initiated Contact with Patient 06/18/16 06:08 AM    Chief Complaint  Patient presents with  . Shortness of Breath     (Consider location/radiation/quality/duration/timing/severity/associated sxs/prior Treatment) HPI patient states she ran out of her inhalers about 2 days ago. She states she started having trouble breathing about the same time. She's had a mild cough with some white sputum production. She denies fever, sore throat, or rhinorrhea. She is currently on oxygen at 4 L/m nasal cannula. She has called out EMS 2-3 days in a row and when they gave her a nebulizer she then refused to come to the ED. Tonight they refused to give her treatment was she agreed to come to the ED. She states she's feeling better after the nebulizer treatment EMS gave her. They also gave her Solu-Medrol IV. Patient states she has run out of her inhalers and she has refills however the pharmacy will not refill them because it is "too soon". She states EMS came out yesterday and she was fine until about 4:30 this morning when she got acutely worse again.    PCP [name] NP  Past Medical History  Diagnosis Date  . Osteoporosis   . Palpitations   . PAC (premature atrial contraction)   . Seborrheic keratosis   . Hypertension   . Hyperlipidemia   . COPD (chronic obstructive pulmonary disease) ([place])   . MI (myocardial infarction) ([place])     pt unaware  . Multiple fractures of thoracic spine, closed ([place]) 01/30/2013  . Cholelithiasis 01/30/2013  . Pulmonary emphysema ([place]) 01/30/2013  . History of tobacco use 01/30/2013  . Diastolic dysfunction [id number]    Grade 1. Ejection fraction 65%.  . Compression fracture of lumbar spine, non-traumatic 04/09/2014   Past Surgical History  Procedure Laterality Date  . Tubal ligation    . Compression hip screw Left 04/29/2013    Procedure: COMPRESSION HIP;  Surgeon: Sanjuana Kava, MD;   Location: [facility];  Service: Orthopedics;  Laterality: Left;  [place]   Family History  Problem Relation Age of Onset  . Hypertension Mother    Social History  Substance Use Topics  . Smoking status: Former Smoker -- 1.00 packs/day for 40 years    Types: Cigarettes    Quit date: 07/10/2012  . Smokeless tobacco: Former Systems developer  . Alcohol Use: No   Lives with son Son smokes in the house  OB History    No data available     Review of Systems  All other systems reviewed and are negative.     Allergies  Codeine; Benicar hct; and Naproxen  Home Medications   Prior to Admission medications   Medication Sig Start Date End Date Taking? Authorizing Provider  aspirin EC 81 MG tablet Take 81 mg by mouth daily.    Historical Provider, MD  benzonatate (TESSALON) 100 MG capsule Take 1 capsule (100 mg total) by mouth 2 (two) times daily as needed for cough. 06/08/16   Mary-Margaret Hassell Done, FNP  Calcium Carbonate-Vitamin D (CALCIUM 600+D3) 600-400 MG-UNIT per tablet Take 1 tablet by mouth 2 (two) times daily. Patient taking differently: Take 1 tablet by mouth daily.  04/09/14   Rexene Alberts, MD  citalopram (CELEXA) 20 MG tablet Take 1 tablet (20 mg total) by mouth daily. 04/24/16   Mary-Margaret Hassell Done, FNP  furosemide (LASIX) 20 MG tablet TAKE (1) TABLET DAILY FOR (EXCESSIVE) FLUID.  04/24/16   Mary-Margaret Hassell Done, FNP  Ipratropium-Albuterol (COMBIVENT RESPIMAT) 20-100 MCG/ACT AERS respimat 2 PUFFS EVERY 6 HOURS AS NEEDED FOR WHEEZING. NO MORE THAN 6 PUFFS PER DAY 04/24/16   Mary-Margaret Hassell Done, FNP  lisinopril (PRINIVIL,ZESTRIL) 40 MG tablet TAKE 1 TABLET DAILY 04/24/16   Mary-Margaret Hassell Done, FNP  polyethylene glycol powder (GLYCOLAX/MIRALAX) powder 17 GRAMS (1 CAPFUL) ONCE A DAY AS DIRECTED 06/08/16   Mary-Margaret Hassell Done, FNP  potassium chloride SA (K-DUR,KLOR-CON) 20 MEQ tablet Take 1 tablet (20 mEq total) by mouth daily. 04/24/16   Mary-Margaret Hassell Done, FNP  VENTOLIN HFA 108 (90 Base)  MCG/ACT inhaler 2 PUFFS EVERY 6 HOURS AS NEEDED FOR WHEEZING OR SHORTNESS OF BREATH 06/08/16   Mary-Margaret Hassell Done, FNP   BP 155/74 mmHg  Pulse 77  Temp(Src) 97.6 F (36.4 C) (Oral)  Resp 22  Wt 120 lb (54.432 kg)  SpO2 90%  Vital signs normal   Physical Exam  Constitutional: She is oriented to person, place, and time.  Non-toxic appearance. She does not appear ill. No distress.  Seems hard of hearing. Elderly frail female  HENT:  Head: Normocephalic and atraumatic.  Right Ear: External ear normal.  Left Ear: External ear normal.  Nose: Nose normal. No mucosal edema or rhinorrhea.  Mouth/Throat: Oropharynx is clear and moist and mucous membranes are normal. No dental abscesses or uvula swelling.  Eyes: Conjunctivae and EOM are normal. Pupils are equal, round, and reactive to light.  Neck: Normal range of motion and full passive range of motion without pain. Neck supple.  Cardiovascular: Normal rate, regular rhythm and normal heart sounds.  Exam reveals no gallop and no friction rub.   No murmur heard. Pulmonary/Chest: Effort normal. No respiratory distress. She has decreased breath sounds. She has wheezes. She has no rhonchi. She has no rales. She exhibits no tenderness and no crepitus.  Patient is noted to have generalized diminished breath sounds however she also has diffuse and expiratory wheezing in all lung fields. She has some coughing during our exam.  Abdominal: Soft. Normal appearance and bowel sounds are normal. She exhibits no distension. There is no tenderness. There is no rebound and no guarding.  Musculoskeletal: Normal range of motion. She exhibits no edema or tenderness.  Moves all extremities well. + kyphosis  Neurological: She is alert and oriented to person, place, and time. She has normal strength. No cranial nerve deficit.  Skin: Skin is warm, dry and intact. No rash noted. No erythema. No pallor.  Psychiatric: She has a normal mood and affect. Her speech is  normal and behavior is normal. Her mood appears not anxious.  Nursing note and vitals reviewed.   ED Course  Procedures (including critical care time)  Medications  albuterol (PROVENTIL,VENTOLIN) solution continuous neb (10 mg/hr Nebulization Given 06/18/16 0619)  ipratropium (ATROVENT) nebulizer solution 0.5 mg (0.5 mg Nebulization Given 06/18/16 0619)   Patient's pulse ox was 91% on her usual 4 L/m nasal cannula.  Patient was started on a continuous nebulizer. She was ready given Solu-Medrol by EMS.  Recheck at 7:20 AM patient is sleeping. When she is awakened she states she's feeling better. She still has diffuse wheezing. She is still getting her continuous nebulizer.  After reviewing her chest x-ray 8 BNP was ordered. Patient has no peripheral edema on exam.  Recheck at 8:15 AM patient sleeping. Her pulse ox is 96% with the nebulizer mask in place. She has finished her continuous nebulizer. She now has diffuse rhonchi. Patient was asked if she  felt like she could go home and she states she feels like she needs to be admitted. I will talk to the hospitalist.  Labs Review Results for orders placed or performed during the hospital encounter of 0000000  Basic metabolic panel  Result Value Ref Range   Sodium 130 (L) 135 - 145 mmol/L   Potassium 3.8 3.5 - 5.1 mmol/L   Chloride 86 (L) 101 - 111 mmol/L   CO2 37 (H) 22 - 32 mmol/L   Glucose, Bld 115 (H) 65 - 99 mg/dL   BUN 7 6 - 20 mg/dL   Creatinine, Ser 0.37 (L) 0.44 - 1.00 mg/dL   Calcium 9.5 8.9 - 10.3 mg/dL   GFR calc non Af Amer >60 >60 mL/min   GFR calc Af Amer >60 >60 mL/min   Anion gap 7 5 - 15  CBC with Differential  Result Value Ref Range   WBC 6.7 4.0 - 10.5 K/uL   RBC 4.26 3.87 - 5.11 MIL/uL   Hemoglobin 12.3 12.0 - 15.0 g/dL   HCT 38.0 36.0 - 46.0 %   MCV 89.2 78.0 - 100.0 fL   MCH 28.9 26.0 - 34.0 pg   MCHC 32.4 30.0 - 36.0 g/dL   RDW 11.9 11.5 - 15.5 %   Platelets 202 150 - 400 K/uL   Neutrophils Relative % 75  %   Neutro Abs 5.0 1.7 - 7.7 K/uL   Lymphocytes Relative 16 %   Lymphs Abs 1.1 0.7 - 4.0 K/uL   Monocytes Relative 6 %   Monocytes Absolute 0.4 0.1 - 1.0 K/uL   Eosinophils Relative 3 %   Eosinophils Absolute 0.2 0.0 - 0.7 K/uL   Basophils Relative 0 %   Basophils Absolute 0.0 0.0 - 0.1 K/uL   Laboratory interpretation all normal except Hyponatremia  Imaging Review Dg Chest Port 1 View  06/18/2016  CLINICAL DATA:  Acute onset of shortness of breath and wheezing. Initial encounter. EXAM: PORTABLE CHEST 1 VIEW COMPARISON:  Chest radiograph performed 02/25/2016 FINDINGS: The lungs are well-aerated. Vascular congestion is noted. Increased interstitial markings raise concern for pulmonary edema. There is no evidence of pleural effusion or pneumothorax. The cardiomediastinal silhouette is mildly enlarged. No acute osseous abnormalities are seen. IMPRESSION: Vascular congestion and mild cardiomegaly. Increased interstitial markings raise concern for pulmonary edema. Electronically Signed   By: Garald Balding M.D.   On: 06/18/2016 06:46   I have personally reviewed and evaluated these images and lab results as part of my medical decision-making.    MDM   Final diagnoses:  COPD exacerbation [facility])   Plan admission   Rolland Porter, MD, Barbette Or, MD 06/18/16 [phone]

## 2016-06-19 DIAGNOSIS — E871 Hypo-osmolality and hyponatremia: Secondary | ICD-10-CM

## 2016-06-19 DIAGNOSIS — F419 Anxiety disorder, unspecified: Secondary | ICD-10-CM | POA: Diagnosis present

## 2016-06-19 DIAGNOSIS — I252 Old myocardial infarction: Secondary | ICD-10-CM | POA: Diagnosis not present

## 2016-06-19 DIAGNOSIS — I110 Hypertensive heart disease with heart failure: Secondary | ICD-10-CM | POA: Diagnosis present

## 2016-06-19 DIAGNOSIS — I5032 Chronic diastolic (congestive) heart failure: Secondary | ICD-10-CM | POA: Diagnosis not present

## 2016-06-19 DIAGNOSIS — Z87891 Personal history of nicotine dependence: Secondary | ICD-10-CM | POA: Diagnosis not present

## 2016-06-19 DIAGNOSIS — Z9981 Dependence on supplemental oxygen: Secondary | ICD-10-CM | POA: Diagnosis not present

## 2016-06-19 DIAGNOSIS — E860 Dehydration: Secondary | ICD-10-CM | POA: Diagnosis present

## 2016-06-19 DIAGNOSIS — Z8249 Family history of ischemic heart disease and other diseases of the circulatory system: Secondary | ICD-10-CM | POA: Diagnosis not present

## 2016-06-19 DIAGNOSIS — J9621 Acute and chronic respiratory failure with hypoxia: Secondary | ICD-10-CM | POA: Diagnosis present

## 2016-06-19 DIAGNOSIS — E785 Hyperlipidemia, unspecified: Secondary | ICD-10-CM | POA: Diagnosis present

## 2016-06-19 DIAGNOSIS — M810 Age-related osteoporosis without current pathological fracture: Secondary | ICD-10-CM | POA: Diagnosis present

## 2016-06-19 DIAGNOSIS — J441 Chronic obstructive pulmonary disease with (acute) exacerbation: Secondary | ICD-10-CM | POA: Diagnosis not present

## 2016-06-19 LAB — BASIC METABOLIC PANEL
Anion gap: 7 (ref 5–15)
BUN: 9 mg/dL (ref 6–20)
CALCIUM: 9 mg/dL (ref 8.9–10.3)
CO2: 34 mmol/L — AB (ref 22–32)
CREATININE: 0.45 mg/dL (ref 0.44–1.00)
Chloride: 87 mmol/L — ABNORMAL LOW (ref 101–111)
GFR calc Af Amer: >60 mL/min (ref >60)
GLUCOSE: 190 mg/dL — AB (ref 65–99)
Potassium: 3.6 mmol/L (ref 3.5–5.1)
Sodium: 128 mmol/L — ABNORMAL LOW (ref 135–145)

## 2016-06-19 MED ORDER — SODIUM CHLORIDE 0.9 % IV SOLN
INTRAVENOUS | Status: DC
  Administered 2016-06-19 – 2016-06-20 (×3): via INTRAVENOUS

## 2016-06-19 NOTE — Care Management Obs Status (Signed)
[facility] NOTIFICATION   Patient Details  Name: Tiffany Maxwell MRN: [id number] Date of Birth: Aug 01, 1934   Medicare Observation Status Notification Given:  Yes    Cabela Pacifico, Chauncey Reading, RN 06/19/2016, 10:45 AM

## 2016-06-19 NOTE — Discharge Summary (Signed)
Physician Discharge Summary  Tiffany Maxwell [id number] DOB: July 08, 1934 DOA: 06/18/2016  PCP: [name], FNP  Admit date: 06/18/2016 Discharge date: 06/20/2016  Recommendations for Outpatient Follow-up:  1. Follow up COPD exacerbation   Follow-up Information    Follow up with [name], FNP In 1 week.   Specialty:  Family Medicine   Contact information:   [place] [address] [phone]      Discharge Diagnoses:  1. Acute on chronic hypoxic respiratory failure 2. COPD exacerbation 3. Hyponatremia secondary to dehydration 4. Chronic diastolic CHF 5. Tobacco use disorder in remission  Discharge Condition: improved Disposition: discharge home  Diet recommendation: regular  Filed Weights   06/18/16 0549 06/18/16 1706  Weight: 54.432 kg (120 lb) 56.2 kg (123 lb 14.4 oz)    History of present illness:  82 year old woman PMH COPD, chronic hypoxic respiratory failure on 4L home O2, presented with complaints of shortness of breath. Admitted for COPD exacerbation.  Hospital Course:  Admitted, treated with antibiotics, steroids, bronchodilators with rapid clinical improvement. Her history and clinical exam did not suggest acute CHF despite chest x-ray findings. Her hospitalization may have been precipitated by running out of inhaler at home.  1. Acute on chronic hypoxic respiratory failure. Acute issues resolved. 2. Hyponatremia secondary to dehydration. Improved with IVF. Asymptomatic. 3. COPD exacerbation. Resolved 4. Chronic diastolic CHF. Appears compensated 5. Tobacco use disorder in remission  Consultants: 6. none  Procedures:  none  Antimicrobials:  Zithromax 7/09 >>  Discharge Instructions  Discharge Instructions    Activity as tolerated - No restrictions    Complete by:  As directed      Diet - low sodium heart healthy    Complete by:  As directed      Discharge instructions    Complete by:  As directed   Call your physician or seek immediate medical attention for shortness of breath, wheezing or worsening of condition.          Discharge Medication List as of 06/20/2016  2:59 PM    START taking these medications   Details  azithromycin (ZITHROMAX) 250 MG tablet Take 1 tablet (250 mg total) by mouth daily. Start 7/12 in the morning., Starting 06/20/2016, Until Discontinued, Normal    predniSONE (DELTASONE) 10 MG tablet Take daily by mouth: 20 mg x3 days, then 10 mg x3 days, then stop., Print      CONTINUE these medications which have CHANGED   Details  albuterol (VENTOLIN HFA) 108 (90 Base) MCG/ACT inhaler Inhale 1 puff into the lungs every 6 (six) hours as needed for wheezing or shortness of breath., Starting 06/20/2016, Until Discontinued, Print    Ipratropium-Albuterol (COMBIVENT RESPIMAT) 20-100 MCG/ACT AERS respimat Inhale 1 puff into the lungs 4 (four) times daily., Starting 06/20/2016, Until Discontinued, Print      CONTINUE these medications which have NOT CHANGED   Details  aspirin EC 81 MG tablet Take 81 mg by mouth daily., Until Discontinued, Historical Med    benzonatate (TESSALON) 100 MG capsule Take 1 capsule (100 mg total) by mouth 2 (two) times daily as needed for cough., Starting 06/08/2016, Until Discontinued, Normal    Calcium Carbonate-Vitamin D (CALCIUM 600+D3) 600-400 MG-UNIT per tablet Take 1 tablet by mouth 2 (two) times daily., Starting 04/09/2014, Until Discontinued, No Print    citalopram (CELEXA) 20 MG tablet Take 1 tablet (20 mg total) by mouth daily., Starting 04/24/2016, Until Discontinued, Normal    furosemide (LASIX) 20 MG tablet TAKE (  1) TABLET DAILY FOR (EXCESSIVE) FLUID., Normal    lisinopril (PRINIVIL,ZESTRIL) 40 MG tablet TAKE 1 TABLET DAILY, Normal    polyethylene glycol powder (GLYCOLAX/MIRALAX) powder 17 GRAMS (1 CAPFUL) ONCE A DAY AS DIRECTED, Normal    potassium chloride SA (K-DUR,KLOR-CON) 20 MEQ tablet Take 1 tablet (20 mEq total) by mouth  daily., Starting 04/24/2016, Until Discontinued, Normal       Allergies  Allergen Reactions  . Codeine Other (See Comments)    headache  . Benicar Hct [Olmesartan Medoxomil-Hctz] Swelling and Rash  . Naproxen Itching, Rash and Other (See Comments)    Redness/flushing of skin    The results of significant diagnostics from this hospitalization (including imaging, microbiology, ancillary and laboratory) are listed below for reference.    Significant Diagnostic Studies: Dg Chest Port 1 View  06/18/2016  CLINICAL DATA:  Acute onset of shortness of breath and wheezing. Initial encounter. EXAM: PORTABLE CHEST 1 VIEW COMPARISON:  Chest radiograph performed 02/25/2016 FINDINGS: The lungs are well-aerated. Vascular congestion is noted. Increased interstitial markings raise concern for pulmonary edema. There is no evidence of pleural effusion or pneumothorax. The cardiomediastinal silhouette is mildly enlarged. No acute osseous abnormalities are seen. IMPRESSION: Vascular congestion and mild cardiomegaly. Increased interstitial markings raise concern for pulmonary edema. Electronically Signed   By: Garald Balding M.D.   On: 06/18/2016 06:46     Labs: Basic Metabolic Panel:  Recent Labs Lab 06/18/16 0652 06/19/16 0843 06/20/16 0425  NA 130* 128* 131*  K 3.8 3.6 3.8  CL 86* 87* 95*  CO2 37* 34* 31  GLUCOSE 115* 190* 95  BUN 7 9 9   CREATININE 0.37* 0.45 <0.30*  CALCIUM 9.5 9.0 8.6*   CBC:  Recent Labs Lab 06/18/16 0652  WBC 6.7  NEUTROABS 5.0  HGB 12.3  HCT 38.0  MCV 89.2  PLT 202       Recent Labs  02/25/16 0146 06/18/16 0652  BNP 22.0 25.0   Principal Problem:   COPD exacerbation (HCC) Active Problems:   COPD with chronic bronchitis (HCC)   Pulmonary emphysema (HCC)   Diastolic dysfunction   Time coordinating discharge: 35 minutes  Signed:  Murray Hodgkins, MD [facility] 06/20/2016, 5:25 PM  By signing my name below, I, Delene Ruffini, attest that  this documentation has been prepared under the direction and in the presence of Daniel P. Sarajane Jews, MD. Electronically Signed: Delene Ruffini, Scribe.  06/20/2016 1:40pm  I personally performed the services described in this documentation. All medical record entries made by the scribe were at my direction. I have reviewed the chart and agree that the record reflects my personal performance and is accurate and complete. Murray Hodgkins, MD

## 2016-06-19 NOTE — Progress Notes (Signed)
PROGRESS NOTE  Tiffany Maxwell [id number] DOB: September 09, 1934 DOA: 06/18/2016 PCP: [name], FNP  Brief Narrative: Patient with a history of COPD, chronic hypoxic respiratory failure on 4L home O2, presented with complaints of shortness of breath. While in the ED, patient was noted to be afebrile with stable vital signs and stable oxygen saturation on home O2. She was referred for COPD exacerbation.  Assessment/Plan: 1. Acute on chronic hypoxic respiratory failure. Wears 4 L at home. Appears to be approaching baseline. 2. Hyponatremia secondary to dehydration.  3. COPD exacerbation. Resolving rapidly. 4. Chronic diastolic CHF. Despite chest x-ray findings, patient has no evidence of volume overload. 5. Tobacco use disorder in remission   Appears improved clinically, though her sodium is low. Will give fluids and check BMP.  Continue steroids, bronchodilators, and antibiotics for productive sputum. Continue supplemental oxygen.  DVT prophylaxis: lovenox Code Status: Full Family Communication: discussed with patient. No family present. Disposition Plan: Likely home 7/11  Murray Hodgkins, MD  [facility] Direct contact: [phone] --Via amion app OR  --[email or web address]; password [email or web address]  7PM-7AM contact night coverage as above 06/19/2016, 7:09 AM    Consultants:  none  Procedures:  none  Antimicrobials:  Zithromax 7/09 >>  HPI/Subjective: Has difficulty breathing at times.  Objective: Filed Vitals:   06/18/16 1953 06/18/16 2230 06/19/16 0356 06/19/16 0615  BP: 147/62 97/60  160/64  Pulse: 74 69  73  Temp: 98.5 F (36.9 C) 98.6 F (37 C)  98.7 F (37.1 C)  TempSrc: Oral Oral  Oral  Resp: 20 18  17   Height:      Weight:      SpO2: 96% 100% 95% 97%    Intake/Output Summary (Last 24 hours) at 06/19/16 0709 Last data filed at 06/18/16 1735  Gross per 24 hour  Intake    240 ml  Output      0 ml  Net    240 ml     Filed Weights   06/18/16  0549 06/18/16 1706  Weight: 54.432 kg (120 lb) 56.2 kg (123 lb 14.4 oz)    Exam:    Constitutional:  . Appears calm and comfortable Respiratory:  . CTA bilaterally, no w/r/r.  . Respiratory effort normal. No retractions or accessory muscle use Cardiovascular:  . RRR, no m/r/g . No LE extremity edema   . Telemetry SR Abdomen:  . Abdomen appears normal; no tenderness or masses, nondistended Psychiatric:  . judgement and insight appear normal . Mental status o Mood, affect appropriate  I have personally reviewed following labs and imaging studies:  Sodium 128, decreasing  Chloride lower 87  Potassium 3.6  Glucose 190  BUN and Cr unremarkable  Scheduled Meds: . antiseptic oral rinse  7 mL Mouth Rinse BID  . aspirin EC  81 mg Oral Daily  . azithromycin  500 mg Oral Daily   Followed by  . azithromycin  250 mg Oral Daily  . citalopram  20 mg Oral Daily  . ipratropium-albuterol  3 mL Nebulization QID  . methylPREDNISolone (SOLU-MEDROL) injection  40 mg Intravenous Q12H  . sodium chloride flush  3 mL Intravenous Q12H  . sodium chloride flush  3 mL Intravenous Q12H   Continuous Infusions:   Principal Problem:   COPD exacerbation (HCC) Active Problems:   COPD with chronic bronchitis (HCC)   Pulmonary emphysema (HCC)   Diastolic dysfunction     Time spent 20 minutes  By signing my name below, I, Delene Ruffini, attest that this documentation  has been prepared under the direction and in the presence of Oluwatomisin Hustead P. Sarajane Jews, MD. Electronically Signed: Delene Ruffini, Scribe.  06/19/2016 10:45am     I personally performed the services described in this documentation. All medical record entries made by the scribe were at my direction. I have reviewed the chart and agree that the record reflects my personal performance and is accurate and complete. Murray Hodgkins, MD

## 2016-06-19 NOTE — Care Management Note (Signed)
Case Management Note  Patient Details  Name: Tiffany Maxwell MRN: [id number] Date of Birth: [birth year]  Subjective/Objective:  Patient is from home, son lives with her. She reports using a walker at home. She uses home oxygen, provided by [facility]. Denies that she has any other home health services. She has a PCP, family drives her to appointments. She reports no issues obtaining medications.                   Action/Plan: DC home with self care. No CM needs identified.    Expected Discharge Date:      06/19/2016            Expected Discharge Plan:  Home/Self Care  In-House Referral:  NA  Discharge planning Services  CM Consult  Post Acute Care Choice:  NA Choice offered to:     DME Arranged:    DME Agency:     HH Arranged:    HH Agency:     Status of Service:  Completed, signed off  If discussed at [employer] of Stay Meetings, dates discussed:    Additional Comments:  Cynde Menard, Chauncey Reading, RN 06/19/2016, 10:41 AM

## 2016-06-20 ENCOUNTER — Telehealth: Payer: Self-pay | Admitting: Nurse Practitioner

## 2016-06-20 DIAGNOSIS — I5032 Chronic diastolic (congestive) heart failure: Secondary | ICD-10-CM

## 2016-06-20 LAB — BASIC METABOLIC PANEL
Anion gap: 5 (ref 5–15)
BUN: 9 mg/dL (ref 6–20)
CHLORIDE: 95 mmol/L — AB (ref 101–111)
CO2: 31 mmol/L (ref 22–32)
Calcium: 8.6 mg/dL — ABNORMAL LOW (ref 8.9–10.3)
GLUCOSE: 95 mg/dL (ref 65–99)
POTASSIUM: 3.8 mmol/L (ref 3.5–5.1)
SODIUM: 131 mmol/L — AB (ref 135–145)

## 2016-06-20 MED ORDER — PREDNISONE 10 MG PO TABS: ORAL_TABLET | ORAL | Status: DC

## 2016-06-20 MED ORDER — AZITHROMYCIN 250 MG PO TABS: 250.0000 mg | ORAL_TABLET | Freq: Every day | ORAL | Status: DC

## 2016-06-20 MED ORDER — ALBUTEROL SULFATE HFA 108 (90 BASE) MCG/ACT IN AERS: 1.0000 | INHALATION_SPRAY | Freq: Four times a day (QID) | RESPIRATORY_TRACT | Status: DC | PRN

## 2016-06-20 MED ORDER — IPRATROPIUM-ALBUTEROL 20-100 MCG/ACT IN AERS: 1.0000 | INHALATION_SPRAY | Freq: Four times a day (QID) | RESPIRATORY_TRACT | Status: DC

## 2016-06-20 NOTE — Progress Notes (Signed)
Tiffany Maxwell discharged Home per MD order.  Discharge instructions reviewed and discussed with the patient and son, all questions and concerns answered. Copy of instructions and scripts given to patient.    Medication List    TAKE these medications        albuterol 108 (90 Base) MCG/ACT inhaler  Commonly known as:  VENTOLIN HFA  Inhale 1 puff into the lungs every 6 (six) hours as needed for wheezing or shortness of breath.     aspirin EC 81 MG tablet  Take 81 mg by mouth daily.     azithromycin 250 MG tablet  Commonly known as:  ZITHROMAX  Take 1 tablet (250 mg total) by mouth daily. Start 7/12 in the morning.     benzonatate 100 MG capsule  Commonly known as:  TESSALON  Take 1 capsule (100 mg total) by mouth 2 (two) times daily as needed for cough.     Calcium Carbonate-Vitamin D 600-400 MG-UNIT tablet  Commonly known as:  CALCIUM 600+D3  Take 1 tablet by mouth 2 (two) times daily.     citalopram 20 MG tablet  Commonly known as:  CELEXA  Take 1 tablet (20 mg total) by mouth daily.     furosemide 20 MG tablet  Commonly known as:  LASIX  TAKE (1) TABLET DAILY FOR (EXCESSIVE) FLUID.     Ipratropium-Albuterol 20-100 MCG/ACT Aers respimat  Commonly known as:  COMBIVENT RESPIMAT  Inhale 1 puff into the lungs 4 (four) times daily.     lisinopril 40 MG tablet  Commonly known as:  PRINIVIL,ZESTRIL  TAKE 1 TABLET DAILY     polyethylene glycol powder powder  Commonly known as:  GLYCOLAX/MIRALAX  17 GRAMS (1 CAPFUL) ONCE A DAY AS DIRECTED     potassium chloride SA 20 MEQ tablet  Commonly known as:  K-DUR,KLOR-CON  Take 1 tablet (20 mEq total) by mouth daily.     predniSONE 10 MG tablet  Commonly known as:  DELTASONE  Take daily by mouth: 20 mg x3 days, then 10 mg x3 days, then stop.        Patients skin is clean, dry and intact, no evidence of skin break down. IV site discontinued and catheter remains intact. Site without signs and symptoms of complications.  Dressing and pressure applied.  Patient escorted to car by NT in a wheelchair,  no distress noted upon discharge.  Ralene Muskrat [name] 06/20/2016 4:13 PM

## 2016-06-20 NOTE — Telephone Encounter (Signed)
Pt given appt 06/29/16 at 8:00.

## 2016-06-20 NOTE — Consult Note (Signed)
   [facility] CM Inpatient Consult   06/20/2016  Tiffany Maxwell [birth year] [id number]  Spoke with patient and her daughter, [name] at length regarding [facility] services. Patient does not want to participate with [facility] at this time. Patient given [facility] brochure and contact information for future reference, voices appreciation of information.    Inpatient case manager aware that patient offered [facility] case management services but declined.   Of note, [facility] Care Management services would not replace or interfere with any services that are arranged by inpatient case management or social work.  For additional questions or referrals please contact:   Royetta Crochet. Laymond Purser, RN, BSN, [place] [facility] Liaison [phone]

## 2016-06-20 NOTE — Progress Notes (Signed)
PROGRESS NOTE  Tiffany Maxwell [id number] DOB: [birth year] DOA: 06/18/2016 PCP: [name], FNP  Brief Narrative: Patient with a history of COPD, chronic hypoxic respiratory failure on 4L home O2, presented with complaints of shortness of breath. While in the ED, patient was noted to be afebrile with stable vital signs and stable oxygen saturation on home O2. She was referred for COPD exacerbation. Respiratory status improved and returned to baseline with steroids, nebs and antibiotics. She was noted to be hyponatremic; however, this was corrected with normal saline.   Assessment/Plan: 1. Acute on chronic hypoxic respiratory failure. Acute issues resolved. 2. Hyponatremia secondary to dehydration.  Improved with IVF. Asymptomatic. 3. COPD exacerbation. Resolved 4. Chronic diastolic CHF. Appears compensated 5. Tobacco use disorder in remission   Appears to be at baseline. Home on steroid taper and antibiotics. Refill bronchodilators.  Murray Hodgkins, MD  [facility] Direct contact: [phone] --Via amion app OR  --[email or web address]; password [email or web address]  7PM-7AM contact night coverage as above 06/20/2016, 7:30 AM  LOS: 1 day   Consultants:  none  Procedures:  none  Antimicrobials:  Zithromax 7/09 >>  HPI/Subjective: Feels improved. Transient shortness of breath. Eating well.  Objective: Filed Vitals:   06/19/16 2221 06/20/16 0058 06/20/16 0633 06/20/16 0640  BP: 159/78  140/64   Pulse: 80  77   Temp: 98.6 F (37 C)  98.3 F (36.8 C)   TempSrc: Oral  Oral   Resp: 20  18   Height:      Weight:      SpO2: 94% 94% 93% 94%    Intake/Output Summary (Last 24 hours) at 06/20/16 0730 Last data filed at 06/20/16 0500  Gross per 24 hour  Intake   2100 ml  Output      0 ml  Net   2100 ml     Filed Weights   06/18/16 0549 06/18/16 1706  Weight: 54.432 kg (120 lb) 56.2 kg (123 lb 14.4 oz)    Exam:    Constitutional:  . Appears calm and  comfortable Eyes:  . PERRL and irises appear normal . Conjunctivae and lids appear normal ENMT:  . Oropharynx: tongue normal. No exudate. Respiratory:  . CTA bilaterally, no w/r/r.  . Respiratory effort normal. No retractions or accessory muscle use Cardiovascular:  . RRR, no m/r/g . No LE extremity edema    I have personally reviewed following labs and imaging studies:  Sodium 131, improving  Chloride improving 95  Potassium 3.8  Glucose 95  Scheduled Meds: . antiseptic oral rinse  7 mL Mouth Rinse BID  . aspirin EC  81 mg Oral Daily  . azithromycin  250 mg Oral Daily  . citalopram  20 mg Oral Daily  . ipratropium-albuterol  3 mL Nebulization QID  . methylPREDNISolone (SOLU-MEDROL) injection  40 mg Intravenous Q12H  . sodium chloride flush  3 mL Intravenous Q12H  . sodium chloride flush  3 mL Intravenous Q12H   Continuous Infusions: . sodium chloride 75 mL/hr at 06/20/16 0007    Principal Problem:   COPD exacerbation ([place]) Active Problems:   COPD with chronic bronchitis (HCC)   Pulmonary emphysema (HCC)   Diastolic dysfunction   LOS: 1 day      By signing my name below, I, Delene Ruffini, attest that this documentation has been prepared under the direction and in the presence of [place]. Sarajane Jews, MD. Electronically Signed: Delene Ruffini, Scribe.  06/20/2016 1:40pm  I personally performed the services described in this documentation.  All medical record entries made by the scribe were at my direction. I have reviewed the chart and agree that the record reflects my personal performance and is accurate and complete. Murray Hodgkins, MD

## 2016-06-22 ENCOUNTER — Telehealth: Payer: Self-pay | Admitting: *Deleted

## 2016-06-29 ENCOUNTER — Encounter: Payer: Self-pay | Admitting: Nurse Practitioner

## 2016-06-29 ENCOUNTER — Ambulatory Visit (INDEPENDENT_AMBULATORY_CARE_PROVIDER_SITE_OTHER): Payer: Medicare Other | Admitting: Nurse Practitioner

## 2016-06-29 VITALS — BP 144/88 | HR 69 | Temp 97.6°F | Ht 61.0 in | Wt 123.0 lb

## 2016-06-29 DIAGNOSIS — Z09 Encounter for follow-up examination after completed treatment for conditions other than malignant neoplasm: Secondary | ICD-10-CM

## 2016-06-29 DIAGNOSIS — J441 Chronic obstructive pulmonary disease with (acute) exacerbation: Secondary | ICD-10-CM | POA: Diagnosis not present

## 2016-06-29 NOTE — Progress Notes (Signed)
   Subjective:    Patient ID: Tiffany Maxwell, female    DOB: [birth year], 82 y.o.   MRN: [id number]  HPI Patient comes in today for hospital follow up. SHe was admitted to hospital on 06/18/16 for hypoxic respiratory failure on 4l of O2 and COPD exacerbation- was discharged home on 06/20/16. Doing much better. Is still on on 4L O2 at home. SHe denies worsening SOB. NO confusion.    Review of Systems  Constitutional: Negative.   HENT: Negative.   Respiratory: Negative.   Cardiovascular: Negative.   Genitourinary: Negative.   Neurological: Negative.   Psychiatric/Behavioral: Negative.   All other systems reviewed and are negative.      Objective:   Physical Exam  Constitutional: She is oriented to person, place, and time. She appears well-developed and well-nourished. No distress.  Cardiovascular: Normal rate, regular rhythm and normal heart sounds.   Pulmonary/Chest: Effort normal. She has wheezes (slight exp wheezes throughout).  Neurological: She is alert and oriented to person, place, and time.  Skin: Skin is warm.  Psychiatric: She has a normal mood and affect. Her behavior is normal. Judgment and thought content normal.    BP 144/88 mmHg  Pulse 69  Temp(Src) 97.6 F (36.4 C) (Oral)  Ht 5\' 1"  (1.549 m)  Wt 123 lb (55.792 kg)  BMI 23.25 kg/m2        Assessment & Plan:   1. Hospital discharge follow-up   2. COPD exacerbation [facility])    Reviewed hospital records Continue O2 at 4L at home RTO for routine follow up next month  Mary-Margaret Hassell Done, FNP

## 2016-06-29 NOTE — Patient Instructions (Signed)
Oxygen Use at Home  Oxygen can be prescribed for home use. The prescription will show the flow rate. This is how much oxygen is to be used per minute. This will be listed in liters per minute (LPM or L/M). A liter is a metric measurement of volume.  You will use oxygen therapy as directed. It can be used while exercising, sleeping, or at rest. You may need oxygen continuously. Your health care provider may order a blood oxygen test (arterial blood gas or pulse oximetry test) that will show what your oxygen level is. Your health care provider will use these measurements to learn about your needs and follow your progress.  Home oxygen therapy is commonly used on patients with various lung (pulmonary) related conditions. Some of these conditions include:  · Asthma.  · Lung cancer.  · Pneumonia.  · Emphysema.  · Chronic bronchitis.  · Cystic fibrosis.  · Other lung diseases.  · Pulmonary fibrosis.  · Occupational lung disease.  · Heart failure.  · Chronic obstructive pulmonary disease (COPD).  3 COMMON WAYS OF PROVIDING OXYGEN THERAPY  · Gas: The gas form of oxygen is put into variously sized cylinders or tanks. The cylinders or oxygen tanks contain compressed oxygen. The cylinder is equipped with a regulator that controls the flow rate. Because the flow of oxygen out of the cylinder is constant, an oxygen conserving device may be attached to the system to avoid waste. This device releases the gas only when you inhale and cuts it off when you exhale. Oxygen can be provided in a small cylinder that can be carried with you. Large tanks are heavy and are only for stationary use. After use, empty tanks must be exchanged for full tanks.  · Liquid: The liquid form of oxygen is put into a container similar to a thermos. When released, the liquid converts to a gas and you breathe it in just like the compressed gas. This storage method takes up less space than the compressed gas cylinder, and you can transfer the liquid to a  small, portable vessel at home. Liquid oxygen is more expensive than the compressed gas, and the vessel vents when not in use. An oxygen conserving device may be built into the vessel to conserve the oxygen. Liquid oxygen is very cold, around 297° below zero.  · Oxygen concentrator: This medical device filters oxygen from room air and gives almost 100% oxygen to the patient. Oxygen concentrators are powered by electricity. Benefits of this system are:    It does not need to be resupplied.    It is not as costly as liquid oxygen.    Extra tubing permits the user to move around easier.  There are several types of small, portable oxygen systems available which can help you remain active and mobile. You must have a cylinder of oxygen as a backup in the event of a power failure. Advise your electric power company that you are on oxygen therapy in order to get priority service when there is a power failure.  OXYGEN DELIVERY DEVICES  There are 3 common ways to deliver oxygen to your body.  · Nasal cannula. This is a 2-pronged device inserted in the nostrils that is connected to tubing carrying the oxygen. The tubing can rest on the ears or be attached to the frame of eyeglasses.  · Mask. People who need a high flow of oxygen generally use a mask.  · Transtracheal catheter. Transtracheal oxygen therapy requires the insertion of   a small, flexible tube (catheter) in the windpipe (trachea). This catheter is held in place by a necklace. Since transtracheal oxygen bypasses the mouth, nose, and throat, a humidifier is absolutely required at flow rates of 1 LPM or greater.  OXYGEN USE SAFETY TIPS  · Never smoke while using oxygen. Oxygen does not burn or explode, but flammable materials will burn faster in the presence of oxygen.  · Keep a fire extinguisher close by. Let your fire department know that you have oxygen in your home.  · Warn visitors not to smoke near you when you are using oxygen. Put up "no smoking" signs in your  home where you most often use the oxygen.  · When you go to a restaurant with your portable oxygen source, ask to be seated in the nonsmoking section.  · Stay at least 5 feet away from gas stoves, candles, lighted fireplaces, or other heat sources.  · Do not use materials that burn easily (flammable) while using your oxygen.  · If you use an oxygen cylinder, make sure it is secured to some fixed object or in a stand. If you use liquid oxygen, make sure the vessel is kept upright to keep the oxygen from pouring out. Liquid oxygen is so cold it can hurt your skin.  · If you use an oxygen concentrator, call your electric company so you will be given priority service if your power goes out. Avoid using extension cords, if possible.  · Regularly test your smoke detectors at home to make sure they work. If you receive care in your home from a nurse or other health care provider, he or she may also check to make sure your smoke detectors work.  GUIDELINES FOR CLEANING YOUR EQUIPMENT  · Wash the nasal prongs with a liquid soap. Thoroughly rinse them once or twice a week.  · Replace the prongs every 2 to 4 weeks. If you have an infection (cold, pneumonia) change them when you are well.  · Your health care provider will give you instructions on how to clean your transtracheal catheter.  · The humidifier bottle should be washed with soap and warm water and rinsed thoroughly between each refill. Air-dry the bottle before filling it with sterile or distilled water. The bottle and its top should be disinfected after they are cleaned.  · If you use an oxygen concentrator, unplug the unit. Then wipe down the cabinet with a damp cloth and dry it daily. The air filter should be cleaned at least twice a week.  · Follow your home medical equipment and service company's directions for cleaning the compressor filter.  HOME CARE INSTRUCTIONS   · Do not change the flow of oxygen unless directed by your health care provider.  · Do not use  alcohol or other sedating drugs unless instructed. They slow your breathing rate.  · Do not use materials that burn easily (flammable) while using your oxygen.  · Always keep a spare tank of oxygen. Plan ahead for holidays when you may not be able to get a prescription filled.  · Use water-based lubricants on your lips or nostrils. Do not use an oil-based product like petroleum jelly.  · To prevent your cheeks or the skin behind your ears from becoming irritated, tuck some gauze under the tubing.  · If you have persistent redness under your nose, call your health care provider.  · When you no longer need oxygen, your doctor will have the oxygen discontinued. Oxygen is   not addicting or habit forming.  · Use the oxygen as instructed. Too much oxygen can be harmful and too little will not give you the benefit you need.  · Shortness of breath is not always from a lack of oxygen. If your oxygen level is not the cause of your shortness of breath, taking oxygen will not help.  SEEK MEDICAL CARE IF:   · You have frequent headaches.  · You have shortness of breath or a lasting cough.  · You have anxiety.  · You are confused.  · You are drowsy or sleepy all the time.  · You develop an illness which aggravates your breathing.  · You cannot exercise.  · You are restless.  · You have blue lips or fingernails.  · You have difficult or irregular breathing and it is getting worse.  · You have a fever.     This information is not intended to replace advice given to you by your health care provider. Make sure you discuss any questions you have with your health care provider.     Document Released: 02/17/2004 Document Revised: 12/18/2014 Document Reviewed: 07/09/2013  Elsevier Interactive Patient Education ©2016 [employer]

## 2016-07-03 ENCOUNTER — Other Ambulatory Visit: Payer: Self-pay | Admitting: Nurse Practitioner

## 2016-07-26 ENCOUNTER — Encounter: Payer: Self-pay | Admitting: Nurse Practitioner

## 2016-07-26 ENCOUNTER — Ambulatory Visit (INDEPENDENT_AMBULATORY_CARE_PROVIDER_SITE_OTHER): Payer: Medicare Other | Admitting: Nurse Practitioner

## 2016-07-26 VITALS — BP 125/77 | HR 72 | Temp 97.1°F | Ht 61.0 in | Wt 120.0 lb

## 2016-07-26 DIAGNOSIS — I10 Essential (primary) hypertension: Secondary | ICD-10-CM

## 2016-07-26 DIAGNOSIS — E876 Hypokalemia: Secondary | ICD-10-CM

## 2016-07-26 DIAGNOSIS — J439 Emphysema, unspecified: Secondary | ICD-10-CM | POA: Diagnosis not present

## 2016-07-26 DIAGNOSIS — S22009D Unspecified fracture of unspecified thoracic vertebra, subsequent encounter for fracture with routine healing: Secondary | ICD-10-CM

## 2016-07-26 DIAGNOSIS — J449 Chronic obstructive pulmonary disease, unspecified: Secondary | ICD-10-CM | POA: Diagnosis not present

## 2016-07-26 DIAGNOSIS — M810 Age-related osteoporosis without current pathological fracture: Secondary | ICD-10-CM

## 2016-07-26 DIAGNOSIS — IMO0002 Reserved for concepts with insufficient information to code with codable children: Secondary | ICD-10-CM

## 2016-07-26 NOTE — Patient Instructions (Signed)
Fall Prevention in the Home  Falls can cause injuries and can affect people from all age groups. There are many simple things that you can do to make your home safe and to help prevent falls. WHAT CAN I DO ON THE OUTSIDE OF MY HOME?  Regularly repair the edges of walkways and driveways and fix any cracks.  Remove high doorway thresholds.  Trim any shrubbery on the main path into your home.  Use bright outdoor lighting.  Clear walkways of debris and clutter, including tools and rocks.  Regularly check that handrails are securely fastened and in good repair. Both sides of any steps should have handrails.  Install guardrails along the edges of any raised decks or porches.  Have leaves, snow, and ice cleared regularly.  Use sand or salt on walkways during winter months.  In the garage, clean up any spills right away, including grease or oil spills. WHAT CAN I DO IN THE BATHROOM?  Use night lights.  Install grab bars by the toilet and in the tub and shower. Do not use towel bars as grab bars.  Use non-skid mats or decals on the floor of the tub or shower.  If you need to sit down while you are in the shower, use a plastic, non-slip stool..  Keep the floor dry. Immediately clean up any water that spills on the floor.  Remove soap buildup in the tub or shower on a regular basis.  Attach bath mats securely with double-sided non-slip rug tape.  Remove throw rugs and other tripping hazards from the floor. WHAT CAN I DO IN THE BEDROOM?  Use night lights.  Make sure that a bedside light is easy to reach.  Do not use oversized bedding that drapes onto the floor.  Have a firm chair that has side arms to use for getting dressed.  Remove throw rugs and other tripping hazards from the floor. WHAT CAN I DO IN THE KITCHEN?   Clean up any spills right away.  Avoid walking on wet floors.  Place frequently used items in easy-to-reach places.  If you need to reach for something  above you, use a sturdy step stool that has a grab bar.  Keep electrical cables out of the way.  Do not use floor polish or wax that makes floors slippery. If you have to use wax, make sure that it is non-skid floor wax.  Remove throw rugs and other tripping hazards from the floor. WHAT CAN I DO IN THE STAIRWAYS?  Do not leave any items on the stairs.  Make sure that there are handrails on both sides of the stairs. Fix handrails that are broken or loose. Make sure that handrails are as long as the stairways.  Check any carpeting to make sure that it is firmly attached to the stairs. Fix any carpet that is loose or worn.  Avoid having throw rugs at the top or bottom of stairways, or secure the rugs with carpet tape to prevent them from moving.  Make sure that you have a light switch at the top of the stairs and the bottom of the stairs. If you do not have them, have them installed. WHAT ARE SOME OTHER FALL PREVENTION TIPS?  Wear closed-toe shoes that fit well and support your feet. Wear shoes that have rubber soles or low heels.  When you use a stepladder, make sure that it is completely opened and that the sides are firmly locked. Have someone hold the ladder while you   are using it. Do not climb a closed stepladder.  Add color or contrast paint or tape to grab bars and handrails in your home. Place contrasting color strips on the first and last steps.  Use mobility aids as needed, such as canes, walkers, scooters, and crutches.  Turn on lights if it is dark. Replace any light bulbs that burn out.  Set up furniture so that there are clear paths. Keep the furniture in the same spot.  Fix any uneven floor surfaces.  Choose a carpet design that does not hide the edge of steps of a stairway.  Be aware of any and all pets.  Review your medicines with your healthcare provider. Some medicines can cause dizziness or changes in blood pressure, which increase your risk of falling. Talk  with your health care provider about other ways that you can decrease your risk of falls. This may include working with a physical therapist or trainer to improve your strength, balance, and endurance.   This information is not intended to replace advice given to you by your health care provider. Make sure you discuss any questions you have with your health care provider.   Document Released: 11/17/2002 Document Revised: 04/13/2015 Document Reviewed: 01/01/2015 Elsevier Interactive Patient Education 2016 [employer] Inc.  

## 2016-07-26 NOTE — Progress Notes (Signed)
 Subjective:    Patient ID: Tiffany Maxwell, female    DOB: 03/10/1934, 82 y.o.   MRN: [id number]   Patient here today for follow up of chronic medical problems. NO changes since last visit.  Outpatient Encounter Prescriptions as of 04/24/2016  Medication Sig  . aspirin EC 81 MG tablet Take 81 mg by mouth daily.  . azithromycin (ZITHROMAX) 250 MG tablet Take 1 tablet (250 mg total) by mouth daily. Take first 2 tablets together, then 1 every day until finished.  . benzonatate (TESSALON) 100 MG capsule TAKE 1 CAPSULE TWICE DAILY AS NEEDED FOR COUGH  . Calcium Carbonate-Vitamin D (CALCIUM 600+D3) 600-400 MG-UNIT per tablet Take 1 tablet by mouth 2 (two) times daily. (Patient taking differently: Take 1 tablet by mouth daily. )  . COMBIVENT RESPIMAT 20-100 MCG/ACT AERS respimat 2 PUFFS EVERY 6 HOURS AS NEEDED FOR WHEEZING. NO MORE THAN 6 PUFFS PER DAY  . furosemide (LASIX) 20 MG tablet TAKE (1) TABLET DAILY FOR (EXCESSIVE) FLUID.  . lisinopril (PRINIVIL,ZESTRIL) 40 MG tablet TAKE 1 TABLET DAILY  . polyethylene glycol powder (GLYCOLAX/MIRALAX) powder 17 GRAMS (1 CAPFUL) ONCE A DAY AS DIRECTED  . potassium chloride SA (K-DUR,KLOR-CON) 20 MEQ tablet Take 1 tablet (20 mEq total) by mouth daily.  . VENTOLIN HFA 108 (90 Base) MCG/ACT inhaler 2 PUFFS EVERY 6 HOURS AS NEEDED FOR WHEEZING OR SHORTNESS OF BREATH        Hypertension  This is a chronic problem. The current episode started more than 1 year ago. The problem is unchanged. The problem is controlled. Pertinent negatives include no chest pain, headaches, neck pain, palpitations or shortness of breath. Risk factors for coronary artery disease include post-menopausal state. Past treatments include ACE inhibitors and diuretics. The current treatment provides moderate improvement. Compliance problems include diet and exercise.   COPD- Hypoxemia Patient has a long history of COPD and is on continuous oxygen at home- The only inhaler she currently uses  is combivent. SHe says that she has been doing well as long as she stays on her oxygen. Hypokalemia Patient is currently on kdur- denies any cramping of lower ext. Hyponatremia Patient just adds a little Na to diet 1-2X a week- no c/o fatigue. Chronic back pain/compression fractures of back/osteoporosis norco on an as needed basis- usually takes at least 1x a day- walking with a walker due to back pain.   Review of Systems  Constitutional: Negative.   HENT: Negative.   Respiratory: Negative for shortness of breath.   Cardiovascular: Negative for chest pain and palpitations.  Genitourinary: Negative.   Musculoskeletal: Negative for neck pain.  Neurological: Negative for headaches.  Psychiatric/Behavioral: Negative.   All other systems reviewed and are negative.                                                                                                                                                        Objective:   Physical Exam  Constitutional: She is oriented to person, place, and time. She appears well-developed and well-nourished.  HENT:  Nose: Nose normal.  Mouth/Throat: Oropharynx is clear and moist.  Eyes: EOM are normal.  Neck: Trachea normal, normal range of motion and full passive range of motion without pain. Neck supple. No JVD present. Carotid bruit is not present. No thyromegaly present.  Cardiovascular: Normal rate, regular rhythm, normal heart sounds and intact distal pulses.  Exam reveals no gallop and no friction rub.   No murmur heard. Pulmonary/Chest: Effort normal.  O2 via nasal cannnulla Diminished breath sounds throughout lung fields  Abdominal: Soft. Bowel sounds are normal. She exhibits no distension and no mass. There is no tenderness.  Musculoskeletal: Normal range of motion.  Walking with a walker  Lymphadenopathy:    She has no cervical adenopathy.  Neurological: She is alert and oriented to person, place, and time. She has normal reflexes.    Skin: Skin is warm and dry.  Psychiatric: She has a normal mood and affect. Her behavior is normal. Judgment and thought content normal.   BP 125/77   Pulse 72   Temp 97.1 F (36.2 C) (Oral)   Ht '5\' 1"'$  (1.549 m)   Wt 120 lb (54.4 kg)   BMI 22.67 kg/m       Assessment & Plan:  1. COPD with chronic bronchitis (HCC) Continue O2 via nasal cannula at all times  2. Pulmonary emphysema, unspecified emphysema type ([place])  3. Hypokalemia Will check labs today  4. Essential hypertension Do not add saltt o diet - CMP14+EGFR - Lipid panel  5. Compression fracture Fall preventions  6. Osteoporosis  7. Multiple fractures of thoracic spine, closed, with routine healing, subsequent encounter    Labs pending Health maintenance reviewed Diet and exercise encouraged Continue all meds Follow up  In 3 months   [place], FNP

## 2016-07-27 ENCOUNTER — Other Ambulatory Visit: Payer: Self-pay | Admitting: Nurse Practitioner

## 2016-07-27 LAB — CMP14+EGFR
ALBUMIN: 4.1 g/dL (ref 3.5–4.7)
ALT: 6 IU/L (ref 0–32)
AST: 16 IU/L (ref 0–40)
Albumin/Globulin Ratio: 1.8 (ref 1.2–2.2)
Alkaline Phosphatase: 101 IU/L (ref 39–117)
BILIRUBIN TOTAL: 0.4 mg/dL (ref 0.0–1.2)
BUN / CREAT RATIO: 12 (ref 12–28)
BUN: 5 mg/dL — AB (ref 8–27)
CALCIUM: 10 mg/dL (ref 8.7–10.3)
CHLORIDE: 87 mmol/L — AB (ref 96–106)
CO2: 29 mmol/L (ref 18–29)
CREATININE: 0.43 mg/dL — AB (ref 0.57–1.00)
GFR, EST AFRICAN AMERICAN: 111 mL/min/{1.73_m2} (ref >59)
GFR, EST NON AFRICAN AMERICAN: 96 mL/min/{1.73_m2} (ref >59)
GLUCOSE: 84 mg/dL (ref 65–99)
Globulin, Total: 2.3 g/dL (ref 1.5–4.5)
Potassium: 5 mmol/L (ref 3.5–5.2)
Sodium: 132 mmol/L — ABNORMAL LOW (ref 134–144)
TOTAL PROTEIN: 6.4 g/dL (ref 6.0–8.5)

## 2016-07-27 LAB — LIPID PANEL
CHOL/HDL RATIO: 2.5 ratio (ref 0.0–4.4)
Cholesterol, Total: 226 mg/dL — ABNORMAL HIGH (ref 100–199)
HDL: 89 mg/dL (ref >39)
LDL CALC: 121 mg/dL — AB (ref 0–99)
Triglycerides: 81 mg/dL (ref 0–149)
VLDL CHOLESTEROL CAL: 16 mg/dL (ref 5–40)

## 2016-07-27 NOTE — Telephone Encounter (Signed)
Unable to complete TCM phone call

## 2016-07-28 NOTE — Progress Notes (Signed)
Patient aware.

## 2016-08-15 ENCOUNTER — Other Ambulatory Visit: Payer: Self-pay | Admitting: Nurse Practitioner

## 2016-08-24 ENCOUNTER — Ambulatory Visit (INDEPENDENT_AMBULATORY_CARE_PROVIDER_SITE_OTHER): Payer: Medicare Other | Admitting: Nurse Practitioner

## 2016-08-24 ENCOUNTER — Encounter: Payer: Self-pay | Admitting: Nurse Practitioner

## 2016-08-24 VITALS — BP 140/69 | HR 78 | Temp 97.5°F | Ht 61.0 in | Wt 119.0 lb

## 2016-08-24 DIAGNOSIS — L03114 Cellulitis of left upper limb: Secondary | ICD-10-CM | POA: Diagnosis not present

## 2016-08-24 MED ORDER — SULFAMETHOXAZOLE-TRIMETHOPRIM 800-160 MG PO TABS: 1.0000 | ORAL_TABLET | Freq: Two times a day (BID) | ORAL | 0 refills | Status: DC

## 2016-08-24 NOTE — Progress Notes (Signed)
   Subjective:    Patient ID: Tiffany Maxwell, female    DOB: 03-10-1934, 82 y.o.   MRN: [id number]  HPI Patient brought in by her sister today.Patient c/o sore place on left forarm. Noticed it about 3 weeks ago- has gotten bigger nad is sore.    Review of Systems  Constitutional: Negative.   HENT: Negative.   Respiratory: Negative.   Cardiovascular: Negative.   Genitourinary: Negative.   Neurological: Negative.   Psychiatric/Behavioral: Negative.   All other systems reviewed and are negative.      Objective:   Physical Exam  Constitutional: She is oriented to person, place, and time. She appears well-developed and well-nourished.  Cardiovascular: Normal rate, regular rhythm and normal heart sounds.   Pulmonary/Chest: Effort normal and breath sounds normal.  Neurological: She is alert and oriented to person, place, and time.  Skin: Skin is warm.  3cm soft raised abscess to left forearm. Slightly sore to touch.   BP 140/69   Pulse 78   Temp 97.5 F (36.4 C) (Oral)   Ht 5\' 1"  (1.549 m)   Wt 119 lb (54 kg)   BMI 22.48 kg/m        Assessment & Plan:   1. Cellulitis of arm, left    Meds ordered this encounter  Medications  . sulfamethoxazole-trimethoprim (BACTRIM DS) 800-160 MG tablet    Sig: Take 1 tablet by mouth 2 (two) times daily.    Dispense:  20 tablet    Refill:  0    Order Specific Question:   Supervising Provider    Answer:   Evette Doffing, CAROL L [[id number]]   Warm compresses Squeeze if starts draining RTO if not improving  Mary-Margaret Hassell Done, FNP

## 2016-08-24 NOTE — Patient Instructions (Signed)

## 2016-09-02 ENCOUNTER — Other Ambulatory Visit: Payer: Self-pay | Admitting: Nurse Practitioner

## 2016-09-04 ENCOUNTER — Other Ambulatory Visit: Payer: Self-pay | Admitting: Nurse Practitioner

## 2016-09-04 DIAGNOSIS — J449 Chronic obstructive pulmonary disease, unspecified: Secondary | ICD-10-CM

## 2016-09-14 ENCOUNTER — Other Ambulatory Visit: Payer: Self-pay | Admitting: Nurse Practitioner

## 2016-09-22 ENCOUNTER — Other Ambulatory Visit: Payer: Self-pay | Admitting: Nurse Practitioner

## 2016-09-22 DIAGNOSIS — F411 Generalized anxiety disorder: Secondary | ICD-10-CM

## 2016-09-22 DIAGNOSIS — E876 Hypokalemia: Secondary | ICD-10-CM

## 2016-09-22 DIAGNOSIS — I10 Essential (primary) hypertension: Secondary | ICD-10-CM

## 2016-10-16 ENCOUNTER — Other Ambulatory Visit: Payer: Self-pay | Admitting: Nurse Practitioner

## 2016-10-30 ENCOUNTER — Other Ambulatory Visit: Payer: Self-pay | Admitting: Nurse Practitioner

## 2016-11-06 ENCOUNTER — Ambulatory Visit (INDEPENDENT_AMBULATORY_CARE_PROVIDER_SITE_OTHER): Payer: Medicare Other | Admitting: Nurse Practitioner

## 2016-11-06 ENCOUNTER — Encounter: Payer: Self-pay | Admitting: Nurse Practitioner

## 2016-11-06 ENCOUNTER — Encounter (INDEPENDENT_AMBULATORY_CARE_PROVIDER_SITE_OTHER): Payer: Self-pay

## 2016-11-06 VITALS — BP 134/76 | HR 77 | Temp 97.1°F | Ht 61.0 in | Wt 118.0 lb

## 2016-11-06 DIAGNOSIS — Z23 Encounter for immunization: Secondary | ICD-10-CM

## 2016-11-06 DIAGNOSIS — F411 Generalized anxiety disorder: Secondary | ICD-10-CM | POA: Insufficient documentation

## 2016-11-06 DIAGNOSIS — E876 Hypokalemia: Secondary | ICD-10-CM

## 2016-11-06 DIAGNOSIS — M810 Age-related osteoporosis without current pathological fracture: Secondary | ICD-10-CM | POA: Diagnosis not present

## 2016-11-06 DIAGNOSIS — J439 Emphysema, unspecified: Secondary | ICD-10-CM

## 2016-11-06 DIAGNOSIS — I10 Essential (primary) hypertension: Secondary | ICD-10-CM | POA: Diagnosis not present

## 2016-11-06 DIAGNOSIS — J449 Chronic obstructive pulmonary disease, unspecified: Secondary | ICD-10-CM

## 2016-11-06 MED ORDER — CITALOPRAM HYDROBROMIDE 40 MG PO TABS: 40.0000 mg | ORAL_TABLET | Freq: Every day | ORAL | 1 refills | Status: DC

## 2016-11-06 MED ORDER — POTASSIUM CHLORIDE CRYS ER 20 MEQ PO TBCR: 20.0000 meq | EXTENDED_RELEASE_TABLET | Freq: Every day | ORAL | 1 refills | Status: DC

## 2016-11-06 MED ORDER — FUROSEMIDE 20 MG PO TABS: ORAL_TABLET | ORAL | 1 refills | Status: DC

## 2016-11-06 MED ORDER — LISINOPRIL 40 MG PO TABS: 40.0000 mg | ORAL_TABLET | Freq: Every day | ORAL | 1 refills | Status: DC

## 2016-11-06 NOTE — Patient Instructions (Signed)
Fall Prevention in the Home Introduction Falls can cause injuries. They can happen to people of all ages. There are many things you can do to make your home safe and to help prevent falls. What can I do on the outside of my home?  Regularly fix the edges of walkways and driveways and fix any cracks.  Remove anything that might make you trip as you walk through a door, such as a raised step or threshold.  Trim any bushes or trees on the path to your home.  Use bright outdoor lighting.  Clear any walking paths of anything that might make someone trip, such as rocks or tools.  Regularly check to see if handrails are loose or broken. Make sure that both sides of any steps have handrails.  Any raised decks and porches should have guardrails on the edges.  Have any leaves, snow, or ice cleared regularly.  Use sand or salt on walking paths during winter.  Clean up any spills in your garage right away. This includes oil or grease spills. What can I do in the bathroom?  Use night lights.  Install grab bars by the toilet and in the tub and shower. Do not use towel bars as grab bars.  Use non-skid mats or decals in the tub or shower.  If you need to sit down in the shower, use a plastic, non-slip stool.  Keep the floor dry. Clean up any water that spills on the floor as soon as it happens.  Remove soap buildup in the tub or shower regularly.  Attach bath mats securely with double-sided non-slip rug tape.  Do not have throw rugs and other things on the floor that can make you trip. What can I do in the bedroom?  Use night lights.  Make sure that you have a light by your bed that is easy to reach.  Do not use any sheets or blankets that are too big for your bed. They should not hang down onto the floor.  Have a firm chair that has side arms. You can use this for support while you get dressed.  Do not have throw rugs and other things on the floor that can make you trip. What can  I do in the kitchen?  Clean up any spills right away.  Avoid walking on wet floors.  Keep items that you use a lot in easy-to-reach places.  If you need to reach something above you, use a strong step stool that has a grab bar.  Keep electrical cords out of the way.  Do not use floor polish or wax that makes floors slippery. If you must use wax, use non-skid floor wax.  Do not have throw rugs and other things on the floor that can make you trip. What can I do with my stairs?  Do not leave any items on the stairs.  Make sure that there are handrails on both sides of the stairs and use them. Fix handrails that are broken or loose. Make sure that handrails are as long as the stairways.  Check any carpeting to make sure that it is firmly attached to the stairs. Fix any carpet that is loose or worn.  Avoid having throw rugs at the top or bottom of the stairs. If you do have throw rugs, attach them to the floor with carpet tape.  Make sure that you have a light switch at the top of the stairs and the bottom of the stairs. If you   do not have them, ask someone to add them for you. What else can I do to help prevent falls?  Wear shoes that:  Do not have high heels.  Have rubber bottoms.  Are comfortable and fit you well.  Are closed at the toe. Do not wear sandals.  If you use a stepladder:  Make sure that it is fully opened. Do not climb a closed stepladder.  Make sure that both sides of the stepladder are locked into place.  Ask someone to hold it for you, if possible.  Clearly mark and make sure that you can see:  Any grab bars or handrails.  First and last steps.  Where the edge of each step is.  Use tools that help you move around (mobility aids) if they are needed. These include:  Canes.  Walkers.  Scooters.  Crutches.  Turn on the lights when you go into a dark area. Replace any light bulbs as soon as they burn out.  Set up your furniture so you have a  clear path. Avoid moving your furniture around.  If any of your floors are uneven, fix them.  If there are any pets around you, be aware of where they are.  Review your medicines with your doctor. Some medicines can make you feel dizzy. This can increase your chance of falling. Ask your doctor what other things that you can do to help prevent falls. This information is not intended to replace advice given to you by your health care provider. Make sure you discuss any questions you have with your health care provider. Document Released: 09/23/2009 Document Revised: 05/04/2016 Document Reviewed: 01/01/2015  2017 Elsevier  

## 2016-11-06 NOTE — Progress Notes (Signed)
 Subjective:    Patient ID: Tiffany Maxwell, female    DOB: 12/22/1933, 82 y.o.   MRN: [id number]   Patient here today for follow up of chronic medical problems. NO changes since last visit.  Outpatient Encounter Prescriptions as of 04/24/2016  Medication Sig  . aspirin EC 81 MG tablet Take 81 mg by mouth daily.  . azithromycin (ZITHROMAX) 250 MG tablet Take 1 tablet (250 mg total) by mouth daily. Take first 2 tablets together, then 1 every day until finished.  . benzonatate (TESSALON) 100 MG capsule TAKE 1 CAPSULE TWICE DAILY AS NEEDED FOR COUGH  . Calcium Carbonate-Vitamin D (CALCIUM 600+D3) 600-400 MG-UNIT per tablet Take 1 tablet by mouth 2 (two) times daily. (Patient taking differently: Take 1 tablet by mouth daily. )  . COMBIVENT RESPIMAT 20-100 MCG/ACT AERS respimat 2 PUFFS EVERY 6 HOURS AS NEEDED FOR WHEEZING. NO MORE THAN 6 PUFFS PER DAY  . furosemide (LASIX) 20 MG tablet TAKE (1) TABLET DAILY FOR (EXCESSIVE) FLUID.  . lisinopril (PRINIVIL,ZESTRIL) 40 MG tablet TAKE 1 TABLET DAILY  . polyethylene glycol powder (GLYCOLAX/MIRALAX) powder 17 GRAMS (1 CAPFUL) ONCE A DAY AS DIRECTED  . potassium chloride SA (K-DUR,KLOR-CON) 20 MEQ tablet Take 1 tablet (20 mEq total) by mouth daily.  . VENTOLIN HFA 108 (90 Base) MCG/ACT inhaler 2 PUFFS EVERY 6 HOURS AS NEEDED FOR WHEEZING OR SHORTNESS OF BREATH        Hypertension  This is a chronic problem. The current episode started more than 1 year ago. The problem is unchanged. The problem is controlled. Pertinent negatives include no chest pain, headaches, neck pain, palpitations or shortness of breath. Risk factors for coronary artery disease include post-menopausal state. Past treatments include ACE inhibitors and diuretics. The current treatment provides moderate improvement. Compliance problems include diet and exercise.   COPD- Hypoxemia Patient has a long history of COPD and is on continuous oxygen at home- The only inhaler she currently uses  is combivent. SHe says that she has been doing well as long as she stays on her oxygen. Hypokalemia Patient is currently on kdur- denies any cramping of lower ext. Hyponatremia Patient just adds a little Na to diet 1-2X a week- no c/o fatigue. Chronic back pain/compression fractures of back/osteoporosis norco on an as needed basis- usually takes at least 1x a day- walking with a walker due to back pain.   Review of Systems  Constitutional: Negative.   HENT: Negative.   Respiratory: Negative for shortness of breath.   Cardiovascular: Negative for chest pain and palpitations.  Genitourinary: Negative.   Musculoskeletal: Negative for neck pain.  Neurological: Negative for headaches.  Psychiatric/Behavioral: Negative.   All other systems reviewed and are negative.                                                                                                                                                        Objective:   Physical Exam  Constitutional: She is oriented to person, place, and time. She appears well-developed and well-nourished.  HENT:  Nose: Nose normal.  Mouth/Throat: Oropharynx is clear and moist.  Eyes: EOM are normal.  Neck: Trachea normal, normal range of motion and full passive range of motion without pain. Neck supple. No JVD present. Carotid bruit is not present. No thyromegaly present.  Cardiovascular: Normal rate, regular rhythm, normal heart sounds and intact distal pulses.  Exam reveals no gallop and no friction rub.   No murmur heard. Pulmonary/Chest: Effort normal.  O2 via nasal cannnulla Diminished breath sounds throughout lung fields  Abdominal: Soft. Bowel sounds are normal. She exhibits no distension and no mass. There is no tenderness.  Musculoskeletal: Normal range of motion.  Walking with a walker  Lymphadenopathy:    She has no cervical adenopathy.  Neurological: She is alert and oriented to person, place, and time. She has normal reflexes.    Skin: Skin is warm and dry.  Fluid filled nodule left forearm  Psychiatric: She has a normal mood and affect. Her behavior is normal. Judgment and thought content normal.   BP 134/76 (BP Location: Right Arm, Cuff Size: Normal)   Pulse 77   Temp 97.1 F (36.2 C) (Oral)   Ht 5' 1" (1.549 m)   Wt 118 lb (53.5 kg)   BMI 22.30 kg/m        Assessment & Plan:  1. Essential hypertension Low sodium diet - lisinopril (PRINIVIL,ZESTRIL) 40 MG tablet; Take 1 tablet (40 mg total) by mouth daily.  Dispense: 90 tablet; Refill: 1 - furosemide (LASIX) 20 MG tablet; TAKE (1) TABLET DAILY FOR (EXCESSIVE) FLUID.  Dispense: 90 tablet; Refill: 1 - CMP14+EGFR - Lipid panel  2. COPD with chronic bronchitis (HCC) Continue combivent inhaler  3. Pulmonary emphysema, unspecified emphysema type ([place]) Continue oxygen  4. Age-related osteoporosis without current pathological fracture Weight bearing exercise if can tolerate  5. GAD (generalized anxiety disorder) stress management Increase celexa from 94m daily to 426mdaily - citalopram (CELEXA) 40 MG tablet; Take 1 tablet (40 mg total) by mouth daily.  Dispense: 90 tablet; Refill: 1  6. Hypokalemia - potassium chloride SA (K-DUR,KLOR-CON) 20 MEQ tablet; Take 1 tablet (20 mEq total) by mouth daily.  Dispense: 90 tablet; Refill: 1    Labs pending Health maintenance reviewed Diet and exercise encouraged Continue all meds Follow up  In 3 months   [facility]'sFNP

## 2016-11-07 LAB — CMP14+EGFR
ALT: 6 IU/L (ref 0–32)
AST: 20 IU/L (ref 0–40)
Albumin/Globulin Ratio: 1.7 (ref 1.2–2.2)
Albumin: 3.9 g/dL (ref 3.5–4.7)
Alkaline Phosphatase: 74 IU/L (ref 39–117)
BUN/Creatinine Ratio: 9 — ABNORMAL LOW (ref 12–28)
BUN: 4 mg/dL — AB (ref 8–27)
Bilirubin Total: 0.5 mg/dL (ref 0.0–1.2)
CALCIUM: 10.2 mg/dL (ref 8.7–10.3)
CO2: 32 mmol/L — AB (ref 18–29)
CREATININE: 0.46 mg/dL — AB (ref 0.57–1.00)
Chloride: 87 mmol/L — ABNORMAL LOW (ref 96–106)
GFR calc Af Amer: 109 mL/min/{1.73_m2} (ref >59)
GFR, EST NON AFRICAN AMERICAN: 94 mL/min/{1.73_m2} (ref >59)
GLOBULIN, TOTAL: 2.3 g/dL (ref 1.5–4.5)
Glucose: 80 mg/dL (ref 65–99)
Potassium: 4.7 mmol/L (ref 3.5–5.2)
Sodium: 133 mmol/L — ABNORMAL LOW (ref 134–144)
Total Protein: 6.2 g/dL (ref 6.0–8.5)

## 2016-11-07 LAB — LIPID PANEL
CHOL/HDL RATIO: 2.3 ratio (ref 0.0–4.4)
Cholesterol, Total: 224 mg/dL — ABNORMAL HIGH (ref 100–199)
HDL: 98 mg/dL (ref >39)
LDL CALC: 115 mg/dL — AB (ref 0–99)
TRIGLYCERIDES: 56 mg/dL (ref 0–149)
VLDL Cholesterol Cal: 11 mg/dL (ref 5–40)

## 2016-11-16 ENCOUNTER — Other Ambulatory Visit: Payer: Self-pay | Admitting: Nurse Practitioner

## 2016-12-25 ENCOUNTER — Other Ambulatory Visit: Payer: Self-pay | Admitting: Nurse Practitioner

## 2017-01-17 ENCOUNTER — Other Ambulatory Visit: Payer: Self-pay | Admitting: Nurse Practitioner

## 2017-02-02 ENCOUNTER — Other Ambulatory Visit: Payer: Self-pay | Admitting: Nurse Practitioner

## 2017-02-07 ENCOUNTER — Ambulatory Visit (INDEPENDENT_AMBULATORY_CARE_PROVIDER_SITE_OTHER): Payer: Medicare Other | Admitting: Nurse Practitioner

## 2017-02-07 ENCOUNTER — Encounter: Payer: Self-pay | Admitting: Nurse Practitioner

## 2017-02-07 VITALS — BP 142/88 | HR 65 | Temp 98.2°F | Ht 61.0 in | Wt 112.0 lb

## 2017-02-07 DIAGNOSIS — E876 Hypokalemia: Secondary | ICD-10-CM | POA: Diagnosis not present

## 2017-02-07 DIAGNOSIS — M8000XS Age-related osteoporosis with current pathological fracture, unspecified site, sequela: Secondary | ICD-10-CM

## 2017-02-07 DIAGNOSIS — J439 Emphysema, unspecified: Secondary | ICD-10-CM

## 2017-02-07 DIAGNOSIS — F411 Generalized anxiety disorder: Secondary | ICD-10-CM | POA: Diagnosis not present

## 2017-02-07 DIAGNOSIS — I10 Essential (primary) hypertension: Secondary | ICD-10-CM | POA: Diagnosis not present

## 2017-02-07 DIAGNOSIS — J449 Chronic obstructive pulmonary disease, unspecified: Secondary | ICD-10-CM

## 2017-02-07 MED ORDER — BENZONATATE 100 MG PO CAPS: 100.0000 mg | ORAL_CAPSULE | Freq: Two times a day (BID) | ORAL | 0 refills | Status: DC | PRN

## 2017-02-07 MED ORDER — IPRATROPIUM-ALBUTEROL 20-100 MCG/ACT IN AERS: 1.0000 | INHALATION_SPRAY | Freq: Four times a day (QID) | RESPIRATORY_TRACT | 5 refills | Status: DC | PRN

## 2017-02-07 MED ORDER — POTASSIUM CHLORIDE CRYS ER 20 MEQ PO TBCR: 20.0000 meq | EXTENDED_RELEASE_TABLET | Freq: Every day | ORAL | 1 refills | Status: DC

## 2017-02-07 MED ORDER — CITALOPRAM HYDROBROMIDE 40 MG PO TABS: 40.0000 mg | ORAL_TABLET | Freq: Every day | ORAL | 1 refills | Status: DC

## 2017-02-07 MED ORDER — LISINOPRIL 40 MG PO TABS: 40.0000 mg | ORAL_TABLET | Freq: Every day | ORAL | 1 refills | Status: DC

## 2017-02-07 MED ORDER — FUROSEMIDE 20 MG PO TABS: ORAL_TABLET | ORAL | 1 refills | Status: DC

## 2017-02-07 NOTE — Addendum Note (Signed)
Addended by: Rolena Infante on: 02/07/2017 02:53 PM   Modules accepted: Orders

## 2017-02-07 NOTE — Progress Notes (Signed)
 Subjective:    Patient ID: Tiffany Maxwell, female    DOB: 03/10/1934, 83 y.o.   MRN: [id number]   Patient here today for follow up of chronic medical problems. NO changes since last visit.  Outpatient Encounter Prescriptions as of 04/24/2016  Medication Sig  . aspirin EC 81 MG tablet Take 81 mg by mouth daily.  . azithromycin (ZITHROMAX) 250 MG tablet Take 1 tablet (250 mg total) by mouth daily. Take first 2 tablets together, then 1 every day until finished.  . benzonatate (TESSALON) 100 MG capsule TAKE 1 CAPSULE TWICE DAILY AS NEEDED FOR COUGH  . Calcium Carbonate-Vitamin D (CALCIUM 600+D3) 600-400 MG-UNIT per tablet Take 1 tablet by mouth 2 (two) times daily. (Patient taking differently: Take 1 tablet by mouth daily. )  . COMBIVENT RESPIMAT 20-100 MCG/ACT AERS respimat 2 PUFFS EVERY 6 HOURS AS NEEDED FOR WHEEZING. NO MORE THAN 6 PUFFS PER DAY  . furosemide (LASIX) 20 MG tablet TAKE (1) TABLET DAILY FOR (EXCESSIVE) FLUID.  . lisinopril (PRINIVIL,ZESTRIL) 40 MG tablet TAKE 1 TABLET DAILY  . polyethylene glycol powder (GLYCOLAX/MIRALAX) powder 17 GRAMS (1 CAPFUL) ONCE A DAY AS DIRECTED  . potassium chloride SA (K-DUR,KLOR-CON) 20 MEQ tablet Take 1 tablet (20 mEq total) by mouth daily.  . VENTOLIN HFA 108 (90 Base) MCG/ACT inhaler 2 PUFFS EVERY 6 HOURS AS NEEDED FOR WHEEZING OR SHORTNESS OF BREATH        Hypertension  This is a chronic problem. The current episode started more than 1 year ago. The problem is unchanged. The problem is controlled. Pertinent negatives include no chest pain, headaches, neck pain, palpitations or shortness of breath. Risk factors for coronary artery disease include post-menopausal state. Past treatments include ACE inhibitors and diuretics. The current treatment provides moderate improvement. Compliance problems include diet and exercise.   COPD- Hypoxemia Patient has a long history of COPD and is on continuous oxygen at home- The only inhaler she currently uses  is combivent. SHe says that she has been doing well as long as she stays on her oxygen. Hypokalemia Patient is currently on kdur- denies any cramping of lower ext. Hyponatremia Patient just adds a little Na to diet 1-2X a week- no c/o fatigue. Chronic back pain/compression fractures of back/osteoporosis norco on an as needed basis- usually takes at least 1x a day- walking with a walker due to back pain.   Review of Systems  Constitutional: Negative.   HENT: Negative.   Respiratory: Negative for shortness of breath.   Cardiovascular: Negative for chest pain and palpitations.  Genitourinary: Negative.   Musculoskeletal: Negative for neck pain.  Neurological: Negative for headaches.  Psychiatric/Behavioral: Negative.   All other systems reviewed and are negative.                                                                                                                                                        Objective:   Physical Exam  Constitutional: She is oriented to person, place, and time. She appears well-developed and well-nourished.  HENT:  Nose: Nose normal.  Mouth/Throat: Oropharynx is clear and moist.  Eyes: EOM are normal.  Neck: Trachea normal, normal range of motion and full passive range of motion without pain. Neck supple. No JVD present. Carotid bruit is not present. No thyromegaly present.  Cardiovascular: Normal rate, regular rhythm, normal heart sounds and intact distal pulses.  Exam reveals no gallop and no friction rub.   No murmur heard. Pulmonary/Chest: Effort normal.  O2 via nasal cannnulla Diminished breath sounds throughout lung fields  Abdominal: Soft. Bowel sounds are normal. She exhibits no distension and no mass. There is no tenderness.  Musculoskeletal: Normal range of motion.  Walking with a walker  Lymphadenopathy:    She has no cervical adenopathy.  Neurological: She is alert and oriented to person, place, and time. She has normal reflexes.    Skin: Skin is warm and dry.  Fluid filled nodule left forearm  Psychiatric: She has a normal mood and affect. Her behavior is normal. Judgment and thought content normal.   BP (!) 142/88 (BP Location: Left Arm, Cuff Size: Normal)   Pulse 65   Temp 98.2 F (36.8 C) (Oral)   Ht 5\' 1"  (1.549 m)   Wt 112 lb (50.8 kg)   BMI 21.16 kg/m       Assessment & Plan:  1. Essential hypertension Low sodium diet - lisinopril (PRINIVIL,ZESTRIL) 40 MG tablet; Take 1 tablet (40 mg total) by mouth daily.  Dispense: 90 tablet; Refill: 1 - furosemide (LASIX) 20 MG tablet; TAKE (1) TABLET DAILY FOR (EXCESSIVE) FLUID.  Dispense: 90 tablet; Refill: 1  2. COPD with chronic bronchitis ([place]) - Ipratropium-Albuterol (COMBIVENT RESPIMAT) 20-100 MCG/ACT AERS respimat; Inhale 1 puff into the lungs every 6 (six) hours as needed for wheezing.  Dispense: 8 g; Refill: 5  3. Pulmonary emphysema, unspecified emphysema type ([place])  4. Age-related osteoporosis with current pathological fracture, sequela Weight bearing exercise  5. GAD (generalized anxiety disorder) Stress management - citalopram (CELEXA) 40 MG tablet; Take 1 tablet (40 mg total) by mouth daily.  Dispense: 90 tablet; Refill: 1  6. Hypokalemia - potassium chloride SA (K-DUR,KLOR-CON) 20 MEQ tablet; Take 1 tablet (20 mEq total) by mouth daily.  Dispense: 90 tablet; Refill: 1    Labs pending Health maintenance reviewed Diet and exercise encouraged Continue all meds Follow up  In 6 months  [place], FNP

## 2017-02-07 NOTE — Patient Instructions (Signed)
Fall Prevention in Hospitals, Adult WHAT ARE SOME SAFETY TIPS FOR PREVENTING FALLS? If you or a loved one has to stay in the hospital, talk with the health care providers about the risk of falling. Find out which medicines or treatments can cause dizziness or affect balance. Make a plan with the health care providers to prevent falls. The plan may include these points:  Ask for help moving around, especially after surgery or when feeling unwell.  Have support available when getting up and moving around.  Wear nonskid footwear.  Use the safety straps on wheelchairs.  Ask for help to get objects that are out of reach.  Wear eyeglasses.  Remove all clutter from the floor and the sides of the bed.  Keep equipment and wires securely out of the way.  Keep the bed locked in the low position.  Keep the side rails up on the bed.  Keep the nurse call button within reach.  Keep the door open when no one else is in the room.  Have someone stay in the hospital with you or your loved one.  Ask for a bed alarm if you are not able to stay with your loved one who is at risk for getting up without help.  Ask if sleeping pills or other medicines that alter mental status are necessary. WHAT INCREASES THE RISK FOR FALLS? Certain conditions and treatments may increase a patient's risk for falls in a hospital. These include:  Being in an unfamiliar environment.  Being on bed rest.  Having surgery.  Taking certain medicines, such as sleeping pills.  Having tubes in place, such as IV lines or catheters. Additional risk factors for falls in a hospital include:  Having vision problems.  Having a change in thinking, feeling, or behavior (altered mental status).  Having trouble with balance.  Needing to use the toilet frequently.  Having fallen in the past three months.  Having low blood pressure when standing up quickly (orthostatic hypotension). WHAT DOES THE HOSPITAL STAFF DO TO HELP  PREVENT ME OR MY LOVED ONE FROM FALLING? Hospitals have systems in place to prevent falls and accidents. Talk with the hospital staff about:  Doing an assessment to discuss fall risks and create a personalized plan to prevent falls.  Checking in regularly to see if help is needed for moving around and to assess any changes in fall risk.  Knowing where the nurse call button is and how to use it. Use this to call a nursing care provider any time.  Keeping personal items within reach. This includes eyeglasses, phones, and other electronic devices.  Following general safety guidelines when moving around.  Keeping the area around the bed free from clutter.  Removing unnecessary equipment or tubes to reduce the risk of tripping.  Using safety equipment, such as:  Walkers, crutches, and other walking devices for support.  Safety rails on the bed.  Safety straps in the bed.  A bed that can be lowered and locked to prevent movement.  Handrails in the bathroom.  Nonskid socks and shoes.  Locking mechanisms to secure equipment in place.  Lifting and transfer equipment. WHAT CAN I DO TO HELP PREVENT A FALL?  Talk with health care providers about fall prevention.  Have a personalized fall prevention plan in place.  Do not try to move around if you feel off balance or ill.  Change position slowly.  Sit on the side of the bed before standing up.  Sit down and call   for help if you feel dizzy or unsteady when standing.  Keep the hospital room clear of clutter. WHEN SHOULD I ASK FOR HELP? Ask for help whenever you:  Are not sure if you are able to move around safely.  Feel dizzy or unsteady.  Are not comfortable helping your loved one move around or use the bathroom. If you or a loved one falls, tell the hospital staff. This is important. This information is not intended to replace advice given to you by your health care provider. Make sure you discuss any questions you have  with your health care provider. Document Released: 11/24/2000 Document Revised: 04/25/2016 Document Reviewed: 09/09/2015 Elsevier Interactive Patient Education  2017 [employer]  

## 2017-02-08 ENCOUNTER — Telehealth: Payer: Self-pay | Admitting: Nurse Practitioner

## 2017-02-08 LAB — CMP14+EGFR
A/G RATIO: 1.9 (ref 1.2–2.2)
ALK PHOS: 77 IU/L (ref 39–117)
ALT: 5 IU/L (ref 0–32)
AST: 20 IU/L (ref 0–40)
Albumin: 4 g/dL (ref 3.5–4.7)
BUN/Creatinine Ratio: 10 — ABNORMAL LOW (ref 12–28)
BUN: 4 mg/dL — ABNORMAL LOW (ref 8–27)
Bilirubin Total: 0.4 mg/dL (ref 0.0–1.2)
CO2: 30 mmol/L — AB (ref 18–29)
CREATININE: 0.41 mg/dL — AB (ref 0.57–1.00)
Calcium: 9.7 mg/dL (ref 8.7–10.3)
Chloride: 89 mmol/L — ABNORMAL LOW (ref 96–106)
GFR calc Af Amer: 113 mL/min/{1.73_m2} (ref >59)
GFR calc non Af Amer: 98 mL/min/{1.73_m2} (ref >59)
GLOBULIN, TOTAL: 2.1 g/dL (ref 1.5–4.5)
Glucose: 100 mg/dL — ABNORMAL HIGH (ref 65–99)
POTASSIUM: 4.5 mmol/L (ref 3.5–5.2)
SODIUM: 132 mmol/L — AB (ref 134–144)
Total Protein: 6.1 g/dL (ref 6.0–8.5)

## 2017-02-08 LAB — LIPID PANEL
CHOLESTEROL TOTAL: 220 mg/dL — AB (ref 100–199)
Chol/HDL Ratio: 2.5 ratio units (ref 0.0–4.4)
HDL: 88 mg/dL (ref >39)
LDL Calculated: 122 mg/dL — ABNORMAL HIGH (ref 0–99)
TRIGLYCERIDES: 51 mg/dL (ref 0–149)
VLDL Cholesterol Cal: 10 mg/dL (ref 5–40)

## 2017-02-08 NOTE — Telephone Encounter (Signed)
Aware of lab results  

## 2017-03-09 ENCOUNTER — Other Ambulatory Visit: Payer: Self-pay | Admitting: Nurse Practitioner

## 2017-04-02 ENCOUNTER — Other Ambulatory Visit: Payer: Self-pay | Admitting: Nurse Practitioner

## 2017-04-24 ENCOUNTER — Other Ambulatory Visit: Payer: Self-pay | Admitting: Pediatrics

## 2017-05-14 ENCOUNTER — Other Ambulatory Visit: Payer: Self-pay | Admitting: Nurse Practitioner

## 2017-06-05 ENCOUNTER — Other Ambulatory Visit: Payer: Self-pay | Admitting: Nurse Practitioner

## 2017-06-06 NOTE — Telephone Encounter (Signed)
Last seen 02/07/17  MMM

## 2017-06-19 ENCOUNTER — Encounter (HOSPITAL_COMMUNITY): Payer: Self-pay | Admitting: Emergency Medicine

## 2017-06-19 ENCOUNTER — Inpatient Hospital Stay (HOSPITAL_COMMUNITY)
Admission: EM | Admit: 2017-06-19 | Discharge: 2017-06-23 | DRG: 480 | Disposition: A | Discharge status: Skilled Nursing Facility | Payer: Medicare Other | Attending: Family Medicine | Admitting: Family Medicine

## 2017-06-19 ENCOUNTER — Emergency Department (HOSPITAL_COMMUNITY): Payer: Medicare Other

## 2017-06-19 DIAGNOSIS — E871 Hypo-osmolality and hyponatremia: Secondary | ICD-10-CM | POA: Diagnosis present

## 2017-06-19 DIAGNOSIS — S72009A Fracture of unspecified part of neck of unspecified femur, initial encounter for closed fracture: Secondary | ICD-10-CM

## 2017-06-19 DIAGNOSIS — J449 Chronic obstructive pulmonary disease, unspecified: Secondary | ICD-10-CM | POA: Diagnosis present

## 2017-06-19 DIAGNOSIS — S7291XA Unspecified fracture of right femur, initial encounter for closed fracture: Secondary | ICD-10-CM | POA: Diagnosis not present

## 2017-06-19 DIAGNOSIS — Z9981 Dependence on supplemental oxygen: Secondary | ICD-10-CM

## 2017-06-19 DIAGNOSIS — Z419 Encounter for procedure for purposes other than remedying health state, unspecified: Secondary | ICD-10-CM

## 2017-06-19 DIAGNOSIS — I5042 Chronic combined systolic (congestive) and diastolic (congestive) heart failure: Secondary | ICD-10-CM | POA: Diagnosis present

## 2017-06-19 DIAGNOSIS — S72001A Fracture of unspecified part of neck of right femur, initial encounter for closed fracture: Secondary | ICD-10-CM

## 2017-06-19 DIAGNOSIS — I110 Hypertensive heart disease with heart failure: Secondary | ICD-10-CM | POA: Diagnosis present

## 2017-06-19 DIAGNOSIS — M810 Age-related osteoporosis without current pathological fracture: Secondary | ICD-10-CM | POA: Diagnosis present

## 2017-06-19 DIAGNOSIS — Z888 Allergy status to other drugs, medicaments and biological substances status: Secondary | ICD-10-CM

## 2017-06-19 DIAGNOSIS — D62 Acute posthemorrhagic anemia: Secondary | ICD-10-CM | POA: Diagnosis not present

## 2017-06-19 DIAGNOSIS — Z09 Encounter for follow-up examination after completed treatment for conditions other than malignant neoplasm: Secondary | ICD-10-CM

## 2017-06-19 DIAGNOSIS — I517 Cardiomegaly: Secondary | ICD-10-CM | POA: Diagnosis not present

## 2017-06-19 DIAGNOSIS — F329 Major depressive disorder, single episode, unspecified: Secondary | ICD-10-CM | POA: Diagnosis present

## 2017-06-19 DIAGNOSIS — Z681 Body mass index (BMI) 19 or less, adult: Secondary | ICD-10-CM | POA: Diagnosis not present

## 2017-06-19 DIAGNOSIS — I2510 Atherosclerotic heart disease of native coronary artery without angina pectoris: Secondary | ICD-10-CM | POA: Diagnosis present

## 2017-06-19 DIAGNOSIS — Y92002 Bathroom of unspecified non-institutional (private) residence single-family (private) house as the place of occurrence of the external cause: Secondary | ICD-10-CM

## 2017-06-19 DIAGNOSIS — J9611 Chronic respiratory failure with hypoxia: Secondary | ICD-10-CM | POA: Diagnosis present

## 2017-06-19 DIAGNOSIS — E43 Unspecified severe protein-calorie malnutrition: Secondary | ICD-10-CM | POA: Diagnosis not present

## 2017-06-19 DIAGNOSIS — Z8249 Family history of ischemic heart disease and other diseases of the circulatory system: Secondary | ICD-10-CM

## 2017-06-19 DIAGNOSIS — Z885 Allergy status to narcotic agent status: Secondary | ICD-10-CM

## 2017-06-19 DIAGNOSIS — W010XXA Fall on same level from slipping, tripping and stumbling without subsequent striking against object, initial encounter: Secondary | ICD-10-CM | POA: Diagnosis present

## 2017-06-19 DIAGNOSIS — M25551 Pain in right hip: Secondary | ICD-10-CM | POA: Diagnosis not present

## 2017-06-19 DIAGNOSIS — Z9851 Tubal ligation status: Secondary | ICD-10-CM

## 2017-06-19 DIAGNOSIS — S72141A Displaced intertrochanteric fracture of right femur, initial encounter for closed fracture: Principal | ICD-10-CM | POA: Diagnosis present

## 2017-06-19 DIAGNOSIS — I252 Old myocardial infarction: Secondary | ICD-10-CM

## 2017-06-19 DIAGNOSIS — Z79899 Other long term (current) drug therapy: Secondary | ICD-10-CM

## 2017-06-19 DIAGNOSIS — I10 Essential (primary) hypertension: Secondary | ICD-10-CM | POA: Diagnosis present

## 2017-06-19 DIAGNOSIS — Z87891 Personal history of nicotine dependence: Secondary | ICD-10-CM

## 2017-06-19 DIAGNOSIS — Z7982 Long term (current) use of aspirin: Secondary | ICD-10-CM

## 2017-06-19 HISTORY — DX: Fracture of unspecified part of neck of unspecified femur, initial encounter for closed fracture: S72.009A

## 2017-06-19 HISTORY — DX: Heart failure, unspecified: I50.9

## 2017-06-19 NOTE — ED Triage Notes (Signed)
Pt reports she was going to sit on the commode this evening and landed on buttock and R hip between commode. Now c/o pain in R hip, minimal shortening noted. Per EMS pt denied pain with palpation.

## 2017-06-20 ENCOUNTER — Inpatient Hospital Stay (HOSPITAL_COMMUNITY): Payer: Medicare Other

## 2017-06-20 ENCOUNTER — Inpatient Hospital Stay (HOSPITAL_COMMUNITY): Payer: Medicare Other | Admitting: Anesthesiology

## 2017-06-20 ENCOUNTER — Encounter (HOSPITAL_COMMUNITY): Payer: Self-pay | Admitting: General Practice

## 2017-06-20 ENCOUNTER — Encounter (HOSPITAL_COMMUNITY)
Admission: EM | Disposition: A | Discharge status: Skilled Nursing Facility | Payer: Self-pay | Source: Home / Self Care | Attending: Family Medicine

## 2017-06-20 DIAGNOSIS — J449 Chronic obstructive pulmonary disease, unspecified: Secondary | ICD-10-CM

## 2017-06-20 DIAGNOSIS — R4182 Altered mental status, unspecified: Secondary | ICD-10-CM | POA: Diagnosis not present

## 2017-06-20 DIAGNOSIS — E876 Hypokalemia: Secondary | ICD-10-CM | POA: Diagnosis not present

## 2017-06-20 DIAGNOSIS — I5032 Chronic diastolic (congestive) heart failure: Secondary | ICD-10-CM

## 2017-06-20 DIAGNOSIS — I10 Essential (primary) hypertension: Secondary | ICD-10-CM | POA: Diagnosis not present

## 2017-06-20 DIAGNOSIS — W19XXXA Unspecified fall, initial encounter: Secondary | ICD-10-CM | POA: Diagnosis not present

## 2017-06-20 DIAGNOSIS — S22009D Unspecified fracture of unspecified thoracic vertebra, subsequent encounter for fracture with routine healing: Secondary | ICD-10-CM

## 2017-06-20 DIAGNOSIS — I252 Old myocardial infarction: Secondary | ICD-10-CM | POA: Diagnosis not present

## 2017-06-20 DIAGNOSIS — E871 Hypo-osmolality and hyponatremia: Secondary | ICD-10-CM | POA: Diagnosis present

## 2017-06-20 DIAGNOSIS — M810 Age-related osteoporosis without current pathological fracture: Secondary | ICD-10-CM | POA: Diagnosis present

## 2017-06-20 DIAGNOSIS — S72141A Displaced intertrochanteric fracture of right femur, initial encounter for closed fracture: Secondary | ICD-10-CM | POA: Diagnosis present

## 2017-06-20 DIAGNOSIS — Z681 Body mass index (BMI) 19 or less, adult: Secondary | ICD-10-CM | POA: Diagnosis not present

## 2017-06-20 DIAGNOSIS — S92153A Displaced avulsion fracture (chip fracture) of unspecified talus, initial encounter for closed fracture: Secondary | ICD-10-CM | POA: Diagnosis not present

## 2017-06-20 DIAGNOSIS — R1311 Dysphagia, oral phase: Secondary | ICD-10-CM | POA: Diagnosis not present

## 2017-06-20 DIAGNOSIS — E43 Unspecified severe protein-calorie malnutrition: Secondary | ICD-10-CM | POA: Diagnosis not present

## 2017-06-20 DIAGNOSIS — Z87891 Personal history of nicotine dependence: Secondary | ICD-10-CM | POA: Diagnosis not present

## 2017-06-20 DIAGNOSIS — Y92002 Bathroom of unspecified non-institutional (private) residence single-family (private) house as the place of occurrence of the external cause: Secondary | ICD-10-CM | POA: Diagnosis not present

## 2017-06-20 DIAGNOSIS — S72001A Fracture of unspecified part of neck of right femur, initial encounter for closed fracture: Secondary | ICD-10-CM | POA: Diagnosis not present

## 2017-06-20 DIAGNOSIS — W010XXA Fall on same level from slipping, tripping and stumbling without subsequent striking against object, initial encounter: Secondary | ICD-10-CM | POA: Diagnosis present

## 2017-06-20 DIAGNOSIS — Z885 Allergy status to narcotic agent status: Secondary | ICD-10-CM | POA: Diagnosis not present

## 2017-06-20 DIAGNOSIS — F329 Major depressive disorder, single episode, unspecified: Secondary | ICD-10-CM | POA: Diagnosis present

## 2017-06-20 DIAGNOSIS — I5042 Chronic combined systolic (congestive) and diastolic (congestive) heart failure: Secondary | ICD-10-CM | POA: Diagnosis not present

## 2017-06-20 DIAGNOSIS — Z9851 Tubal ligation status: Secondary | ICD-10-CM | POA: Diagnosis not present

## 2017-06-20 DIAGNOSIS — Z7982 Long term (current) use of aspirin: Secondary | ICD-10-CM | POA: Diagnosis not present

## 2017-06-20 DIAGNOSIS — Z8249 Family history of ischemic heart disease and other diseases of the circulatory system: Secondary | ICD-10-CM | POA: Diagnosis not present

## 2017-06-20 DIAGNOSIS — S72001D Fracture of unspecified part of neck of right femur, subsequent encounter for closed fracture with routine healing: Secondary | ICD-10-CM | POA: Diagnosis not present

## 2017-06-20 DIAGNOSIS — Z79899 Other long term (current) drug therapy: Secondary | ICD-10-CM | POA: Diagnosis not present

## 2017-06-20 DIAGNOSIS — M6281 Muscle weakness (generalized): Secondary | ICD-10-CM | POA: Diagnosis not present

## 2017-06-20 DIAGNOSIS — Z888 Allergy status to other drugs, medicaments and biological substances status: Secondary | ICD-10-CM | POA: Diagnosis not present

## 2017-06-20 DIAGNOSIS — I517 Cardiomegaly: Secondary | ICD-10-CM | POA: Diagnosis not present

## 2017-06-20 DIAGNOSIS — M8000XS Age-related osteoporosis with current pathological fracture, unspecified site, sequela: Secondary | ICD-10-CM | POA: Diagnosis not present

## 2017-06-20 DIAGNOSIS — I2510 Atherosclerotic heart disease of native coronary artery without angina pectoris: Secondary | ICD-10-CM | POA: Diagnosis present

## 2017-06-20 DIAGNOSIS — Z9981 Dependence on supplemental oxygen: Secondary | ICD-10-CM | POA: Diagnosis not present

## 2017-06-20 DIAGNOSIS — I110 Hypertensive heart disease with heart failure: Secondary | ICD-10-CM | POA: Diagnosis present

## 2017-06-20 DIAGNOSIS — D62 Acute posthemorrhagic anemia: Secondary | ICD-10-CM | POA: Diagnosis not present

## 2017-06-20 DIAGNOSIS — S72141D Displaced intertrochanteric fracture of right femur, subsequent encounter for closed fracture with routine healing: Secondary | ICD-10-CM | POA: Diagnosis not present

## 2017-06-20 DIAGNOSIS — S22000D Wedge compression fracture of unspecified thoracic vertebra, subsequent encounter for fracture with routine healing: Secondary | ICD-10-CM | POA: Diagnosis not present

## 2017-06-20 DIAGNOSIS — J9611 Chronic respiratory failure with hypoxia: Secondary | ICD-10-CM | POA: Diagnosis present

## 2017-06-20 DIAGNOSIS — R41841 Cognitive communication deficit: Secondary | ICD-10-CM | POA: Diagnosis not present

## 2017-06-20 DIAGNOSIS — F419 Anxiety disorder, unspecified: Secondary | ICD-10-CM | POA: Diagnosis not present

## 2017-06-20 HISTORY — PX: FEMUR IM NAIL: SHX1597

## 2017-06-20 LAB — BASIC METABOLIC PANEL
ANION GAP: 13 (ref 5–15)
Anion gap: 6 (ref 5–15)
BUN: 13 mg/dL (ref 6–20)
BUN: 12 mg/dL (ref 6–20)
CALCIUM: 9.6 mg/dL (ref 8.9–10.3)
CHLORIDE: 92 mmol/L — AB (ref 101–111)
CHLORIDE: 91 mmol/L — AB (ref 101–111)
CO2: 31 mmol/L (ref 22–32)
CO2: 36 mmol/L — AB (ref 22–32)
CREATININE: 0.41 mg/dL — AB (ref 0.44–1.00)
Calcium: 9.8 mg/dL (ref 8.9–10.3)
Creatinine, Ser: 0.36 mg/dL — ABNORMAL LOW (ref 0.44–1.00)
GFR calc Af Amer: >60 mL/min (ref >60)
GFR calc non Af Amer: >60 mL/min (ref >60)
GFR calc non Af Amer: >60 mL/min (ref >60)
GLUCOSE: 136 mg/dL — AB (ref 65–99)
Glucose, Bld: 141 mg/dL — ABNORMAL HIGH (ref 65–99)
POTASSIUM: 4.1 mmol/L (ref 3.5–5.1)
Potassium: 3.8 mmol/L (ref 3.5–5.1)
SODIUM: 135 mmol/L (ref 135–145)
Sodium: 134 mmol/L — ABNORMAL LOW (ref 135–145)

## 2017-06-20 LAB — ECHOCARDIOGRAM COMPLETE
AOASC: 26 cm
AVLVOTPG: 7 mmHg
CHL CUP DOP CALC LVOT VTI: 25.6 cm
CHL CUP STROKE VOLUME: 40 mL
E decel time: 158 msec
EERAT: 11.86
FS: 34 % (ref 28–44)
Height: 64 in
IVS/LV PW RATIO, ED: 1.08
LA ID, A-P, ES: 29 mm
LA vol A4C: 28.6 ml
LADIAMINDEX: 1.99 cm/m2
LAVOL: 36.9 mL
LAVOLIN: 25.3 mL/m2
LDCA: 2.54 cm2
LEFT ATRIUM END SYS DIAM: 29 mm
LV E/e' medial: 11.86
LV PW d: 11.4 mm — AB (ref 0.6–1.1)
LV SIMPSON'S DISK: 58
LVDIAVOL: 68 mL (ref 46–106)
LVDIAVOLIN: 47 mL/m2
LVEEAVG: 11.86
LVELAT: 6.25 cm/s
LVOT SV: 65 mL
LVOTD: 18 mm
LVOTPV: 132 cm/s
LVSYSVOL: 29 mL (ref 14–42)
LVSYSVOLIN: 20 mL/m2
MV Dec: 158
MV pk A vel: 137 m/s
MV pk E vel: 74.1 m/s
MVPG: 2 mmHg
RV LATERAL S' VELOCITY: 10.4 cm/s
RV TAPSE: 18.1 mm
S' Lateral: 7.31 cm/s
TDI e' lateral: 6.25
TDI e' medial: 3.56
Weight: 1600 oz

## 2017-06-20 LAB — TYPE AND SCREEN
ABO/RH(D): A POS
Antibody Screen: NEGATIVE

## 2017-06-20 LAB — CBC WITH DIFFERENTIAL/PLATELET
BASOS PCT: 0 %
Basophils Absolute: 0 10*3/uL (ref 0.0–0.1)
EOS ABS: 0 10*3/uL (ref 0.0–0.7)
Eosinophils Relative: 0 %
HEMATOCRIT: 37.3 % (ref 36.0–46.0)
HEMOGLOBIN: 12 g/dL (ref 12.0–15.0)
Lymphocytes Relative: 8 %
Lymphs Abs: 0.7 10*3/uL (ref 0.7–4.0)
MCH: 29.1 pg (ref 26.0–34.0)
MCHC: 32.2 g/dL (ref 30.0–36.0)
MCV: 90.5 fL (ref 78.0–100.0)
Monocytes Absolute: 0.9 10*3/uL (ref 0.1–1.0)
Monocytes Relative: 9 %
NEUTROS ABS: 8 10*3/uL — AB (ref 1.7–7.7)
NEUTROS PCT: 83 %
Platelets: 232 10*3/uL (ref 150–400)
RBC: 4.12 MIL/uL (ref 3.87–5.11)
RDW: 12.2 % (ref 11.5–15.5)
WBC: 9.6 10*3/uL (ref 4.0–10.5)

## 2017-06-20 LAB — CBC
HEMATOCRIT: 37.6 % (ref 36.0–46.0)
HEMOGLOBIN: 11.8 g/dL — AB (ref 12.0–15.0)
MCH: 28.9 pg (ref 26.0–34.0)
MCHC: 31.4 g/dL (ref 30.0–36.0)
MCV: 91.9 fL (ref 78.0–100.0)
Platelets: 210 10*3/uL (ref 150–400)
RBC: 4.09 MIL/uL (ref 3.87–5.11)
RDW: 12.5 % (ref 11.5–15.5)
WBC: 10.4 10*3/uL (ref 4.0–10.5)

## 2017-06-20 LAB — ABO/RH: ABO/RH(D): A POS

## 2017-06-20 LAB — SURGICAL PCR SCREEN
MRSA, PCR: NEGATIVE
STAPHYLOCOCCUS AUREUS: POSITIVE — AB

## 2017-06-20 SURGERY — INSERTION, INTRAMEDULLARY ROD, FEMUR
Anesthesia: General | Site: Leg Upper | Laterality: Right

## 2017-06-20 MED ORDER — ALBUTEROL SULFATE HFA 108 (90 BASE) MCG/ACT IN AERS
INHALATION_SPRAY | RESPIRATORY_TRACT | Status: AC
  Filled 2017-06-20: qty 6.7

## 2017-06-20 MED ORDER — LISINOPRIL 40 MG PO TABS
40.0000 mg | ORAL_TABLET | Freq: Every day | ORAL | Status: DC
  Administered 2017-06-21 – 2017-06-23 (×3): 40 mg via ORAL
  Filled 2017-06-20 (×3): qty 1

## 2017-06-20 MED ORDER — HYDROCODONE-ACETAMINOPHEN 5-325 MG PO TABS
1.0000 | ORAL_TABLET | Freq: Four times a day (QID) | ORAL | Status: DC | PRN
  Administered 2017-06-20 – 2017-06-22 (×4): 1 via ORAL
  Filled 2017-06-20 (×4): qty 1

## 2017-06-20 MED ORDER — ENSURE ENLIVE PO LIQD
237.0000 mL | Freq: Two times a day (BID) | ORAL | Status: DC
  Administered 2017-06-21 – 2017-06-23 (×6): 237 mL via ORAL

## 2017-06-20 MED ORDER — FENTANYL CITRATE (PF) 100 MCG/2ML IJ SOLN: 25.0000 ug | INTRAMUSCULAR | Status: DC | PRN

## 2017-06-20 MED ORDER — SUCCINYLCHOLINE CHLORIDE 200 MG/10ML IV SOSY
PREFILLED_SYRINGE | INTRAVENOUS | Status: AC
  Filled 2017-06-20: qty 10

## 2017-06-20 MED ORDER — CALCIUM CARBONATE-VITAMIN D 500-200 MG-UNIT PO TABS
1.0000 | ORAL_TABLET | Freq: Every day | ORAL | Status: DC
  Administered 2017-06-21 – 2017-06-23 (×3): 1 via ORAL
  Filled 2017-06-20 (×3): qty 1

## 2017-06-20 MED ORDER — CEFAZOLIN SODIUM 1 G IJ SOLR
INTRAMUSCULAR | Status: DC | PRN
  Administered 2017-06-20: 2 g via INTRAMUSCULAR

## 2017-06-20 MED ORDER — SUGAMMADEX SODIUM 200 MG/2ML IV SOLN
INTRAVENOUS | Status: AC
  Filled 2017-06-20: qty 2

## 2017-06-20 MED ORDER — CHLORHEXIDINE GLUCONATE 4 % EX LIQD
60.0000 mL | Freq: Once | CUTANEOUS | Status: AC
  Administered 2017-06-20: 4 via TOPICAL
  Filled 2017-06-20: qty 60

## 2017-06-20 MED ORDER — ADULT MULTIVITAMIN W/MINERALS CH
1.0000 | ORAL_TABLET | Freq: Every day | ORAL | Status: DC
  Administered 2017-06-21 – 2017-06-23 (×3): 1 via ORAL
  Filled 2017-06-20 (×3): qty 1

## 2017-06-20 MED ORDER — FENTANYL CITRATE (PF) 100 MCG/2ML IJ SOLN
50.0000 ug | Freq: Once | INTRAMUSCULAR | Status: AC
  Administered 2017-06-20: 50 ug via INTRAVENOUS
  Filled 2017-06-20: qty 2

## 2017-06-20 MED ORDER — MORPHINE SULFATE (PF) 2 MG/ML IV SOLN: 0.5000 mg | INTRAVENOUS | Status: DC | PRN

## 2017-06-20 MED ORDER — DEXAMETHASONE SODIUM PHOSPHATE 10 MG/ML IJ SOLN
INTRAMUSCULAR | Status: DC | PRN
  Administered 2017-06-20: 10 mg via INTRAVENOUS

## 2017-06-20 MED ORDER — CITALOPRAM HYDROBROMIDE 40 MG PO TABS
40.0000 mg | ORAL_TABLET | Freq: Every day | ORAL | Status: DC
  Administered 2017-06-21 – 2017-06-23 (×3): 40 mg via ORAL
  Filled 2017-06-20 (×3): qty 1

## 2017-06-20 MED ORDER — DEXAMETHASONE SODIUM PHOSPHATE 10 MG/ML IJ SOLN
INTRAMUSCULAR | Status: AC
  Filled 2017-06-20: qty 1

## 2017-06-20 MED ORDER — IPRATROPIUM-ALBUTEROL 0.5-2.5 (3) MG/3ML IN SOLN
3.0000 mL | Freq: Four times a day (QID) | RESPIRATORY_TRACT | Status: DC | PRN
  Administered 2017-06-22 – 2017-06-23 (×3): 3 mL via RESPIRATORY_TRACT
  Filled 2017-06-20 (×3): qty 3

## 2017-06-20 MED ORDER — PHENYLEPHRINE HCL 10 MG/ML IJ SOLN
INTRAMUSCULAR | Status: DC | PRN
  Administered 2017-06-20: 80 ug via INTRAVENOUS
  Administered 2017-06-20: 40 ug via INTRAVENOUS

## 2017-06-20 MED ORDER — PROPOFOL 10 MG/ML IV BOLUS
INTRAVENOUS | Status: DC | PRN
  Administered 2017-06-20: 80 mg via INTRAVENOUS

## 2017-06-20 MED ORDER — SODIUM CHLORIDE 0.45 % IV SOLN
INTRAVENOUS | Status: DC
  Administered 2017-06-20 – 2017-06-23 (×7): via INTRAVENOUS

## 2017-06-20 MED ORDER — PROPOFOL 10 MG/ML IV BOLUS
INTRAVENOUS | Status: AC
  Filled 2017-06-20: qty 20

## 2017-06-20 MED ORDER — SUGAMMADEX SODIUM 200 MG/2ML IV SOLN
INTRAVENOUS | Status: DC | PRN
  Administered 2017-06-20: 200 mg via INTRAVENOUS

## 2017-06-20 MED ORDER — ALBUTEROL SULFATE (2.5 MG/3ML) 0.083% IN NEBU
2.5000 mg | INHALATION_SOLUTION | Freq: Four times a day (QID) | RESPIRATORY_TRACT | Status: DC | PRN
  Administered 2017-06-20 – 2017-06-22 (×2): 2.5 mg via RESPIRATORY_TRACT
  Filled 2017-06-20: qty 3

## 2017-06-20 MED ORDER — PROMETHAZINE HCL 25 MG/ML IJ SOLN: 6.2500 mg | INTRAMUSCULAR | Status: DC | PRN

## 2017-06-20 MED ORDER — METHOCARBAMOL 1000 MG/10ML IJ SOLN
500.0000 mg | Freq: Four times a day (QID) | INTRAVENOUS | Status: DC | PRN
  Filled 2017-06-20: qty 5

## 2017-06-20 MED ORDER — ALBUTEROL SULFATE HFA 108 (90 BASE) MCG/ACT IN AERS
2.0000 | INHALATION_SPRAY | Freq: Four times a day (QID) | RESPIRATORY_TRACT | Status: DC | PRN
  Administered 2017-06-20: 2 via RESPIRATORY_TRACT

## 2017-06-20 MED ORDER — LACTATED RINGERS IV SOLN
INTRAVENOUS | Status: DC | PRN
  Administered 2017-06-20 (×2): via INTRAVENOUS

## 2017-06-20 MED ORDER — CHLORHEXIDINE GLUCONATE CLOTH 2 % EX PADS
6.0000 | MEDICATED_PAD | Freq: Every day | CUTANEOUS | Status: DC
  Administered 2017-06-20 – 2017-06-23 (×4): 6 via TOPICAL

## 2017-06-20 MED ORDER — MUPIROCIN 2 % EX OINT
1.0000 "application " | TOPICAL_OINTMENT | Freq: Two times a day (BID) | CUTANEOUS | Status: DC
  Administered 2017-06-20 – 2017-06-23 (×6): 1 via NASAL
  Filled 2017-06-20 (×3): qty 22

## 2017-06-20 MED ORDER — BENZONATATE 100 MG PO CAPS: 100.0000 mg | ORAL_CAPSULE | Freq: Two times a day (BID) | ORAL | Status: DC | PRN

## 2017-06-20 MED ORDER — ALBUTEROL SULFATE (2.5 MG/3ML) 0.083% IN NEBU
INHALATION_SOLUTION | RESPIRATORY_TRACT | Status: AC
  Filled 2017-06-20: qty 3

## 2017-06-20 MED ORDER — ONDANSETRON HCL 4 MG/2ML IJ SOLN
INTRAMUSCULAR | Status: DC | PRN
  Administered 2017-06-20: 4 mg via INTRAVENOUS

## 2017-06-20 MED ORDER — METHOCARBAMOL 500 MG PO TABS: 500.0000 mg | ORAL_TABLET | Freq: Four times a day (QID) | ORAL | Status: DC | PRN

## 2017-06-20 MED ORDER — ALBUTEROL SULFATE (2.5 MG/3ML) 0.083% IN NEBU
INHALATION_SOLUTION | RESPIRATORY_TRACT | Status: AC
  Administered 2017-06-20: 2.5 mg via RESPIRATORY_TRACT
  Filled 2017-06-20: qty 3

## 2017-06-20 MED ORDER — CEFAZOLIN SODIUM 1 G IJ SOLR
INTRAMUSCULAR | Status: AC
  Filled 2017-06-20: qty 20

## 2017-06-20 MED ORDER — FENTANYL CITRATE (PF) 250 MCG/5ML IJ SOLN
INTRAMUSCULAR | Status: AC
  Filled 2017-06-20: qty 5

## 2017-06-20 MED ORDER — MEPERIDINE HCL 25 MG/ML IJ SOLN: 6.2500 mg | INTRAMUSCULAR | Status: DC | PRN

## 2017-06-20 MED ORDER — LACTATED RINGERS IV SOLN
INTRAVENOUS | Status: DC
  Administered 2017-06-20: 17:00:00 via INTRAVENOUS

## 2017-06-20 MED ORDER — POVIDONE-IODINE 10 % EX SWAB
2.0000 "application " | Freq: Once | CUTANEOUS | Status: AC
  Administered 2017-06-20: 2 via TOPICAL

## 2017-06-20 MED ORDER — CEFAZOLIN SODIUM-DEXTROSE 2-4 GM/100ML-% IV SOLN
2.0000 g | INTRAVENOUS | Status: DC
  Filled 2017-06-20: qty 100

## 2017-06-20 MED ORDER — POLYETHYLENE GLYCOL 3350 17 G PO PACK: 17.0000 g | PACK | Freq: Every day | ORAL | Status: DC | PRN

## 2017-06-20 MED ORDER — IPRATROPIUM-ALBUTEROL 20-100 MCG/ACT IN AERS: 1.0000 | INHALATION_SPRAY | Freq: Four times a day (QID) | RESPIRATORY_TRACT | Status: DC | PRN

## 2017-06-20 MED ORDER — FENTANYL CITRATE (PF) 100 MCG/2ML IJ SOLN
INTRAMUSCULAR | Status: DC | PRN
  Administered 2017-06-20: 100 ug via INTRAVENOUS

## 2017-06-20 MED ORDER — ROCURONIUM BROMIDE 50 MG/5ML IV SOLN
INTRAVENOUS | Status: AC
  Filled 2017-06-20: qty 1

## 2017-06-20 MED ORDER — SUCCINYLCHOLINE CHLORIDE 20 MG/ML IJ SOLN
INTRAMUSCULAR | Status: DC | PRN
  Administered 2017-06-20: 60 mg via INTRAVENOUS

## 2017-06-20 MED ORDER — SODIUM CHLORIDE 0.9 % IN NEBU
INHALATION_SOLUTION | RESPIRATORY_TRACT | Status: AC
  Filled 2017-06-20: qty 3

## 2017-06-20 MED ORDER — ROCURONIUM BROMIDE 100 MG/10ML IV SOLN
INTRAVENOUS | Status: DC | PRN
  Administered 2017-06-20: 30 mg via INTRAVENOUS

## 2017-06-20 MED ORDER — ONDANSETRON HCL 4 MG/2ML IJ SOLN
INTRAMUSCULAR | Status: AC
  Filled 2017-06-20: qty 2

## 2017-06-20 MED ORDER — 0.9 % SODIUM CHLORIDE (POUR BTL) OPTIME
TOPICAL | Status: DC | PRN
  Administered 2017-06-20: 1000 mL

## 2017-06-20 MED ORDER — PHENYLEPHRINE 40 MCG/ML (10ML) SYRINGE FOR IV PUSH (FOR BLOOD PRESSURE SUPPORT)
PREFILLED_SYRINGE | INTRAVENOUS | Status: AC
  Filled 2017-06-20: qty 10

## 2017-06-20 SURGICAL SUPPLY — 51 items
ADH SKN CLS APL DERMABOND .7 (GAUZE/BANDAGES/DRESSINGS) ×1
ALCOHOL ISOPROPYL (RUBBING) (MISCELLANEOUS) ×2 IMPLANT
BIT DRILL CANN LG 4.3MM (BIT) IMPLANT
BNDG COHESIVE 4X5 TAN STRL (GAUZE/BANDAGES/DRESSINGS) ×2 IMPLANT
CHLORAPREP W/TINT 26ML (MISCELLANEOUS) ×2 IMPLANT
COVER PERINEAL POST (MISCELLANEOUS) ×2 IMPLANT
COVER SURGICAL LIGHT HANDLE (MISCELLANEOUS) ×2 IMPLANT
DERMABOND ADVANCED (GAUZE/BANDAGES/DRESSINGS) ×1
DERMABOND ADVANCED .7 DNX12 (GAUZE/BANDAGES/DRESSINGS) ×2 IMPLANT
DRAPE C-ARM 42X72 X-RAY (DRAPES) ×2 IMPLANT
DRAPE C-ARMOR (DRAPES) ×2 IMPLANT
DRAPE IMP U-DRAPE 54X76 (DRAPES) ×4 IMPLANT
DRAPE STERI IOBAN 125X83 (DRAPES) ×2 IMPLANT
DRAPE U-SHAPE 47X51 STRL (DRAPES) ×4 IMPLANT
DRAPE UNIVERSAL PACK (DRAPES) ×2 IMPLANT
DRILL BIT CANN LG 4.3MM (BIT) ×2
DRSG MEPILEX BORDER 4X4 (GAUZE/BANDAGES/DRESSINGS) ×4 IMPLANT
DRSG PAD ABDOMINAL 8X10 ST (GAUZE/BANDAGES/DRESSINGS) ×2 IMPLANT
ELECT REM PT RETURN 9FT ADLT (ELECTROSURGICAL) ×2
ELECTRODE REM PT RTRN 9FT ADLT (ELECTROSURGICAL) ×1 IMPLANT
FACESHIELD WRAPAROUND (MASK) ×2 IMPLANT
FACESHIELD WRAPAROUND OR TEAM (MASK) ×1 IMPLANT
GLOVE BIO SURGEON STRL SZ 6.5 (GLOVE) ×3 IMPLANT
GLOVE BIO SURGEON STRL SZ8.5 (GLOVE) ×6 IMPLANT
GLOVE BIOGEL PI IND STRL 8.5 (GLOVE) ×1 IMPLANT
GLOVE BIOGEL PI INDICATOR 8.5 (GLOVE) ×1
GOWN STRL REUS W/ TWL LRG LVL3 (GOWN DISPOSABLE) ×1 IMPLANT
GOWN STRL REUS W/TWL 2XL LVL3 (GOWN DISPOSABLE) ×2 IMPLANT
GOWN STRL REUS W/TWL LRG LVL3 (GOWN DISPOSABLE) ×2
GUIDEPIN 3.2X17.5 THRD DISP (PIN) ×1 IMPLANT
GUIDEWIRE BALL NOSE 100CM (WIRE) ×1 IMPLANT
HIP FRA NAIL LAG SCREW 10.5X90 (Orthopedic Implant) ×2 IMPLANT
KIT ROOM TURNOVER OR (KITS) ×2 IMPLANT
MANIFOLD NEPTUNE II (INSTRUMENTS) ×2 IMPLANT
MARKER SKIN DUAL TIP RULER LAB (MISCELLANEOUS) ×2 IMPLANT
NAIL HIP FRACT 130D 11X180 (Screw) ×1 IMPLANT
NS IRRIG 1000ML POUR BTL (IV SOLUTION) ×2 IMPLANT
PACK GENERAL/GYN (CUSTOM PROCEDURE TRAY) ×2 IMPLANT
PAD ARMBOARD 7.5X6 YLW CONV (MISCELLANEOUS) ×4 IMPLANT
PADDING CAST ABS 4INX4YD NS (CAST SUPPLIES) ×1
PADDING CAST ABS COTTON 4X4 ST (CAST SUPPLIES) ×1 IMPLANT
SCREW BONE CORTICAL 5.0X32 (Screw) ×1 IMPLANT
SCREW LAG HIP FRA NAIL 10.5X90 (Orthopedic Implant) IMPLANT
SUT MNCRL AB 3-0 PS2 27 (SUTURE) ×2 IMPLANT
SUT MON AB 2-0 CT1 27 (SUTURE) ×2 IMPLANT
SUT VIC AB 1 CT1 27 (SUTURE) ×2
SUT VIC AB 1 CT1 27XBRD ANBCTR (SUTURE) ×1 IMPLANT
SUT VIC AB 2-0 CT1 27 (SUTURE) ×2
SUT VIC AB 2-0 CT1 TAPERPNT 27 (SUTURE) IMPLANT
TOWEL OR 17X24 6PK STRL BLUE (TOWEL DISPOSABLE) ×2 IMPLANT
TOWEL OR 17X26 10 PK STRL BLUE (TOWEL DISPOSABLE) ×2 IMPLANT

## 2017-06-20 NOTE — H&P (Signed)
[facility] History and Physical  [name] DVV:[id number] DOB: 01-01-1934 DOA: 06/19/2017  PCP: [name], FNP  Patient coming from: Home  Chief Complaint: Right hip pain  HPI: Tiffany Maxwell is a 83 y.o. female with a medical history of diastolic heart failure, COPD on home oxygen, multiple fractures of the spine, presented with right hip pain. Patient was trying to sit on the commode earlier this evening, instead landed on her right buttock and hip between the commode and sink. Currently denies any chest pain, shortness breath, abdominal pain, nausea or vomiting, diarrhea constipation, dizziness or headache, changes in bowel or urinary pattern, recent illness or travel. Patient uses a walker for ambulation.   ED Course: Found to have hip fracture on xray. [facility] called for admission.   Review of Systems:  All other systems reviewed and are negative.   Past Medical History:  Diagnosis Date  . Cholelithiasis 01/30/2013  . Compression fracture of lumbar spine, non-traumatic (Tiffany Maxwell) 04/09/2014  . COPD (chronic obstructive pulmonary disease) (Tiffany Maxwell)   . Diastolic dysfunction 06/05/9484   Grade 1. Ejection fraction 65%.  . History of tobacco use 01/30/2013  . Hyperlipidemia   . Hypertension   . MI (myocardial infarction) (Tiffany Maxwell)    pt unaware  . Multiple fractures of thoracic spine, closed (Tiffany Maxwell) 01/30/2013  . Osteoporosis   . PAC (premature atrial contraction)   . Palpitations   . Pulmonary emphysema (Tiffany Maxwell) 01/30/2013  . Seborrheic keratosis     Past Surgical History:  Procedure Laterality Date  . COMPRESSION HIP SCREW Left 04/29/2013   Procedure: COMPRESSION HIP;  Surgeon: Sanjuana Kava, MD;  Location: [facility];  Service: Orthopedics;  Laterality: Left;  [place]  . TUBAL LIGATION      Social History:  reports that she quit smoking about 4 years ago. Her smoking use included Cigarettes. She has a 40.00 pack-year smoking history. She has quit using smokeless  tobacco. She reports that she does not drink alcohol or use drugs.  Allergies  Allergen Reactions  . Codeine Other (See Comments)    headache  . Benicar Hct [Olmesartan Medoxomil-Hctz] Swelling and Rash  . Naproxen Itching, Rash and Other (See Comments)    Redness/flushing of skin    Family History  Problem Relation Age of Onset  . Hypertension Mother      Prior to Admission medications   Medication Sig Start Date End Date Taking? Authorizing Provider  aspirin EC 81 MG tablet Take 81 mg by mouth daily.    [provider]  benzonatate (TESSALON) 100 MG capsule TAKE 1 CAPSULE TWICE DAILY AS NEEDED FOR COUGH 06/07/17   Hassell Done, Mary-Margaret, FNP  Calcium Carbonate-Vitamin D (CALCIUM 600+D3) 600-400 MG-UNIT per tablet Take 1 tablet by mouth 2 (two) times daily. Patient taking differently: Take 1 tablet by mouth daily.  04/09/14   Rexene Alberts, MD  citalopram (CELEXA) 40 MG tablet Take 1 tablet (40 mg total) by mouth daily. 02/07/17   Hassell Done Mary-Margaret, FNP  furosemide (LASIX) 20 MG tablet TAKE (1) TABLET DAILY FOR (EXCESSIVE) FLUID. 02/07/17   Hassell Done, Mary-Margaret, FNP  Ipratropium-Albuterol (COMBIVENT RESPIMAT) 20-100 MCG/ACT AERS respimat Inhale 1 puff into the lungs every 6 (six) hours as needed for wheezing. 02/07/17   Hassell Done, Mary-Margaret, FNP  lisinopril (PRINIVIL,ZESTRIL) 40 MG tablet Take 1 tablet (40 mg total) by mouth daily. 02/07/17   Hassell Done Mary-Margaret, FNP  polyethylene glycol powder (GLYCOLAX/MIRALAX) powder 17 GRAMS (1 CAPFUL) ONCE A DAY AS DIRECTED 07/03/16   Hassell Done,  Mary-Margaret, FNP  potassium chloride SA (K-DUR,KLOR-CON) 20 MEQ tablet Take 1 tablet (20 mEq total) by mouth daily. 02/07/17   Hassell Done Mary-Margaret, FNP  VENTOLIN HFA 108 (90 Base) MCG/ACT inhaler 2 PUFFS EVERY 6 HOURS AS NEEDED FOR WHEEZING OR SHORTNESS OF BREATH 04/03/17   [name], FNP    Physical Exam: Vitals:   06/19/17 2233  BP: 137/75  Pulse: 76  Resp: 18  Temp: 98.3 F  (36.8 C)     General: Well developed, chronically ill appearing, thin, NAD, appears stated age  HEENT: NCAT, PERRLA, EOMI, Anicteic Sclera, mucous membranes dry, nasal canula in place  Neck: Supple, no JVD, no masses  Cardiovascular: S1 S2 auscultated, no rubs, murmurs or gallops. Regular rate and rhythm.  Respiratory: Diffuse expiratory wheezing, normal chest rise  Abdomen: Soft, nontender, nondistended, + bowel sounds  Extremities: warm dry without cyanosis clubbing or edema. RLE shortened and externally rotated, pain with palpation of the right hip  Neuro: AAOx3, cranial nerves grossly intact. Strength not tested in RLE due to pain  Skin: Without rashes exudates or nodules  Psych: Normal affect and demeanor with intact judgement and insight  Labs on Admission: I have personally reviewed following labs and imaging studies CBC: No results for input(s): WBC, NEUTROABS, HGB, HCT, MCV, PLT in the last 168 hours. Basic Metabolic Panel: No results for input(s): NA, K, CL, CO2, GLUCOSE, BUN, CREATININE, CALCIUM, MG, PHOS in the last 168 hours. GFR: CrCl cannot be calculated (Patient's most recent lab result is older than the maximum 21 days allowed.). Liver Function Tests: No results for input(s): AST, ALT, ALKPHOS, BILITOT, PROT, ALBUMIN in the last 168 hours. No results for input(s): LIPASE, AMYLASE in the last 168 hours. No results for input(s): AMMONIA in the last 168 hours. Coagulation Profile: No results for input(s): INR, PROTIME in the last 168 hours. Cardiac Enzymes: No results for input(s): CKTOTAL, CKMB, CKMBINDEX, TROPONINI in the last 168 hours. BNP (last 3 results) No results for input(s): PROBNP in the last 8760 hours. HbA1C: No results for input(s): HGBA1C in the last 72 hours. CBG: No results for input(s): GLUCAP in the last 168 hours. Lipid Profile: No results for input(s): CHOL, HDL, LDLCALC, TRIG, CHOLHDL, LDLDIRECT in the last 72 hours. Thyroid Function  Tests: No results for input(s): TSH, T4TOTAL, FREET4, T3FREE, THYROIDAB in the last 72 hours. Anemia Panel: No results for input(s): VITAMINB12, FOLATE, FERRITIN, TIBC, IRON, RETICCTPCT in the last 72 hours. Urine analysis:    Component Value Date/Time   COLORURINE YELLOW 04/07/2014 [place] 04/07/2014 2303   LABSPEC 1.010 04/07/2014 2303   PHURINE 6.0 04/07/2014 2303   GLUCOSEU NEGATIVE 04/07/2014 2303   HGBUR NEGATIVE 04/07/2014 2303   BILIRUBINUR NEGATIVE 04/07/2014 2303   KETONESUR NEGATIVE 04/07/2014 2303   PROTEINUR NEGATIVE 04/07/2014 2303   UROBILINOGEN 0.2 04/07/2014 2303   NITRITE NEGATIVE 04/07/2014 2303   LEUKOCYTESUR NEGATIVE 04/07/2014 2303   Sepsis Labs: @LABRCNTIP (procalcitonin:4,lacticidven:4) )No results found for this or any previous visit (from the past 240 hour(s)).   Radiological Exams on Admission: Dg Hip Unilat  With Pelvis 2-3 Views Right  Result Date: 06/19/2017 CLINICAL DATA:  Fall with right hip pain EXAM: DG HIP (WITH OR WITHOUT PELVIS) 2-3V RIGHT COMPARISON:  05/30/2013 FINDINGS: Status post surgical rodding of the left femur with intact hardware. Old fracture deformity of the left femoral trochanter. Pubic symphysis is intact. The rami also appear intact. Mild feces impaction in the rectum. SI joints are symmetric. Comminuted  and slightly impacted intertrochanteric fracture of the right proximal femur. No dislocation. Mild to moderate arthritis of the right hip IMPRESSION: 1. Acute slightly impacted intertrochanteric fracture of the proximal right femur. No dislocation 2. Status post intramedullary rodding of left femur across old fracture deformity Electronically Signed   By: Donavan Foil M.D.   On: 06/19/2017 23:03    EKG: Independently reviewed. Sinus rhythm, rate 76  Assessment/Plan  Right hip fracture -Secondary to fall -Right hip x-ray showed acute slightly impacted intertrochanteric fracture of the proximal right femur. No  dislocation -Patient will be transferred to [place] consulted and appreciated. Dr. Dina Rich spoke with Dr. Doran Durand (ortho at [facility]) -Given patient's age and medical history (coomorbidities) she is at moderate to high risk for surgery -NPO  -Continue pain control -Patient did have left hip surgery by Dr. Luna Glasgow in 2014 -Echocardiogram ordered for preoperative assessment  Chronic diastolic heart failure -Currently appears to be euvolemic -Echocardiogram 01/29/2013 showed an EF of 16-10%, grade 1 diastolic dysfunction -Hold lasix -Monitor intake and output, daily weights -Will obtain repeat echocardiogram for preoperative assessment -CXR: Cardiomegaly with diffuse pulm vasc congestion without edema  Essential hypertension -Continue lisinopril -Lasix held  Osteoporosis with multiple fractures -Continue calcium/vitamin D supplementation -Will check vitamin D level  Depression -Continue Celexa  COPD -Currently stable and at baseline per patient and family  -Daughter feels patient uses her inhalers "too much" -continue home medications as needed  History of Coronary artery disease -Continue lisinopril -Aspirin held, restart after surgery -not on a betablocker or statin -patient unaware of this history  DVT prophylaxis: SCDs  Code Status: Full  Family Communication: Family at bedside. Admission, patients condition and plan of care including tests being ordered have been discussed with the patient and family who indicate understanding and agree with the plan and Code Status.  Disposition Plan: To be determined  Consults called: Orthopedics, Dr. Doran Durand, by EDP   Admission status: Inpatient   Time spent: 70 minutes  Claudene Gatliff D.O. [facility] Pager [phone]  If 7PM-7AM, please contact night-coverage [email or web address] Password [email or web address] 06/20/2017, 12:08 AM

## 2017-06-20 NOTE — Care Management Note (Signed)
Case Management Note  Patient Details  Name: Tiffany Maxwell MRN: [id number] Date of Birth: January 03, 1934  Subjective/Objective:     Pt s/p fall , suffered a comminuted right intertrochanteric femur fracture ,  Hx of  CHF,COPD, home oxygen 2l. Uses walker with ambulation PTA.       Alyra Patty ([address] Blima Ledger (Daughter)    [phone]     PCP: [name]  Action/Plan: Plan per orthopedic surgeon: intramedullary fixation of her right intertrochanteric femur fracture CM received consult: home health needs. CM to follow up with home health needs after hip repair.  Expected Discharge Date:              Expected Discharge Plan:  [place]  In-House Referral:     Discharge planning Services  CM Consult  Post Acute Care Choice:  Durable Medical Equipment, Resumption of Svcs/PTA Provider Choice offered to:  Patient  DME Arranged:  Oxygen DME Agency:     HH Arranged:    [place] Agency:     Status of Service:  In process, will continue to follow  If discussed at Long Length of Stay Meetings, dates discussed:    Additional Comments:  Sharin Mons, RN 06/20/2017, 8:51 AM

## 2017-06-20 NOTE — Consult Note (Signed)
ORTHOPAEDIC CONSULTATION  REQUESTING PHYSICIAN: Mariel Aloe, MD  PCP:  [name], FNP  Chief Complaint: Right hip pain  HPI: Tiffany Maxwell is a 83 y.o. female who complains of right hip pain after she slipped and fell in her bathroom last night. She had right hip pain and inability to weight-bear. She was taken to the emergency department at [facility], where x-rays revealed a comminuted right intertrochanteric femur fracture. She was transferred to [facility] She was admitted by the hospitalist service. Orthopedic consultation was placed for management of her right hip fracture. She does have a history of COPD, and she uses 2 L of oxygen at home. She normally uses a walker at home. She denies other injuries other than right knee pain.  Past Medical History:  Diagnosis Date  . Cholelithiasis 01/30/2013  . Compression fracture of lumbar spine, non-traumatic ([place]) 04/09/2014  . COPD (chronic obstructive pulmonary disease) ([place])   . Diastolic dysfunction 0/86/7619   Grade 1. Ejection fraction 65%.  . History of tobacco use 01/30/2013  . Hyperlipidemia   . Hypertension   . MI (myocardial infarction) ([place])    pt unaware  . Multiple fractures of thoracic spine, closed ([place]) 01/30/2013  . Osteoporosis   . PAC (premature atrial contraction)   . Palpitations   . Pulmonary emphysema ([place]) 01/30/2013  . Seborrheic keratosis    Past Surgical History:  Procedure Laterality Date  . COMPRESSION HIP SCREW Left 04/29/2013   Procedure: COMPRESSION HIP;  Surgeon: Sanjuana Kava, MD;  Location: [facility];  Service: Orthopedics;  Laterality: Left;  [place]  . TUBAL LIGATION     Social History   Social History  . Marital status: Widowed    Spouse name: N/A  . Number of children: N/A  . Years of education: N/A   Social History Main Topics  . Smoking status: Former Smoker    Packs/day: 1.00    Years: 40.00    Types: Cigarettes    Quit date: 07/10/2012  .  Smokeless tobacco: Former Systems developer  . Alcohol use No  . Drug use: No  . Sexual activity: No   Other Topics Concern  . None   Social History Narrative  . None   Family History  Problem Relation Age of Onset  . Hypertension Mother    Allergies  Allergen Reactions  . Codeine Other (See Comments)    headache  . Benicar Hct [Olmesartan Medoxomil-Hctz] Swelling and Rash  . Naproxen Itching, Rash and Other (See Comments)    Redness/flushing of skin   Prior to Admission medications   Medication Sig Start Date End Date Taking? Authorizing Provider  aspirin EC 81 MG tablet Take 81 mg by mouth daily.    [provider]  benzonatate (TESSALON) 100 MG capsule TAKE 1 CAPSULE TWICE DAILY AS NEEDED FOR COUGH 06/07/17   Hassell Done, Mary-Margaret, FNP  Calcium Carbonate-Vitamin D (CALCIUM 600+D3) 600-400 MG-UNIT per tablet Take 1 tablet by mouth 2 (two) times daily. Patient taking differently: Take 1 tablet by mouth daily.  04/09/14   Rexene Alberts, MD  citalopram (CELEXA) 40 MG tablet Take 1 tablet (40 mg total) by mouth daily. 02/07/17   [name], FNP  furosemide (LASIX) 20 MG tablet TAKE (1) TABLET DAILY FOR (EXCESSIVE) FLUID. 02/07/17   Hassell Done, Mary-Margaret, FNP  Ipratropium-Albuterol (COMBIVENT RESPIMAT) 20-100 MCG/ACT AERS respimat Inhale 1 puff into the lungs every 6 (six) hours as needed for wheezing. 02/07/17   [name], FNP  lisinopril (PRINIVIL,ZESTRIL) 40 MG tablet Take 1 tablet (40 mg total) by mouth daily. 02/07/17   Hassell Done, Mary-Margaret, FNP  polyethylene glycol powder (GLYCOLAX/MIRALAX) powder 17 GRAMS (1 CAPFUL) ONCE A DAY AS DIRECTED 07/03/16   Hassell Done, Mary-Margaret, FNP  potassium chloride SA (K-DUR,KLOR-CON) 20 MEQ tablet Take 1 tablet (20 mEq total) by mouth daily. 02/07/17   Hassell Done Mary-Margaret, FNP  VENTOLIN HFA 108 (90 Base) MCG/ACT inhaler 2 PUFFS EVERY 6 HOURS AS NEEDED FOR WHEEZING OR SHORTNESS OF BREATH 04/03/17   [name], FNP   Dg  Chest Portable 1 View  Result Date: 06/20/2017 CLINICAL DATA:  Initial evaluation for hip fracture, history of COPD. EXAM: PORTABLE CHEST 1 VIEW COMPARISON:  Prior radiograph from 06/18/2016. FINDINGS: Moderate cardiomegaly, stable. Mediastinal silhouette within normal limits. Aortic atherosclerosis. Lungs normally inflated. Underlying COPD. Diffuse pulmonary vascular congestion without frank pulmonary edema. No focal infiltrates. No pleural effusion. No pneumothorax. No acute osseus abnormality.  Osteopenia. IMPRESSION: 1. Cardiomegaly with diffuse pulmonary vascular congestion without frank pulmonary edema. 2. Underlying COPD. 3. Aortic atherosclerosis. Electronically Signed   By: Jeannine Boga M.D.   On: 06/20/2017 00:13   Dg Hip Unilat  With Pelvis 2-3 Views Right  Result Date: 06/19/2017 CLINICAL DATA:  Fall with right hip pain EXAM: DG HIP (WITH OR WITHOUT PELVIS) 2-3V RIGHT COMPARISON:  05/30/2013 FINDINGS: Status post surgical rodding of the left femur with intact hardware. Old fracture deformity of the left femoral trochanter. Pubic symphysis is intact. The rami also appear intact. Mild feces impaction in the rectum. SI joints are symmetric. Comminuted and slightly impacted intertrochanteric fracture of the right proximal femur. No dislocation. Mild to moderate arthritis of the right hip IMPRESSION: 1. Acute slightly impacted intertrochanteric fracture of the proximal right femur. No dislocation 2. Status post intramedullary rodding of left femur across old fracture deformity Electronically Signed   By: Donavan Foil M.D.   On: 06/19/2017 23:03    Positive ROS: All other systems have been reviewed and were otherwise negative with the exception of those mentioned in the HPI and as above.  Physical Exam: General: Alert, no acute distress Cardiovascular: No pedal edema Respiratory: No cyanosis, no use of accessory musculature GI: No organomegaly, abdomen is soft and non-tender Skin: No  lesions in the area of chief complaint Neurologic: Sensation intact distally Psychiatric: Patient is competent for consent with normal mood and affect Lymphatic: No axillary or cervical lymphadenopathy  MUSCULOSKELETAL: Examination of the right lower extremity reveals no skin wounds or lesions. She is shortened and externally rotated. She has pain with attempted logrolling of the hip. The knee is nonswollen without evidence of trauma. She is neurovascularly intact distally.  Assessment: Comminuted right intertrochanteric femur fracture Multiple medical problems including COPD with home oxygen use  Plan: I discussed the findings with the patient. She has an unstable right hip fracture. I recommended intramedullary fixation of her right intertrochanteric femur fracture. We discussed the risks, benefits, and alternatives. She has an echocardiogram pending today. We will plan for surgery later this afternoon. Continue nothing by mouth. Hold chemical DVT prophylaxis for now.  The risks, benefits, and alternatives were discussed with the patient. There are risks associated with the surgery including, but not limited to, problems with anesthesia (death), infection, differences in leg length/angulation/rotation, fracture of bones, loosening or failure of implants, malunion, nonunion, hematoma (blood accumulation) which may require surgical drainage, blood clots, pulmonary embolism, nerve injury (foot drop), and blood vessel injury. The patient understands these risks and elects  to proceed.   Nicolaas Savo, Horald Pollen, MD Cell [phone]    06/20/2017 8:10 AM

## 2017-06-20 NOTE — Anesthesia Preprocedure Evaluation (Addendum)
Anesthesia Evaluation  Patient identified by MRN, date of birth, ID band Patient awake    Reviewed: Allergy & Precautions, NPO status , Patient's Chart, lab work & pertinent test results  Airway Mallampati: II  TM Distance: >3 FB Neck ROM: Full    Dental no notable dental hx.    Pulmonary COPD, former smoker,    Pulmonary exam normal breath sounds clear to auscultation       Cardiovascular hypertension, Pt. on medications + Past MI and +CHF  Normal cardiovascular exam Rhythm:Regular Rate:Normal  Echo 06/2017 - Left ventricle: Septal and apical including inferior and anterior apical hypokinesis Mid and basal function normal. The cavity size was mildly dilated. Wall thickness was increased in a pattern of mild LVH. The estimated ejection fraction was 40%. Doppler parameters are consistent with abnormal left ventricular relaxation (grade 1 diastolic dysfunction). - Aortic valve: There was trivial regurgitation. - Mitral valve: Calcified annulus. - Atrial septum: A patent foramen ovale cannot be excluded.   Neuro/Psych negative neurological ROS  negative psych ROS   GI/Hepatic negative GI ROS, Neg liver ROS,   Endo/Other  negative endocrine ROS  Renal/GU negative Renal ROS     Musculoskeletal negative musculoskeletal ROS (+)   Abdominal   Peds  Hematology negative hematology ROS (+)   Anesthesia Other Findings   Reproductive/Obstetrics negative OB ROS                            Anesthesia Physical Anesthesia Plan  ASA: III  Anesthesia Plan: General   Post-op Pain Management:    Induction: Intravenous  PONV Risk Score and Plan: 3 and Ondansetron, Dexamethasone, Propofol and Midazolam  Airway Management Planned: Oral ETT  Additional Equipment:   Intra-op Plan:   Post-operative Plan: Extubation in OR  Informed Consent: I have reviewed the patients History and Physical, chart, labs  and discussed the procedure including the risks, benefits and alternatives for the proposed anesthesia with the patient or authorized representative who has indicated his/her understanding and acceptance.   Dental advisory given  Plan Discussed with: CRNA  Anesthesia Plan Comments:         Anesthesia Quick Evaluation

## 2017-06-20 NOTE — Discharge Instructions (Signed)
°Dr. Gamal Todisco °Adult Hip & Knee Specialist °[facility] °[address] °[place], [place] [address] °[phone] ° ° °POSTOPERATIVE DIRECTIONS ° ° ° °Hip Rehabilitation, Guidelines Following Surgery  ° °WEIGHT BEARING °Partial weight bearing with assist device as directed.  touch down weight bearing ° ° °HOME CARE INSTRUCTIONS  °Remove items at home which could result in a fall. This includes throw rugs or furniture in walking pathways.  °Continue medications as instructed at time of discharge. °· You may have some home medications which will be placed on hold until you complete the course of blood thinner medication. °· 4 days after discharge, you may start showering. No tub baths or soaking your incisions. °Do not put on socks or shoes without following the instructions of your caregivers.   °Sit on chairs with arms. Use the chair arms to help push yourself up when arising.  °Arrange for the use of a toilet seat elevator so you are not sitting low.  °· Walk with walker as instructed.  °You may resume a sexual relationship in one month or when given the OK by your caregiver.  °Use walker as long as suggested by your caregivers.  °Avoid periods of inactivity such as sitting longer than an hour when not asleep. This helps prevent blood clots.  °You may return to work once you are cleared by your surgeon.  °Do not drive a car for 6 weeks or until released by your surgeon.  °Do not drive while taking narcotics.  °Wear elastic stockings for two weeks following surgery during the day but you may remove then at night.  °Make sure you keep all of your appointments after your operation with all of your doctors and caregivers. You should call the office at the above phone number and make an appointment for approximately two weeks after the date of your surgery. °Please pick up a stool softener and laxative for home use as long as you are requiring pain medications. °· ICE to the affected hip  every three hours for 30 minutes at a time and then as needed for pain and swelling. Continue to use ice on the hip for pain and swelling from surgery. You may notice swelling that will progress down to the foot and ankle.  This is normal after surgery.  Elevate the leg when you are not up walking on it.   °It is important for you to complete the blood thinner medication as prescribed by your doctor. °· Continue to use the breathing machine which will help keep your temperature down.  It is common for your temperature to cycle up and down following surgery, especially at night when you are not up moving around and exerting yourself.  The breathing machine keeps your lungs expanded and your temperature down. ° °RANGE OF MOTION AND STRENGTHENING EXERCISES  °These exercises are designed to help you keep full movement of your hip joint. Follow your caregiver's or physical therapist's instructions. Perform all exercises about fifteen times, three times per day or as directed. Exercise both hips, even if you have had only one joint replacement. These exercises can be done on a training (exercise) mat, on the floor, on a table or on a bed. Use whatever works the best and is most comfortable for you. Use music or television while you are exercising so that the exercises are a pleasant break in your day. This will make your life better with the exercises acting as a break in routine you can   look forward to.  °Lying on your back, slowly slide your foot toward your buttocks, raising your knee up off the floor. Then slowly slide your foot back down until your leg is straight again.  °Lying on your back spread your legs as far apart as you can without causing discomfort.  °Lying on your side, raise your upper leg and foot straight up from the floor as far as is comfortable. Slowly lower the leg and repeat.  °Lying on your back, tighten up the muscle in the front of your thigh (quadriceps muscles). You can do this by keeping your  leg straight and trying to raise your heel off the floor. This helps strengthen the largest muscle supporting your knee.  °Lying on your back, tighten up the muscles of your buttocks both with the legs straight and with the knee bent at a comfortable angle while keeping your heel on the floor.  ° °SKILLED REHAB INSTRUCTIONS: °If the patient is transferred to a skilled rehab facility following release from the hospital, a list of the current medications will be sent to the facility for the patient to continue.  When discharged from the skilled rehab facility, please have the facility set up the patient's Home Health Physical Therapy prior to being released. Also, the skilled facility will be responsible for providing the patient with their medications at time of release from the facility to include their pain medication and their blood thinner medication. If the patient is still at the rehab facility at time of the two week follow up appointment, the skilled rehab facility will also need to assist the patient in arranging follow up appointment in our office and any transportation needs. ° °MAKE SURE YOU:  °Understand these instructions.  °Will watch your condition.  °Will get help right away if you are not doing well or get worse. ° °Pick up stool softner and laxative for home use following surgery while on pain medications. °Daily dry dressing changes as needed. °In 4 days, you may remove your dressings and begin taking showers - no tub baths or soaking the incisions. °Continue to use ice for pain and swelling after surgery. °Do not use any lotions or creams on the incision until instructed by your surgeon. ° ° °

## 2017-06-20 NOTE — Op Note (Signed)
OPERATIVE REPORT  SURGEON: Rod Can, MD   ASSISTANT: Staff.  PREOPERATIVE DIAGNOSIS: Right intertrochanteric femur fracture.   POSTOPERATIVE DIAGNOSIS: Right intertrochanteric femur fracture.   PROCEDURE: Intramedullary fixation, Right femur.   IMPLANTS: Biomet Affixus Hip Fracture Nail, 11 by 180 mm, 130 degrees. 90 x 10.5 mm Hip Fracture Nail Lag Screw. 5 x 32 mm distal interlocking screw 1.  ANESTHESIA:  General  ESTIMATED BLOOD LOSS: 150 mL.  ANTIBIOTICS: 2 g Ancef.  DRAINS: None.  COMPLICATIONS: None.   CONDITION: PACU - hemodynamically stable.Marland Kitchen   BRIEF CLINICAL NOTE: Tiffany Maxwell is a 83 y.o. female who presented with an intertrochanteric femur fracture. The patient was admitted to the hospitalist service and underwent perioperative risk stratification and medical optimization. The risks, benefits, and alternatives to the procedure were explained, and the patient elected to proceed.  PROCEDURE IN DETAIL: Surgical site was marked by myself. The patient was taken to the operating room and anesthesia was induced on the bed. The patient was then transferred to the [facility] table and the nonoperative lower extremity was scissored underneath the operative side. The fracture was reduced with traction, internal rotation, and adduction. The hip was prepped and draped in the normal sterile surgical fashion. Timeout was called verifying side and site of surgery. Preop antibiotics were given with 60 minutes of beginning the procedure.  Fluoroscopy was used to define the patient's anatomy. A 4 cm incision was made just proximal to the tip of the greater trochanter. The awl was used to obtain the standard starting point for a trochanteric entry nail under fluoroscopic control. The guidepin was placed. The entry reamer was used to open the proximal femur.  On the back table, the nail was assembled onto the jig. The nail was placed into the femur without any difficulty. Through a  separate stab incision, the cannula was placed down to the bone in preparation for the cephalomedullary device. A guidepin was placed into the femoral head using AP and lateral fluoroscopy views. The pin was measured, and then reaming was performed to the appropriate depth. The lag screw was inserted to the appropriate depth. The fracture was compressed through the jig. The setscrew was tightened and then loosened one quarter turn. A separate stab incision was created, and the distal interlocking screw was placed using standard AO technique. The jig was removed. Final AP and lateral fluoroscopy views were obtained to confirm fracture reduction and hardware placement. Tip apex distance was appropriate. There was no chondral penetration.  The wounds were copiously irrigated with saline. The wound was closed in layers with #1 Vicryl for the fascia, 2-0 Monocryl for the deep dermal layer, and 3-0 Monocryl subcuticular stitch. Glue was applied to the skin. Once the glue was fully hardened, sterile dressing was applied. The patient was then awakened from anesthesia and taken to the PACU in stable condition. Sponge needle and instrument counts were correct at the end of the case 2. There were no known complications.  We will readmit the patient to the hospitalist. Weightbearing status will be touchdown weightbearing with a walker. We will begin Lovenox for DVT prophylaxis. The patient will work with physical therapy and undergo disposition planning.

## 2017-06-20 NOTE — Transfer of Care (Signed)
Immediate Anesthesia Transfer of Care Note  Patient: Tiffany Maxwell  Procedure(s) Performed: Procedure(s): INTRAMEDULLARY (IM) NAIL FEMORAL (Right)  Patient Location: PACU  Anesthesia Type:General  Level of Consciousness: sedated, drowsy, patient cooperative and responds to stimulation  Airway & Oxygen Therapy: Patient Spontanous Breathing and Patient connected to nasal cannula oxygen  Post-op Assessment: Report given to RN, Post -op Vital signs reviewed and stable and Patient moving all extremities X 4  Post vital signs: Reviewed and stable  Last Vitals:  Vitals:   06/20/17 0544 06/20/17 1510  BP: (!) 143/76 (!) 142/78  Pulse: 71 78  Resp:  18  Temp:  37.1 C    Last Pain:  Vitals:   06/20/17 1510  TempSrc: Oral  PainSc:       Patients Stated Pain Goal: 4 (47/65/46 5035)  Complications: No apparent anesthesia complications

## 2017-06-20 NOTE — Progress Notes (Signed)
  Echocardiogram 2D Echocardiogram has been performed.  Tiffany Maxwell 06/20/2017, 11:54 AM

## 2017-06-20 NOTE — Progress Notes (Signed)
Initial Nutrition Assessment  DOCUMENTATION CODES:   Severe malnutrition in context of chronic illness  INTERVENTION:   Ensure Enlive po BID, each supplement provides 350 kcal and 20 grams of protein  MVI  NUTRITION DIAGNOSIS:   Malnutrition (severe) related to catabolic illness (COPD, heart disease, advanced age) as evidenced by severe depletion of muscle mass, severe depletion of body fat, 11 percent weight loss in 5 months and 15% wt loss in 8 months.  GOAL:   Patient will meet greater than or equal to 90% of their needs  MONITOR:   PO intake, Supplement acceptance, Labs, Weight trends  REASON FOR ASSESSMENT:   Consult Hip fracture protocol  ASSESSMENT:    83 y.o. female with a medical history of diastolic heart failure, COPD on home oxygen, multiple fractures of the spine, presented with right hip pain. Found to have hip fracture    Met with pt in room today. Pt is a poor historian but reports good appetite pta and currently. Pt is asking if she can have food at time of RD visit; pt NPO for surgery today. Per chart, pt has lost 12lbs(11%) in 5 months and 18lbs(15%) in 8 months. This is significant. Pt likes Ensure; will order for when diet advanced.   Medications reviewed and include: Ca-Vit D, celexa, hydrocodone    Labs reviewed: Na 134(L), Cl 92(L), creat 0.41(L) cbgs- 141, 136 x 24 hrs  Nutrition-Focused physical exam completed. Findings are severe fat depletion in orbitals, arms, and chest, severe muscle depletion over entire body, and no edema.   Diet Order:  Diet NPO time specified  Skin:  Reviewed, no issues  Last BM:  none since admit   Height:   Ht Readings from Last 1 Encounters:  06/20/17 5' 4"  (1.626 m)    Weight:   Wt Readings from Last 1 Encounters:  06/20/17 100 lb (45.4 kg)    Ideal Body Weight:  54.5 kg  BMI:  Body mass index is 17.16 kg/m.  Estimated Nutritional Needs:   Kcal:  1400-1600kcal/day   Protein:  68-77g/day    Fluid:  >1.4L/day   EDUCATION NEEDS:   Education needs addressed  Koleen Distance MS, RD, LDN Pager #[phone] After Hours Pager: [phone]

## 2017-06-20 NOTE — Progress Notes (Signed)
Tiffany Maxwell [id number] Admitted to 5W24: 06/20/2017 6:02 AM Attending Provider: Cristal Ford, DO    Tiffany Maxwell is a 83 y.o. female patient admitted from ED awake, alert To self,  Full Code, VSS - Blood pressure (!) 143/76, pulse 71, temperature 98.6 F (37 C), resp. rate 18, height 5\' 4"  (1.626 m), weight 45.4 kg (100 lb), SpO2 100 %., O2   4L nasal cannular, no c/o shortness of breath, no c/o chest pain, no distress noted. Tele 19  placed and pt is currently running: SR   IV site WDL:  with a transparent dsg that's clean dry and intact.  Allergies:   Allergies  Allergen Reactions  . Codeine Other (See Comments)    headache  . Benicar Hct [Olmesartan Medoxomil-Hctz] Swelling and Rash  . Naproxen Itching, Rash and Other (See Comments)    Redness/flushing of skin     Past Medical History:  Diagnosis Date  . Cholelithiasis 01/30/2013  . Compression fracture of lumbar spine, non-traumatic ([place]) 04/09/2014  . COPD (chronic obstructive pulmonary disease) ([place])   . Diastolic dysfunction 8/81/1031   Grade 1. Ejection fraction 65%.  . History of tobacco use 01/30/2013  . Hyperlipidemia   . Hypertension   . MI (myocardial infarction) ([place])    pt unaware  . Multiple fractures of thoracic spine, closed ([place]) 01/30/2013  . Osteoporosis   . PAC (premature atrial contraction)   . Palpitations   . Pulmonary emphysema ([place]) 01/30/2013  . Seborrheic keratosis     History:    Pt orientation to unit, room and routine. Information packet placed at bedside.  Admission INP armband ID verified and in place. SR up x 2, fall risk assessment completed. Pt shown how to use the call bell. Skin, scattered bruising, skin tear on left elbow, and a stage I on sacrum.  Foams applied to tear and sacrum.  Patient placed on low bed with floor mats.  Ortho PA on call notiified that patient arrived to the unit.   Will cont to monitor and assist as needed.  Parthenia Ames, RN 06/20/2017  6:02 AM

## 2017-06-20 NOTE — Progress Notes (Signed)
Patient seen and examined at bedside, patient admitted after midnight, please see earlier detailed admission note by Cristal Ford, DO. Briefly, patient presented with a hip fracture after trying to sit on the commode and falling between the commode and sink. No acute abnormality on CT head. Plan for surgery today per orthopedic surgery. SW consult for likely placement requirement. PT eval after surgery per ortho recommendations.   Cordelia Poche, MD [facility] 06/20/2017, 10:40 AM Pager: [phone]

## 2017-06-20 NOTE — ED Provider Notes (Addendum)
[facility] DEPT Provider Note   CSN: [id number] Arrival date & time: 06/19/17  2228     History   Chief Complaint Chief Complaint  Patient presents with  . Hip Pain    right    HPI Tiffany Maxwell is a 83 y.o. female.  HPI  This is an 83 year old female with history COPD and chronic respiratory failure, hypertension, hyperlipidemia who presents with right hip pain. Patient reports that she missed the toilet when sitting down to the bathroom. She fell between the toilet and the sink onto her right hip. She has right hip pain with movement. She has not been ambulatory. Currently she is pain-free when lying still. Denies other injury. Denies hitting her head or loss of consciousness. She previously has fractured her left hip and had surgery several years ago by Dr. Luna Glasgow.  She denies any numbness or tingling in leg. At baseline she is on home oxygen.  Past Medical History:  Diagnosis Date  . Cholelithiasis 01/30/2013  . Compression fracture of lumbar spine, non-traumatic ([place]) 04/09/2014  . COPD (chronic obstructive pulmonary disease) ([place])   . Diastolic dysfunction 0/17/5102   Grade 1. Ejection fraction 65%.  . History of tobacco use 01/30/2013  . Hyperlipidemia   . Hypertension   . MI (myocardial infarction) ([place])    pt unaware  . Multiple fractures of thoracic spine, closed ([place]) 01/30/2013  . Osteoporosis   . PAC (premature atrial contraction)   . Palpitations   . Pulmonary emphysema ([place]) 01/30/2013  . Seborrheic keratosis     Patient Active Problem List   Diagnosis Date Noted  . GAD (generalized anxiety disorder) 11/06/2016  . Back pain 04/08/2014  . Multiple fractures of thoracic spine, closed ([place]) 01/30/2013  . Pulmonary emphysema ([place]) 01/30/2013  . History of tobacco use 01/30/2013  . Diastolic dysfunction 58/52/7782  . Hypokalemia 01/29/2013  . Osteoporosis 01/29/2013  . COPD with chronic bronchitis ([place]) 01/28/2013  . Compression fracture 01/28/2013    . HTN (hypertension) 01/28/2013    Past Surgical History:  Procedure Laterality Date  . COMPRESSION HIP SCREW Left 04/29/2013   Procedure: COMPRESSION HIP;  Surgeon: Sanjuana Kava, MD;  Location: [facility];  Service: Orthopedics;  Laterality: Left;  [place]  . TUBAL LIGATION      OB History    No data available       Home Medications    Prior to Admission medications   Medication Sig Start Date End Date Taking? Authorizing Provider  aspirin EC 81 MG tablet Take 81 mg by mouth daily.    [provider]  benzonatate (TESSALON) 100 MG capsule TAKE 1 CAPSULE TWICE DAILY AS NEEDED FOR COUGH 06/07/17   Hassell Done, Mary-Margaret, FNP  Calcium Carbonate-Vitamin D (CALCIUM 600+D3) 600-400 MG-UNIT per tablet Take 1 tablet by mouth 2 (two) times daily. Patient taking differently: Take 1 tablet by mouth daily.  04/09/14   Rexene Alberts, MD  citalopram (CELEXA) 40 MG tablet Take 1 tablet (40 mg total) by mouth daily. 02/07/17   Hassell Done Mary-Margaret, FNP  furosemide (LASIX) 20 MG tablet TAKE (1) TABLET DAILY FOR (EXCESSIVE) FLUID. 02/07/17   Hassell Done, Mary-Margaret, FNP  Ipratropium-Albuterol (COMBIVENT RESPIMAT) 20-100 MCG/ACT AERS respimat Inhale 1 puff into the lungs every 6 (six) hours as needed for wheezing. 02/07/17   Hassell Done, Mary-Margaret, FNP  lisinopril (PRINIVIL,ZESTRIL) 40 MG tablet Take 1 tablet (40 mg total) by mouth daily. 02/07/17   Hassell Done, Mary-Margaret, FNP  polyethylene glycol powder (GLYCOLAX/MIRALAX) powder 17 GRAMS (1  CAPFUL) ONCE A DAY AS DIRECTED 07/03/16   Hassell Done, Mary-Margaret, FNP  potassium chloride SA (K-DUR,KLOR-CON) 20 MEQ tablet Take 1 tablet (20 mEq total) by mouth daily. 02/07/17   Hassell Done Mary-Margaret, FNP  VENTOLIN HFA 108 (90 Base) MCG/ACT inhaler 2 PUFFS EVERY 6 HOURS AS NEEDED FOR WHEEZING OR SHORTNESS OF BREATH 04/03/17   [name], FNP    Family History Family History  Problem Relation Age of Onset  . Hypertension Mother     Social  History Social History  Substance Use Topics  . Smoking status: Former Smoker    Packs/day: 1.00    Years: 40.00    Types: Cigarettes    Quit date: 07/10/2012  . Smokeless tobacco: Former Systems developer  . Alcohol use No     Allergies   Codeine; Benicar hct [olmesartan medoxomil-hctz]; and Naproxen   Review of Systems Review of Systems  Respiratory: Negative for shortness of breath.   Cardiovascular: Negative for chest pain.  Gastrointestinal: Negative for abdominal pain, nausea and vomiting.  Musculoskeletal:       Right hip pain  Neurological: Negative for dizziness, syncope and numbness.  All other systems reviewed and are negative.    Physical Exam Updated Vital Signs BP 136/67   Pulse 84   Temp 98.3 F (36.8 C) (Oral)   Resp 19   Ht 5\' 4"  (1.626 m)   Wt 50.8 kg (112 lb)   SpO2 97%   BMI 19.22 kg/m   Physical Exam  Constitutional: She is oriented to person, place, and time.  Elderly, chronically ill-appearing, no acute distress  HENT:  Head: Normocephalic and atraumatic.  Mucous membranes dry  Cardiovascular: Normal rate, regular rhythm and normal heart sounds.   No murmur heard. Pulmonary/Chest: Effort normal. No respiratory distress. She has wheezes.  Diffuse expiratory wheezes, nasal cannula in place  Abdominal: Soft. Bowel sounds are normal. There is no tenderness. There is no guarding.  Musculoskeletal: She exhibits no edema.  Limited range of motion of the right hip secondary to pain, mild foreshortening noted, no obvious deformity, 2+ DP pulse, neurovascularly intact distally  Neurological: She is alert and oriented to person, place, and time.  Skin: Skin is warm and dry.  Psychiatric: She has a normal mood and affect.  Nursing note and vitals reviewed.    ED Treatments / Results  Labs (all labs ordered are listed, but only abnormal results are displayed) Labs Reviewed  CBC WITH DIFFERENTIAL/PLATELET - Abnormal; Notable for the following:        Result Value   Neutro Abs 8.0 (*)    All other components within normal limits  BASIC METABOLIC PANEL - Abnormal; Notable for the following:    Chloride 91 (*)    Glucose, Bld 141 (*)    Creatinine, Ser 0.36 (*)    All other components within normal limits    EKG  EKG Interpretation  Date/Time:  Tuesday June 19 2017 23:20:32 EDT Ventricular Rate:  76 PR Interval:    QRS Duration: 88 QT Interval:  406 QTC Calculation: 457 R Axis:   44 Text Interpretation:  Sinus rhythm Anteroseptal infarct, age indeterminate Confirmed by Thayer Jew ([id number]) on 06/20/2017 12:27:15 AM       Radiology Dg Chest Portable 1 View  Result Date: 06/20/2017 CLINICAL DATA:  Initial evaluation for hip fracture, history of COPD. EXAM: PORTABLE CHEST 1 VIEW COMPARISON:  Prior radiograph from 06/18/2016. FINDINGS: Moderate cardiomegaly, stable. Mediastinal silhouette within normal limits. Aortic atherosclerosis. Lungs normally inflated. Underlying  COPD. Diffuse pulmonary vascular congestion without frank pulmonary edema. No focal infiltrates. No pleural effusion. No pneumothorax. No acute osseus abnormality.  Osteopenia. IMPRESSION: 1. Cardiomegaly with diffuse pulmonary vascular congestion without frank pulmonary edema. 2. Underlying COPD. 3. Aortic atherosclerosis. Electronically Signed   By: Jeannine Boga M.D.   On: 06/20/2017 00:13   Dg Hip Unilat  With Pelvis 2-3 Views Right  Result Date: 06/19/2017 CLINICAL DATA:  Fall with right hip pain EXAM: DG HIP (WITH OR WITHOUT PELVIS) 2-3V RIGHT COMPARISON:  05/30/2013 FINDINGS: Status post surgical rodding of the left femur with intact hardware. Old fracture deformity of the left femoral trochanter. Pubic symphysis is intact. The rami also appear intact. Mild feces impaction in the rectum. SI joints are symmetric. Comminuted and slightly impacted intertrochanteric fracture of the right proximal femur. No dislocation. Mild to moderate arthritis of the right  hip IMPRESSION: 1. Acute slightly impacted intertrochanteric fracture of the proximal right femur. No dislocation 2. Status post intramedullary rodding of left femur across old fracture deformity Electronically Signed   By: Donavan Foil M.D.   On: 06/19/2017 23:03    Procedures Procedures (including critical care time)  Medications Ordered in ED Medications - No data to display   Initial Impression / Assessment and Plan / ED Course  I have reviewed the triage vital signs and the nursing notes.  Pertinent labs & imaging results that were available during my care of the patient were reviewed by me and considered in my medical decision making (see chart for details).     Patient presents with right hip pain after described mechanical fall. She is overall nontoxic-appearing and vital signs are reassuring. Suspect right hip fracture. No signs or symptoms of injury. She does have chronic respiratory failure. There is no orthopedics on call coverage for Dr. Luna Glasgow today or tomorrow. I discussed the patient with Dr. Doran Durand, orthopedics at [facility]. Plan for surgery likely later today. Make patient nothing by mouth. Patient is only on a daily aspirin. Will obtain lab work and plan to admit to the hospitalist.    Final Clinical Impressions(s) / ED Diagnoses   Final diagnoses:  Closed fracture of right hip, initial encounter [facility])    New Prescriptions New Prescriptions   No medications on file     Merryl Hacker, MD 06/20/17 0028    Merryl Hacker, MD 06/20/17 [phone]

## 2017-06-20 NOTE — Anesthesia Procedure Notes (Addendum)
Procedure Name: Intubation Date/Time: 06/20/2017 9:03 PM Performed by: Claris Che Pre-anesthesia Checklist: Patient identified, Emergency Drugs available, Suction available, Patient being monitored and Timeout performed Oxygen Delivery Method: Circle system utilized Preoxygenation: Pre-oxygenation with 100% oxygen Induction Type: IV induction and Cricoid Pressure applied Ventilation: Mask ventilation without difficulty Laryngoscope Size: Mac and 4 Grade View: Grade II Tube type: Subglottic suction tube Tube size: 7.5 mm Number of attempts: 1 Airway Equipment and Method: Stylet Placement Confirmation: ETT inserted through vocal cords under direct vision,  positive ETCO2 and breath sounds checked- equal and bilateral Secured at: 22 cm Tube secured with: Tape Dental Injury: Teeth and Oropharynx as per pre-operative assessment

## 2017-06-20 NOTE — Anesthesia Postprocedure Evaluation (Signed)
Anesthesia Post Note  Patient: Tiffany Maxwell  Procedure(s) Performed: Procedure(s) (LRB): INTRAMEDULLARY (IM) NAIL FEMORAL (Right)     Patient location during evaluation: PACU Anesthesia Type: General Level of consciousness: sedated and patient cooperative Pain management: pain level controlled Vital Signs Assessment: post-procedure vital signs reviewed and stable Respiratory status: spontaneous breathing Cardiovascular status: stable Anesthetic complications: no    Last Vitals:  Vitals:   06/20/17 0544 06/20/17 1510  BP: (!) 143/76 (!) 142/78  Pulse: 71 78  Resp:  18  Temp:  37.1 C    Last Pain:  Vitals:   06/20/17 1510  TempSrc: Oral  PainSc:                  Nolon Nations

## 2017-06-20 NOTE — Progress Notes (Signed)
Anesthesia MD notified that pt is not answering questions appropriately and according to previous documentation pt was A&Ox4.  The only thing that I have been able to get the patient to say is that her birthday is in "[birth year]", but that is not correct her birthday is in [birth year].  Also she has her right arm flexed like she is going to punch something.  MD is aware and will come to bedside to evaluate before we take the patient back to the room.

## 2017-06-21 ENCOUNTER — Encounter (HOSPITAL_COMMUNITY): Payer: Self-pay | Admitting: Orthopedic Surgery

## 2017-06-21 DIAGNOSIS — M8000XS Age-related osteoporosis with current pathological fracture, unspecified site, sequela: Secondary | ICD-10-CM

## 2017-06-21 DIAGNOSIS — E43 Unspecified severe protein-calorie malnutrition: Secondary | ICD-10-CM

## 2017-06-21 DIAGNOSIS — S72001A Fracture of unspecified part of neck of right femur, initial encounter for closed fracture: Secondary | ICD-10-CM

## 2017-06-21 DIAGNOSIS — S72141A Displaced intertrochanteric fracture of right femur, initial encounter for closed fracture: Secondary | ICD-10-CM | POA: Diagnosis present

## 2017-06-21 LAB — BASIC METABOLIC PANEL
Anion gap: 7 (ref 5–15)
BUN: 14 mg/dL (ref 6–20)
CHLORIDE: 95 mmol/L — AB (ref 101–111)
CO2: 32 mmol/L (ref 22–32)
Calcium: 8.6 mg/dL — ABNORMAL LOW (ref 8.9–10.3)
Creatinine, Ser: 0.43 mg/dL — ABNORMAL LOW (ref 0.44–1.00)
Glucose, Bld: 134 mg/dL — ABNORMAL HIGH (ref 65–99)
POTASSIUM: 3.8 mmol/L (ref 3.5–5.1)
SODIUM: 134 mmol/L — AB (ref 135–145)

## 2017-06-21 LAB — CBC
HCT: 31.5 % — ABNORMAL LOW (ref 36.0–46.0)
HEMOGLOBIN: 10 g/dL — AB (ref 12.0–15.0)
MCH: 28.5 pg (ref 26.0–34.0)
MCHC: 31.7 g/dL (ref 30.0–36.0)
MCV: 89.7 fL (ref 78.0–100.0)
PLATELETS: 170 10*3/uL (ref 150–400)
RBC: 3.51 MIL/uL — AB (ref 3.87–5.11)
RDW: 12.5 % (ref 11.5–15.5)
WBC: 14.4 10*3/uL — AB (ref 4.0–10.5)

## 2017-06-21 LAB — VITAMIN D 25 HYDROXY (VIT D DEFICIENCY, FRACTURES): VIT D 25 HYDROXY: 28.8 ng/mL — AB (ref 30.0–100.0)

## 2017-06-21 MED ORDER — METOCLOPRAMIDE HCL 5 MG PO TABS
5.0000 mg | ORAL_TABLET | Freq: Three times a day (TID) | ORAL | Status: DC | PRN
  Filled 2017-06-21: qty 2

## 2017-06-21 MED ORDER — MENTHOL 3 MG MT LOZG: 1.0000 | LOZENGE | OROMUCOSAL | Status: DC | PRN

## 2017-06-21 MED ORDER — ENOXAPARIN SODIUM 40 MG/0.4ML ~~LOC~~ SOLN: 40.0000 mg | SUBCUTANEOUS | 0 refills | Status: DC

## 2017-06-21 MED ORDER — ACETAMINOPHEN 325 MG PO TABS: 650.0000 mg | ORAL_TABLET | Freq: Four times a day (QID) | ORAL | Status: DC | PRN

## 2017-06-21 MED ORDER — ACETAMINOPHEN 650 MG RE SUPP: 650.0000 mg | Freq: Four times a day (QID) | RECTAL | Status: DC | PRN

## 2017-06-21 MED ORDER — ONDANSETRON HCL 4 MG PO TABS: 4.0000 mg | ORAL_TABLET | Freq: Four times a day (QID) | ORAL | Status: DC | PRN

## 2017-06-21 MED ORDER — PHENOL 1.4 % MT LIQD
1.0000 | OROMUCOSAL | Status: DC | PRN
Start: 2017-06-21 — End: 2017-06-23

## 2017-06-21 MED ORDER — CEFAZOLIN SODIUM-DEXTROSE 2-4 GM/100ML-% IV SOLN
2.0000 g | Freq: Four times a day (QID) | INTRAVENOUS | Status: AC
  Administered 2017-06-21 (×2): 2 g via INTRAVENOUS
  Filled 2017-06-21 (×2): qty 100

## 2017-06-21 MED ORDER — ONDANSETRON HCL 4 MG/2ML IJ SOLN: 4.0000 mg | Freq: Four times a day (QID) | INTRAMUSCULAR | Status: DC | PRN

## 2017-06-21 MED ORDER — HYDROCODONE-ACETAMINOPHEN 5-325 MG PO TABS: 1.0000 | ORAL_TABLET | Freq: Four times a day (QID) | ORAL | 0 refills | Status: DC | PRN

## 2017-06-21 MED ORDER — ENOXAPARIN SODIUM 30 MG/0.3ML ~~LOC~~ SOLN
30.0000 mg | SUBCUTANEOUS | Status: DC
  Administered 2017-06-21 – 2017-06-23 (×3): 30 mg via SUBCUTANEOUS
  Filled 2017-06-21 (×3): qty 0.3

## 2017-06-21 MED ORDER — METOCLOPRAMIDE HCL 5 MG/ML IJ SOLN: 5.0000 mg | Freq: Three times a day (TID) | INTRAMUSCULAR | Status: DC | PRN

## 2017-06-21 MED ORDER — ENOXAPARIN SODIUM 40 MG/0.4ML ~~LOC~~ SOLN: 40.0000 mg | SUBCUTANEOUS | Status: DC

## 2017-06-21 NOTE — Progress Notes (Signed)
PROGRESS NOTE    Tiffany Maxwell  ZOX:[id number] DOB: February 09, 1934 DOA: 06/19/2017 PCP: [name], FNP   Brief Narrative: Tiffany Maxwell is a 83 y.o. remote history of diastolic heart failure, COPD on home oxygen, multiple fractures of the spine, osteoporosis, depression. Patient presented after having a fall landing on her right hip and fracturing it. Patient underwent internal fixation on 7/11. PT and OT to evaluate for likely SNF placement.   Assessment & Plan:   Active Problems:   COPD with chronic bronchitis (HCC)   HTN (hypertension)   Osteoporosis   Multiple fractures of thoracic spine, closed (HCC)   Hip fracture (HCC)   Chronic diastolic CHF (congestive heart failure) (HCC)   Closed comminuted intertrochanteric fracture of proximal end of right femur ([place])   Protein-calorie malnutrition, severe   Right hip fracture S/p internal fixation on 7/11 -PT/OT -CSW for SNF placement -orthopedic surgery recommendations: lovenox x30 days, TED, SCDs, touchdown weight bearing of right LE with walker  Chronic combined systolic and diastolic heart failure Euvolemic. EF 40% with septal, inferior/anterior apical hypokinesis with LVH and grade 1 diastolic dysfunction  Essential hypertension -Continue lisinopril  Osteoporosis History of multiple fractures -continue calcium -continue vitamin D  Depression -continue Celexa  COPD Stable. -continue albuterol prn  History of CAD -continue lisinopril -restart aspirin   DVT prophylaxis: Lovenox Code Status: Full code Family Communication: None at bedside Disposition Plan: Discharge in 24 hours to SNF   Consultants:   Orthopedic surgery  Procedures:   Internal fixation of right hip (7/11)  Antimicrobials:  None    Subjective: No pain.   Objective: Vitals:   06/21/17 0401 06/21/17 0500 06/21/17 0815 06/21/17 1033  BP: 127/85 139/76 125/74 112/63  Pulse: (!) 102 98 93   Resp:   18   Temp:    98.6 F (37 C)   TempSrc:   Oral   SpO2: 100%  100%   Weight:      Height:        Intake/Output Summary (Last 24 hours) at 06/21/17 1113 Last data filed at 06/21/17 0900  Gross per 24 hour  Intake          2820.67 ml  Output              551 ml  Net          2269.67 ml   Filed Weights   06/19/17 2230 06/20/17 0544  Weight: 50.8 kg (112 lb) 45.4 kg (100 lb)    Examination:  General exam: Appears calm and comfortable  Respiratory system: Clear to auscultation. Respiratory effort normal. Cardiovascular system: S1 & S2 heard, RRR. No murmurs, . Gastrointestinal system: Abdomen is nondistended, soft and nontender. Normal bowel sounds heard. Central nervous system: Alert and oriented. Hearing loss. Extremities: No edema. No calf tenderness Skin: No cyanosis. No rashes Psychiatry: Judgement and insight appear normal. Mood & affect appropriate.     Data Reviewed: I have personally reviewed following labs and imaging studies  CBC:  Recent Labs Lab 06/19/17 2354 06/20/17 0708 06/21/17 0512  WBC 9.6 10.4 14.4*  NEUTROABS 8.0*  --   --   HGB 12.0 11.8* 10.0*  HCT 37.3 37.6 31.5*  MCV 90.5 91.9 89.7  PLT 232 210 811   Basic Metabolic Panel:  Recent Labs Lab 06/19/17 2354 06/20/17 0708 06/21/17 0512  NA 135 134* 134*  K 4.1 3.8 3.8  CL 91* 92* 95*  CO2 31 36* 32  GLUCOSE 141* 136* 134*  BUN 13 12 14   CREATININE 0.36* 0.41* 0.43*  CALCIUM 9.8 9.6 8.6*   GFR: Estimated Creatinine Clearance: 39.5 mL/min (A) (by C-G formula based on SCr of 0.43 mg/dL (L)). Liver Function Tests: No results for input(s): AST, ALT, ALKPHOS, BILITOT, PROT, ALBUMIN in the last 168 hours. No results for input(s): LIPASE, AMYLASE in the last 168 hours. No results for input(s): AMMONIA in the last 168 hours. Coagulation Profile: No results for input(s): INR, PROTIME in the last 168 hours. Cardiac Enzymes: No results for input(s): CKTOTAL, CKMB, CKMBINDEX, TROPONINI in the last 168  hours. BNP (last 3 results) No results for input(s): PROBNP in the last 8760 hours. HbA1C: No results for input(s): HGBA1C in the last 72 hours. CBG: No results for input(s): GLUCAP in the last 168 hours. Lipid Profile: No results for input(s): CHOL, HDL, LDLCALC, TRIG, CHOLHDL, LDLDIRECT in the last 72 hours. Thyroid Function Tests: No results for input(s): TSH, T4TOTAL, FREET4, T3FREE, THYROIDAB in the last 72 hours. Anemia Panel: No results for input(s): VITAMINB12, FOLATE, FERRITIN, TIBC, IRON, RETICCTPCT in the last 72 hours. Sepsis Labs: No results for input(s): PROCALCITON, LATICACIDVEN in the last 168 hours.  Recent Results (from the past 240 hour(s))  Surgical PCR screen     Status: Abnormal   Collection Time: 06/20/17 10:49 AM  Result Value Ref Range Status   MRSA, PCR NEGATIVE NEGATIVE Final   Staphylococcus aureus POSITIVE (A) NEGATIVE Final    Comment:        The Xpert SA Assay (FDA approved for NASAL specimens in patients over 72 years of age), is one component of a comprehensive surveillance program.  Test performance has been validated by [facility] for patients greater than or equal to 18 year old. It is not intended to diagnose infection nor to guide or monitor treatment.          Radiology Studies: Pelvis Portable  Result Date: 06/20/2017 CLINICAL DATA:  83 y/o  F; postop right hip nail placement. EXAM: PORTABLE PELVIS 1-2 VIEWS COMPARISON:  06/20/2017 right hip radiographs. FINDINGS: Right proximal femur intramedullary nail fixing an intertrochanteric fracture with proximal screw traversing femoral neck and distal interlocking threaded screw. Air and edema in the soft tissues are compatible postsurgical changes. Lesser trochanter fracture with increased medial avulsion in comparison with prior radiographs. No new acute fracture identified. Left proximal femur hardware without apparent hardware related complication. IMPRESSION: 1. Right femur  intertrochanteric fracture post intramedullary nail fixation with expected postsurgical changes. 2. Right lesser trochanter fracture with interval increase medial displacement. Electronically Signed   By: Kristine Garbe M.D.   On: 06/20/2017 22:57   Dg Chest Portable 1 View  Result Date: 06/20/2017 CLINICAL DATA:  Initial evaluation for hip fracture, history of COPD. EXAM: PORTABLE CHEST 1 VIEW COMPARISON:  Prior radiograph from 06/18/2016. FINDINGS: Moderate cardiomegaly, stable. Mediastinal silhouette within normal limits. Aortic atherosclerosis. Lungs normally inflated. Underlying COPD. Diffuse pulmonary vascular congestion without frank pulmonary edema. No focal infiltrates. No pleural effusion. No pneumothorax. No acute osseus abnormality.  Osteopenia. IMPRESSION: 1. Cardiomegaly with diffuse pulmonary vascular congestion without frank pulmonary edema. 2. Underlying COPD. 3. Aortic atherosclerosis. Electronically Signed   By: Jeannine Boga M.D.   On: 06/20/2017 00:13   Dg C-arm 1-60 Min  Result Date: 06/20/2017 CLINICAL DATA:  Elective surgery. EXAM: RIGHT FEMUR 2 VIEWS; DG C-ARM 61-120 MIN COMPARISON:  Preoperative radiographs earlier this day. FINDINGS: Three fluoroscopic spot images in the operating room demonstrate intramedullary nail with trans trochanteric  lag screw fixating intertrochanteric right femur fracture. Fluoroscopy time 1 minutes 6 seconds. IMPRESSION: Procedural fluoroscopy post intramedullary nail and lag screw fixating intertrochanteric femur fracture. Electronically Signed   By: Jeb Levering M.D.   On: 06/20/2017 22:46   Dg Hip Unilat  With Pelvis 2-3 Views Right  Result Date: 06/19/2017 CLINICAL DATA:  Fall with right hip pain EXAM: DG HIP (WITH OR WITHOUT PELVIS) 2-3V RIGHT COMPARISON:  05/30/2013 FINDINGS: Status post surgical rodding of the left femur with intact hardware. Old fracture deformity of the left femoral trochanter. Pubic symphysis is intact.  The rami also appear intact. Mild feces impaction in the rectum. SI joints are symmetric. Comminuted and slightly impacted intertrochanteric fracture of the right proximal femur. No dislocation. Mild to moderate arthritis of the right hip IMPRESSION: 1. Acute slightly impacted intertrochanteric fracture of the proximal right femur. No dislocation 2. Status post intramedullary rodding of left femur across old fracture deformity Electronically Signed   By: Donavan Foil M.D.   On: 06/19/2017 23:03   Dg Femur, Min 2 Views Right  Result Date: 06/20/2017 CLINICAL DATA:  Elective surgery. EXAM: RIGHT FEMUR 2 VIEWS; DG C-ARM 61-120 MIN COMPARISON:  Preoperative radiographs earlier this day. FINDINGS: Three fluoroscopic spot images in the operating room demonstrate intramedullary nail with trans trochanteric lag screw fixating intertrochanteric right femur fracture. Fluoroscopy time 1 minutes 6 seconds. IMPRESSION: Procedural fluoroscopy post intramedullary nail and lag screw fixating intertrochanteric femur fracture. Electronically Signed   By: Jeb Levering M.D.   On: 06/20/2017 22:46   Dg Femur Port, Min 2 Views Right  Result Date: 06/20/2017 CLINICAL DATA:  Intertrochanteric fracture of proximal femur. EXAM: RIGHT FEMUR PORTABLE 2 VIEW COMPARISON:  None. FINDINGS: Mildly displaced and possibly comminuted fracture is seen involving the intertrochanteric region of proximal right femur. No fracture is seen involving the middle or distal aspects of the right femur. IMPRESSION: Displaced and possibly comminuted intertrochanteric fracture of proximal right femur. No other abnormality seen in the right femur. Electronically Signed   By: Marijo Conception, M.D.   On: 06/20/2017 08:44        Scheduled Meds: . calcium-vitamin D  1 tablet Oral Daily  . Chlorhexidine Gluconate Cloth  6 each Topical Daily  . citalopram  40 mg Oral Daily  . enoxaparin (LOVENOX) injection  30 mg Subcutaneous Q24H  . feeding  supplement (ENSURE ENLIVE)  237 mL Oral BID BM  . lisinopril  40 mg Oral Daily  . multivitamin with minerals  1 tablet Oral Daily  . mupirocin ointment  1 application Nasal BID   Continuous Infusions: . sodium chloride 100 mL/hr at 06/21/17 0259  . lactated ringers 10 mL/hr at 06/20/17 1646  . methocarbamol (ROBAXIN)  IV       LOS: 1 day     Cordelia Poche, MD [facility] 06/21/2017, 11:13 AM Pager: [phone]  If 7PM-7AM, please contact night-coverage [email or web address] Password [email or web address] 06/21/2017, 11:13 AM

## 2017-06-21 NOTE — Progress Notes (Signed)
CM received consult : home health needs. PT/OT evaluations pending....CM to f/u with disposition needs. Tamora Huneke RN,BSN,CM 

## 2017-06-21 NOTE — Progress Notes (Signed)
   Subjective:  Patient reports pain as mild to moderate.  No c/o. Confused o/n.  Objective:   VITALS:   Vitals:   06/21/17 0200 06/21/17 0301 06/21/17 0401 06/21/17 0500  BP: (!) 137/93 140/82 127/85 139/76  Pulse: (!) 103 (!) 104 (!) 102 98  Resp:      Temp:      TempSrc:      SpO2:  98% 100%   Weight:      Height:        NAD ABD soft Sensation intact distally Intact pulses distally Dorsiflexion/Plantar flexion intact Incision: dressing C/D/I Compartment soft   Lab Results  Component Value Date   WBC 14.4 (H) 06/21/2017   HGB 10.0 (L) 06/21/2017   HCT 31.5 (L) 06/21/2017   MCV 89.7 06/21/2017   PLT 170 06/21/2017   BMET    Component Value Date/Time   NA 134 (L) 06/21/2017 0512   NA 132 (L) 02/07/2017 1522   K 3.8 06/21/2017 0512   CL 95 (L) 06/21/2017 0512   CO2 32 06/21/2017 0512   GLUCOSE 134 (H) 06/21/2017 0512   BUN 14 06/21/2017 0512   BUN 4 (L) 02/07/2017 1522   CREATININE 0.43 (L) 06/21/2017 0512   CALCIUM 8.6 (L) 06/21/2017 0512   GFRNONAA >60 06/21/2017 0512   GFRAA >60 06/21/2017 0512     Assessment/Plan: 1 Day Post-Op   Active Problems:   COPD with chronic bronchitis (HCC)   HTN (hypertension)   Osteoporosis   Multiple fractures of thoracic spine, closed (HCC)   Hip fracture (HCC)   Chronic diastolic CHF (congestive heart failure) (HCC)   Closed comminuted intertrochanteric fracture of proximal end of right femur ([place])   Protein-calorie malnutrition, severe   TDWB RLE with walker PO pain control PT/OT DVT ppx: lovenox x30 days, SCDs, TEDs Dispo: SNF placement   Andrea Colglazier, Horald Pollen 06/21/2017, 7:50 AM   Rod Can, MD Cell [phone]

## 2017-06-21 NOTE — Evaluation (Signed)
Physical Therapy Evaluation Patient Details Name: Tiffany Maxwell MRN: [id number] DOB: [birth year] Today's Date: 06/21/2017   History of Present Illness  83 y.o. female admitted on 06/19/17 due to fall with resultant R  intertrochanteric femur fx s/p IM Nail with post op TDWB 30%.  Pt with significant PMH of emphysema, PAC, thoracic spine fxs, MI, HTN, COPD, compression fx of lumbar spine, CHF, L compression hip screw.    Clinical Impression  Pt is mod assist overall for OOB to chair and has difficulty both remembering TDWB 30% and maintaining TDWB 30%.  Pt would benefit from SNF level rehab prior to returning home.   PT to follow acutely for deficits listed below.       Follow Up Recommendations SNF    Equipment Recommendations  None recommended by PT    Recommendations for Other Services   NA    Precautions / Restrictions Precautions Precautions: Fall Restrictions Weight Bearing Restrictions: Yes RLE Weight Bearing: Touchdown weight bearing RLE Partial Weight Bearing Percentage or Pounds: 30      Mobility  Bed Mobility Overal bed mobility: Needs Assistance Bed Mobility: Supine to Sit     Supine to sit: Mod assist;HOB elevated     General bed mobility comments: Mod assist to help progress bil legs to EOB, and support trunk with use of HOB elevated and bed rails for leverage to get to sitting EOB.  Pt needed assist at bed pad to scoot EOB.   Transfers Overall transfer level: Needs assistance   Transfers: Sit to/from Stand;Stand Pivot Transfers Sit to Stand: Mod assist;+2 safety/equipment Stand pivot transfers: +2 safety/equipment;Mod assist       General transfer comment: Mod assist to support trunk during transitions.  Pt with posterior lean and significant difficulty maintaining TWB 30% during transfer.  PT's foot under pt's foot to assess pressure.    Ambulation/Gait             General Gait Details: Pt unable at this time.                     Balance Overall balance assessment: Needs assistance Sitting-balance support: Feet supported;Bilateral upper extremity supported Sitting balance-Leahy Scale: Poor   Postural control: Posterior lean Standing balance support: Bilateral upper extremity supported Standing balance-Leahy Scale: Poor                               Pertinent Vitals/Pain Pain Assessment: Faces Faces Pain Scale: Hurts little more Pain Location: right leg Pain Descriptors / Indicators: Grimacing;Guarding Pain Intervention(s): Limited activity within patient's tolerance;Monitored during session;Repositioned    Home Living Family/patient expects to be discharged to:: Private residence Living Arrangements: Children Available Help at Discharge: Family;Available PRN/intermittently (son work night shift) Type of Home: House Home Access: [place]: One level Home Equipment: Environmental consultant - 2 wheels;Bedside commode;Shower seat      Prior Function Level of Independence: Independent with assistive device(s);Needs assistance   Gait / Transfers Assistance Needed: walks with RW  ADL's / Homemaking Assistance Needed: grandaughter helps with bathing.         Hand Dominance        Extremity/Trunk Assessment   Upper Extremity Assessment Upper Extremity Assessment: Defer to OT evaluation    Lower Extremity Assessment Lower Extremity Assessment: RLE deficits/detail;LLE deficits/detail RLE Deficits / Details: right leg with normal post op pain and weakness. Ankle at least 3/5, strong  right quad set, and 2-/5 hip/knee flexion  as grossly assessed supine in bed.  LLE Deficits / Details: left leg with at least 4/5 strength as grossly assessed during mobility.     Cervical / Trunk Assessment Cervical / Trunk Assessment: Kyphotic  Communication   Communication: HOH  Cognition Arousal/Alertness: Awake/alert Behavior During Therapy: WFL for tasks assessed/performed Overall  Cognitive Status: No family/caregiver present to determine baseline cognitive functioning                                 General Comments: PT seems to perseverate on being paranoid that people are laughing at her or making fun of her.        General Comments General comments (skin integrity, edema, etc.): Per pt report she is on 2 L O2 [place] at home and is on 4.5 L O2 [place] here.  She had O2 on and O2 sats decreased to 85% with DOE 3/4 during mobility.  Pt incontinent of bowel and needed total assist for peri care.     Exercises Total Joint Exercises Ankle Circles/Pumps: AROM;Both;10 reps Quad Sets: AROM;Right;10 reps Heel Slides: AAROM;Right;10 reps Hip ABduction/ADduction: AAROM;Right;10 reps   Assessment/Plan    PT Assessment Patient needs continued PT services  PT Problem List Decreased strength;Decreased range of motion;Decreased activity tolerance;Decreased balance;Decreased mobility;Decreased coordination;Decreased cognition;Decreased knowledge of use of DME;Decreased knowledge of precautions;Pain;Cardiopulmonary status limiting activity       PT Treatment Interventions DME instruction;Gait training;Functional mobility training;Therapeutic activities;Balance training;Therapeutic exercise;Patient/family education;Neuromuscular re-education;Manual techniques;Modalities    PT Goals (Current goals can be found in the Care Plan section)  Acute Rehab PT Goals Patient Stated Goal: to go home PT Goal Formulation: With patient Time For Goal Achievement: 07/05/17 Potential to Achieve Goals: Good    Frequency Min 3X/week           AM-PAC PT "6 Clicks" Daily Activity  Outcome Measure Difficulty turning over in bed (including adjusting bedclothes, sheets and blankets)?: Total Difficulty moving from lying on back to sitting on the side of the bed? : Total Difficulty sitting down on and standing up from a chair with arms (e.g., wheelchair, bedside commode, etc,.)?:  Total Help needed moving to and from a bed to chair (including a wheelchair)?: A Lot Help needed walking in hospital room?: A Lot Help needed climbing 3-5 steps with a railing? : Total 6 Click Score: 8    End of Session Equipment Utilized During Treatment: Gait belt;Oxygen (4.5L O2 [place]) Activity Tolerance: Patient limited by pain;Patient limited by fatigue Patient left: in chair;with call bell/phone within reach;with chair alarm set Nurse Communication: Mobility status;Other (comment) (pt asking for inhaler) PT Visit Diagnosis: History of falling (Z91.81);Muscle weakness (generalized) (M62.81);Difficulty in walking, not elsewhere classified (R26.2);Pain Pain - Right/Left: Right Pain - part of body: Leg    Time: 7591-6384 PT Time Calculation (min) (ACUTE ONLY): 43 min   Charges:         Wells Guiles B. Aymee Fomby, PT, DPT [phone]   PT Evaluation $PT Eval Moderate Complexity: 1 Procedure PT Treatments $Therapeutic Exercise: 8-22 mins $Therapeutic Activity: 8-22 mins   06/21/2017, 11:46 PM

## 2017-06-22 DIAGNOSIS — I5042 Chronic combined systolic (congestive) and diastolic (congestive) heart failure: Secondary | ICD-10-CM

## 2017-06-22 LAB — CBC
HCT: 25.8 % — ABNORMAL LOW (ref 36.0–46.0)
Hemoglobin: 8.2 g/dL — ABNORMAL LOW (ref 12.0–15.0)
MCH: 28.8 pg (ref 26.0–34.0)
MCHC: 31.8 g/dL (ref 30.0–36.0)
MCV: 90.5 fL (ref 78.0–100.0)
PLATELETS: 156 10*3/uL (ref 150–400)
RBC: 2.85 MIL/uL — ABNORMAL LOW (ref 3.87–5.11)
RDW: 12.5 % (ref 11.5–15.5)
WBC: 11.7 10*3/uL — AB (ref 4.0–10.5)

## 2017-06-22 LAB — BASIC METABOLIC PANEL
ANION GAP: 4 — AB (ref 5–15)
BUN: 15 mg/dL (ref 6–20)
CALCIUM: 8.7 mg/dL — AB (ref 8.9–10.3)
CO2: 34 mmol/L — ABNORMAL HIGH (ref 22–32)
Chloride: 91 mmol/L — ABNORMAL LOW (ref 101–111)
Creatinine, Ser: 0.41 mg/dL — ABNORMAL LOW (ref 0.44–1.00)
GFR calc Af Amer: >60 mL/min (ref >60)
GLUCOSE: 104 mg/dL — AB (ref 65–99)
Potassium: 3.9 mmol/L (ref 3.5–5.1)
SODIUM: 129 mmol/L — AB (ref 135–145)

## 2017-06-22 LAB — HEMOGLOBIN AND HEMATOCRIT, BLOOD
HCT: 23.6 % — ABNORMAL LOW (ref 36.0–46.0)
HEMOGLOBIN: 7.6 g/dL — AB (ref 12.0–15.0)

## 2017-06-22 MED ORDER — ORAL CARE MOUTH RINSE
15.0000 mL | Freq: Two times a day (BID) | OROMUCOSAL | Status: DC
  Administered 2017-06-22 – 2017-06-23 (×3): 15 mL via OROMUCOSAL

## 2017-06-22 NOTE — Clinical Social Work Note (Signed)
Clinical Social Work Assessment  Patient Details  Name: Tiffany Maxwell MRN: [id number] Date of Birth: Jun 25, 1934  Date of referral:  06/22/17               Reason for consult:  Facility Placement                Permission sought to share information with:  Facility Sport and exercise psychologist, Family Supports Permission granted to share information::  Yes, Verbal Permission Granted  Name::     Surveyor, quantity::  SNFs  Relationship::  Son  Contact Information:  [phone]  Housing/Transportation Living arrangements for the past 2 months:  [place] of Information:  Patient, Adult Children Patient Interpreter Needed:  None Criminal Activity/Legal Involvement Pertinent to Current Situation/Hospitalization:  No - Comment as needed Significant Relationships:  Adult Children Lives with:  Self Do you feel safe going back to the place where you live?  No Need for family participation in patient care:  Yes (Comment)  Care giving concerns:  CSW received consult for possible SNF placement at time of discharge. CSW spoke with patient regarding PT recommendation of SNF placement at time of discharge. Patient expressed understanding of PT recommendation and is agreeable to SNF placement at time of discharge. She could not remember where she had done rehab before and asked if CSW would call her son. Her family is not able to help her at home. CSW to continue to follow and assist with discharge planning needs.   Social Worker assessment / plan:  CSW spoke with patient and her son concerning possibility of rehab at [facility] before returning home.  Employment status:  Retired Forensic scientist:  Information systems manager, Medicaid In [place] PT Recommendations:  [place] / Referral to community resources:  [place]  Patient/Family's Response to care:  Patient and her son recognize need for rehab before returning home and are agreeable to a SNF in [place]. Patient's son reported preference for [facility].  Patient/Family's Understanding of and Emotional Response to Diagnosis, Current Treatment, and Prognosis:  Patient/family is realistic regarding therapy needs and expressed being hopeful for SNF placement. Patient expressed understanding of CSW role and discharge process and her medical condition. No questions/concerns about plan or treatment.    Emotional Assessment Appearance:  Appears stated age Attitude/Demeanor/Rapport:  Other (Appropriate but confused) Affect (typically observed):  Accepting, Appropriate Orientation:  Oriented to Self, Oriented to Situation, Oriented to Place, Oriented to  Time (Oriented as documented on chart, but patient is HOH and didn't know where she was at) Alcohol / Substance use:  Not Applicable Psych involvement (Current and /or in the community):  No (Comment)  Discharge Needs  Concerns to be addressed:  Care Coordination Readmission within the last 30 days:  No Current discharge risk:  None Barriers to Discharge:  Continued Medical Work up   [employer], [place] 06/22/2017, 11:19 AM

## 2017-06-22 NOTE — [place] FL2 (Signed)
[name] MEDICAID FL2 LEVEL OF CARE SCREENING TOOL     IDENTIFICATION  Patient Name: Tiffany Maxwell Birthdate: 1934/09/08 Sex: female Admission Date (Current Location): 06/19/2017  [facility] and [place] Number:  [employer] and Address:  [facility] [place], [place] [address] [place], [place] [address]      Provider Number: [id number]  Attending Physician Name and Address:  Tiffany Aloe, MD  Relative Name and Phone Number:  Tiffany Maxwell, son, [phone]    Current Level of Care: Hospital Recommended Level of Care: [place] Prior Approval Number:    Date Approved/Denied:   PASRR Number: [id number]  Discharge Plan: SNF    Current Diagnoses: Patient Active Problem List   Diagnosis Date Noted  . Closed comminuted intertrochanteric fracture of proximal end of right femur ([place]) 06/21/2017  . Protein-calorie malnutrition, severe 06/21/2017  . Hip fracture ([place]) 06/20/2017  . Chronic combined systolic and diastolic CHF (congestive heart failure) ([place]) 06/20/2017  . GAD (generalized anxiety disorder) 11/06/2016  . Back pain 04/08/2014  . Multiple fractures of thoracic spine, closed ([place]) 01/30/2013  . Pulmonary emphysema ([place]) 01/30/2013  . History of tobacco use 01/30/2013  . Diastolic dysfunction 02/77/4128  . Hypokalemia 01/29/2013  . Osteoporosis 01/29/2013  . COPD with chronic bronchitis ([place]) 01/28/2013  . Compression fracture 01/28/2013  . HTN (hypertension) 01/28/2013    Orientation RESPIRATION BLADDER Height & Weight     Self, Situation, Time, Place  O2 (Nasal cannula 4L) Incontinent Weight: 45.4 kg (100 lb) Height:  5\' 4"  (162.6 cm)  BEHAVIORAL SYMPTOMS/MOOD NEUROLOGICAL BOWEL NUTRITION STATUS      Continent Diet (Please see DC Summary)  AMBULATORY STATUS COMMUNICATION OF NEEDS Skin   Limited Assist Verbally Surgical wounds (Closed incision on hip)                       Personal Care Assistance Level of  Assistance  Bathing, Feeding, Dressing Bathing Assistance: Limited assistance Feeding assistance: Independent Dressing Assistance: Limited assistance     Functional Limitations Info             SPECIAL CARE FACTORS FREQUENCY  PT (By licensed PT)     PT Frequency: 5x/week              Contractures      Additional Factors Info  Code Status, Allergies Code Status Info: Full Allergies Info: Codeine, Benicar Hct Olmesartan Medoxomil-hctz, Naproxen           Current Medications (06/22/2017):  This is the current hospital active medication list Current Facility-Administered Medications  Medication Dose Route Frequency Provider Last Rate Last Dose  . 0.45 % sodium chloride infusion   Intravenous Continuous Tiffany Aloe, MD 100 mL/hr at 06/21/17 2312    . acetaminophen (TYLENOL) tablet 650 mg  650 mg Oral Q6H PRN Maxwell, Tiffany Edelman, MD       Or  . acetaminophen (TYLENOL) suppository 650 mg  650 mg Rectal Q6H PRN Maxwell, Tiffany Edelman, MD      . albuterol (PROVENTIL) (2.5 MG/3ML) 0.083% nebulizer solution 2.5 mg  2.5 mg Nebulization Q6H PRN Tiffany Ford, DO   2.5 mg at 06/22/17 0329  . benzonatate (TESSALON) capsule 100 mg  100 mg Oral BID PRN Tiffany Ford, DO      . calcium-vitamin D (OSCAL WITH D) 500-200 MG-UNIT per tablet 1 tablet  1 tablet Oral Daily Tiffany Ford, DO   1 tablet at 06/21/17 1033  . Chlorhexidine  Gluconate Cloth 2 % PADS 6 each  6 each Topical Daily Tiffany Aloe, MD   6 each at 06/21/17 1035  . citalopram (CELEXA) tablet 40 mg  40 mg Oral Daily Tiffany Ford, DO   40 mg at 06/21/17 1033  . enoxaparin (LOVENOX) injection 30 mg  30 mg Subcutaneous Q24H Maxwell, Tiffany P, RPH   30 mg at 06/21/17 1423  . feeding supplement (ENSURE ENLIVE) (ENSURE ENLIVE) liquid 237 mL  237 mL Oral BID BM Tiffany Aloe, MD   237 mL at 06/21/17 1422  . HYDROcodone-acetaminophen (NORCO/VICODIN) 5-325 MG per tablet 1-2 tablet  1-2 tablet Oral Q6H PRN Tiffany Ford, DO    1 tablet at 06/22/17 0553  . ipratropium-albuterol (DUONEB) 0.5-2.5 (3) MG/3ML nebulizer solution 3 mL  3 mL Nebulization Q6H PRN Tiffany Ford, DO      . lactated ringers infusion   Intravenous Continuous Tiffany Palau, MD 10 mL/hr at 06/20/17 1646    . lisinopril (PRINIVIL,ZESTRIL) tablet 40 mg  40 mg Oral Daily Tiffany Ford, DO   40 mg at 06/21/17 1033  . MEDLINE mouth rinse  15 mL Mouth Rinse BID Tiffany Aloe, MD      . menthol-cetylpyridinium (CEPACOL) lozenge 3 mg  1 lozenge Oral PRN Maxwell, Tiffany Edelman, MD       Or  . phenol (CHLORASEPTIC) mouth spray 1 spray  1 spray Mouth/Throat PRN Maxwell, Tiffany Edelman, MD      . methocarbamol (ROBAXIN) tablet 500 mg  500 mg Oral Q6H PRN Tiffany Ford, DO       Or  . methocarbamol (ROBAXIN) 500 mg in dextrose 5 % 50 mL IVPB  500 mg Intravenous Q6H PRN Tiffany Ford, DO      . metoCLOPramide (REGLAN) tablet 5-10 mg  5-10 mg Oral Q8H PRN Maxwell, Tiffany Edelman, MD       Or  . metoCLOPramide (REGLAN) injection 5-10 mg  5-10 mg Intravenous Q8H PRN Maxwell, Tiffany Edelman, MD      . morphine 2 MG/ML injection 0.5 mg  0.5 mg Intravenous Q2H PRN Tiffany Ford, DO      . multivitamin with minerals tablet 1 tablet  1 tablet Oral Daily Tiffany Aloe, MD   1 tablet at 06/21/17 1033  . mupirocin ointment (BACTROBAN) 2 % 1 application  1 application Nasal BID Tiffany Aloe, MD   1 application at 51/76/16 2122  . ondansetron (ZOFRAN) tablet 4 mg  4 mg Oral Q6H PRN Maxwell, Tiffany Edelman, MD       Or  . ondansetron (ZOFRAN) injection 4 mg  4 mg Intravenous Q6H PRN Maxwell, Tiffany Edelman, MD      . polyethylene glycol (MIRALAX / GLYCOLAX) packet 17 g  17 g Oral Daily PRN Tiffany Ford, DO         Discharge Medications: Please see discharge summary for a list of discharge medications.  Relevant Imaging Results:  Relevant Lab Results:   Additional Information SSN: [place] [address] [place], [place]

## 2017-06-22 NOTE — Progress Notes (Signed)
Patient's son would like for patient to discharge to [facility]. Facility aware and able to accept patient.   Percell Locus Leasa Kincannon LCSWA [phone]

## 2017-06-22 NOTE — Progress Notes (Signed)
Physical Therapy Treatment Patient Details Name: Tiffany Maxwell MRN: [id number] DOB: 11-Aug-1934 Today's Date: 06/22/2017    History of Present Illness 83 y.o. female admitted on 06/19/17 due to fall with resultant R  intertrochanteric femur fx s/p IM Nail with post op TDWB 30%.  Pt with significant PMH of emphysema, PAC, thoracic spine fxs, MI, HTN, COPD, compression fx of lumbar spine, CHF, L compression hip screw.      PT Comments    Pt progressing toward goals. Mod assist +2 overall. Pt on 4.5L O2, with O2 saturation at 85% after treatment. Pt would benefit from continued care to further progress toward goals and d/c to SNF.   Follow Up Recommendations  SNF     Equipment Recommendations  None recommended by PT    Recommendations for Other Services       Precautions / Restrictions Precautions Precautions: Fall Restrictions Weight Bearing Restrictions: Yes RLE Weight Bearing: Touchdown weight bearing RLE Partial Weight Bearing Percentage or Pounds: 30    Mobility  Bed Mobility Overal bed mobility: Needs Assistance Bed Mobility: Supine to Sit     Supine to sit: Mod assist;HOB elevated     General bed mobility comments: Mod assist to help progress bil legs to EOB, and support trunk with use of HOB elevated and bed rails for leverage to get to sitting EOB.  Pt needed assist at bed pad to scoot EOB.   Transfers Overall transfer level: Needs assistance   Transfers: Sit to/from Stand;Stand Pivot Transfers Sit to Stand: Mod assist;+2 safety/equipment Stand pivot transfers: Mod assist;+2 safety/equipment       General transfer comment: Mod assist to support trunk during transitions.  Pt with posterior lean and significant difficulty maintaining TWB 30% during transfer.  PT's foot under pt's foot to assess pressure.    Ambulation/Gait             General Gait Details: Pt unable at this time.    Stairs            Wheelchair Mobility    Modified Rankin  (Stroke Patients Only)       Balance Overall balance assessment: Needs assistance Sitting-balance support: Feet supported;Bilateral upper extremity supported Sitting balance-Leahy Scale: Poor   Postural control: Posterior lean Standing balance support: Bilateral upper extremity supported Standing balance-Leahy Scale: Poor                              Cognition Arousal/Alertness: Awake/alert Behavior During Therapy: Anxious Overall Cognitive Status: Impaired/Different from baseline Area of Impairment: Memory;Problem solving;Safety/judgement                     Memory: Decreased short-term memory   Safety/Judgement: Decreased awareness of safety   Problem Solving: Slow processing;Requires verbal cues;Requires tactile cues General Comments: Pt anxious about falling, being left alone in the room, and being moved to SNF.  Pt's short-term memory impaired this treatment.      Exercises Total Joint Exercises Ankle Circles/Pumps: AROM;Both;20 reps;Supine Quad Sets: AAROM;Right;10 reps;Supine (5" holds) Heel Slides: AAROM;Right;10 reps;Supine Hip ABduction/ADduction: AAROM;Right;10 reps;Supine    General Comments General comments (skin integrity, edema, etc.): Pt anxiety about falling, being left alone, and being moved to SNF seemed to affect breathing. O2 dropped to 85% after stand pivot transfer from bed to chair.      Pertinent Vitals/Pain Pain Assessment: 0-10 Pain Score: 8  Pain Location: right leg Pain Intervention(s): Repositioned;Monitored during session;Limited  activity within patient's tolerance;RN gave pain meds during session    Home Living                      Prior Function            PT Goals (current goals can now be found in the care plan section) Acute Rehab PT Goals PT Goal Formulation: With patient Time For Goal Achievement: 07/05/17 Potential to Achieve Goals: Good    Frequency    Min 3X/week      PT Plan       Co-evaluation              AM-PAC PT "6 Clicks" Daily Activity  Outcome Measure  Difficulty turning over in bed (including adjusting bedclothes, sheets and blankets)?: Total Difficulty moving from lying on back to sitting on the side of the bed? : Total Difficulty sitting down on and standing up from a chair with arms (e.g., wheelchair, bedside commode, etc,.)?: Total Help needed moving to and from a bed to chair (including a wheelchair)?: A Lot Help needed walking in hospital room?: A Lot Help needed climbing 3-5 steps with a railing? : Total 6 Click Score: 8    End of Session Equipment Utilized During Treatment: Gait belt;Oxygen Activity Tolerance: Patient limited by pain;Patient limited by fatigue Patient left: in chair;with chair alarm set;with call bell/phone within reach   PT Visit Diagnosis: History of falling (Z91.81);Muscle weakness (generalized) (M62.81);Difficulty in walking, not elsewhere classified (R26.2);Pain Pain - Right/Left: Right Pain - part of body: Leg     Time: 1640-1710 PT Time Calculation (min) (ACUTE ONLY): 30 min  Charges:                       G Codes:       Rayford Halsted, [place] Office [phone]    Rayford Halsted 06/22/2017, 5:48 PM

## 2017-06-22 NOTE — Progress Notes (Signed)
Patient will qualify for SNF through Medicare on discharge tomorrow, 7/14 (requires 3 night inpatient stay).  Tiffany Maxwell Red LCSWA [phone]

## 2017-06-22 NOTE — Progress Notes (Signed)
PROGRESS NOTE    Tiffany Maxwell  CZY:[id number] DOB: 07/16/1934 DOA: 06/19/2017 PCP: [name], FNP   Brief Narrative: Tiffany Maxwell is a 83 y.o. remote history of diastolic heart failure, COPD on home oxygen, multiple fractures of the spine, osteoporosis, depression. Patient presented after having a fall landing on her right hip and fracturing it. Patient underwent internal fixation on 7/11. PT and OT to evaluate for likely SNF placement.   Assessment & Plan:   Active Problems:   COPD with chronic bronchitis (HCC)   HTN (hypertension)   Osteoporosis   Multiple fractures of thoracic spine, closed (HCC)   Hip fracture (HCC)   Chronic combined systolic and diastolic CHF (congestive heart failure) (HCC)   Closed comminuted intertrochanteric fracture of proximal end of right femur ([place])   Protein-calorie malnutrition, severe   Right hip fracture S/p internal fixation on 7/11 -PT/OT: SNF -CSW for SNF placement -orthopedic surgery recommendations: lovenox x30 days, TED, SCDs, touchdown weight bearing of right LE with walker  Chronic combined systolic and diastolic heart failure Euvolemic. EF 40% with septal, inferior/anterior apical hypokinesis with LVH and grade 1 diastolic dysfunction  Essential hypertension -Continue lisinopril  Osteoporosis History of multiple fractures -continue calcium -continue vitamin D  Depression -continue Celexa  COPD Stable. -continue albuterol prn  History of CAD -continue lisinopril -restart aspirin  Anemia Blood loss from surgery. Expected. Decreased since yesterday. No evidence of continued bleeding. No increased pain. Doubt hematoma. -repeat hemoglobin   DVT prophylaxis: Lovenox Code Status: Full code Family Communication: None at bedside Disposition Plan: Discharge to SNF when bed available   Consultants:   Orthopedic surgery  Procedures:   Internal fixation of right hip  (7/11)  Antimicrobials:  None    Subjective: Pain managed with Norco.  Objective: Vitals:   06/21/17 1500 06/21/17 1707 06/21/17 2238 06/22/17 0611  BP: (!) 96/59  123/69 125/62  Pulse: (!) 109  91 90  Resp:   20 20  Temp:   97.8 F (36.6 C) 98.2 F (36.8 C)  TempSrc:      SpO2: 93% (!) 87% 100% 95%  Weight:      Height:        Intake/Output Summary (Last 24 hours) at 06/22/17 1055 Last data filed at 06/22/17 0732  Gross per 24 hour  Intake          2701.34 ml  Output             1000 ml  Net          1701.34 ml   Filed Weights   06/19/17 2230 06/20/17 0544  Weight: 50.8 kg (112 lb) 45.4 kg (100 lb)    Examination:  General exam: Appears calm and comfortable  Respiratory system: Clear to auscultation. Respiratory effort normal. Cardiovascular system: S1 & S2 heard, RRR. No murmurs, . Gastrointestinal system: Abdomen is nondistended, soft and nontender. Normal bowel sounds heard. Central nervous system: Alert and oriented to person and place. Hearing loss. Appears to have cognitive impairment. Extremities: No edema. No calf tenderness Skin: No cyanosis. No rashes Psychiatry: Mood & affect depressed and flat.     Data Reviewed: I have personally reviewed following labs and imaging studies  CBC:  Recent Labs Lab 06/19/17 2354 06/20/17 0708 06/21/17 0512 06/22/17 0331  WBC 9.6 10.4 14.4* 11.7*  NEUTROABS 8.0*  --   --   --   HGB 12.0 11.8* 10.0* 8.2*  HCT 37.3 37.6 31.5* 25.8*  MCV 90.5 91.9 89.7  90.5  PLT 232 210 170 678   Basic Metabolic Panel:  Recent Labs Lab 06/19/17 2354 06/20/17 0708 06/21/17 0512 06/22/17 0331  NA 135 134* 134* 129*  K 4.1 3.8 3.8 3.9  CL 91* 92* 95* 91*  CO2 31 36* 32 34*  GLUCOSE 141* 136* 134* 104*  BUN 13 12 14 15   CREATININE 0.36* 0.41* 0.43* 0.41*  CALCIUM 9.8 9.6 8.6* 8.7*   GFR: Estimated Creatinine Clearance: 39.5 mL/min (A) (by C-G formula based on SCr of 0.41 mg/dL (L)). Liver Function Tests: No  results for input(s): AST, ALT, ALKPHOS, BILITOT, PROT, ALBUMIN in the last 168 hours. No results for input(s): LIPASE, AMYLASE in the last 168 hours. No results for input(s): AMMONIA in the last 168 hours. Coagulation Profile: No results for input(s): INR, PROTIME in the last 168 hours. Cardiac Enzymes: No results for input(s): CKTOTAL, CKMB, CKMBINDEX, TROPONINI in the last 168 hours. BNP (last 3 results) No results for input(s): PROBNP in the last 8760 hours. HbA1C: No results for input(s): HGBA1C in the last 72 hours. CBG: No results for input(s): GLUCAP in the last 168 hours. Lipid Profile: No results for input(s): CHOL, HDL, LDLCALC, TRIG, CHOLHDL, LDLDIRECT in the last 72 hours. Thyroid Function Tests: No results for input(s): TSH, T4TOTAL, FREET4, T3FREE, THYROIDAB in the last 72 hours. Anemia Panel: No results for input(s): VITAMINB12, FOLATE, FERRITIN, TIBC, IRON, RETICCTPCT in the last 72 hours. Sepsis Labs: No results for input(s): PROCALCITON, LATICACIDVEN in the last 168 hours.  Recent Results (from the past 240 hour(s))  Surgical PCR screen     Status: Abnormal   Collection Time: 06/20/17 10:49 AM  Result Value Ref Range Status   MRSA, PCR NEGATIVE NEGATIVE Final   Staphylococcus aureus POSITIVE (A) NEGATIVE Final    Comment:        The Xpert SA Assay (FDA approved for NASAL specimens in patients over 49 years of age), is one component of a comprehensive surveillance program.  Test performance has been validated by [facility] for patients greater than or equal to 37 year old. It is not intended to diagnose infection nor to guide or monitor treatment.          Radiology Studies: Pelvis Portable  Result Date: 06/20/2017 CLINICAL DATA:  83 y/o  F; postop right hip nail placement. EXAM: PORTABLE PELVIS 1-2 VIEWS COMPARISON:  06/20/2017 right hip radiographs. FINDINGS: Right proximal femur intramedullary nail fixing an intertrochanteric fracture with  proximal screw traversing femoral neck and distal interlocking threaded screw. Air and edema in the soft tissues are compatible postsurgical changes. Lesser trochanter fracture with increased medial avulsion in comparison with prior radiographs. No new acute fracture identified. Left proximal femur hardware without apparent hardware related complication. IMPRESSION: 1. Right femur intertrochanteric fracture post intramedullary nail fixation with expected postsurgical changes. 2. Right lesser trochanter fracture with interval increase medial displacement. Electronically Signed   By: Kristine Garbe M.D.   On: 06/20/2017 22:57   Dg C-arm 1-60 Min  Result Date: 06/20/2017 CLINICAL DATA:  Elective surgery. EXAM: RIGHT FEMUR 2 VIEWS; DG C-ARM 61-120 MIN COMPARISON:  Preoperative radiographs earlier this day. FINDINGS: Three fluoroscopic spot images in the operating room demonstrate intramedullary nail with trans trochanteric lag screw fixating intertrochanteric right femur fracture. Fluoroscopy time 1 minutes 6 seconds. IMPRESSION: Procedural fluoroscopy post intramedullary nail and lag screw fixating intertrochanteric femur fracture. Electronically Signed   By: Jeb Levering M.D.   On: 06/20/2017 22:46   Dg Femur, Min  2 Views Right  Result Date: 06/20/2017 CLINICAL DATA:  Elective surgery. EXAM: RIGHT FEMUR 2 VIEWS; DG C-ARM 61-120 MIN COMPARISON:  Preoperative radiographs earlier this day. FINDINGS: Three fluoroscopic spot images in the operating room demonstrate intramedullary nail with trans trochanteric lag screw fixating intertrochanteric right femur fracture. Fluoroscopy time 1 minutes 6 seconds. IMPRESSION: Procedural fluoroscopy post intramedullary nail and lag screw fixating intertrochanteric femur fracture. Electronically Signed   By: Jeb Levering M.D.   On: 06/20/2017 22:46        Scheduled Meds: . calcium-vitamin D  1 tablet Oral Daily  . Chlorhexidine Gluconate Cloth  6 each  Topical Daily  . citalopram  40 mg Oral Daily  . enoxaparin (LOVENOX) injection  30 mg Subcutaneous Q24H  . feeding supplement (ENSURE ENLIVE)  237 mL Oral BID BM  . lisinopril  40 mg Oral Daily  . mouth rinse  15 mL Mouth Rinse BID  . multivitamin with minerals  1 tablet Oral Daily  . mupirocin ointment  1 application Nasal BID   Continuous Infusions: . sodium chloride 100 mL/hr at 06/21/17 2312  . lactated ringers 10 mL/hr at 06/20/17 1646  . methocarbamol (ROBAXIN)  IV       LOS: 2 days     Cordelia Poche, MD [facility] 06/22/2017, 10:55 AM Pager: [phone]  If 7PM-7AM, please contact night-coverage [email or web address] Password [facility] 06/22/2017, 10:55 AM

## 2017-06-23 DIAGNOSIS — I110 Hypertensive heart disease with heart failure: Secondary | ICD-10-CM | POA: Diagnosis present

## 2017-06-23 DIAGNOSIS — I959 Hypotension, unspecified: Secondary | ICD-10-CM | POA: Diagnosis present

## 2017-06-23 DIAGNOSIS — K449 Diaphragmatic hernia without obstruction or gangrene: Secondary | ICD-10-CM | POA: Diagnosis present

## 2017-06-23 DIAGNOSIS — E43 Unspecified severe protein-calorie malnutrition: Secondary | ICD-10-CM | POA: Diagnosis not present

## 2017-06-23 DIAGNOSIS — I10 Essential (primary) hypertension: Secondary | ICD-10-CM | POA: Diagnosis not present

## 2017-06-23 DIAGNOSIS — Z4789 Encounter for other orthopedic aftercare: Secondary | ICD-10-CM | POA: Diagnosis not present

## 2017-06-23 DIAGNOSIS — M810 Age-related osteoporosis without current pathological fracture: Secondary | ICD-10-CM | POA: Diagnosis present

## 2017-06-23 DIAGNOSIS — Z79899 Other long term (current) drug therapy: Secondary | ICD-10-CM | POA: Diagnosis not present

## 2017-06-23 DIAGNOSIS — Z7982 Long term (current) use of aspirin: Secondary | ICD-10-CM | POA: Diagnosis not present

## 2017-06-23 DIAGNOSIS — Z886 Allergy status to analgesic agent status: Secondary | ICD-10-CM | POA: Diagnosis not present

## 2017-06-23 DIAGNOSIS — Z66 Do not resuscitate: Secondary | ICD-10-CM | POA: Diagnosis present

## 2017-06-23 DIAGNOSIS — M8000XS Age-related osteoporosis with current pathological fracture, unspecified site, sequela: Secondary | ICD-10-CM | POA: Diagnosis not present

## 2017-06-23 DIAGNOSIS — J440 Chronic obstructive pulmonary disease with acute lower respiratory infection: Secondary | ICD-10-CM | POA: Diagnosis not present

## 2017-06-23 DIAGNOSIS — K922 Gastrointestinal hemorrhage, unspecified: Secondary | ICD-10-CM | POA: Diagnosis not present

## 2017-06-23 DIAGNOSIS — M6281 Muscle weakness (generalized): Secondary | ICD-10-CM | POA: Diagnosis not present

## 2017-06-23 DIAGNOSIS — D649 Anemia, unspecified: Secondary | ICD-10-CM | POA: Diagnosis not present

## 2017-06-23 DIAGNOSIS — S72141D Displaced intertrochanteric fracture of right femur, subsequent encounter for closed fracture with routine healing: Secondary | ICD-10-CM | POA: Diagnosis not present

## 2017-06-23 DIAGNOSIS — S72001D Fracture of unspecified part of neck of right femur, subsequent encounter for closed fracture with routine healing: Secondary | ICD-10-CM | POA: Diagnosis not present

## 2017-06-23 DIAGNOSIS — I252 Old myocardial infarction: Secondary | ICD-10-CM | POA: Diagnosis not present

## 2017-06-23 DIAGNOSIS — J449 Chronic obstructive pulmonary disease, unspecified: Secondary | ICD-10-CM | POA: Diagnosis not present

## 2017-06-23 DIAGNOSIS — Z681 Body mass index (BMI) 19 or less, adult: Secondary | ICD-10-CM | POA: Diagnosis not present

## 2017-06-23 DIAGNOSIS — F329 Major depressive disorder, single episode, unspecified: Secondary | ICD-10-CM | POA: Diagnosis not present

## 2017-06-23 DIAGNOSIS — I5042 Chronic combined systolic (congestive) and diastolic (congestive) heart failure: Secondary | ICD-10-CM | POA: Diagnosis not present

## 2017-06-23 DIAGNOSIS — Z8781 Personal history of (healed) traumatic fracture: Secondary | ICD-10-CM | POA: Diagnosis not present

## 2017-06-23 DIAGNOSIS — D62 Acute posthemorrhagic anemia: Secondary | ICD-10-CM | POA: Diagnosis not present

## 2017-06-23 DIAGNOSIS — K264 Chronic or unspecified duodenal ulcer with hemorrhage: Secondary | ICD-10-CM | POA: Diagnosis present

## 2017-06-23 DIAGNOSIS — S72001A Fracture of unspecified part of neck of right femur, initial encounter for closed fracture: Secondary | ICD-10-CM | POA: Diagnosis not present

## 2017-06-23 DIAGNOSIS — K269 Duodenal ulcer, unspecified as acute or chronic, without hemorrhage or perforation: Secondary | ICD-10-CM | POA: Diagnosis not present

## 2017-06-23 DIAGNOSIS — F419 Anxiety disorder, unspecified: Secondary | ICD-10-CM | POA: Diagnosis not present

## 2017-06-23 DIAGNOSIS — S22000D Wedge compression fracture of unspecified thoracic vertebra, subsequent encounter for fracture with routine healing: Secondary | ICD-10-CM | POA: Diagnosis not present

## 2017-06-23 DIAGNOSIS — J9611 Chronic respiratory failure with hypoxia: Secondary | ICD-10-CM | POA: Diagnosis present

## 2017-06-23 DIAGNOSIS — Z8249 Family history of ischemic heart disease and other diseases of the circulatory system: Secondary | ICD-10-CM | POA: Diagnosis not present

## 2017-06-23 DIAGNOSIS — R4182 Altered mental status, unspecified: Secondary | ICD-10-CM | POA: Diagnosis not present

## 2017-06-23 DIAGNOSIS — E871 Hypo-osmolality and hyponatremia: Secondary | ICD-10-CM | POA: Diagnosis not present

## 2017-06-23 DIAGNOSIS — K921 Melena: Secondary | ICD-10-CM | POA: Diagnosis present

## 2017-06-23 DIAGNOSIS — E785 Hyperlipidemia, unspecified: Secondary | ICD-10-CM | POA: Diagnosis not present

## 2017-06-23 DIAGNOSIS — Z87891 Personal history of nicotine dependence: Secondary | ICD-10-CM | POA: Diagnosis not present

## 2017-06-23 DIAGNOSIS — R1311 Dysphagia, oral phase: Secondary | ICD-10-CM | POA: Diagnosis not present

## 2017-06-23 DIAGNOSIS — K222 Esophageal obstruction: Secondary | ICD-10-CM | POA: Diagnosis present

## 2017-06-23 DIAGNOSIS — S92153A Displaced avulsion fracture (chip fracture) of unspecified talus, initial encounter for closed fracture: Secondary | ICD-10-CM | POA: Diagnosis not present

## 2017-06-23 DIAGNOSIS — R41841 Cognitive communication deficit: Secondary | ICD-10-CM | POA: Diagnosis not present

## 2017-06-23 DIAGNOSIS — F411 Generalized anxiety disorder: Secondary | ICD-10-CM | POA: Diagnosis present

## 2017-06-23 DIAGNOSIS — X58XXXD Exposure to other specified factors, subsequent encounter: Secondary | ICD-10-CM | POA: Diagnosis present

## 2017-06-23 LAB — CBC
HCT: 23.4 % — ABNORMAL LOW (ref 36.0–46.0)
Hemoglobin: 7.5 g/dL — ABNORMAL LOW (ref 12.0–15.0)
MCH: 28.7 pg (ref 26.0–34.0)
MCHC: 32.1 g/dL (ref 30.0–36.0)
MCV: 89.7 fL (ref 78.0–100.0)
PLATELETS: 160 10*3/uL (ref 150–400)
RBC: 2.61 MIL/uL — AB (ref 3.87–5.11)
RDW: 12.4 % (ref 11.5–15.5)
WBC: 9.2 10*3/uL (ref 4.0–10.5)

## 2017-06-23 LAB — HEMOGLOBIN AND HEMATOCRIT, BLOOD
HCT: 23.6 % — ABNORMAL LOW (ref 36.0–46.0)
Hemoglobin: 7.5 g/dL — ABNORMAL LOW (ref 12.0–15.0)

## 2017-06-23 MED ORDER — CITALOPRAM HYDROBROMIDE 20 MG PO TABS: 20.0000 mg | ORAL_TABLET | Freq: Every day | ORAL | Status: DC

## 2017-06-23 MED ORDER — ENSURE ENLIVE PO LIQD: 237.0000 mL | Freq: Two times a day (BID) | ORAL | Status: DC

## 2017-06-23 MED ORDER — FERROUS SULFATE 325 (65 FE) MG PO TABS: 325.0000 mg | ORAL_TABLET | Freq: Two times a day (BID) | ORAL | Status: DC

## 2017-06-23 NOTE — Discharge Summary (Signed)
Physician Discharge Summary  Tiffany Maxwell IFO:[id number] DOB: May 08, 1934 DOA: 06/19/2017  PCP: [name], FNP  Admit date: 06/19/2017 Discharge date: 06/23/2017  Admitted From: Home Disposition: SNF  Recommendations for Outpatient Follow-up:  1. Follow up with PCP in 1 week 2. Repeat CBC to check hemoglobin 3. Iron for anemia from blood loss 4. DVT prophylaxis with Lovenox and aspirin 5. Touchdown weight bearing of right lower extremity with walker 6. Recheck BMP to follow-up sodium 7. Celexa titrated down to 20mg  daily secondary to age 83. Follow up with orthopedic surgery  Home Health: SNF Equipment/Devices: SNF  Discharge Condition: Stable CODE STATUS: Full code Diet recommendation: Heart healthy   Brief/Interim Summary:  Admission HPI written by Cristal Ford, DO   Chief Complaint: Right hip pain  HPI: Tiffany Maxwell is a 83 y.o. female with a medical history of diastolic heart failure, COPD on home oxygen, multiple fractures of the spine, presented with right hip pain. Patient was trying to sit on the commode earlier this evening, instead landed on her right buttock and hip between the commode and sink. Currently denies any chest pain, shortness breath, abdominal pain, nausea or vomiting, diarrhea constipation, dizziness or headache, changes in bowel or urinary pattern, recent illness or travel. Patient uses a walker for ambulation.   ED Course: Found to have hip fracture on xray. [facility] called for admission   Hospital course:  Right hip fracture S/p internal fixation on 7/11. Physical therapy recommending SNF. Lovenox x30 days, TED hose, touchdown weight bearing of right lower extremity with walker.  Chronic combined systolic and diastolic heart failure Euvolemic. EF 40% with septal, inferior/anterior apical hypokinesis with LVH and grade 1 diastolic dysfunction  Essential hypertension Continued lisinopril  Chronic respiratory failure with  hypoxia Continue home oxygen  Osteoporosis History of multiple fractures. Continued calcium and vitamin D  Depression Cognitive impairement Continued Celexa. Discharged on 20mg  dose. Recommend outpatient neurology follow-up.  Hyponatremia Asymptomatic. Likely iatrogenic from hypotonic fluids. Repeat metabolic panel as an outpatient in 2-3 days  COPD Stable. Continued nebulizers.  History of CAD Continued lisinopril and aspirin  Anemia Blood loss from surgery. Expected. No evidence of bleeding. No hematoma appreciated on exam. Hemoglobin stable prior to discharge. Started on iron supplementation.  Discharge Diagnoses:  Active Problems:   COPD with chronic bronchitis (HCC)   HTN (hypertension)   Osteoporosis   Multiple fractures of thoracic spine, closed (HCC)   Hip fracture (HCC)   Chronic combined systolic and diastolic CHF (congestive heart failure) (HCC)   Closed comminuted intertrochanteric fracture of proximal end of right femur ([place])   Protein-calorie malnutrition, severe    Discharge Instructions   Allergies as of 06/23/2017      Reactions   Codeine Other (See Comments)   headache   Benicar Hct [olmesartan Medoxomil-hctz] Swelling, Rash   Naproxen Itching, Rash, Other (See Comments)   Redness/flushing of skin      Medication List    STOP taking these medications   furosemide 20 MG tablet Commonly known as:  LASIX   potassium chloride SA 20 MEQ tablet Commonly known as:  K-DUR,KLOR-CON     TAKE these medications   aspirin EC 81 MG tablet Take 81 mg by mouth daily.   benzonatate 100 MG capsule Commonly known as:  TESSALON TAKE 1 CAPSULE TWICE DAILY AS NEEDED FOR COUGH   Calcium Carbonate-Vitamin D 600-400 MG-UNIT tablet Commonly known as:  CALCIUM 600+D3 Take 1 tablet by mouth 2 (two) times daily. What changed:  when to take this   citalopram 20 MG tablet Commonly known as:  CELEXA Take 1 tablet (20 mg total) by mouth daily. What  changed:  medication strength  how much to take   enoxaparin 40 MG/0.4ML injection Commonly known as:  LOVENOX Inject 0.4 mLs (40 mg total) into the skin daily.   feeding supplement (ENSURE ENLIVE) Liqd Take 237 mLs by mouth 2 (two) times daily between meals.   ferrous sulfate 325 (65 FE) MG tablet Take 1 tablet (325 mg total) by mouth 2 (two) times daily with a meal.   HYDROcodone-acetaminophen 5-325 MG tablet Commonly known as:  NORCO/VICODIN Take 1-2 tablets by mouth every 6 (six) hours as needed for moderate pain.   Ipratropium-Albuterol 20-100 MCG/ACT Aers respimat Commonly known as:  COMBIVENT RESPIMAT Inhale 1 puff into the lungs every 6 (six) hours as needed for wheezing.   lisinopril 40 MG tablet Commonly known as:  PRINIVIL,ZESTRIL Take 1 tablet (40 mg total) by mouth daily.   polyethylene glycol powder powder Commonly known as:  GLYCOLAX/MIRALAX 17 GRAMS (1 CAPFUL) ONCE A DAY AS DIRECTED   VENTOLIN HFA 108 (90 Base) MCG/ACT inhaler Generic drug:  albuterol 2 PUFFS EVERY 6 HOURS AS NEEDED FOR WHEEZING OR SHORTNESS OF BREATH       Contact information for follow-up providers    Swinteck, Aaron Edelman, MD. Schedule an appointment as soon as possible for a visit in 2 weeks.   Specialty:  Orthopedic Surgery Why:  For wound re-check Contact information: [address] [address] [phone]            Contact information for after-discharge care    Destination    [facility] .   Specialty:  [place] information: [place] Barrera [phone]                 Allergies  Allergen Reactions  . Codeine Other (See Comments)    headache  . Benicar Hct [Olmesartan Medoxomil-Hctz] Swelling and Rash  . Naproxen Itching, Rash and Other (See Comments)    Redness/flushing of skin    Consultations:  Orthopedic surgery   Procedures/Studies: Pelvis  Portable  Result Date: 06/20/2017 CLINICAL DATA:  83 y/o  F; postop right hip nail placement. EXAM: PORTABLE PELVIS 1-2 VIEWS COMPARISON:  06/20/2017 right hip radiographs. FINDINGS: Right proximal femur intramedullary nail fixing an intertrochanteric fracture with proximal screw traversing femoral neck and distal interlocking threaded screw. Air and edema in the soft tissues are compatible postsurgical changes. Lesser trochanter fracture with increased medial avulsion in comparison with prior radiographs. No new acute fracture identified. Left proximal femur hardware without apparent hardware related complication. IMPRESSION: 1. Right femur intertrochanteric fracture post intramedullary nail fixation with expected postsurgical changes. 2. Right lesser trochanter fracture with interval increase medial displacement. Electronically Signed   By: Kristine Garbe M.D.   On: 06/20/2017 22:57   Dg Chest Portable 1 View  Result Date: 06/20/2017 CLINICAL DATA:  Initial evaluation for hip fracture, history of COPD. EXAM: PORTABLE CHEST 1 VIEW COMPARISON:  Prior radiograph from 06/18/2016. FINDINGS: Moderate cardiomegaly, stable. Mediastinal silhouette within normal limits. Aortic atherosclerosis. Lungs normally inflated. Underlying COPD. Diffuse pulmonary vascular congestion without frank pulmonary edema. No focal infiltrates. No pleural effusion. No pneumothorax. No acute osseus abnormality.  Osteopenia. IMPRESSION: 1. Cardiomegaly with diffuse pulmonary vascular congestion without frank pulmonary edema. 2. Underlying COPD. 3. Aortic atherosclerosis. Electronically Signed   By: Jeannine Boga  M.D.   On: 06/20/2017 00:13   Dg C-arm 1-60 Min  Result Date: 06/20/2017 CLINICAL DATA:  Elective surgery. EXAM: RIGHT FEMUR 2 VIEWS; DG C-ARM 61-120 MIN COMPARISON:  Preoperative radiographs earlier this day. FINDINGS: Three fluoroscopic spot images in the operating room demonstrate intramedullary nail with  trans trochanteric lag screw fixating intertrochanteric right femur fracture. Fluoroscopy time 1 minutes 6 seconds. IMPRESSION: Procedural fluoroscopy post intramedullary nail and lag screw fixating intertrochanteric femur fracture. Electronically Signed   By: Jeb Levering M.D.   On: 06/20/2017 22:46   Dg Hip Unilat  With Pelvis 2-3 Views Right  Result Date: 06/19/2017 CLINICAL DATA:  Fall with right hip pain EXAM: DG HIP (WITH OR WITHOUT PELVIS) 2-3V RIGHT COMPARISON:  05/30/2013 FINDINGS: Status post surgical rodding of the left femur with intact hardware. Old fracture deformity of the left femoral trochanter. Pubic symphysis is intact. The rami also appear intact. Mild feces impaction in the rectum. SI joints are symmetric. Comminuted and slightly impacted intertrochanteric fracture of the right proximal femur. No dislocation. Mild to moderate arthritis of the right hip IMPRESSION: 1. Acute slightly impacted intertrochanteric fracture of the proximal right femur. No dislocation 2. Status post intramedullary rodding of left femur across old fracture deformity Electronically Signed   By: Donavan Foil M.D.   On: 06/19/2017 23:03   Dg Femur, Min 2 Views Right  Result Date: 06/20/2017 CLINICAL DATA:  Elective surgery. EXAM: RIGHT FEMUR 2 VIEWS; DG C-ARM 61-120 MIN COMPARISON:  Preoperative radiographs earlier this day. FINDINGS: Three fluoroscopic spot images in the operating room demonstrate intramedullary nail with trans trochanteric lag screw fixating intertrochanteric right femur fracture. Fluoroscopy time 1 minutes 6 seconds. IMPRESSION: Procedural fluoroscopy post intramedullary nail and lag screw fixating intertrochanteric femur fracture. Electronically Signed   By: Jeb Levering M.D.   On: 06/20/2017 22:46   Dg Femur Port, Min 2 Views Right  Result Date: 06/20/2017 CLINICAL DATA:  Intertrochanteric fracture of proximal femur. EXAM: RIGHT FEMUR PORTABLE 2 VIEW COMPARISON:  None. FINDINGS:  Mildly displaced and possibly comminuted fracture is seen involving the intertrochanteric region of proximal right femur. No fracture is seen involving the middle or distal aspects of the right femur. IMPRESSION: Displaced and possibly comminuted intertrochanteric fracture of proximal right femur. No other abnormality seen in the right femur. Electronically Signed   By: Marijo Conception, M.D.   On: 06/20/2017 08:44     Subjective: No concerns today  Discharge Exam: Vitals:   06/23/17 0558 06/23/17 1327  BP: 139/80 (!) 143/73  Pulse: 80 88  Resp: 19 18  Temp: 98 F (36.7 C) 98.7 F (37.1 C)   Vitals:   06/22/17 1706 06/22/17 2249 06/23/17 0558 06/23/17 1327  BP: (!) 105/50 132/75 139/80 (!) 143/73  Pulse: (!) 112 85 80 88  Resp:  18 19 18   Temp:   98 F (36.7 C) 98.7 F (37.1 C)  TempSrc:   Oral   SpO2: (!) 85% 95% 99% 99%  Weight:      Height:        General: Pt is alert, awake, not in acute distress Cardiovascular: RRR, S1/S2 +, no rubs, no gallops Respiratory: CTA bilaterally, no wheezing, no rhonchi Abdominal: Soft, NT, ND, bowel sounds + Extremities: no edema, no cyanosis, right hip with dressings over two surgical wounds without induration or erythema    The results of significant diagnostics from this hospitalization (including imaging, microbiology, ancillary and laboratory) are listed below for reference.  Microbiology: Recent Results (from the past 240 hour(s))  Surgical PCR screen     Status: Abnormal   Collection Time: 06/20/17 10:49 AM  Result Value Ref Range Status   MRSA, PCR NEGATIVE NEGATIVE Final   Staphylococcus aureus POSITIVE (A) NEGATIVE Final    Comment:        The Xpert SA Assay (FDA approved for NASAL specimens in patients over 40 years of age), is one component of a comprehensive surveillance program.  Test performance has been validated by [facility] for patients greater than or equal to 24 year old. It is not intended to  diagnose infection nor to guide or monitor treatment.      Labs: BNP (last 3 results) No results for input(s): BNP in the last 8760 hours. Basic Metabolic Panel:  Recent Labs Lab 06/19/17 2354 06/20/17 0708 06/21/17 0512 06/22/17 0331  NA 135 134* 134* 129*  K 4.1 3.8 3.8 3.9  CL 91* 92* 95* 91*  CO2 31 36* 32 34*  GLUCOSE 141* 136* 134* 104*  BUN 13 12 14 15   CREATININE 0.36* 0.41* 0.43* 0.41*  CALCIUM 9.8 9.6 8.6* 8.7*   Liver Function Tests: No results for input(s): AST, ALT, ALKPHOS, BILITOT, PROT, ALBUMIN in the last 168 hours. No results for input(s): LIPASE, AMYLASE in the last 168 hours. No results for input(s): AMMONIA in the last 168 hours. CBC:  Recent Labs Lab 06/19/17 2354 06/20/17 0708 06/21/17 0512 06/22/17 0331 06/22/17 1426 06/23/17 0537 06/23/17 1227  WBC 9.6 10.4 14.4* 11.7*  --  9.2  --   NEUTROABS 8.0*  --   --   --   --   --   --   HGB 12.0 11.8* 10.0* 8.2* 7.6* 7.5* 7.5*  HCT 37.3 37.6 31.5* 25.8* 23.6* 23.4* 23.6*  MCV 90.5 91.9 89.7 90.5  --  89.7  --   PLT 232 210 170 156  --  160  --    Cardiac Enzymes: No results for input(s): CKTOTAL, CKMB, CKMBINDEX, TROPONINI in the last 168 hours. BNP: Invalid input(s): POCBNP CBG: No results for input(s): GLUCAP in the last 168 hours. D-Dimer No results for input(s): DDIMER in the last 72 hours. Hgb A1c No results for input(s): HGBA1C in the last 72 hours. Lipid Profile No results for input(s): CHOL, HDL, LDLCALC, TRIG, CHOLHDL, LDLDIRECT in the last 72 hours. Thyroid function studies No results for input(s): TSH, T4TOTAL, T3FREE, THYROIDAB in the last 72 hours.  Invalid input(s): FREET3 Anemia work up No results for input(s): VITAMINB12, FOLATE, FERRITIN, TIBC, IRON, RETICCTPCT in the last 72 hours. Urinalysis    Component Value Date/Time   COLORURINE YELLOW 04/07/2014 2303   APPEARANCEUR CLEAR 04/07/2014 2303   LABSPEC 1.010 04/07/2014 2303   PHURINE 6.0 04/07/2014 2303    GLUCOSEU NEGATIVE 04/07/2014 2303   HGBUR NEGATIVE 04/07/2014 2303   BILIRUBINUR NEGATIVE 04/07/2014 2303   KETONESUR NEGATIVE 04/07/2014 2303   PROTEINUR NEGATIVE 04/07/2014 2303   UROBILINOGEN 0.2 04/07/2014 2303   NITRITE NEGATIVE 04/07/2014 2303   LEUKOCYTESUR NEGATIVE 04/07/2014 2303   Sepsis Labs Invalid input(s): PROCALCITONIN,  WBC,  LACTICIDVEN Microbiology Recent Results (from the past 240 hour(s))  Surgical PCR screen     Status: Abnormal   Collection Time: 06/20/17 10:49 AM  Result Value Ref Range Status   MRSA, PCR NEGATIVE NEGATIVE Final   Staphylococcus aureus POSITIVE (A) NEGATIVE Final    Comment:        The Xpert SA Assay (FDA approved for  NASAL specimens in patients over 24 years of age), is one component of a comprehensive surveillance program.  Test performance has been validated by [facility] for patients greater than or equal to 29 year old. It is not intended to diagnose infection nor to guide or monitor treatment.      Time coordinating discharge: Over 30 minutes  SIGNED:   Cordelia Poche, MD [facility] 06/23/2017, 2:06 PM Pager [phone]  If 7PM-7AM, please contact night-coverage [email or web address] Password [email or web address]

## 2017-06-23 NOTE — Progress Notes (Signed)
Patient was discharged to nursing home [facility]) by MD order; discharged instructions review and sent to facility via EMS with care notes and prescriptions; IV DIC; patient will be transported to facility via [place]. Facility was called for report but the nurse said that they are not ready now for report. RN gave the phone number for report when they will be available.

## 2017-06-23 NOTE — Care Management Note (Signed)
Case Management Note  Patient Details  Name: Tiffany Maxwell MRN: [id number] Date of Birth: [birth year]  Subjective/Objective:                 Patient with orders and notes to DC to SNF. Facilitated by CSW.    Action/Plan:   Expected Discharge Date:  06/23/17               Expected Discharge Plan:  [place]  In-House Referral:     Discharge planning Services  CM Consult  Post Acute Care Choice:  Durable Medical Equipment, Resumption of Svcs/PTA Provider Choice offered to:  Patient  DME Arranged:  [facility] DME Agency:     HH Arranged:    [place] Agency:     Status of Service:  Completed, signed off  If discussed at [place] of Stay Meetings, dates discussed:    Additional Comments:  Carles Collet, RN 06/23/2017, 2:06 PM

## 2017-06-23 NOTE — Progress Notes (Signed)
Clinical Social Worker facilitated patient discharge including contacting patient family and facility to confirm patient discharge plans.  Clinical information faxed to facility and family agreeable with plan.  CSW arranged ambulance transport via [facility] to [facility] .  RN to call [phone] (pt will be in rm 202A, please ask to speak to RN that ha the 200 hall) for report prior to discharge.  Clinical Social Worker will sign off for now as social work intervention is no longer needed. Please consult [place] again if new need arises.  Rhea Pink, MSW, [place]

## 2017-06-25 NOTE — Consult Note (Signed)
           [facility] CM Primary Care Navigator  06/25/2017  ROSANGELA FEHRENBACH 1934/11/20 [id number]   Went to see patient earlier at the bedside earlier to identify possible discharge needs but she was already discharged.  Patient was discharged to skilled nursing facility per therapy recommendation. She went to [facility]) this weekend prior to returning back home.  Primary care provider's office called ([place] notified of patient's discharge, need for post skilled nursing facilitydischarge follow-up and transition of care, as well as health issues needing follow-up.   Made aware to refer patient to [facility] care management if deemed appropriate for services.   For questions, please contact:  Dannielle Huh, BSN, RN- [facility] Primary Care Navigator  Telephone: [phone] [place]

## 2017-06-27 DIAGNOSIS — J440 Chronic obstructive pulmonary disease with acute lower respiratory infection: Secondary | ICD-10-CM | POA: Diagnosis not present

## 2017-06-27 DIAGNOSIS — E43 Unspecified severe protein-calorie malnutrition: Secondary | ICD-10-CM | POA: Diagnosis not present

## 2017-06-27 DIAGNOSIS — I5042 Chronic combined systolic (congestive) and diastolic (congestive) heart failure: Secondary | ICD-10-CM | POA: Diagnosis not present

## 2017-06-27 DIAGNOSIS — R41841 Cognitive communication deficit: Secondary | ICD-10-CM | POA: Diagnosis not present

## 2017-06-30 DIAGNOSIS — I10 Essential (primary) hypertension: Secondary | ICD-10-CM | POA: Diagnosis not present

## 2017-06-30 DIAGNOSIS — M810 Age-related osteoporosis without current pathological fracture: Secondary | ICD-10-CM | POA: Diagnosis not present

## 2017-06-30 DIAGNOSIS — S72001D Fracture of unspecified part of neck of right femur, subsequent encounter for closed fracture with routine healing: Secondary | ICD-10-CM | POA: Diagnosis not present

## 2017-06-30 DIAGNOSIS — I5042 Chronic combined systolic (congestive) and diastolic (congestive) heart failure: Secondary | ICD-10-CM | POA: Diagnosis not present

## 2017-07-10 ENCOUNTER — Encounter (HOSPITAL_COMMUNITY): Payer: Self-pay

## 2017-07-10 ENCOUNTER — Inpatient Hospital Stay (HOSPITAL_COMMUNITY)
Admission: EM | Admit: 2017-07-10 | Discharge: 2017-07-13 | DRG: 377 | Disposition: A | Discharge status: Skilled Nursing Facility | Payer: Medicare Other | Attending: Family Medicine | Admitting: Family Medicine

## 2017-07-10 DIAGNOSIS — Z79899 Other long term (current) drug therapy: Secondary | ICD-10-CM

## 2017-07-10 DIAGNOSIS — I252 Old myocardial infarction: Secondary | ICD-10-CM

## 2017-07-10 DIAGNOSIS — E871 Hypo-osmolality and hyponatremia: Secondary | ICD-10-CM | POA: Diagnosis not present

## 2017-07-10 DIAGNOSIS — Z4789 Encounter for other orthopedic aftercare: Secondary | ICD-10-CM | POA: Diagnosis not present

## 2017-07-10 DIAGNOSIS — Z885 Allergy status to narcotic agent status: Secondary | ICD-10-CM

## 2017-07-10 DIAGNOSIS — I959 Hypotension, unspecified: Secondary | ICD-10-CM | POA: Diagnosis present

## 2017-07-10 DIAGNOSIS — D649 Anemia, unspecified: Secondary | ICD-10-CM | POA: Diagnosis not present

## 2017-07-10 DIAGNOSIS — E43 Unspecified severe protein-calorie malnutrition: Secondary | ICD-10-CM | POA: Diagnosis present

## 2017-07-10 DIAGNOSIS — Z66 Do not resuscitate: Secondary | ICD-10-CM | POA: Diagnosis present

## 2017-07-10 DIAGNOSIS — K922 Gastrointestinal hemorrhage, unspecified: Secondary | ICD-10-CM | POA: Diagnosis not present

## 2017-07-10 DIAGNOSIS — K449 Diaphragmatic hernia without obstruction or gangrene: Secondary | ICD-10-CM | POA: Diagnosis present

## 2017-07-10 DIAGNOSIS — K921 Melena: Secondary | ICD-10-CM | POA: Diagnosis present

## 2017-07-10 DIAGNOSIS — I110 Hypertensive heart disease with heart failure: Secondary | ICD-10-CM | POA: Diagnosis present

## 2017-07-10 DIAGNOSIS — Z7982 Long term (current) use of aspirin: Secondary | ICD-10-CM | POA: Diagnosis not present

## 2017-07-10 DIAGNOSIS — Z886 Allergy status to analgesic agent status: Secondary | ICD-10-CM

## 2017-07-10 DIAGNOSIS — Z8249 Family history of ischemic heart disease and other diseases of the circulatory system: Secondary | ICD-10-CM | POA: Diagnosis not present

## 2017-07-10 DIAGNOSIS — Z888 Allergy status to other drugs, medicaments and biological substances status: Secondary | ICD-10-CM

## 2017-07-10 DIAGNOSIS — M810 Age-related osteoporosis without current pathological fracture: Secondary | ICD-10-CM | POA: Diagnosis present

## 2017-07-10 DIAGNOSIS — Z681 Body mass index (BMI) 19 or less, adult: Secondary | ICD-10-CM

## 2017-07-10 DIAGNOSIS — K5900 Constipation, unspecified: Secondary | ICD-10-CM | POA: Diagnosis not present

## 2017-07-10 DIAGNOSIS — S72141D Displaced intertrochanteric fracture of right femur, subsequent encounter for closed fracture with routine healing: Secondary | ICD-10-CM

## 2017-07-10 DIAGNOSIS — F411 Generalized anxiety disorder: Secondary | ICD-10-CM | POA: Diagnosis present

## 2017-07-10 DIAGNOSIS — I5042 Chronic combined systolic (congestive) and diastolic (congestive) heart failure: Secondary | ICD-10-CM | POA: Diagnosis not present

## 2017-07-10 DIAGNOSIS — R4182 Altered mental status, unspecified: Secondary | ICD-10-CM | POA: Diagnosis not present

## 2017-07-10 DIAGNOSIS — J9611 Chronic respiratory failure with hypoxia: Secondary | ICD-10-CM | POA: Diagnosis present

## 2017-07-10 DIAGNOSIS — K222 Esophageal obstruction: Secondary | ICD-10-CM | POA: Diagnosis present

## 2017-07-10 DIAGNOSIS — E785 Hyperlipidemia, unspecified: Secondary | ICD-10-CM | POA: Diagnosis not present

## 2017-07-10 DIAGNOSIS — M6281 Muscle weakness (generalized): Secondary | ICD-10-CM | POA: Diagnosis not present

## 2017-07-10 DIAGNOSIS — K2951 Unspecified chronic gastritis with bleeding: Secondary | ICD-10-CM | POA: Diagnosis not present

## 2017-07-10 DIAGNOSIS — F329 Major depressive disorder, single episode, unspecified: Secondary | ICD-10-CM | POA: Diagnosis not present

## 2017-07-10 DIAGNOSIS — K269 Duodenal ulcer, unspecified as acute or chronic, without hemorrhage or perforation: Secondary | ICD-10-CM | POA: Diagnosis not present

## 2017-07-10 DIAGNOSIS — Z9981 Dependence on supplemental oxygen: Secondary | ICD-10-CM

## 2017-07-10 DIAGNOSIS — F419 Anxiety disorder, unspecified: Secondary | ICD-10-CM | POA: Diagnosis not present

## 2017-07-10 DIAGNOSIS — Z8781 Personal history of (healed) traumatic fracture: Secondary | ICD-10-CM

## 2017-07-10 DIAGNOSIS — X58XXXD Exposure to other specified factors, subsequent encounter: Secondary | ICD-10-CM | POA: Diagnosis present

## 2017-07-10 DIAGNOSIS — S72001D Fracture of unspecified part of neck of right femur, subsequent encounter for closed fracture with routine healing: Secondary | ICD-10-CM | POA: Diagnosis not present

## 2017-07-10 DIAGNOSIS — K264 Chronic or unspecified duodenal ulcer with hemorrhage: Secondary | ICD-10-CM | POA: Diagnosis present

## 2017-07-10 DIAGNOSIS — I10 Essential (primary) hypertension: Secondary | ICD-10-CM | POA: Diagnosis not present

## 2017-07-10 DIAGNOSIS — D62 Acute posthemorrhagic anemia: Secondary | ICD-10-CM | POA: Diagnosis present

## 2017-07-10 DIAGNOSIS — R1311 Dysphagia, oral phase: Secondary | ICD-10-CM | POA: Diagnosis not present

## 2017-07-10 DIAGNOSIS — R41841 Cognitive communication deficit: Secondary | ICD-10-CM | POA: Diagnosis not present

## 2017-07-10 DIAGNOSIS — J449 Chronic obstructive pulmonary disease, unspecified: Secondary | ICD-10-CM | POA: Diagnosis not present

## 2017-07-10 DIAGNOSIS — S22000D Wedge compression fracture of unspecified thoracic vertebra, subsequent encounter for fracture with routine healing: Secondary | ICD-10-CM | POA: Diagnosis not present

## 2017-07-10 DIAGNOSIS — Z87891 Personal history of nicotine dependence: Secondary | ICD-10-CM

## 2017-07-10 LAB — BASIC METABOLIC PANEL
Anion gap: 5 (ref 5–15)
BUN: 53 mg/dL — ABNORMAL HIGH (ref 6–20)
CHLORIDE: 93 mmol/L — AB (ref 101–111)
CO2: 32 mmol/L (ref 22–32)
Calcium: 8.8 mg/dL — ABNORMAL LOW (ref 8.9–10.3)
Creatinine, Ser: 0.55 mg/dL (ref 0.44–1.00)
GFR calc Af Amer: >60 mL/min (ref >60)
GFR calc non Af Amer: >60 mL/min (ref >60)
GLUCOSE: 112 mg/dL — AB (ref 65–99)
Potassium: 4.2 mmol/L (ref 3.5–5.1)
Sodium: 130 mmol/L — ABNORMAL LOW (ref 135–145)

## 2017-07-10 LAB — CBC WITH DIFFERENTIAL/PLATELET
Basophils Absolute: 0 10*3/uL (ref 0.0–0.1)
Basophils Relative: 0 %
Eosinophils Absolute: 0.1 10*3/uL (ref 0.0–0.7)
Eosinophils Relative: 1 %
HEMATOCRIT: 16.3 % — AB (ref 36.0–46.0)
HEMOGLOBIN: 5 g/dL — AB (ref 12.0–15.0)
LYMPHS ABS: 1.1 10*3/uL (ref 0.7–4.0)
LYMPHS PCT: 14 %
MCH: 29.2 pg (ref 26.0–34.0)
MCHC: 30.7 g/dL (ref 30.0–36.0)
MCV: 95.3 fL (ref 78.0–100.0)
MONO ABS: 0.4 10*3/uL (ref 0.1–1.0)
MONOS PCT: 6 %
NEUTROS PCT: 79 %
Neutro Abs: 5.8 10*3/uL (ref 1.7–7.7)
Platelets: 223 10*3/uL (ref 150–400)
RBC: 1.71 MIL/uL — ABNORMAL LOW (ref 3.87–5.11)
RDW: 15.8 % — AB (ref 11.5–15.5)
WBC: 7.4 10*3/uL (ref 4.0–10.5)

## 2017-07-10 LAB — POC OCCULT BLOOD, ED: FECAL OCCULT BLD: POSITIVE — AB

## 2017-07-10 LAB — MAGNESIUM: MAGNESIUM: 2 mg/dL (ref 1.7–2.4)

## 2017-07-10 LAB — PHOSPHORUS: PHOSPHORUS: 2.9 mg/dL (ref 2.5–4.6)

## 2017-07-10 LAB — PREPARE RBC (CROSSMATCH)

## 2017-07-10 MED ORDER — SODIUM CHLORIDE 0.9 % IV SOLN: Freq: Once | INTRAVENOUS | Status: DC

## 2017-07-10 MED ORDER — ACETAMINOPHEN 325 MG PO TABS
650.0000 mg | ORAL_TABLET | Freq: Four times a day (QID) | ORAL | Status: DC | PRN
  Filled 2017-07-10: qty 2

## 2017-07-10 MED ORDER — SODIUM CHLORIDE 0.9 % IV BOLUS (SEPSIS)
1000.0000 mL | Freq: Once | INTRAVENOUS | Status: AC
  Administered 2017-07-10: 1000 mL via INTRAVENOUS

## 2017-07-10 MED ORDER — IPRATROPIUM-ALBUTEROL 0.5-2.5 (3) MG/3ML IN SOLN: 3.0000 mL | Freq: Once | RESPIRATORY_TRACT | Status: DC

## 2017-07-10 MED ORDER — ACETAMINOPHEN 650 MG RE SUPP: 650.0000 mg | Freq: Four times a day (QID) | RECTAL | Status: DC | PRN

## 2017-07-10 MED ORDER — ALBUTEROL SULFATE (2.5 MG/3ML) 0.083% IN NEBU: 2.5000 mg | INHALATION_SOLUTION | RESPIRATORY_TRACT | Status: DC | PRN

## 2017-07-10 MED ORDER — MORPHINE SULFATE (PF) 2 MG/ML IV SOLN: 1.0000 mg | INTRAVENOUS | Status: DC | PRN

## 2017-07-10 MED ORDER — IPRATROPIUM-ALBUTEROL 20-100 MCG/ACT IN AERS: 1.0000 | INHALATION_SPRAY | Freq: Four times a day (QID) | RESPIRATORY_TRACT | Status: DC | PRN

## 2017-07-10 MED ORDER — PANTOPRAZOLE SODIUM 40 MG IV SOLR
40.0000 mg | Freq: Two times a day (BID) | INTRAVENOUS | Status: DC
  Administered 2017-07-11: 40 mg via INTRAVENOUS
  Filled 2017-07-10: qty 40

## 2017-07-10 MED ORDER — SODIUM CHLORIDE 0.9 % IV SOLN
80.0000 mg | Freq: Once | INTRAVENOUS | Status: AC
  Administered 2017-07-10: 80 mg via INTRAVENOUS
  Filled 2017-07-10 (×2): qty 80

## 2017-07-10 MED ORDER — SODIUM CHLORIDE 0.9 % IV SOLN
Freq: Once | INTRAVENOUS | Status: AC
  Administered 2017-07-10: 13:00:00 via INTRAVENOUS

## 2017-07-10 MED ORDER — IPRATROPIUM-ALBUTEROL 0.5-2.5 (3) MG/3ML IN SOLN
3.0000 mL | Freq: Four times a day (QID) | RESPIRATORY_TRACT | Status: DC | PRN
  Administered 2017-07-13: 3 mL via RESPIRATORY_TRACT
  Filled 2017-07-10: qty 3

## 2017-07-10 MED ORDER — SODIUM CHLORIDE 0.9 % IV SOLN
INTRAVENOUS | Status: DC
  Administered 2017-07-11 (×2): via INTRAVENOUS

## 2017-07-10 MED ORDER — ALBUTEROL SULFATE HFA 108 (90 BASE) MCG/ACT IN AERS: 1.0000 | INHALATION_SPRAY | RESPIRATORY_TRACT | Status: DC | PRN

## 2017-07-10 MED ORDER — SODIUM CHLORIDE 0.9% FLUSH
3.0000 mL | Freq: Two times a day (BID) | INTRAVENOUS | Status: DC
  Administered 2017-07-11 – 2017-07-13 (×4): 3 mL via INTRAVENOUS

## 2017-07-10 NOTE — ED Provider Notes (Signed)
[facility] DEPT Provider Note   CSN: [id number] Arrival date & time: 07/10/17  1218     History   Chief Complaint Chief Complaint  Patient presents with  . Abnormal Lab    HPI Tiffany Maxwell is a 83 y.o. female who presents with anemia. Past medical history significant for COPD and recent hip fracture requiring ORIF. She is been residing in a SNF for rehabilitation after her discharge. She went to her doctor's office for routine blood check today and hemoglobin was noted to be 5.5. She was subsequently transported to the emergency department for blood administration. The patient denies any symptoms. She reports feeling well but is confused.  HPI  Past Medical History:  Diagnosis Date  . CHF (congestive heart failure) ([place])   . Cholelithiasis 01/30/2013  . Compression fracture of lumbar spine, non-traumatic ([place]) 04/09/2014  . COPD (chronic obstructive pulmonary disease) ([place])   . Diastolic dysfunction 4/43/1540   Grade 1. Ejection fraction 65%.  . Hip fracture ([place]) 06/19/2017  . History of tobacco use 01/30/2013  . Hyperlipidemia   . Hypertension   . MI (myocardial infarction) ([place])    pt unaware  . Multiple fractures of thoracic spine, closed ([place]) 01/30/2013  . Osteoporosis   . PAC (premature atrial contraction)   . Palpitations   . Pulmonary emphysema ([place]) 01/30/2013  . Seborrheic keratosis     Patient Active Problem List   Diagnosis Date Noted  . Closed comminuted intertrochanteric fracture of proximal end of right femur ([place]) 06/21/2017  . Protein-calorie malnutrition, severe 06/21/2017  . Hip fracture ([place]) 06/20/2017  . Chronic combined systolic and diastolic CHF (congestive heart failure) ([place]) 06/20/2017  . GAD (generalized anxiety disorder) 11/06/2016  . Back pain 04/08/2014  . Multiple fractures of thoracic spine, closed ([place]) 01/30/2013  . Pulmonary emphysema ([place]) 01/30/2013  . History of tobacco use 01/30/2013  . Diastolic dysfunction 08/67/6195    . Hypokalemia 01/29/2013  . Osteoporosis 01/29/2013  . COPD with chronic bronchitis ([place]) 01/28/2013  . Compression fracture 01/28/2013  . HTN (hypertension) 01/28/2013    Past Surgical History:  Procedure Laterality Date  . COMPRESSION HIP SCREW Left 04/29/2013   Procedure: COMPRESSION HIP;  Surgeon: Sanjuana Kava, MD;  Location: [facility];  Service: Orthopedics;  Laterality: Left;  [place]  . FEMUR IM NAIL Right 06/20/2017   Procedure: INTRAMEDULLARY (IM) NAIL FEMORAL;  Surgeon: Rod Can, MD;  Location: [facility];  Service: Orthopedics;  Laterality: Right;  . TUBAL LIGATION      OB History    No data available       Home Medications    Prior to Admission medications   Medication Sig Start Date End Date Taking? Authorizing Provider  aspirin EC 81 MG tablet Take 81 mg by mouth daily.    [provider]  benzonatate (TESSALON) 100 MG capsule TAKE 1 CAPSULE TWICE DAILY AS NEEDED FOR COUGH 06/07/17   Hassell Done, Mary-Margaret, FNP  Calcium Carbonate-Vitamin D (CALCIUM 600+D3) 600-400 MG-UNIT per tablet Take 1 tablet by mouth 2 (two) times daily. Patient taking differently: Take 1 tablet by mouth daily.  04/09/14   Rexene Alberts, MD  citalopram (CELEXA) 20 MG tablet Take 1 tablet (20 mg total) by mouth daily. 06/24/17   Mariel Aloe, MD  enoxaparin (LOVENOX) 40 MG/0.4ML injection Inject 0.4 mLs (40 mg total) into the skin daily. 06/21/17   Swinteck, Aaron Edelman, MD  feeding supplement, ENSURE ENLIVE, (ENSURE ENLIVE) LIQD Take 237 mLs by mouth 2 (two)  times daily between meals. 06/24/17   Mariel Aloe, MD  ferrous sulfate 325 (65 FE) MG tablet Take 1 tablet (325 mg total) by mouth 2 (two) times daily with a meal. 06/23/17   Mariel Aloe, MD  HYDROcodone-acetaminophen (NORCO/VICODIN) 5-325 MG tablet Take 1-2 tablets by mouth every 6 (six) hours as needed for moderate pain. 06/21/17   Swinteck, Aaron Edelman, MD  Ipratropium-Albuterol (COMBIVENT RESPIMAT) 20-100 MCG/ACT AERS respimat  Inhale 1 puff into the lungs every 6 (six) hours as needed for wheezing. 02/07/17   Hassell Done, Mary-Margaret, FNP  lisinopril (PRINIVIL,ZESTRIL) 40 MG tablet Take 1 tablet (40 mg total) by mouth daily. 02/07/17   Hassell Done, Mary-Margaret, FNP  polyethylene glycol powder (GLYCOLAX/MIRALAX) powder 17 GRAMS (1 CAPFUL) ONCE A DAY AS DIRECTED 07/03/16   [name], FNP  VENTOLIN HFA 108 (90 Base) MCG/ACT inhaler 2 PUFFS EVERY 6 HOURS AS NEEDED FOR WHEEZING OR SHORTNESS OF BREATH 04/03/17   [name], FNP    Family History Family History  Problem Relation Age of Onset  . Hypertension Mother     Social History Social History  Substance Use Topics  . Smoking status: Former Smoker    Packs/day: 1.00    Years: 40.00    Types: Cigarettes    Quit date: 07/10/2012  . Smokeless tobacco: Former Systems developer  . Alcohol use No     Allergies   Codeine; Benicar hct [olmesartan medoxomil-hctz]; and Naproxen   Review of Systems Review of Systems  Constitutional: Negative for fever.       Feels cold  Respiratory: Positive for shortness of breath (chronic).   Cardiovascular: Negative for chest pain.  Gastrointestinal: Negative for abdominal pain, diarrhea, nausea and vomiting.  All other systems reviewed and are negative.    Physical Exam Updated Vital Signs There were no vitals taken for this visit.  Physical Exam  Constitutional: She is oriented to person, place, and time. She appears well-developed and well-nourished. No distress.  Frail elderly female in no acute distress. Pleasantly confused but interactive  HENT:  Head: Normocephalic and atraumatic.  Pale conjunctiva, pallor  Eyes: Pupils are equal, round, and reactive to light. Conjunctivae are normal. Right eye exhibits no discharge. Left eye exhibits no discharge. No scleral icterus.  Neck: Normal range of motion.  Cardiovascular: Normal rate and regular rhythm.  Exam reveals no gallop and no friction rub.   No murmur  heard. Pulmonary/Chest: Effort normal. No respiratory distress. She has decreased breath sounds. She has no wheezes. She has no rales. She exhibits no tenderness.  Abdominal: Soft. Bowel sounds are normal. She exhibits no distension and no mass. There is no tenderness. There is no rebound and no guarding. No hernia.  Genitourinary:  Genitourinary Comments: Rectal: Melanotic stool noted  Musculoskeletal:  Large amount of ecchymosis over R hip and thigh. Surgical wound is healing well  Neurological: She is alert and oriented to person, place, and time.  Skin: Skin is warm and dry.  Psychiatric: She has a normal mood and affect. Her behavior is normal.  Nursing note and vitals reviewed.    ED Treatments / Results  Labs (all labs ordered are listed, but only abnormal results are displayed) Labs Reviewed  BASIC METABOLIC PANEL - Abnormal; Notable for the following:       Result Value   Sodium 130 (*)    Chloride 93 (*)    Glucose, Bld 112 (*)    BUN 53 (*)    Calcium 8.8 (*)  All other components within normal limits  CBC WITH DIFFERENTIAL/PLATELET - Abnormal; Notable for the following:    RBC 1.71 (*)    Hemoglobin 5.0 (*)    HCT 16.3 (*)    RDW 15.8 (*)    All other components within normal limits  POC OCCULT BLOOD, ED - Abnormal; Notable for the following:    Fecal Occult Bld POSITIVE (*)    All other components within normal limits  VITAMIN B12  FOLATE  IRON AND TIBC  FERRITIN  RETICULOCYTES  TSH  I-STAT CHEM 8, ED  PREPARE RBC (CROSSMATCH)  TYPE AND SCREEN    EKG  EKG Interpretation None       Radiology No results found.  Procedures Procedures (including critical care time)  CRITICAL CARE Performed by: Recardo Evangelist   Total critical care time: 30 minutes  Critical care time was exclusive of separately billable procedures and treating other patients.  Critical care was necessary to treat or prevent imminent or life-threatening  deterioration.  Critical care was time spent personally by me on the following activities: development of treatment plan with patient and/or surrogate as well as nursing, discussions with consultants, evaluation of patient's response to treatment, examination of patient, obtaining history from patient or surrogate, ordering and performing treatments and interventions, ordering and review of laboratory studies, ordering and review of radiographic studies, pulse oximetry and re-evaluation of patient's condition.   Medications Ordered in ED Medications  pantoprazole (PROTONIX) 80 mg in sodium chloride 0.9 % 100 mL IVPB (not administered)  0.9 %  sodium chloride infusion ( Intravenous New Bag/Given 07/10/17 1320)  sodium chloride 0.9 % bolus 1,000 mL (1,000 mLs Intravenous New Bag/Given 07/10/17 1313)     Initial Impression / Assessment and Plan / ED Course  I have reviewed the triage vital signs and the nursing notes.  Pertinent labs & imaging results that were available during my care of the patient were reviewed by me and considered in my medical decision making (see chart for details).  83 year old female with hgb of 5. She is hypotensive on arrival. IVF were immediately started. She is mildly confused but otherwise asymptomatic. She is very pale on exam and rectal revealed melanotic stool. Hemoccult was positive. 2 units of pRBCs ordered. Shared visit with Dr. Wilson Singer. After fluids BP is improved to low 100s. Spoke with Otis Dials, NP with [facility] who will admit.  Final Clinical Impressions(s) / ED Diagnoses   Final diagnoses:  Gastrointestinal hemorrhage with melena    New Prescriptions New Prescriptions   No medications on file     Iris Pert 07/10/17 1638    Virgel Manifold, MD 07/15/17 [phone]

## 2017-07-10 NOTE — Consult Note (Signed)
Referring Provider:  Linna Darner, MD Primary Care Physician:  [name], FNP Primary Gastroenterologist:  None (unassigned)  Reason for Consultation:  GI bleeding  HPI: Tiffany Maxwell is a 83 y.o. female approximately 2 weeks status post surgery for hip fracture, maintained on Lovenox and also on aspirin prior to admission, who started having rectal bleeding and was brought from the rehabilitation facility to the emergency room, where her hemoglobin was 5.0 (MCV 95, platelets normal at 223,000). Her BUN was elevated at 53, consistent with upper tract bleeding.  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     Past Medical History:  Diagnosis Date  . CHF (congestive heart failure) ([place])   . Cholelithiasis 01/30/2013  . Compression fracture of lumbar spine, non-traumatic ([place]) 04/09/2014  . COPD (chronic obstructive pulmonary disease) ([place])   . Diastolic dysfunction 0/26/3785   Grade 1. Ejection fraction 65%.  . Hip fracture ([place]) 06/19/2017  . History of tobacco use 01/30/2013  . Hyperlipidemia   . Hypertension   . MI (myocardial infarction) ([place])    pt unaware  . Multiple fractures of thoracic spine, closed ([place]) 01/30/2013  . Osteoporosis   . PAC (premature atrial contraction)   . Palpitations   . Pulmonary emphysema ([place]) 01/30/2013  . Seborrheic keratosis     Past Surgical History:  Procedure Laterality Date  . COMPRESSION HIP SCREW Left 04/29/2013   Procedure: COMPRESSION HIP;  Surgeon: Sanjuana Kava, MD;  Location: [facility];  Service: Orthopedics;  Laterality: Left;  [place]  . FEMUR IM NAIL Right 06/20/2017   Procedure: INTRAMEDULLARY (IM) NAIL FEMORAL;  Surgeon: Rod Can, MD;  Location: [facility];  Service: Orthopedics;  Laterality: Right;  . TUBAL LIGATION      Prior to Admission medications   Medication Sig Start Date End Date Taking? Authorizing  Provider  aspirin EC 81 MG tablet Take 81 mg by mouth daily.   Yes [provider]  benzonatate (TESSALON) 100 MG capsule TAKE 1 CAPSULE TWICE DAILY AS NEEDED FOR COUGH Patient taking differently: Take 100 mg by mouth two times a day as needed for coughing 06/07/17  Yes Hassell Done, Mary-Margaret, FNP  Calcium Carbonate-Vitamin D (CALCIUM 600+D3) 600-400 MG-UNIT per tablet Take 1 tablet by mouth 2 (two) times daily. 04/09/14  Yes Rexene Alberts, MD  citalopram (CELEXA) 20 MG tablet Take 1 tablet (20 mg total) by mouth daily. 06/24/17  Yes Mariel Aloe, MD  enoxaparin (LOVENOX) 40 MG/0.4ML injection Inject 0.4 mLs (40 mg total) into the skin daily. Patient taking differently: Inject 40 mg into the skin daily. END DATE: 07/23/17 06/21/17  Yes Swinteck, Aaron Edelman, MD  feeding supplement, ENSURE ENLIVE, (ENSURE ENLIVE) LIQD Take 237 mLs by mouth 2 (two) times daily between meals. 06/24/17  Yes Mariel Aloe, MD  ferrous sulfate 325 (65 FE) MG tablet Take 1 tablet (325 mg total) by mouth 2 (two) times daily with a meal. 06/23/17  Yes Mariel Aloe, MD  HYDROcodone-acetaminophen (NORCO/VICODIN) 5-325 MG tablet Take 1-2 tablets by mouth every 6 (six) hours as needed for moderate pain. Patient taking differently: Take 1-2 tablets by mouth every 6 (six) hours as needed for moderate pain or severe pain.  06/21/17  Yes Swinteck, Aaron Edelman, MD  Ipratropium-Albuterol (COMBIVENT RESPIMAT) 20-100 MCG/ACT AERS respimat Inhale 1 puff into the lungs every 6 (six) hours as needed for wheezing. 02/07/17  Yes  Hassell Done, Mary-Margaret, FNP  lisinopril (PRINIVIL,ZESTRIL) 40 MG tablet Take 1 tablet (40 mg total) by mouth daily. 02/07/17  Yes Martin, Mary-Margaret, FNP  OXYGEN Inhale 4 L into the lungs See admin instructions. TO MAINTAIN SATS OF 93% OR GREATER   Yes [provider]  polyethylene glycol powder (GLYCOLAX/MIRALAX) powder 17 GRAMS (1 CAPFUL) ONCE A DAY AS DIRECTED Patient taking differently: Mix and drink once a  day: 17 grams of powder into 6-8 ounces of juice or water 07/03/16  Yes Martin, Mary-Margaret, FNP  VENTOLIN HFA 108 (90 Base) MCG/ACT inhaler 2 PUFFS EVERY 6 HOURS AS NEEDED FOR WHEEZING OR SHORTNESS OF BREATH Patient taking differently: Inhale 2 puffs into the lungs every 6 hours as needed for wheezing or shortness of breath 04/03/17  Yes Hassell Done, Mary-Margaret, FNP  vitamin C (ASCORBIC ACID) 500 MG tablet Take 500 mg by mouth 2 (two) times daily.   Yes [provider]    Current Facility-Administered Medications  Medication Dose Route Frequency Provider Last Rate Last Dose  . ipratropium-albuterol (DUONEB) 0.5-2.5 (3) MG/3ML nebulizer solution 3 mL  3 mL Nebulization Once Recardo Evangelist, PA-C      . pantoprazole (PROTONIX) injection 40 mg  40 mg Intravenous Q12H Samella Parr, NP       Current Outpatient Prescriptions  Medication Sig Dispense Refill  . aspirin EC 81 MG tablet Take 81 mg by mouth daily.    . benzonatate (TESSALON) 100 MG capsule TAKE 1 CAPSULE TWICE DAILY AS NEEDED FOR COUGH (Patient taking differently: Take 100 mg by mouth two times a day as needed for coughing) 20 capsule 0  . Calcium Carbonate-Vitamin D (CALCIUM 600+D3) 600-400 MG-UNIT per tablet Take 1 tablet by mouth 2 (two) times daily.    . citalopram (CELEXA) 20 MG tablet Take 1 tablet (20 mg total) by mouth daily.    Marland Kitchen enoxaparin (LOVENOX) 40 MG/0.4ML injection Inject 0.4 mLs (40 mg total) into the skin daily. (Patient taking differently: Inject 40 mg into the skin daily. END DATE: 07/23/17) 30 Syringe 0  . feeding supplement, ENSURE ENLIVE, (ENSURE ENLIVE) LIQD Take 237 mLs by mouth 2 (two) times daily between meals.    . ferrous sulfate 325 (65 FE) MG tablet Take 1 tablet (325 mg total) by mouth 2 (two) times daily with a meal.    . HYDROcodone-acetaminophen (NORCO/VICODIN) 5-325 MG tablet Take 1-2 tablets by mouth every 6 (six) hours as needed for moderate pain. (Patient taking differently: Take 1-2  tablets by mouth every 6 (six) hours as needed for moderate pain or severe pain. ) 30 tablet 0  . Ipratropium-Albuterol (COMBIVENT RESPIMAT) 20-100 MCG/ACT AERS respimat Inhale 1 puff into the lungs every 6 (six) hours as needed for wheezing. 8 g 5  . lisinopril (PRINIVIL,ZESTRIL) 40 MG tablet Take 1 tablet (40 mg total) by mouth daily. 90 tablet 1  . OXYGEN Inhale 4 L into the lungs See admin instructions. TO MAINTAIN SATS OF 93% OR GREATER    . polyethylene glycol powder (GLYCOLAX/MIRALAX) powder 17 GRAMS (1 CAPFUL) ONCE A DAY AS DIRECTED (Patient taking differently: Mix and drink once a day: 17 grams of powder into 6-8 ounces of juice or water) 527 g 2  . VENTOLIN HFA 108 (90 Base) MCG/ACT inhaler 2 PUFFS EVERY 6 HOURS AS NEEDED FOR WHEEZING OR SHORTNESS OF BREATH (Patient taking differently: Inhale 2 puffs into the lungs every 6 hours as needed for wheezing or shortness of breath) 18 g 3  . vitamin  C (ASCORBIC ACID) 500 MG tablet Take 500 mg by mouth 2 (two) times daily.      Allergies as of 07/10/2017 - Review Complete 07/10/2017  Allergen Reaction Noted  . Codeine Other (See Comments) 01/28/2013  . Benicar hct [olmesartan medoxomil-hctz] Swelling and Rash 03/27/2013  . Hctz [hydrochlorothiazide] Swelling and Rash 07/10/2017  . Naproxen Itching, Rash, and Other (See Comments) 01/28/2013    Family History  Problem Relation Age of Onset  . Hypertension Mother     Social History   Social History  . Marital status: Widowed    Spouse name: N/A  . Number of children: N/A  . Years of education: N/A   Occupational History  . Not on file.   Social History Main Topics  . Smoking status: Former Smoker    Packs/day: 1.00    Years: 40.00    Types: Cigarettes    Quit date: 07/10/2012  . Smokeless tobacco: Former Systems developer  . Alcohol use No  . Drug use: No  . Sexual activity: No   Other Topics Concern  . Not on file   Social History Narrative  . No narrative on file      Physical  Exam: Vital signs in last 24 hours: Temp:  [97.6 F (36.4 C)-98.6 F (37 C)] 98.6 F (37 C) (07/31 1840) Pulse Rate:  [62-73] 73 (07/31 1840) Resp:  [15-25] 17 (07/31 1840) BP: (74-113)/(37-99) 102/45 (07/31 1840) SpO2:  [82 %-100 %] 100 % (07/31 1815) Weight:  [45.4 kg (100 lb)] 45.4 kg (100 lb) (07/31 1246)  This is an elderly, rather frail appearing patient with a smoker's appearance, in no distress, slightly confused (thinks the year is 2048, but knows her age). She is alert. She does not appear at all shocky. Conjunctivae  are very pale, skin is somewhat pale but warm and well perfused. No peripheral edema. Chest without rales or wheezes, probably some decrease in vesicular breath sounds. Heart sounds are distant but no murmur or arrhythmia is appreciated. Abdomen is scaphoid, without mass or tenderness.   Intake/Output from previous day: No intake/output data recorded. Intake/Output this shift: No intake/output data recorded.  Lab Results:  Recent Labs  07/10/17 1300  WBC 7.4  HGB 5.0*  HCT 16.3*  PLT 223   BMET  Recent Labs  07/10/17 1300  NA 130*  K 4.2  CL 93*  CO2 32  GLUCOSE 112*  BUN 53*  CREATININE 0.55  CALCIUM 8.8*   LFT No results for input(s): PROT, ALBUMIN, AST, ALT, ALKPHOS, BILITOT, BILIDIR, IBILI in the last 72 hours. PT/INR No results for input(s): LABPROT, INR in the last 72 hours.  Studies/Results: No results found.  Impression: Upper GI bleed, presumably aspirin-induced ulceration augmented by Lovenox treatment since her hip fracture surgery.  Plan: Agree with transfusions and pantoprazole as already ordered. Monitor labs. Endoscopy tomorrow. I discussed the nature, purpose, risks, and alternative (of observation) with the patient's son, Tiffany Maxwell [phone]) and he is agreeable. Further management will then depend on the endoscopic findings.   LOS: 0 days   Raymona Boss V  07/10/2017, 7:39 PM   Pager [phone] If no  answer or after 5 PM call [phone]

## 2017-07-10 NOTE — ED Triage Notes (Signed)
Pt. Here from [facility] facility for low hgb. Pt. At check up today and got her blood drawn. Pt. Has no complaints. Pt. Forgetful during triage. Pt. On 4L oxygen at all times. Pt. Skin color very pale. EDP at bedside for BP 79/52. IV and fluids initiated.

## 2017-07-10 NOTE — ED Notes (Signed)
Unable to obtain lab specimen at this time due to pt currently getting blood.

## 2017-07-10 NOTE — H&P (Signed)
History and Physical    Tiffany Maxwell QAS:[id number] DOB: October 26, 1934 DOA: 07/10/2017   PCP: [name], FNP   Attending physician: Marily Memos  Patient coming from/Resides with: Currently at [facility] for rehabilitation after surgery  Chief Complaint: Significant anemia and melena  HPI: Tiffany Maxwell is a 83 y.o. female with medical history significant for COPD, mild combined systolic and diastolic heart failure, hypertension, dyslipidemia. Patient was recently discharged on 7/14 after an admission for hip fracture requiring ORIF on 7/11. At time of discharge she was continued on Lovenox for DVT prophylaxis. Her preoperative hgb was 12.0. On date of discharge her hgb was 7.5. She was at her PCP office today for routine postoperative lab work and it was noted her hemoglobin was 5.5. After arrival to the ER she was noted to have melena and her stools were Hemoccult positive with a  hgb of 5.0. Her initial blood pressure was 79/52 but has improved to greater than 90 after normal saline bolus. 2 units of packed red blood cells have been ordered. She denied chest pain or shortness of breath or abdominal pain.  ED Course:  Vital Signs: BP (!) 74/46   Pulse 66   Temp 98.1 F (36.7 C) (Oral)   Resp 19   Ht 5\' 4"  (1.626 m)   Wt 45.4 kg (100 lb)   SpO2 93%   BMI 17.16 kg/m Lab data: Sodium 1:30, potassium 4.2, chloride 93, CO2 32, glucose 112, BUN 53, creatinine 0.55, calcium 8.8, anion gap 5, white count 7400 with hemoglobin 5, hematocrit 16.3, MCV 95.3, plate was 798,921, normal differential, FOB + Medications and treatments: Normal saline 1 L, Protonix 80 mg IV 1 dose  Review of Systems:  In addition to the HPI above,  No Fever-chills, myalgias or other constitutional symptoms No Headache, changes with Vision or hearing, new weakness, tingling, numbness in any extremity, dizziness, dysarthria or word finding difficulty, gait disturbance or imbalance, tremors or seizure  activity No problems swallowing food or Liquids, indigestion/reflux, choking or coughing while eating, abdominal pain with or after eating No Chest pain, Cough or Shortness of Breath, palpitations, orthopnea or DOE No Abdominal pain, N/V No dysuria, malodorous urine, hematuria or flank pain No new skin rashes, lesions, masses or bruises, No new joint pains, aches, swelling or redness No recent unintentional weight gain or loss No polyuria, polydypsia or polyphagia   Past Medical History:  Diagnosis Date  . CHF (congestive heart failure) ([place])   . Cholelithiasis 01/30/2013  . Compression fracture of lumbar spine, non-traumatic ([place]) 04/09/2014  . COPD (chronic obstructive pulmonary disease) ([place])   . Diastolic dysfunction 1/94/1740   Grade 1. Ejection fraction 65%.  . Hip fracture ([place]) 06/19/2017  . History of tobacco use 01/30/2013  . Hyperlipidemia   . Hypertension   . MI (myocardial infarction) ([place])    pt unaware  . Multiple fractures of thoracic spine, closed ([place]) 01/30/2013  . Osteoporosis   . PAC (premature atrial contraction)   . Palpitations   . Pulmonary emphysema ([place]) 01/30/2013  . Seborrheic keratosis     Past Surgical History:  Procedure Laterality Date  . COMPRESSION HIP SCREW Left 04/29/2013   Procedure: COMPRESSION HIP;  Surgeon: Sanjuana Kava, MD;  Location: [facility];  Service: Orthopedics;  Laterality: Left;  [place]  . FEMUR IM NAIL Right 06/20/2017   Procedure: INTRAMEDULLARY (IM) NAIL FEMORAL;  Surgeon: Rod Can, MD;  Location: [facility];  Service: Orthopedics;  Laterality: Right;  .  TUBAL LIGATION      Social History   Social History  . Marital status: Widowed    Spouse name: N/A  . Number of children: N/A  . Years of education: N/A   Occupational History  . Not on file.   Social History Main Topics  . Smoking status: Former Smoker    Packs/day: 1.00    Years: 40.00    Types: Cigarettes    Quit date: 07/10/2012  . Smokeless tobacco:  Former Systems developer  . Alcohol use No  . Drug use: No  . Sexual activity: No   Other Topics Concern  . Not on file   Social History Narrative  . No narrative on file    Mobility: Rolling walker Work history: Not obtained   Allergies  Allergen Reactions  . Codeine Other (See Comments)    headache  . Benicar Hct [Olmesartan Medoxomil-Hctz] Swelling and Rash  . Naproxen Itching, Rash and Other (See Comments)    Redness/flushing of skin    Family History  Problem Relation Age of Onset  . Hypertension Mother      Prior to Admission medications   Medication Sig Start Date End Date Taking? Authorizing Provider  aspirin EC 81 MG tablet Take 81 mg by mouth daily.    [provider]  benzonatate (TESSALON) 100 MG capsule TAKE 1 CAPSULE TWICE DAILY AS NEEDED FOR COUGH 06/07/17   Hassell Done, Mary-Margaret, FNP  Calcium Carbonate-Vitamin D (CALCIUM 600+D3) 600-400 MG-UNIT per tablet Take 1 tablet by mouth 2 (two) times daily. Patient taking differently: Take 1 tablet by mouth daily.  04/09/14   Rexene Alberts, MD  citalopram (CELEXA) 20 MG tablet Take 1 tablet (20 mg total) by mouth daily. 06/24/17   Mariel Aloe, MD  enoxaparin (LOVENOX) 40 MG/0.4ML injection Inject 0.4 mLs (40 mg total) into the skin daily. 06/21/17   Swinteck, Aaron Edelman, MD  feeding supplement, ENSURE ENLIVE, (ENSURE ENLIVE) LIQD Take 237 mLs by mouth 2 (two) times daily between meals. 06/24/17   Mariel Aloe, MD  ferrous sulfate 325 (65 FE) MG tablet Take 1 tablet (325 mg total) by mouth 2 (two) times daily with a meal. 06/23/17   Mariel Aloe, MD  HYDROcodone-acetaminophen (NORCO/VICODIN) 5-325 MG tablet Take 1-2 tablets by mouth every 6 (six) hours as needed for moderate pain. 06/21/17   Swinteck, Aaron Edelman, MD  Ipratropium-Albuterol (COMBIVENT RESPIMAT) 20-100 MCG/ACT AERS respimat Inhale 1 puff into the lungs every 6 (six) hours as needed for wheezing. 02/07/17   Hassell Done, Mary-Margaret, FNP  lisinopril (PRINIVIL,ZESTRIL)  40 MG tablet Take 1 tablet (40 mg total) by mouth daily. 02/07/17   Hassell Done, Mary-Margaret, FNP  polyethylene glycol powder (GLYCOLAX/MIRALAX) powder 17 GRAMS (1 CAPFUL) ONCE A DAY AS DIRECTED 07/03/16   [name], FNP  VENTOLIN HFA 108 (90 Base) MCG/ACT inhaler 2 PUFFS EVERY 6 HOURS AS NEEDED FOR WHEEZING OR SHORTNESS OF BREATH 04/03/17   [name], FNP    Physical Exam: Vitals:   07/10/17 1315 07/10/17 1345 07/10/17 1401 07/10/17 1415  BP: 96/62 (!) 110/39 (!) 111/51 (!) 74/46  Pulse:  66    Resp:  (!) 25 16 19   Temp:      TempSrc:      SpO2: (!) 82% 100% 93%   Weight:      Height:          Constitutional: NAD, calm, comfortable-Very pale in appearance Eyes: PERRL, lids normal, conjunctivae pale ENMT: Mucous membranes are dry. Posterior pharynx clear of  any exudate or lesions..  Neck: normal, supple, no masses, no thyromegaly Respiratory: clear to auscultation bilaterally, no wheezing, no crackles. Diminished in the bases Normal respiratory effort. No accessory muscle use.  Cardiovascular: Regular rate and rhythm, no murmurs / rubs / gallops. No extremity edema. 2+ pedal pulses. No carotid bruits. Blood pressure has been variable bouncing between mid 67H systolic to the low 419F systolic. Extremities cool to touch. Abdomen: no tenderness, no masses palpated. No hepatosplenomegaly. Bowel sounds positive.  Musculoskeletal: no clubbing / cyanosis. No joint deformity upper and lower extremities. Good ROM, no contractures. Normal muscle tone.  Skin: no rashes, lesions, ulcers. No induration-right hip surgical incision well-healed-evidence of old ecchymosis without contusion or hematoma on right hip Neurologic: CN 2-12 grossly intact. Sensation intact, DTR normal. Strength 5/5 x all 4 extremities.  Psychiatric: Alert and oriented x 3. Normal mood. Complaining of being cold.   Labs on Admission: I have personally reviewed following labs and imaging  studies  CBC:  Recent Labs Lab 07/10/17 1300  WBC 7.4  NEUTROABS 5.8  HGB 5.0*  HCT 16.3*  MCV 95.3  PLT 790   Basic Metabolic Panel:  Recent Labs Lab 07/10/17 1300  NA 130*  K 4.2  CL 93*  CO2 32  GLUCOSE 112*  BUN 53*  CREATININE 0.55  CALCIUM 8.8*   GFR: Estimated Creatinine Clearance: 39.5 mL/min (by C-G formula based on SCr of 0.55 mg/dL). Liver Function Tests: No results for input(s): AST, ALT, ALKPHOS, BILITOT, PROT, ALBUMIN in the last 168 hours. No results for input(s): LIPASE, AMYLASE in the last 168 hours. No results for input(s): AMMONIA in the last 168 hours. Coagulation Profile: No results for input(s): INR, PROTIME in the last 168 hours. Cardiac Enzymes: No results for input(s): CKTOTAL, CKMB, CKMBINDEX, TROPONINI in the last 168 hours. BNP (last 3 results) No results for input(s): PROBNP in the last 8760 hours. HbA1C: No results for input(s): HGBA1C in the last 72 hours. CBG: No results for input(s): GLUCAP in the last 168 hours. Lipid Profile: No results for input(s): CHOL, HDL, LDLCALC, TRIG, CHOLHDL, LDLDIRECT in the last 72 hours. Thyroid Function Tests: No results for input(s): TSH, T4TOTAL, FREET4, T3FREE, THYROIDAB in the last 72 hours. Anemia Panel: No results for input(s): VITAMINB12, FOLATE, FERRITIN, TIBC, IRON, RETICCTPCT in the last 72 hours. Urine analysis:    Component Value Date/Time   COLORURINE YELLOW 04/07/2014 2303   APPEARANCEUR CLEAR 04/07/2014 2303   LABSPEC 1.010 04/07/2014 2303   PHURINE 6.0 04/07/2014 2303   GLUCOSEU NEGATIVE 04/07/2014 2303   HGBUR NEGATIVE 04/07/2014 2303   BILIRUBINUR NEGATIVE 04/07/2014 2303   KETONESUR NEGATIVE 04/07/2014 2303   PROTEINUR NEGATIVE 04/07/2014 2303   UROBILINOGEN 0.2 04/07/2014 2303   NITRITE NEGATIVE 04/07/2014 2303   LEUKOCYTESUR NEGATIVE 04/07/2014 2303   Sepsis Labs: @LABRCNTIP (procalcitonin:4,lacticidven:4) )No results found for this or any previous visit (from the  past 240 hour(s)).   Radiological Exams on Admission: No results found.   Assessment/Plan Principal Problem:   Acute GI bleeding -Patient presents with symptomatic anemia and found to have heme positive melanotic stool and elevated BUN suggestive of upper GI bleeding but lower GI bleeding not entirely excluded -Discontinue DVT prophylaxis Lovenox -Hold preadmission aspirin -Gastroenterology consulted -Protonix 40 mg IV q 12 hrs -NPO -Admit to SDU  Active Problems:   Acute blood loss anemia -Likely a mixed pattern noting hemoglobin dropped almost 5 g in the immediate postop period and likely related to blood loss in the setting  of orthopedic surgery with possible undetected GI bleeding after initiation of Lovenox postoperatively -Now with melena as documented as above -Anemia panel and TSH -Transfuse 2 units PRBCs and repeat CBC after second unit-likely will require additional blood within the next 24 hours -Anticipate will become more hemodynamically stable after transfusion -Hold preadmission iron    HTN (hypertension), benign -Current blood pressure suboptimal in setting of active GI bleeding -Hold preadmission lisinopril    Chronic combined systolic (EF 89%) and diastolic CHF, NYHA class 1 -Currently compensated -Monitor for acute volume overload in the setting of administration of blood products although systolic function only mildly depressed based on echocardiogram from July 2018 -Daily weights and strict intake/output -ACE inhibitor on hold as above    Hyponatremia -Has chronic component with range between 128 and 135 -Likely influenced by dehydration -Current sodium 130    S/P right hip fracture/ORIF -Likely return to SNF for further rehabilitation -PT/OT    Hyperlipidemia -Not on medical therapy prior to admission    COPD (chronic obstructive pulmonary disease)  -Compensated -Continue preadmission Combivent and Ventolin      DVT prophylaxis: SCDs  Code  Status: DO NOT RESUSCITATE Family Communication: No family at bedside  Disposition Plan: SNF Consults called: Gastroenterology/[facility]    Samella Parr ANP-BC [facility] Pager [phone]   If 7PM-7AM, please contact night-coverage [email or web address] Password [facility]  07/10/2017, 2:31 PM

## 2017-07-10 NOTE — Progress Notes (Signed)
Pt arrived to the floor with the Nurse Alert and disoriented to place and situation. Pt placed on the phillips monitor, CCMD notified. We'll continue to monitor.

## 2017-07-10 NOTE — Progress Notes (Signed)
GI PRELIMINARY NOTE  Case discussed with Samella Parr, NP.  I will be seeing the patient a little bit later this evening. I believe she will need endoscopic evaluation, which would be appropriate to do tomorrow after she has had a chance to be transfused. In the meantime, I agree with the empiric use of pantoprazole for possible aspirin-induced ulcer disease.  Please call me at anytime if more immediate input is needed.  Cleotis Nipper, M.D. Pager [phone] If no answer or after 5 PM call [phone]

## 2017-07-10 NOTE — ED Provider Notes (Signed)
Medical screening examination/treatment/procedure(s) were conducted as a shared visit with non-physician practitioner(s) and myself.  I personally evaluated the patient during the encounter.   EKG Interpretation None      83yF with anemia. Reported hemoglobin 5.3. Will verify but probably accurate. She reports feeling weak. Very pale. Hypotensive. She is mentating well though and doesn't appear acutely ill.  Suspect GIB. Melena on exam. BUN:Cr high. Discharged on lovenox on 7/14 after hip surgery. Not clear from cursory review of record if she is still taking it. I couldn't locate a med list sent with her from facility. Hemoglobin was 7.5 on 06/23/17 prior to discharge. IVF bolus ordered initially for pressure. Will transfuse PRBCs. PPI for possible UGIB. She denies abdominal pain though and nontender. Admission.   CRITICAL CARE Performed by: Virgel Manifold Total critical care time: 40 minutes Critical care time was exclusive of separately billable procedures and treating other patients. Critical care was necessary to treat or prevent imminent or life-threatening deterioration. Critical care was time spent personally by me on the following activities: development of treatment plan with patient and/or surrogate as well as nursing, discussions with consultants, evaluation of patient's response to treatment, examination of patient, obtaining history from patient or surrogate, ordering and performing treatments and interventions, ordering and review of laboratory studies, ordering and review of radiographic studies, pulse oximetry and re-evaluation of patient's condition.    Virgel Manifold, MD 07/10/17 1345

## 2017-07-11 ENCOUNTER — Inpatient Hospital Stay (HOSPITAL_COMMUNITY): Payer: Medicare Other | Admitting: Anesthesiology

## 2017-07-11 ENCOUNTER — Encounter (HOSPITAL_COMMUNITY)
Admission: EM | Disposition: A | Discharge status: Skilled Nursing Facility | Payer: Self-pay | Source: Home / Self Care | Attending: Family Medicine

## 2017-07-11 ENCOUNTER — Encounter (HOSPITAL_COMMUNITY): Payer: Self-pay

## 2017-07-11 DIAGNOSIS — K922 Gastrointestinal hemorrhage, unspecified: Secondary | ICD-10-CM

## 2017-07-11 HISTORY — PX: ESOPHAGOGASTRODUODENOSCOPY (EGD) WITH PROPOFOL: SHX5813

## 2017-07-11 LAB — COMPREHENSIVE METABOLIC PANEL
ALBUMIN: 2.4 g/dL — AB (ref 3.5–5.0)
ALK PHOS: 57 U/L (ref 38–126)
ALT: 9 U/L — AB (ref 14–54)
ANION GAP: 3 — AB (ref 5–15)
AST: 19 U/L (ref 15–41)
BUN: 33 mg/dL — ABNORMAL HIGH (ref 6–20)
CALCIUM: 8.6 mg/dL — AB (ref 8.9–10.3)
CHLORIDE: 101 mmol/L (ref 101–111)
CO2: 33 mmol/L — AB (ref 22–32)
Creatinine, Ser: 0.33 mg/dL — ABNORMAL LOW (ref 0.44–1.00)
GFR calc non Af Amer: >60 mL/min (ref >60)
GLUCOSE: 103 mg/dL — AB (ref 65–99)
Potassium: 4.3 mmol/L (ref 3.5–5.1)
SODIUM: 137 mmol/L (ref 135–145)
Total Bilirubin: 0.9 mg/dL (ref 0.3–1.2)
Total Protein: 4.2 g/dL — ABNORMAL LOW (ref 6.5–8.1)

## 2017-07-11 LAB — TYPE AND SCREEN
ABO/RH(D): A POS
Antibody Screen: NEGATIVE
Unit division: 0
Unit division: 0

## 2017-07-11 LAB — IRON AND TIBC
IRON: 148 ug/dL (ref 28–170)
Saturation Ratios: 48 % — ABNORMAL HIGH (ref 10.4–31.8)
TIBC: 311 ug/dL (ref 250–450)
UIBC: 163 ug/dL

## 2017-07-11 LAB — RETICULOCYTES
RBC.: 2.79 MIL/uL — ABNORMAL LOW (ref 3.87–5.11)
RETIC COUNT ABSOLUTE: 170.2 10*3/uL (ref 19.0–186.0)
Retic Ct Pct: 6.1 % — ABNORMAL HIGH (ref 0.4–3.1)

## 2017-07-11 LAB — CBC
HEMATOCRIT: 24.1 % — AB (ref 36.0–46.0)
HEMOGLOBIN: 7.7 g/dL — AB (ref 12.0–15.0)
MCH: 28.4 pg (ref 26.0–34.0)
MCHC: 32 g/dL (ref 30.0–36.0)
MCV: 88.9 fL (ref 78.0–100.0)
Platelets: 205 10*3/uL (ref 150–400)
RBC: 2.71 MIL/uL — AB (ref 3.87–5.11)
RDW: 20 % — ABNORMAL HIGH (ref 11.5–15.5)
WBC: 7.2 10*3/uL (ref 4.0–10.5)

## 2017-07-11 LAB — BPAM RBC
Blood Product Expiration Date: 201808142359
Blood Product Expiration Date: 201808142359
ISSUE DATE / TIME: 201807311437
ISSUE DATE / TIME: 201807312349
UNIT TYPE AND RH: 6200
Unit Type and Rh: 6200

## 2017-07-11 LAB — VITAMIN B12: VITAMIN B 12: 238 pg/mL (ref 180–914)

## 2017-07-11 LAB — FOLATE: Folate: 15.6 ng/mL (ref >5.9)

## 2017-07-11 LAB — FERRITIN: FERRITIN: 110 ng/mL (ref 11–307)

## 2017-07-11 LAB — TSH: TSH: 0.652 u[IU]/mL (ref 0.350–4.500)

## 2017-07-11 SURGERY — ESOPHAGOGASTRODUODENOSCOPY (EGD) WITH PROPOFOL
Anesthesia: Monitor Anesthesia Care

## 2017-07-11 MED ORDER — PROPOFOL 10 MG/ML IV BOLUS
INTRAVENOUS | Status: DC | PRN
  Administered 2017-07-11 (×2): 30 mg via INTRAVENOUS

## 2017-07-11 MED ORDER — MEPERIDINE HCL 25 MG/ML IJ SOLN
6.2500 mg | INTRAMUSCULAR | Status: DC | PRN
  Filled 2017-07-11: qty 1

## 2017-07-11 MED ORDER — PANTOPRAZOLE SODIUM 40 MG PO TBEC
40.0000 mg | DELAYED_RELEASE_TABLET | Freq: Two times a day (BID) | ORAL | Status: DC
  Administered 2017-07-11 – 2017-07-13 (×4): 40 mg via ORAL
  Filled 2017-07-11 (×4): qty 1

## 2017-07-11 MED ORDER — MIDAZOLAM HCL 2 MG/2ML IJ SOLN: 0.5000 mg | Freq: Once | INTRAMUSCULAR | Status: DC | PRN

## 2017-07-11 MED ORDER — ONDANSETRON HCL 4 MG/2ML IJ SOLN
INTRAMUSCULAR | Status: DC | PRN
  Administered 2017-07-11: 4 mg via INTRAVENOUS

## 2017-07-11 MED ORDER — SUCRALFATE 1 GM/10ML PO SUSP
1.0000 g | Freq: Three times a day (TID) | ORAL | Status: DC
  Administered 2017-07-11 – 2017-07-13 (×7): 1 g via ORAL
  Filled 2017-07-11 (×6): qty 10

## 2017-07-11 MED ORDER — PROMETHAZINE HCL 25 MG/ML IJ SOLN: 6.2500 mg | INTRAMUSCULAR | Status: DC | PRN

## 2017-07-11 MED ORDER — LIDOCAINE 2% (20 MG/ML) 5 ML SYRINGE
INTRAMUSCULAR | Status: DC | PRN
  Administered 2017-07-11: 60 mg via INTRAVENOUS

## 2017-07-11 SURGICAL SUPPLY — 15 items

## 2017-07-11 NOTE — Transfer of Care (Signed)
Immediate Anesthesia Transfer of Care Note  Patient: Tiffany Maxwell  Procedure(s) Performed: Procedure(s): ESOPHAGOGASTRODUODENOSCOPY (EGD) WITH PROPOFOL (N/A)  Patient Location: PACU and Endoscopy Unit  Anesthesia Type:MAC  Level of Consciousness: awake, alert , oriented and patient cooperative  Airway & Oxygen Therapy: Patient Spontanous Breathing and Patient connected to nasal cannula oxygen  Post-op Assessment: Report given to RN, Post -op Vital signs reviewed and stable and Patient moving all extremities  Post vital signs: Reviewed and stable  Last Vitals:  Vitals:   07/11/17 1222 07/11/17 1349  BP: (!) (P) 115/55   Pulse: (P) 62 80  Resp: (P) 17 (!) 23  Temp: (P) 36.8 C     Last Pain:  Vitals:   07/11/17 1222  TempSrc: (P) Oral  PainSc:          Complications: No apparent anesthesia complications

## 2017-07-11 NOTE — Progress Notes (Signed)
Obtained telephone consent from patients son Renato Gails, who is also Power of Attorney. Telephone consent verified by two nurses: primary-Ashlea Dusing Hendricks Limes, RN, BSN and secondary Mell Forestine Na.

## 2017-07-11 NOTE — H&P (View-Only) (Signed)
Referring Provider:  Linna Darner, MD Primary Care Physician:  [name], FNP Primary Gastroenterologist:  None (unassigned)  Reason for Consultation:  GI bleeding  HPI: Tiffany Maxwell is a 83 y.o. female approximately 2 weeks status post surgery for hip fracture, maintained on Lovenox and also on aspirin prior to admission, who started having rectal bleeding and was brought from the rehabilitation facility to the emergency room, where her hemoglobin was 5.0 (MCV 95, platelets normal at 223,000). Her BUN was elevated at 53, consistent with upper tract bleeding.  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     Past Medical History:  Diagnosis Date  . CHF (congestive heart failure) ([place])   . Cholelithiasis 01/30/2013  . Compression fracture of lumbar spine, non-traumatic ([place]) 04/09/2014  . COPD (chronic obstructive pulmonary disease) ([place])   . Diastolic dysfunction 9/67/8938   Grade 1. Ejection fraction 65%.  . Hip fracture ([place]) 06/19/2017  . History of tobacco use 01/30/2013  . Hyperlipidemia   . Hypertension   . MI (myocardial infarction) ([place])    pt unaware  . Multiple fractures of thoracic spine, closed ([place]) 01/30/2013  . Osteoporosis   . PAC (premature atrial contraction)   . Palpitations   . Pulmonary emphysema ([place]) 01/30/2013  . Seborrheic keratosis     Past Surgical History:  Procedure Laterality Date  . COMPRESSION HIP SCREW Left 04/29/2013   Procedure: COMPRESSION HIP;  Surgeon: Sanjuana Kava, MD;  Location: [facility];  Service: Orthopedics;  Laterality: Left;  [place]  . FEMUR IM NAIL Right 06/20/2017   Procedure: INTRAMEDULLARY (IM) NAIL FEMORAL;  Surgeon: Rod Can, MD;  Location: [facility];  Service: Orthopedics;  Laterality: Right;  . TUBAL LIGATION      Prior to Admission medications   Medication Sig Start Date End Date Taking? Authorizing  Provider  aspirin EC 81 MG tablet Take 81 mg by mouth daily.   Yes [provider]  benzonatate (TESSALON) 100 MG capsule TAKE 1 CAPSULE TWICE DAILY AS NEEDED FOR COUGH Patient taking differently: Take 100 mg by mouth two times a day as needed for coughing 06/07/17  Yes Hassell Done, Mary-Margaret, FNP  Calcium Carbonate-Vitamin D (CALCIUM 600+D3) 600-400 MG-UNIT per tablet Take 1 tablet by mouth 2 (two) times daily. 04/09/14  Yes Rexene Alberts, MD  citalopram (CELEXA) 20 MG tablet Take 1 tablet (20 mg total) by mouth daily. 06/24/17  Yes Mariel Aloe, MD  enoxaparin (LOVENOX) 40 MG/0.4ML injection Inject 0.4 mLs (40 mg total) into the skin daily. Patient taking differently: Inject 40 mg into the skin daily. END DATE: 07/23/17 06/21/17  Yes Swinteck, Aaron Edelman, MD  feeding supplement, ENSURE ENLIVE, (ENSURE ENLIVE) LIQD Take 237 mLs by mouth 2 (two) times daily between meals. 06/24/17  Yes Mariel Aloe, MD  ferrous sulfate 325 (65 FE) MG tablet Take 1 tablet (325 mg total) by mouth 2 (two) times daily with a meal. 06/23/17  Yes Mariel Aloe, MD  HYDROcodone-acetaminophen (NORCO/VICODIN) 5-325 MG tablet Take 1-2 tablets by mouth every 6 (six) hours as needed for moderate pain. Patient taking differently: Take 1-2 tablets by mouth every 6 (six) hours as needed for moderate pain or severe pain.  06/21/17  Yes Swinteck, Aaron Edelman, MD  Ipratropium-Albuterol (COMBIVENT RESPIMAT) 20-100 MCG/ACT AERS respimat Inhale 1 puff into the lungs every 6 (six) hours as needed for wheezing. 02/07/17  Yes  Hassell Done, Mary-Margaret, FNP  lisinopril (PRINIVIL,ZESTRIL) 40 MG tablet Take 1 tablet (40 mg total) by mouth daily. 02/07/17  Yes Martin, Mary-Margaret, FNP  OXYGEN Inhale 4 L into the lungs See admin instructions. TO MAINTAIN SATS OF 93% OR GREATER   Yes [provider]  polyethylene glycol powder (GLYCOLAX/MIRALAX) powder 17 GRAMS (1 CAPFUL) ONCE A DAY AS DIRECTED Patient taking differently: Mix and drink once a  day: 17 grams of powder into 6-8 ounces of juice or water 07/03/16  Yes Martin, Mary-Margaret, FNP  VENTOLIN HFA 108 (90 Base) MCG/ACT inhaler 2 PUFFS EVERY 6 HOURS AS NEEDED FOR WHEEZING OR SHORTNESS OF BREATH Patient taking differently: Inhale 2 puffs into the lungs every 6 hours as needed for wheezing or shortness of breath 04/03/17  Yes Hassell Done, Mary-Margaret, FNP  vitamin C (ASCORBIC ACID) 500 MG tablet Take 500 mg by mouth 2 (two) times daily.   Yes [provider]    Current Facility-Administered Medications  Medication Dose Route Frequency Provider Last Rate Last Dose  . ipratropium-albuterol (DUONEB) 0.5-2.5 (3) MG/3ML nebulizer solution 3 mL  3 mL Nebulization Once Recardo Evangelist, PA-C      . pantoprazole (PROTONIX) injection 40 mg  40 mg Intravenous Q12H Samella Parr, NP       Current Outpatient Prescriptions  Medication Sig Dispense Refill  . aspirin EC 81 MG tablet Take 81 mg by mouth daily.    . benzonatate (TESSALON) 100 MG capsule TAKE 1 CAPSULE TWICE DAILY AS NEEDED FOR COUGH (Patient taking differently: Take 100 mg by mouth two times a day as needed for coughing) 20 capsule 0  . Calcium Carbonate-Vitamin D (CALCIUM 600+D3) 600-400 MG-UNIT per tablet Take 1 tablet by mouth 2 (two) times daily.    . citalopram (CELEXA) 20 MG tablet Take 1 tablet (20 mg total) by mouth daily.    Marland Kitchen enoxaparin (LOVENOX) 40 MG/0.4ML injection Inject 0.4 mLs (40 mg total) into the skin daily. (Patient taking differently: Inject 40 mg into the skin daily. END DATE: 07/23/17) 30 Syringe 0  . feeding supplement, ENSURE ENLIVE, (ENSURE ENLIVE) LIQD Take 237 mLs by mouth 2 (two) times daily between meals.    . ferrous sulfate 325 (65 FE) MG tablet Take 1 tablet (325 mg total) by mouth 2 (two) times daily with a meal.    . HYDROcodone-acetaminophen (NORCO/VICODIN) 5-325 MG tablet Take 1-2 tablets by mouth every 6 (six) hours as needed for moderate pain. (Patient taking differently: Take 1-2  tablets by mouth every 6 (six) hours as needed for moderate pain or severe pain. ) 30 tablet 0  . Ipratropium-Albuterol (COMBIVENT RESPIMAT) 20-100 MCG/ACT AERS respimat Inhale 1 puff into the lungs every 6 (six) hours as needed for wheezing. 8 g 5  . lisinopril (PRINIVIL,ZESTRIL) 40 MG tablet Take 1 tablet (40 mg total) by mouth daily. 90 tablet 1  . OXYGEN Inhale 4 L into the lungs See admin instructions. TO MAINTAIN SATS OF 93% OR GREATER    . polyethylene glycol powder (GLYCOLAX/MIRALAX) powder 17 GRAMS (1 CAPFUL) ONCE A DAY AS DIRECTED (Patient taking differently: Mix and drink once a day: 17 grams of powder into 6-8 ounces of juice or water) 527 g 2  . VENTOLIN HFA 108 (90 Base) MCG/ACT inhaler 2 PUFFS EVERY 6 HOURS AS NEEDED FOR WHEEZING OR SHORTNESS OF BREATH (Patient taking differently: Inhale 2 puffs into the lungs every 6 hours as needed for wheezing or shortness of breath) 18 g 3  . vitamin  C (ASCORBIC ACID) 500 MG tablet Take 500 mg by mouth 2 (two) times daily.      Allergies as of 07/10/2017 - Review Complete 07/10/2017  Allergen Reaction Noted  . Codeine Other (See Comments) 01/28/2013  . Benicar hct [olmesartan medoxomil-hctz] Swelling and Rash 03/27/2013  . Hctz [hydrochlorothiazide] Swelling and Rash 07/10/2017  . Naproxen Itching, Rash, and Other (See Comments) 01/28/2013    Family History  Problem Relation Age of Onset  . Hypertension Mother     Social History   Social History  . Marital status: Widowed    Spouse name: N/A  . Number of children: N/A  . Years of education: N/A   Occupational History  . Not on file.   Social History Main Topics  . Smoking status: Former Smoker    Packs/day: 1.00    Years: 40.00    Types: Cigarettes    Quit date: 07/10/2012  . Smokeless tobacco: Former Systems developer  . Alcohol use No  . Drug use: No  . Sexual activity: No   Other Topics Concern  . Not on file   Social History Narrative  . No narrative on file      Physical  Exam: Vital signs in last 24 hours: Temp:  [97.6 F (36.4 C)-98.6 F (37 C)] 98.6 F (37 C) (07/31 1840) Pulse Rate:  [62-73] 73 (07/31 1840) Resp:  [15-25] 17 (07/31 1840) BP: (74-113)/(37-99) 102/45 (07/31 1840) SpO2:  [82 %-100 %] 100 % (07/31 1815) Weight:  [45.4 kg (100 lb)] 45.4 kg (100 lb) (07/31 1246)  This is an elderly, rather frail appearing patient with a smoker's appearance, in no distress, slightly confused (thinks the year is 2048, but knows her age). She is alert. She does not appear at all shocky. Conjunctivae  are very pale, skin is somewhat pale but warm and well perfused. No peripheral edema. Chest without rales or wheezes, probably some decrease in vesicular breath sounds. Heart sounds are distant but no murmur or arrhythmia is appreciated. Abdomen is scaphoid, without mass or tenderness.   Intake/Output from previous day: No intake/output data recorded. Intake/Output this shift: No intake/output data recorded.  Lab Results:  Recent Labs  07/10/17 1300  WBC 7.4  HGB 5.0*  HCT 16.3*  PLT 223   BMET  Recent Labs  07/10/17 1300  NA 130*  K 4.2  CL 93*  CO2 32  GLUCOSE 112*  BUN 53*  CREATININE 0.55  CALCIUM 8.8*   LFT No results for input(s): PROT, ALBUMIN, AST, ALT, ALKPHOS, BILITOT, BILIDIR, IBILI in the last 72 hours. PT/INR No results for input(s): LABPROT, INR in the last 72 hours.  Studies/Results: No results found.  Impression: Upper GI bleed, presumably aspirin-induced ulceration augmented by Lovenox treatment since her hip fracture surgery.  Plan: Agree with transfusions and pantoprazole as already ordered. Monitor labs. Endoscopy tomorrow. I discussed the nature, purpose, risks, and alternative (of observation) with the patient's son, Oluchi Pucci [phone]) and he is agreeable. Further management will then depend on the endoscopic findings.   LOS: 0 days   Yobana Culliton V  07/10/2017, 7:39 PM   Pager [phone] If no  answer or after 5 PM call [phone]

## 2017-07-11 NOTE — Progress Notes (Signed)
PROGRESS NOTE    Tiffany Maxwell  XLK:[id number] DOB: 03/16/1934 DOA: 07/10/2017 PCP: [name], FNP    Brief Narrative:   83 y.o. female who presents with symptomatic anemia likely secondary to comminution of acute blood loss from recent surgery and gastrointestinal bleed   Assessment & Plan:   Principal Problem:   Acute GI bleeding - Patient had workup by gastroenterologist and endoscopy showed duodenal ulcer. Patient is low risk of rebleeding given clean-based ulcer per my discussion with GI specialist. However aspirin should be held for 2-4 weeks. And consideration should be given for lifelong PPI.   Active Problems:   S/P right hip fracture/ORIF - We'll plan on continuing Lovenox next a.m.    Hyperlipidemia   HTN (hypertension), benign - Stable   Chronic combined systolic (EF 36%) and diastolic CHF, NYHA class 1 (HCC)   COPD (chronic obstructive pulmonary disease) (HCC) - Stable    Hyponatremia - resolved on last check. Most likely due to low oral solute intake  DVT prophylaxis: SCD's Code Status: DNR Family Communication: no family at bedside Disposition Plan: pending improvement in condition   Consultants:   GI   Procedures: Endoscopy: EGD   Antimicrobials: None   Subjective: The patient has no new complaints. No acute issues overnight  Objective: Vitals:   07/11/17 1357 07/11/17 1405 07/11/17 1459 07/11/17 1728  BP: 111/78 101/60 127/66 119/62  Pulse: 75 70 (!) 56 70  Resp: 17 (!) 25 16   Temp: 98.6 F (37 C)  98.2 F (36.8 C)   TempSrc: Oral  Oral   SpO2: 97% 99% 100% 100%  Weight:      Height:        Intake/Output Summary (Last 24 hours) at 07/11/17 1818 Last data filed at 07/11/17 1700  Gross per 24 hour  Intake             1115 ml  Output                0 ml  Net             1115 ml   Filed Weights   07/10/17 2150 07/11/17 0558 07/11/17 1222  Weight: 50.3 kg (110 lb 14.3 oz) 50.3 kg (110 lb 14.3 oz) 49.9 kg (110 lb)      Examination:  General exam: Appears calm and comfortable, in nad. Respiratory system: Clear to auscultation. Respiratory effort normal. Cardiovascular system: S1 & S2 heard, RRR. No JVD, murmurs, rubs, gallops  Gastrointestinal system: Abdomen is nondistended, soft and nontender.  Central nervous system: Alert and oriented. No focal neurological deficits. Extremities: Symmetric 5 x 5 power. Skin: No rashes, lesions or ulcers, on limited exam. Psychiatry:  Mood & affect appropriate.     Data Reviewed: I have personally reviewed following labs and imaging studies  CBC:  Recent Labs Lab 07/10/17 1300 07/11/17 0501  WBC 7.4 7.2  NEUTROABS 5.8  --   HGB 5.0* 7.7*  HCT 16.3* 24.1*  MCV 95.3 88.9  PLT 223 644   Basic Metabolic Panel:  Recent Labs Lab 07/10/17 1300 07/10/17 2219 07/11/17 0501  NA 130*  --  137  K 4.2  --  4.3  CL 93*  --  101  CO2 32  --  33*  GLUCOSE 112*  --  103*  BUN 53*  --  33*  CREATININE 0.55  --  0.33*  CALCIUM 8.8*  --  8.6*  MG  --  2.0  --  PHOS  --  2.9  --    GFR: Estimated Creatinine Clearance: 43.4 mL/min (A) (by C-G formula based on SCr of 0.33 mg/dL (L)). Liver Function Tests:  Recent Labs Lab 07/11/17 0501  AST 19  ALT 9*  ALKPHOS 57  BILITOT 0.9  PROT 4.2*  ALBUMIN 2.4*   No results for input(s): LIPASE, AMYLASE in the last 168 hours. No results for input(s): AMMONIA in the last 168 hours. Coagulation Profile: No results for input(s): INR, PROTIME in the last 168 hours. Cardiac Enzymes: No results for input(s): CKTOTAL, CKMB, CKMBINDEX, TROPONINI in the last 168 hours. BNP (last 3 results) No results for input(s): PROBNP in the last 8760 hours. HbA1C: No results for input(s): HGBA1C in the last 72 hours. CBG: No results for input(s): GLUCAP in the last 168 hours. Lipid Profile: No results for input(s): CHOL, HDL, LDLCALC, TRIG, CHOLHDL, LDLDIRECT in the last 72 hours. Thyroid Function Tests:  Recent Labs   07/11/17 0501  TSH 0.652   Anemia Panel:  Recent Labs  07/11/17 0501  VITAMINB12 238  FOLATE 15.6  FERRITIN 110  TIBC 311  IRON 148  RETICCTPCT 6.1*   Sepsis Labs: No results for input(s): PROCALCITON, LATICACIDVEN in the last 168 hours.  No results found for this or any previous visit (from the past 240 hour(s)).    Radiology Studies: No results found.   Scheduled Meds: . ipratropium-albuterol  3 mL Nebulization Once  . pantoprazole  40 mg Oral BID AC  . sodium chloride flush  3 mL Intravenous Q12H  . sucralfate  1 g Oral TID WC & HS   Continuous Infusions:   LOS: 1 day    Time spent: > 35 minutes  Velvet Bathe, MD [facility] Pager [phone]  If 7PM-7AM, please contact night-coverage [email or web address] Password [email or web address] 07/11/2017, 6:18 PM

## 2017-07-11 NOTE — Progress Notes (Signed)
Patient's EGD shows a clean-based duodenal ulcer, which is the presumed source of her recent bleeding.  Recommendations:  1. Please see procedure report for more details 2. I have ordered a solid diet for patient and have changed her pantoprazole to oral. 3. Patient appears to be at low risk for further bleeding, so resumption of Lovenox is acceptable if it is felt to be needed for the patient 4. It would be best if this patient were kept off aspirin for 2-4 weeks (depending on the perceived degree of need for that medication), to allow time for ulcer healing. Thereafter, if it is felt to be important for the patient to be back on aspirin, it could be done with reasonable safety as long as the patient were maintained on lifelong PPI prophylaxis. 5. The patient needs a minimum of one month of PPI therapy. 6. While in the hospital, I would favor twice-daily PPI; upon discharge, once daily dosing should be sufficient. 7. I have started the patient on sucralfate to help "patch" the ulcer. However, I do not feel that this medication needs to be continued post discharge. 8. I would probably observe the patient at least 1 more night in the hospital to make sure that we are "out of the woods" with respect to recurrent bleeding risk. 9. Hopefully her gastric biopsies will be ready tomorrow; if Helicobacter pylori infection is present, I would favor treating it to help prevent future ulcer recurrence.  Cleotis Nipper, M.D. Pager [phone] If no answer or after 5 PM call [phone]

## 2017-07-11 NOTE — Progress Notes (Signed)
Initial Nutrition Assessment  DOCUMENTATION CODES:   Severe malnutrition in context of chronic illness  INTERVENTION:  - Diet advancement as medically feasible. - Recommend multivitamin with minerals, if able to take oral medications. - RD will monitor for needs and provide interventions as appropriate at time of follow-up.  NUTRITION DIAGNOSIS:   Malnutrition (severe) related to chronic illness (COPD, heart disease) as evidenced by severe depletion of muscle mass, severe depletion of body fat.  GOAL:   Patient will meet greater than or equal to 90% of their needs  MONITOR:   Diet advancement, Weight trends, Labs  REASON FOR ASSESSMENT:    (Underweight BMI (17.2 kg/m2) on admission)  ASSESSMENT:   83 y.o. female with medical history significant for COPD, mild combined systolic and diastolic heart failure, hypertension, dyslipidemia. Patient was recently discharged on 7/14 after an admission for hip fracture requiring ORIF on 7/11. At time of discharge she was continued on Lovenox for DVT prophylaxis. Her preoperative hgb was 12.0. On date of discharge her hgb was 7.5. She was at her PCP office today for routine postoperative lab work and it was noted her hemoglobin was 5.5. After arrival to the ER she was noted to have melena and her stools were Hemoccult positive with a  hgb of 5.0. Her initial blood pressure was 79/52 but has improved to greater than 90 after normal saline bolus. 2 units of packed red blood cells have been ordered. She denied chest pain or shortness of breath or abdominal pain.  BMI currently indicates normal weight. Weight on admission was 100 lbs which was consistent with weight on 06/20/17. Current weight is 110 lbs. Question if increase in weight is fluid related; will continue to monitor closely. Pt currently out of the room to EGD and no family/visitors present in pt's room. She was seen by another RD on 7/11; that note indicates pt was a poor historian but that  she reported good appetite PTA and that she likes Ensure supplements. Physical assessment done at that time showed severe muscle and fat wasting to all areas; suspect that this would still be accurate.   Medications reviewed; 80 mg IV Protonix x1 dose yesterday.  Labs reviewed;  BUN: 33 mg/dL, creatinine: 0.33 mg/dL, Ca: 8.6 mg/dL.    Diet Order:  Diet NPO time specified  Skin:  Reviewed, no issues (R hip incision 06/20/17 s/p R hip fx)  Last BM:  PTA/unknown  Height:   Ht Readings from Last 1 Encounters:  07/10/17 5\' 4"  (1.626 m)    Weight:   Wt Readings from Last 1 Encounters:  07/11/17 110 lb 14.3 oz (50.3 kg)    Ideal Body Weight:  54.54 kg  BMI:  Body mass index is 19.03 kg/m.  Estimated Nutritional Needs:   Kcal:  1365-1590 (30-35 kcal/kg)  Protein:  45-55 grams (1-1.2 grams/kg)  Fluid:  >/= 1.4 L/day  EDUCATION NEEDS:   No education needs identified at this time    Jarome Matin, MS, RD, LDN, CNSC Inpatient Clinical Dietitian Pager # [phone] After hours/weekend pager # [phone]

## 2017-07-11 NOTE — Op Note (Signed)
[facility] Patient Name: Tiffany Maxwell Procedure Date : 07/11/2017 MRN: [id number] Attending MD: Ronald Lobo , MD Date of Birth: 26-May-1934 CSN: [id number] Age: 83 Admit Type: Inpatient Procedure:                Upper GI endoscopy Indications:              Melena in a patient on aspirin, and recently placed                            on Lovenox following hip fracture repair. Providers:                Ronald Lobo, MD, Cleda Daub, RN, Corliss Parish, Technician Referring MD:              Medicines:                Monitored Anesthesia Care Complications:            No immediate complications. Estimated Blood Loss:     Estimated blood loss was minimal. Procedure:                Pre-Anesthesia Assessment:                           - Prior to the procedure, a History and Physical                            was performed, and patient medications and                            allergies were reviewed. The patient's tolerance of                            previous anesthesia was also reviewed. The risks                            and benefits of the procedure and the sedation                            options and risks were discussed with the patient.                            All questions were answered, and informed consent                            was obtained. Prior Anticoagulants: The patient                            last took aspirin 1 day and Lovenox (enoxaparin) 1                            day prior to the procedure. ASA Grade Assessment:  III - A patient with severe systemic disease. After                            reviewing the risks and benefits, the patient was                            deemed in satisfactory condition to undergo the                            procedure.                           After obtaining informed consent, the endoscope was                            passed under direct  vision. Throughout the                            procedure, the patient's blood pressure, pulse, and                            oxygen saturations were monitored continuously. The                            [id number] ([id number]) scope was introduced through the                            mouth, and advanced to the second part of duodenum.                            The upper GI endoscopy was accomplished without                            difficulty. The patient tolerated the procedure                            well. Scope In: Scope Out: Findings:      A widely patent, non-obstructing and mild Schatzki ring (acquired) was       found at the gastroesophageal junction.      A 3 cm hiatal hernia was present.      The exam of the esophagus was otherwise normal.      The entire examined stomach was normal. Biopsies were taken with a cold       forceps for histology.      The cardia and gastric fundus were normal on retroflexion.      One non-bleeding cratered duodenal ulcer with no stigmata of bleeding       was found in the duodenal bulb. The lesion was 10 mm in largest       dimension. Impression:               - no active bleeding or blood in the stomach at the                            time of this procedure.                           -  Widely patent, non-obstructing and mild Schatzki                            ring.                           - 3 cm hiatal hernia.                           - Normal stomach. Biopsied.                           - One non-bleeding duodenal ulcer with no stigmata                            of bleeding. This is the presumed source of the                            patient's melena, although no stigma of hemorrhage                            is present. No interventions were performed. Moderate Sedation:      This patient was sedated with monitored anesthesia care, not moderate       sedation. Recommendation:           - Await pathology results. Treat  Helicobacter                            pylori if positive.                           - Based on the clean based appearance of this                            ulcer, resumption of anticoagulation is acceptable                            and would put the patient at low risk for                            rebleeding.                           - Would keep the patient off aspirin for 2-4 weeks                            to allow ulcer healing. Thereafter, if felt to be                            important for the patient to be on aspirin, it                            could be resumed with recent safety as long as the  patient remains on lifelong PPI prophylaxis.                           - Resume regular diet today.                           - Continue present medications. Procedure Code(s):        --- Professional ---                           [id number], Esophagogastroduodenoscopy, flexible,                            transoral; with biopsy, single or multiple Diagnosis Code(s):        --- Professional ---                           K26.9, Duodenal ulcer, unspecified as acute or                            chronic, without hemorrhage or perforation                           K92.1, Melena (includes Hematochezia) CPT copyright 2016 American Medical Association. All rights reserved. The codes documented in this report are preliminary and upon coder review may  be revised to meet current compliance requirements. Ronald Lobo, MD 07/11/2017 2:03:33 PM This report has been signed electronically. Number of Addenda: 0

## 2017-07-11 NOTE — Progress Notes (Signed)
Off unit to Endoscopy.

## 2017-07-11 NOTE — Anesthesia Preprocedure Evaluation (Addendum)
Anesthesia Evaluation  Patient identified by MRN, date of birth, ID band Patient awake and Patient confused    Reviewed: Allergy & Precautions, NPO status , Patient's Chart, lab work & pertinent test results  History of Anesthesia Complications Negative for: history of anesthetic complications  Airway Mallampati: II  TM Distance: >3 FB Neck ROM: Full    Dental  (+) Edentulous Upper, Edentulous Lower   Pulmonary COPD,  COPD inhaler and oxygen dependent, former smoker (quit 2013),    breath sounds clear to auscultation       Cardiovascular hypertension,  Rhythm:Regular Rate:Normal  06/20/17 ECHO: EF 40%, valves OK   Neuro/Psych Anxiety Back pain    GI/Hepatic negative GI ROS, Neg liver ROS,   Endo/Other  negative endocrine ROS  Renal/GU negative Renal ROS     Musculoskeletal   Abdominal   Peds  Hematology  (+) Blood dyscrasia (Hb 7.7 ), anemia ,   Anesthesia Other Findings   Reproductive/Obstetrics                            Anesthesia Physical Anesthesia Plan  ASA: III  Anesthesia Plan: MAC   Post-op Pain Management:    Induction:   PONV Risk Score and Plan: 2 and Ondansetron and Treatment may vary due to age or medical condition  Airway Management Planned: Natural Airway and Nasal Cannula  Additional Equipment:   Intra-op Plan:   Post-operative Plan:   Informed Consent: I have reviewed the patients History and Physical, chart, labs and discussed the procedure including the risks, benefits and alternatives for the proposed anesthesia with the patient or authorized representative who has indicated his/her understanding and acceptance.     Plan Discussed with: CRNA and Surgeon  Anesthesia Plan Comments: (Plan routine monitors, MAC)        Anesthesia Quick Evaluation

## 2017-07-11 NOTE — Interval H&P Note (Signed)
History and Physical Interval Note:  07/11/2017 1:42 PM  Tiffany Maxwell  has presented today for surgery, with the diagnosis of Gastrointestinal bleeding  The various methods of treatment have been discussed with the patient and family. After consideration of risks, benefits and other options for treatment, the patient has consented to  Procedure(s): ESOPHAGOGASTRODUODENOSCOPY (EGD) WITH PROPOFOL (N/A) as a surgical intervention .  The patient's history has been reviewed, patient examined, no change in status, stable for surgery.  I have reviewed the patient's chart and labs.  Questions were answered to the patient's satisfaction.     Cleotis Nipper

## 2017-07-11 NOTE — Evaluation (Signed)
Physical Therapy Evaluation Patient Details Name: Tiffany Maxwell MRN: [id number] DOB: 1934-02-29 Today's Date: 07/11/2017   History of Present Illness  83 y.o. female admitted with amemia from GI bleed and previous 06/20/17 R  intertrochanteric femur fx s/p IM Nail with post op TDWB 30%.  Pt with significant PMH of emphysema, PAC, thoracic spine fxs, MI, HTN, COPD, compression fx of lumbar spine, CHF, L compression hip screw.    Clinical Impression  Pt admitted with above diagnosis. Pt currently with functional limitations due to the deficits listed below (see PT Problem List). Pt currently, modA for bed mobility and transfers from bed to recliner. Pt educated on R LE PWB and requires constant verbal cuing to maintain.  Pt will benefit from skilled PT to increase their independence and safety with mobility to allow discharge to the venue listed below.       Follow Up Recommendations SNF    Equipment Recommendations  None recommended by PT    Recommendations for Other Services       Precautions / Restrictions Precautions Precautions: Fall Restrictions Weight Bearing Restrictions: Yes RLE Weight Bearing: Partial weight bearing RLE Partial Weight Bearing Percentage or Pounds: 30      Mobility  Bed Mobility Overal bed mobility: Needs Assistance Bed Mobility: Supine to Sit     Supine to sit: Mod assist;HOB elevated     General bed mobility comments: modA to bring LE to floor and pad scoot hips to EoB  Transfers Overall transfer level: Needs assistance Equipment used: None Transfers: Sit to/from Stand Sit to Stand: Mod assist Stand pivot transfers: Mod assist       General transfer comment: Mod A for controlling posterior lean, vc for maintaining PWB through R LE, PT foot under pt R foot to assess pressure      Balance Overall balance assessment: Needs assistance Sitting-balance support: Feet supported;Bilateral upper extremity supported Sitting balance-Leahy Scale:  Poor     Standing balance support: Bilateral upper extremity supported Standing balance-Leahy Scale: Poor                               Pertinent Vitals/Pain Pain Assessment: No/denies pain    Home Living Family/patient expects to be discharged to:: Skilled nursing facility                      Prior Function Level of Independence: Needs assistance         Comments: came from SNF where she was rehabing from R hip fx, pt unable to communicate how much mobility she had at the SNF     Hand Dominance        Extremity/Trunk Assessment   Upper Extremity Assessment Upper Extremity Assessment: Generalized weakness    Lower Extremity Assessment RLE Deficits / Details: R leg assessed in supine with grossly 3/5  strength LLE Deficits / Details: left leg with at least 4/5 strength as grossly assessed during mobility.     Cervical / Trunk Assessment Cervical / Trunk Assessment: Kyphotic  Communication   Communication: HOH  Cognition Arousal/Alertness: Awake/alert Behavior During Therapy: WFL for tasks assessed/performed Overall Cognitive Status: No family/caregiver present to determine baseline cognitive functioning Area of Impairment: Memory;Problem solving;Safety/judgement;Orientation                 Orientation Level: Disoriented to;Place;Time;Situation   Memory: Decreased recall of precautions   Safety/Judgement: Decreased awareness of safety   Problem  Solving: Slow processing;Requires verbal cues;Requires tactile cues        General Comments General comments (skin integrity, edema, etc.): Pt on 4L O2 via nasal cannula, SaO2 throughout session >92%O2, VSS        Assessment/Plan    PT Assessment Patient needs continued PT services  PT Problem List Decreased strength;Decreased range of motion;Decreased activity tolerance;Decreased balance;Decreased mobility;Decreased cognition;Decreased knowledge of use of DME;Decreased safety  awareness;Decreased knowledge of precautions       PT Treatment Interventions DME instruction;Gait training;Functional mobility training;Therapeutic activities;Therapeutic exercise;Balance training;Cognitive remediation;Patient/family education    PT Goals (Current goals can be found in the Care Plan section)  Acute Rehab PT Goals Patient Stated Goal: non stated PT Goal Formulation: With patient Time For Goal Achievement: 07/18/17 Potential to Achieve Goals: Fair    Frequency Min 2X/week    AM-PAC PT "6 Clicks" Daily Activity  Outcome Measure Difficulty turning over in bed (including adjusting bedclothes, sheets and blankets)?: Total Difficulty moving from lying on back to sitting on the side of the bed? : Total Difficulty sitting down on and standing up from a chair with arms (e.g., wheelchair, bedside commode, etc,.)?: Total Help needed moving to and from a bed to chair (including a wheelchair)?: A Lot Help needed walking in hospital room?: Total Help needed climbing 3-5 steps with a railing? : Total 6 Click Score: 7    End of Session Equipment Utilized During Treatment: Gait belt;Oxygen Activity Tolerance: Patient tolerated treatment well Patient left: in chair;with call bell/phone within reach;with chair alarm set Nurse Communication: Mobility status PT Visit Diagnosis: Muscle weakness (generalized) (M62.81);Other abnormalities of gait and mobility (R26.89);History of falling (Z91.81);Difficulty in walking, not elsewhere classified (R26.2) Pain - Right/Left: Right Pain - part of body: Leg    Time: 6734-1937 PT Time Calculation (min) (ACUTE ONLY): 22 min   Charges:   PT Evaluation $PT Eval Moderate Complexity: 1 Mod     PT G Codes:        Clarkson Rosselli B. Migdalia Dk PT, DPT Acute Rehabilitation  [phone] Pager [phone]    [place] 07/11/2017, 10:42 AM

## 2017-07-11 NOTE — Anesthesia Postprocedure Evaluation (Signed)
Anesthesia Post Note  Patient: Tiffany Maxwell  Procedure(s) Performed: Procedure(s) (LRB): ESOPHAGOGASTRODUODENOSCOPY (EGD) WITH PROPOFOL (N/A)     Patient location during evaluation: Endoscopy Anesthesia Type: MAC Level of consciousness: awake and alert and patient cooperative Pain management: pain level controlled Vital Signs Assessment: post-procedure vital signs reviewed and stable Respiratory status: nonlabored ventilation, spontaneous breathing, respiratory function stable and patient connected to nasal cannula oxygen Cardiovascular status: blood pressure returned to baseline and stable Postop Assessment: no signs of nausea or vomiting Anesthetic complications: no    Last Vitals:  Vitals:   07/11/17 1349 07/11/17 1357  BP: (!) 102/33 111/78  Pulse: 80 75  Resp: (!) 23 17  Temp:  37 C    Last Pain:  Vitals:   07/11/17 1357  TempSrc: Oral  PainSc:                  Jaymere Alen,E. Carrington Mullenax

## 2017-07-12 LAB — CBC
HEMATOCRIT: 24.3 % — AB (ref 36.0–46.0)
HEMOGLOBIN: 7.6 g/dL — AB (ref 12.0–15.0)
MCH: 28.6 pg (ref 26.0–34.0)
MCHC: 31.3 g/dL (ref 30.0–36.0)
MCV: 91.4 fL (ref 78.0–100.0)
Platelets: 203 10*3/uL (ref 150–400)
RBC: 2.66 MIL/uL — ABNORMAL LOW (ref 3.87–5.11)
RDW: 20.9 % — ABNORMAL HIGH (ref 11.5–15.5)
WBC: 6.8 10*3/uL (ref 4.0–10.5)

## 2017-07-12 LAB — BASIC METABOLIC PANEL
ANION GAP: 4 — AB (ref 5–15)
BUN: 20 mg/dL (ref 6–20)
CHLORIDE: 104 mmol/L (ref 101–111)
CO2: 33 mmol/L — AB (ref 22–32)
Calcium: 8.9 mg/dL (ref 8.9–10.3)
Creatinine, Ser: 0.31 mg/dL — ABNORMAL LOW (ref 0.44–1.00)
GFR calc non Af Amer: >60 mL/min (ref >60)
GLUCOSE: 88 mg/dL (ref 65–99)
Potassium: 4.3 mmol/L (ref 3.5–5.1)
Sodium: 141 mmol/L (ref 135–145)

## 2017-07-12 MED ORDER — ENOXAPARIN SODIUM 40 MG/0.4ML ~~LOC~~ SOLN
40.0000 mg | SUBCUTANEOUS | Status: DC
  Administered 2017-07-12: 40 mg via SUBCUTANEOUS
  Filled 2017-07-12: qty 0.4

## 2017-07-12 NOTE — Progress Notes (Signed)
Hgb stable overnight, BUN now normal.  Gastric bx's negative for H. Pylori--pt and family members at bedside informed.  OK for dischg from GI standpoint.  No GI f/u needed.  Please see my note from yesterday for detailed recommendations, and call me if any questions.  I will sign off.  Cleotis Nipper, M.D. Pager [phone] If no answer or after 5 PM call [phone]

## 2017-07-12 NOTE — NC FL2 (Signed)
[place] MEDICAID FL2 LEVEL OF CARE SCREENING TOOL     IDENTIFICATION  Patient Name: Tiffany Maxwell Birthdate: 17-Jan-1934 Sex: female Admission Date (Current Location): 07/10/2017  [facility] and [place] Number:  Herbalist and Address:  [facility] [place], [place] [address], [place], [place] [address]      Provider Number: [id number]  Attending Physician Name and Address:  Velvet Bathe, MD  Relative Name and Phone Number:  Grant Henkes [phone]    Current Level of Care: Hospital Recommended Level of Care: [place] Prior Approval Number:    Date Approved/Denied:   PASRR Number: [id number]  Discharge Plan: SNF    Current Diagnoses: Patient Active Problem List   Diagnosis Date Noted  . Acute GI bleeding 07/10/2017  . Acute blood loss anemia 07/10/2017  . S/P right hip fracture/ORIF 07/10/2017  . Hyperlipidemia 07/10/2017  . HTN (hypertension), benign 07/10/2017  . Chronic combined systolic (EF 30%) and diastolic CHF, NYHA class 1 ([place]) 07/10/2017  . COPD (chronic obstructive pulmonary disease) ([place]) 07/10/2017  . Hyponatremia 07/10/2017  . Closed comminuted intertrochanteric fracture of proximal end of right femur ([place]) 06/21/2017  . Protein-calorie malnutrition, severe 06/21/2017  . Hip fracture ([place]) 06/20/2017  . Chronic combined systolic and diastolic CHF (congestive heart failure) ([place]) 06/20/2017  . GAD (generalized anxiety disorder) 11/06/2016  . Back pain 04/08/2014  . Multiple fractures of thoracic spine, closed ([place]) 01/30/2013  . Pulmonary emphysema ([place]) 01/30/2013  . History of tobacco use 01/30/2013  . Diastolic dysfunction 09/02/3006  . Hypokalemia 01/29/2013  . Osteoporosis 01/29/2013  . COPD with chronic bronchitis ([place]) 01/28/2013  . Compression fracture 01/28/2013  . HTN (hypertension) 01/28/2013    Orientation RESPIRATION BLADDER Height & Weight     Self  O2 (2L) Incontinent Weight: 111 lb  8.8 oz (50.6 kg) Height:  5\' 4"  (162.6 cm)  BEHAVIORAL SYMPTOMS/MOOD NEUROLOGICAL BOWEL NUTRITION STATUS      Continent Diet (heart room)  AMBULATORY STATUS COMMUNICATION OF NEEDS Skin   Limited Assist Verbally Normal                       Personal Care Assistance Level of Assistance  Bathing, Feeding, Dressing Bathing Assistance: Limited assistance Feeding assistance: Independent Dressing Assistance: Limited assistance     Functional Limitations Info  Sight, Hearing, Speech Sight Info: Adequate Hearing Info: Impaired Speech Info: Adequate    SPECIAL CARE FACTORS FREQUENCY  PT (By licensed PT), OT (By licensed OT)     PT Frequency: 5x wk OT Frequency: 5x wk            Contractures Contractures Info: Not present    Additional Factors Info    Code Status Info: DNR Allergies Info: CODEINE, BENICAR HCT OLMESARTAN MEDOXOMIL-HCTZ, HCTZ HYDROCHLOROTHIAZIDE, NAPROXEN           Current Medications (07/12/2017):  This is the current hospital active medication list Current Facility-Administered Medications  Medication Dose Route Frequency Provider Last Rate Last Dose  . acetaminophen (TYLENOL) tablet 650 mg  650 mg Oral Q6H PRN Samella Parr, NP       Or  . acetaminophen (TYLENOL) suppository 650 mg  650 mg Rectal Q6H PRN Samella Parr, NP      . enoxaparin (LOVENOX) injection 40 mg  40 mg Subcutaneous Q24H Velvet Bathe, MD   40 mg at 07/12/17 1010  . ipratropium-albuterol (DUONEB) 0.5-2.5 (3) MG/3ML nebulizer solution 3 mL  3 mL Nebulization  Once Recardo Evangelist, PA-C      . ipratropium-albuterol (DUONEB) 0.5-2.5 (3) MG/3ML nebulizer solution 3 mL  3 mL Nebulization Q6H PRN Waldemar Dickens, MD      . meperidine (DEMEROL) injection 6.25-12.5 mg  6.25-12.5 mg Intravenous Q5 min PRN Annye Asa, MD      . midazolam (VERSED) injection 0.5-2 mg  0.5-2 mg Intravenous Once PRN Annye Asa, MD      . morphine 2 MG/ML injection 1-4 mg  1-4 mg Intravenous  Q2H PRN Samella Parr, NP      . pantoprazole (PROTONIX) EC tablet 40 mg  40 mg Oral BID AC Ronald Lobo, MD   40 mg at 07/12/17 0636  . promethazine (PHENERGAN) injection 6.25-12.5 mg  6.25-12.5 mg Intravenous Q15 min PRN Annye Asa, MD      . sodium chloride flush (NS) 0.9 % injection 3 mL  3 mL Intravenous Q12H Samella Parr, NP   3 mL at 07/12/17 1017  . sucralfate (CARAFATE) 1 GM/10ML suspension 1 g  1 g Oral TID WC & HS Buccini, Robert, MD   1 g at 07/12/17 0900     Discharge Medications: Please see discharge summary for a list of discharge medications.  Relevant Imaging Results:  Relevant Lab Results:   Additional Information SS# [id number]  Wende Neighbors, LCSW

## 2017-07-12 NOTE — Progress Notes (Signed)
PROGRESS NOTE    Tiffany Maxwell  SJG:[id number] DOB: Nov 04, 1934 DOA: 07/10/2017 PCP: [name], FNP    Brief Narrative:   83 y.o. female who presents with symptomatic anemia likely secondary to combination of acute blood loss from recent surgery and gastrointestinal bleed   Assessment & Plan:   Principal Problem:   Acute GI bleeding - Patient had workup by gastroenterologist and endoscopy showed duodenal ulcer. Patient is low risk of rebleeding given clean-based ulcer per my discussion with GI specialist. However aspirin should be held for 2-4 weeks. And consideration should be given for lifelong PPI. - Discussed with orthopedic surgical team. Patient is supposed to be on 30 days of Lovenox. I will monitor in house and restart Lovenox. Discharge next a.m. with stable hemoglobin levels. Patient had ORIF on 06/20/2017   Active Problems:   S/P right hip fracture/ORIF 06/20/2017 - We'll plan on continuing Lovenox next a.m.    Hyperlipidemia   HTN (hypertension), benign - Stable    Chronic combined systolic (EF 65%) and diastolic CHF, NYHA class 1 (HCC)   COPD (chronic obstructive pulmonary disease) (HCC) - Stable    Hyponatremia - resolved - Suspect secondary to poor oral solute intake  DVT prophylaxis: SCD's Code Status: DNR Family Communication: no family at bedside Disposition Plan: DC next a.m. with stable hemoglobin levels after starting Lovenox   Consultants:   GI   Procedures: Endoscopy: EGD   Antimicrobials: None   Subjective: No new complaints reported.  Objective: Vitals:   07/12/17 1000 07/12/17 1018 07/12/17 1109 07/12/17 1536  BP:  (!) 143/53 125/72 128/73  Pulse:  70 69 69  Resp:   (!) 25 16  Temp: 97.7 F (36.5 C) 97.7 F (36.5 C) 98.6 F (37 C) 98.7 F (37.1 C)  TempSrc:  Oral Oral Oral  SpO2:  99% 96% 98%  Weight:      Height:        Intake/Output Summary (Last 24 hours) at 07/12/17 1721 Last data filed at 07/12/17  1300  Gross per 24 hour  Intake              360 ml  Output              200 ml  Net              160 ml   Filed Weights   07/11/17 0558 07/11/17 1222 07/12/17 0651  Weight: 50.3 kg (110 lb 14.3 oz) 49.9 kg (110 lb) 50.6 kg (111 lb 8.8 oz)    Examination:Exam unchanged when compared to 07/11/2017  General exam: Appears calm and comfortable, in nad. Respiratory system: Clear to auscultation. Respiratory effort normal. Cardiovascular system: S1 & S2 heard, RRR. No JVD, murmurs, rubs, gallops  Gastrointestinal system: Abdomen is nondistended, soft and nontender.  Central nervous system: Alert and oriented. No focal neurological deficits. Extremities: Symmetric 5 x 5 power. Skin: No rashes, lesions or ulcers, on limited exam. Psychiatry:  Mood & affect appropriate.     Data Reviewed: I have personally reviewed following labs and imaging studies  CBC:  Recent Labs Lab 07/10/17 1300 07/11/17 0501 07/12/17 0238  WBC 7.4 7.2 6.8  NEUTROABS 5.8  --   --   HGB 5.0* 7.7* 7.6*  HCT 16.3* 24.1* 24.3*  MCV 95.3 88.9 91.4  PLT 223 205 465   Basic Metabolic Panel:  Recent Labs Lab 07/10/17 1300 07/10/17 2219 07/11/17 0501 07/12/17 0238  NA 130*  --  137 141  K 4.2  --  4.3 4.3  CL 93*  --  101 104  CO2 32  --  33* 33*  GLUCOSE 112*  --  103* 88  BUN 53*  --  33* 20  CREATININE 0.55  --  0.33* 0.31*  CALCIUM 8.8*  --  8.6* 8.9  MG  --  2.0  --   --   PHOS  --  2.9  --   --    GFR: Estimated Creatinine Clearance: 44.1 mL/min (A) (by C-G formula based on SCr of 0.31 mg/dL (L)). Liver Function Tests:  Recent Labs Lab 07/11/17 0501  AST 19  ALT 9*  ALKPHOS 57  BILITOT 0.9  PROT 4.2*  ALBUMIN 2.4*   No results for input(s): LIPASE, AMYLASE in the last 168 hours. No results for input(s): AMMONIA in the last 168 hours. Coagulation Profile: No results for input(s): INR, PROTIME in the last 168 hours. Cardiac Enzymes: No results for input(s): CKTOTAL, CKMB,  CKMBINDEX, TROPONINI in the last 168 hours. BNP (last 3 results) No results for input(s): PROBNP in the last 8760 hours. HbA1C: No results for input(s): HGBA1C in the last 72 hours. CBG: No results for input(s): GLUCAP in the last 168 hours. Lipid Profile: No results for input(s): CHOL, HDL, LDLCALC, TRIG, CHOLHDL, LDLDIRECT in the last 72 hours. Thyroid Function Tests:  Recent Labs  07/11/17 0501  TSH 0.652   Anemia Panel:  Recent Labs  07/11/17 0501  VITAMINB12 238  FOLATE 15.6  FERRITIN 110  TIBC 311  IRON 148  RETICCTPCT 6.1*   Sepsis Labs: No results for input(s): PROCALCITON, LATICACIDVEN in the last 168 hours.  No results found for this or any previous visit (from the past 240 hour(s)).    Radiology Studies: No results found.   Scheduled Meds: . enoxaparin  40 mg Subcutaneous Q24H  . ipratropium-albuterol  3 mL Nebulization Once  . pantoprazole  40 mg Oral BID AC  . sodium chloride flush  3 mL Intravenous Q12H  . sucralfate  1 g Oral TID WC & HS   Continuous Infusions:   LOS: 2 days    Time spent: > 35 minutes  Velvet Bathe, MD [facility] Pager [phone]  If 7PM-7AM, please contact night-coverage [email or web address] Password [email or web address] 07/12/2017, 5:21 PM

## 2017-07-12 NOTE — Clinical Social Work Note (Signed)
Clinical Social Work Assessment  Patient Details  Name: Tiffany Maxwell MRN: [id number] Date of Birth: Jan 21, 1934  Date of referral:  07/12/17               Reason for consult:  Facility Placement, Discharge Planning                Permission sought to share information with:  Family Supports Permission granted to share information::  Yes, Verbal Permission Granted  Name::     Carlene Bickley  Agency::  SNF  Relationship::  son  Contact Information:  [phone]  Housing/Transportation Living arrangements for the past 2 months:  [place] of Information:  Patient, Adult Children Patient Interpreter Needed:  None Criminal Activity/Legal Involvement Pertinent to Current Situation/Hospitalization:  No - Comment as needed Significant Relationships:  Adult Children Lives with:  Self Do you feel safe going back to the place where you live?  Yes Need for family participation in patient care:  Yes (Comment)  Care giving concerns: Patient's son Louie Casa) at bedside. Son is very supportive    Facilities manager / plan:  Holiday representative met patient and son at bedside to offer support and discuss discharge needs. Patient stated she came from [facility] and would like to go back to facility to finish rehab. Patient son was in agreement and stated that the facility has been in contact with family. CSW spoke to admissions coordinator  from [facility] and she stated they are able to take patient back once medically cleared.   Employment status:  Retired Forensic scientist:  Information systems manager, Medicaid In [place] PT Recommendations:  [place] / Referral to community resources:  [facility]  Patient/Family's Response to care:  Patient and family were both in a good mood and was appreciative of CSW role in patients care.   Patient/Family's Understanding of and Emotional Response to Diagnosis, Current  Treatment, and Prognosis:  Patient and family agreeable to return back to facility to finish rehab. Patient has good understanding of recent hospitalization  Emotional Assessment Appearance:  Appears stated age Attitude/Demeanor/Rapport:  Unable to Assess (appropriate with attitude/demeanor and rapport) Affect (typically observed):  Appropriate, Accepting, Pleasant Orientation:  Oriented to Self Alcohol / Substance use:  Not Applicable Psych involvement (Current and /or in the community):  No (Comment)  Discharge Needs  Concerns to be addressed:  Care Coordination Readmission within the last 30 days:  Yes Current discharge risk:  None Barriers to Discharge:  Continued Medical Work up   [employer], LCSW 07/12/2017, 11:57 AM

## 2017-07-12 NOTE — Progress Notes (Signed)
OT Cancellation Note  Patient Details Name: CHELBY SALATA MRN: [id number] DOB: 1934/08/29   Cancelled Treatment:    Reason Eval/Treat Not Completed: OT screened, no needs identified, will sign off.  Pt from SNF for rehab with plan of returning to SNF at discharge (likely soon).  Will defer OT needs back to SNF.  Acute OT will sign off.   [place], OTR/L [phone]   Lucille Passy M 07/12/2017, 2:56 PM

## 2017-07-13 DIAGNOSIS — I10 Essential (primary) hypertension: Secondary | ICD-10-CM | POA: Diagnosis not present

## 2017-07-13 DIAGNOSIS — K5900 Constipation, unspecified: Secondary | ICD-10-CM | POA: Diagnosis not present

## 2017-07-13 DIAGNOSIS — R41841 Cognitive communication deficit: Secondary | ICD-10-CM | POA: Diagnosis not present

## 2017-07-13 DIAGNOSIS — K274 Chronic or unspecified peptic ulcer, site unspecified, with hemorrhage: Secondary | ICD-10-CM | POA: Diagnosis not present

## 2017-07-13 DIAGNOSIS — S72001D Fracture of unspecified part of neck of right femur, subsequent encounter for closed fracture with routine healing: Secondary | ICD-10-CM | POA: Diagnosis not present

## 2017-07-13 DIAGNOSIS — K922 Gastrointestinal hemorrhage, unspecified: Secondary | ICD-10-CM | POA: Diagnosis not present

## 2017-07-13 DIAGNOSIS — E785 Hyperlipidemia, unspecified: Secondary | ICD-10-CM | POA: Diagnosis not present

## 2017-07-13 DIAGNOSIS — S22000D Wedge compression fracture of unspecified thoracic vertebra, subsequent encounter for fracture with routine healing: Secondary | ICD-10-CM | POA: Diagnosis not present

## 2017-07-13 DIAGNOSIS — E43 Unspecified severe protein-calorie malnutrition: Secondary | ICD-10-CM | POA: Diagnosis not present

## 2017-07-13 DIAGNOSIS — D649 Anemia, unspecified: Secondary | ICD-10-CM | POA: Diagnosis not present

## 2017-07-13 DIAGNOSIS — F419 Anxiety disorder, unspecified: Secondary | ICD-10-CM | POA: Diagnosis not present

## 2017-07-13 DIAGNOSIS — F329 Major depressive disorder, single episode, unspecified: Secondary | ICD-10-CM | POA: Diagnosis not present

## 2017-07-13 DIAGNOSIS — R1311 Dysphagia, oral phase: Secondary | ICD-10-CM | POA: Diagnosis not present

## 2017-07-13 DIAGNOSIS — M810 Age-related osteoporosis without current pathological fracture: Secondary | ICD-10-CM | POA: Diagnosis not present

## 2017-07-13 DIAGNOSIS — I5042 Chronic combined systolic (congestive) and diastolic (congestive) heart failure: Secondary | ICD-10-CM | POA: Diagnosis not present

## 2017-07-13 DIAGNOSIS — R4182 Altered mental status, unspecified: Secondary | ICD-10-CM | POA: Diagnosis not present

## 2017-07-13 DIAGNOSIS — E871 Hypo-osmolality and hyponatremia: Secondary | ICD-10-CM | POA: Diagnosis not present

## 2017-07-13 DIAGNOSIS — K921 Melena: Secondary | ICD-10-CM | POA: Diagnosis not present

## 2017-07-13 DIAGNOSIS — M6281 Muscle weakness (generalized): Secondary | ICD-10-CM | POA: Diagnosis not present

## 2017-07-13 DIAGNOSIS — R197 Diarrhea, unspecified: Secondary | ICD-10-CM | POA: Diagnosis not present

## 2017-07-13 DIAGNOSIS — J449 Chronic obstructive pulmonary disease, unspecified: Secondary | ICD-10-CM | POA: Diagnosis not present

## 2017-07-13 DIAGNOSIS — S72141D Displaced intertrochanteric fracture of right femur, subsequent encounter for closed fracture with routine healing: Secondary | ICD-10-CM | POA: Diagnosis not present

## 2017-07-13 DIAGNOSIS — D62 Acute posthemorrhagic anemia: Secondary | ICD-10-CM | POA: Diagnosis not present

## 2017-07-13 DIAGNOSIS — Z79899 Other long term (current) drug therapy: Secondary | ICD-10-CM | POA: Diagnosis not present

## 2017-07-13 LAB — BASIC METABOLIC PANEL
Anion gap: 6 (ref 5–15)
BUN: 9 mg/dL (ref 6–20)
CHLORIDE: 97 mmol/L — AB (ref 101–111)
CO2: 35 mmol/L — ABNORMAL HIGH (ref 22–32)
Calcium: 8.7 mg/dL — ABNORMAL LOW (ref 8.9–10.3)
Creatinine, Ser: <0.3 mg/dL — ABNORMAL LOW (ref 0.44–1.00)
Glucose, Bld: 94 mg/dL (ref 65–99)
POTASSIUM: 3.6 mmol/L (ref 3.5–5.1)
SODIUM: 138 mmol/L (ref 135–145)

## 2017-07-13 LAB — CBC
HCT: 26.3 % — ABNORMAL LOW (ref 36.0–46.0)
HEMOGLOBIN: 8 g/dL — AB (ref 12.0–15.0)
MCH: 28.3 pg (ref 26.0–34.0)
MCHC: 30.4 g/dL (ref 30.0–36.0)
MCV: 92.9 fL (ref 78.0–100.0)
Platelets: 208 10*3/uL (ref 150–400)
RBC: 2.83 MIL/uL — AB (ref 3.87–5.11)
RDW: 20.1 % — ABNORMAL HIGH (ref 11.5–15.5)
WBC: 6 10*3/uL (ref 4.0–10.5)

## 2017-07-13 MED ORDER — PANTOPRAZOLE SODIUM 40 MG PO TBEC: 40.0000 mg | DELAYED_RELEASE_TABLET | Freq: Every day | ORAL | 0 refills | Status: DC

## 2017-07-13 MED ORDER — ENSURE ENLIVE PO LIQD
237.0000 mL | Freq: Two times a day (BID) | ORAL | Status: DC
  Administered 2017-07-13: 237 mL via ORAL

## 2017-07-13 NOTE — Care Management Note (Signed)
Case Management Note Marvetta Gibbons RN, BSN Unit 4E-Case Manager [phone]  Patient Details  Name: Tiffany Maxwell MRN: [id number] Date of Birth: Dec 10, 1934  Subjective/Objective:  Pt admitted with acute GIB                   Action/Plan: PTA pt was at [facility] for rehab ([facility]) from recent femur fx- CSW following for return to SNF when medically stable.   Expected Discharge Date:  07/13/17               Expected Discharge Plan:  [place]  In-House Referral:  Clinical Social Work  Discharge planning Services  CM Consult  Post Acute Care Choice:  NA Choice offered to:  NA  DME Arranged:  N/A DME Agency:  NA  HH Arranged:  NA HH Agency:  NA  Status of Service:  Completed, signed off  If discussed at [employer] of Stay Meetings, dates discussed:    Discharge Disposition: skilled facility   Additional Comments:  07/13/17- 12- Anchor Dwan RN, CM- pt stable for d/c today- plan to return to [facility] following for placement needs.   Dawayne Patricia, RN 07/13/2017, 10:59 AM

## 2017-07-13 NOTE — Care Management Important Message (Signed)
Important Message  Patient Details  Name: Tiffany Maxwell MRN: [id number] Date of Birth: Nov 11, 1934   Medicare Important Message Given:  Yes    Chuckie Mccathern Abena 07/13/2017, 9:53 AM

## 2017-07-13 NOTE — Progress Notes (Signed)
Clinical Social Worker facilitated patient discharge including contacting patient family and facility to confirm patient discharge plans.  Clinical information faxed to facility and family agreeable with plan.  CSW arranged ambulance transport via [facility] to [facility] .  RN to call [phone] and ask for 300 hall nurse (pt will be going in rm 304P) for report prior to discharge.  Clinical Social Worker will sign off for now as social work intervention is no longer needed. Please consult [place] again if new need arises.  Rhea Pink, MSW, [place]

## 2017-07-13 NOTE — Progress Notes (Signed)
Report called to [facility]

## 2017-07-13 NOTE — Progress Notes (Signed)
Physical Therapy Treatment Patient Details Name: Tiffany Maxwell MRN: [id number] DOB: 1934/02/02 Today's Date: 07/13/2017    History of Present Illness 83 y.o. female admitted on 06/19/17 due to fall with resultant R  intertrochanteric femur fx s/p IM Nail with post op TDWB 30%.  Pt with significant PMH of emphysema, PAC, thoracic spine fxs, MI, HTN, COPD, compression fx of lumbar spine, CHF, L compression hip screw.      PT Comments    Patient continues to require assistance +2 for OOB mobility to maintain PWB status. Continue to progress as tolerated with anticipated d/c to SNF for further skilled PT services.     Follow Up Recommendations  SNF     Equipment Recommendations  None recommended by PT    Recommendations for Other Services       Precautions / Restrictions Precautions Precautions: Fall Restrictions Weight Bearing Restrictions: Yes RLE Weight Bearing: Touchdown weight bearing RLE Partial Weight Bearing Percentage or Pounds: 30    Mobility  Bed Mobility Overal bed mobility: Needs Assistance Bed Mobility: Supine to Sit     Supine to sit: Mod assist;HOB elevated     General bed mobility comments: assist to bring bilat LE to EOB and to elevate trunk; cues for sequencing and use of rail  Transfers Overall transfer level: Needs assistance Equipment used: Rolling walker (2 wheeled) Transfers: Sit to/from [facility] Sit to Stand: +2 safety/equipment;Min assist Stand pivot transfers: Mod assist;+2 safety/equipment       General transfer comment: assist to power up into standing, for balance and weight shifting in standing, and to manage RW with pivot to chair; cues for hand placement, sequencing, and maintaining TDWB; therapist assisted to maintain wieghtbearing status   Ambulation/Gait             General Gait Details: Pt unable to maintain weigthbearing status well enough for ambulation   Stairs            Wheelchair Mobility    Modified Rankin (Stroke Patients Only)       Balance Overall balance assessment: Needs assistance Sitting-balance support: Feet supported;Bilateral upper extremity supported Sitting balance-Leahy Scale: Poor     Standing balance support: Bilateral upper extremity supported Standing balance-Leahy Scale: Poor                              Cognition Arousal/Alertness: Awake/alert Behavior During Therapy: WFL for tasks assessed/performed Overall Cognitive Status: Impaired/Different from baseline Area of Impairment: Memory;Problem solving;Safety/judgement                     Memory: Decreased short-term memory   Safety/Judgement: Decreased awareness of deficits   Problem Solving: Requires verbal cues;Difficulty sequencing        Exercises      General Comments General comments (skin integrity, edema, etc.): pt incontinent of urine and stool upon arrival      Pertinent Vitals/Pain Pain Assessment: Faces Faces Pain Scale: Hurts a little bit Pain Location: R hip Pain Descriptors / Indicators: Grimacing;Guarding Pain Intervention(s): Monitored during session;Limited activity within patient's tolerance;Repositioned    Home Living                      Prior Function            PT Goals (current goals can now be found in the care plan section) Progress towards PT goals: Progressing toward goals  Frequency    Min 3X/week      PT Plan Current plan remains appropriate    Co-evaluation              AM-PAC PT "6 Clicks" Daily Activity  Outcome Measure  Difficulty turning over in bed (including adjusting bedclothes, sheets and blankets)?: Total Difficulty moving from lying on back to sitting on the side of the bed? : Total Difficulty sitting down on and standing up from a chair with arms (e.g., wheelchair, bedside commode, etc,.)?: Total Help needed moving to and from a bed to chair (including a wheelchair)?: A Lot Help  needed walking in hospital room?: A Lot Help needed climbing 3-5 steps with a railing? : A Lot 6 Click Score: 9    End of Session Equipment Utilized During Treatment: Gait belt;Oxygen Activity Tolerance: Patient tolerated treatment well Patient left: in chair;with chair alarm set;with call bell/phone within reach Nurse Communication: Mobility status PT Visit Diagnosis: History of falling (Z91.81);Muscle weakness (generalized) (M62.81);Difficulty in walking, not elsewhere classified (R26.2);Pain Pain - Right/Left: Right Pain - part of body: Leg     Time: 8483-5075 PT Time Calculation (min) (ACUTE ONLY): 35 min  Charges:  $Therapeutic Activity: 23-37 mins                    G Codes:       Earney Navy, PTA Pager: [phone]     Darliss Cheney 07/13/2017, 4:22 PM

## 2017-07-13 NOTE — Discharge Summary (Signed)
Physician Discharge Summary  Tiffany Maxwell BOF:[id number] DOB: 1934-05-23 DOA: 07/10/2017  PCP: [name], FNP  Admit date: 07/10/2017 Discharge date: 07/13/2017  Time spent: > 35 minutes  Recommendations for Outpatient Follow-up:  1. Monitor hemoglobin levels 2. Ensure follow-up with gastroenterologist 3. Ensure follow-up with orthopedic specialist   Discharge Diagnoses:  Principal Problem:   Acute GI bleeding Active Problems:   Acute blood loss anemia   S/P right hip fracture/ORIF   Hyperlipidemia   HTN (hypertension), benign   Chronic combined systolic (EF 77%) and diastolic CHF, NYHA class 1 (HCC)   COPD (chronic obstructive pulmonary disease) (HCC)   Hyponatremia   Discharge Condition: stable  Diet recommendation: Heart healthy  Filed Weights   07/11/17 1222 07/12/17 0651 07/13/17 0300  Weight: 49.9 kg (110 lb) 50.6 kg (111 lb 8.8 oz) 49.8 kg (109 lb 12.6 oz)    History of present illness:  83 year old who had hip fracture and ORIF on 06/20/2017. Was discharged to rehabilitation center on aspirin and Lovenox and subsequently presented to the hospital with anemia and melena.  Hospital Course:  Principal Problem:   Acute GI bleeding - Patient had workup by gastroenterologist and endoscopy showed duodenal ulcer. Patient is low risk of rebleeding given clean-based ulcer per my discussion with GI specialist. However aspirin should be held for 2-4 weeks. And consideration should be given for lifelong PPI. - Discussed with orthopedic surgical team. Patient is supposed to be on 30 days of Lovenox. I will monitor in house and restart Lovenox. Discharge next a.m. with stable hemoglobin levels. Patient had ORIF on 06/20/2017   Active Problems:   S/P right hip fracture/ORIF 06/20/2017 - We'll plan on continuing Lovenox on discharge. Can be discontinued after 30 days of use. Please see discontinuation date on prescription.    Hyperlipidemia   HTN  (hypertension), benign - Stable    Chronic combined systolic (EF 82%) and diastolic CHF, NYHA class 1 ([place]) - Appears compensated currently.    COPD (chronic obstructive pulmonary disease) (HCC) - Stable continue home medication regimen    Hyponatremia - resolved - Suspect secondary to poor oral solute intake  Procedures:  EGD showing duodenal ulcer  Consultations:  Gastroenterologist Dr. Cristina Gong  Discharge Exam: Vitals:   07/13/17 0440 07/13/17 0800  BP: (!) 147/64 137/68  Pulse: 69 65  Resp: 18 18  Temp: 97.6 F (36.4 C)     General: Pt in nad, alert and awake Cardiovascular: rrr, no rubs Respiratory: no increased wob, no wheezes  Discharge Instructions   Discharge Instructions    Call MD for:  severe uncontrolled pain    Complete by:  As directed    Call MD for:  temperature >100.4    Complete by:  As directed    Diet - low sodium heart healthy    Complete by:  As directed    Discharge instructions    Complete by:  As directed    Please ensure patient follows up with orthopedic surgeon. We'll also need to follow-up with gastroenterologist for biopsy results.   Increase activity slowly    Complete by:  As directed      Current Discharge Medication List    START taking these medications   Details  pantoprazole (PROTONIX) 40 MG tablet Take 1 tablet (40 mg total) by mouth daily. Qty: 30 tablet, Refills: 0      CONTINUE these medications which have NOT CHANGED   Details  benzonatate (TESSALON) 100 MG capsule TAKE 1 CAPSULE TWICE  DAILY AS NEEDED FOR COUGH Qty: 20 capsule, Refills: 0    Calcium Carbonate-Vitamin D (CALCIUM 600+D3) 600-400 MG-UNIT per tablet Take 1 tablet by mouth 2 (two) times daily.    citalopram (CELEXA) 20 MG tablet Take 1 tablet (20 mg total) by mouth daily.    enoxaparin (LOVENOX) 40 MG/0.4ML injection Inject 0.4 mLs (40 mg total) into the skin daily. Qty: 30 Syringe, Refills: 0    feeding supplement, ENSURE ENLIVE, (ENSURE  ENLIVE) LIQD Take 237 mLs by mouth 2 (two) times daily between meals.    ferrous sulfate 325 (65 FE) MG tablet Take 1 tablet (325 mg total) by mouth 2 (two) times daily with a meal.    HYDROcodone-acetaminophen (NORCO/VICODIN) 5-325 MG tablet Take 1-2 tablets by mouth every 6 (six) hours as needed for moderate pain. Qty: 30 tablet, Refills: 0    Ipratropium-Albuterol (COMBIVENT RESPIMAT) 20-100 MCG/ACT AERS respimat Inhale 1 puff into the lungs every 6 (six) hours as needed for wheezing. Qty: 8 g, Refills: 5   Associated Diagnoses: COPD with chronic bronchitis ([place])    OXYGEN Inhale 4 L into the lungs See admin instructions. TO MAINTAIN SATS OF 93% OR GREATER    polyethylene glycol powder (GLYCOLAX/MIRALAX) powder 17 GRAMS (1 CAPFUL) ONCE A DAY AS DIRECTED Qty: 527 g, Refills: 2    VENTOLIN HFA 108 (90 Base) MCG/ACT inhaler 2 PUFFS EVERY 6 HOURS AS NEEDED FOR WHEEZING OR SHORTNESS OF BREATH Qty: 18 g, Refills: 3    vitamin C (ASCORBIC ACID) 500 MG tablet Take 500 mg by mouth 2 (two) times daily.      STOP taking these medications     aspirin EC 81 MG tablet      lisinopril (PRINIVIL,ZESTRIL) 40 MG tablet        Allergies  Allergen Reactions  . Codeine Other (See Comments)    Headache   . Benicar Hct [Olmesartan Medoxomil-Hctz] Swelling and Rash  . Hctz [Hydrochlorothiazide] Swelling and Rash  . Naproxen Itching, Rash and Other (See Comments)    Redness/flushing of skin      The results of significant diagnostics from this hospitalization (including imaging, microbiology, ancillary and laboratory) are listed below for reference.    Significant Diagnostic Studies: Pelvis Portable  Result Date: 06/20/2017 CLINICAL DATA:  83 y/o  F; postop right hip nail placement. EXAM: PORTABLE PELVIS 1-2 VIEWS COMPARISON:  06/20/2017 right hip radiographs. FINDINGS: Right proximal femur intramedullary nail fixing an intertrochanteric fracture with proximal screw traversing femoral neck  and distal interlocking threaded screw. Air and edema in the soft tissues are compatible postsurgical changes. Lesser trochanter fracture with increased medial avulsion in comparison with prior radiographs. No new acute fracture identified. Left proximal femur hardware without apparent hardware related complication. IMPRESSION: 1. Right femur intertrochanteric fracture post intramedullary nail fixation with expected postsurgical changes. 2. Right lesser trochanter fracture with interval increase medial displacement. Electronically Signed   By: Kristine Garbe M.D.   On: 06/20/2017 22:57   Dg Chest Portable 1 View  Result Date: 06/20/2017 CLINICAL DATA:  Initial evaluation for hip fracture, history of COPD. EXAM: PORTABLE CHEST 1 VIEW COMPARISON:  Prior radiograph from 06/18/2016. FINDINGS: Moderate cardiomegaly, stable. Mediastinal silhouette within normal limits. Aortic atherosclerosis. Lungs normally inflated. Underlying COPD. Diffuse pulmonary vascular congestion without frank pulmonary edema. No focal infiltrates. No pleural effusion. No pneumothorax. No acute osseus abnormality.  Osteopenia. IMPRESSION: 1. Cardiomegaly with diffuse pulmonary vascular congestion without frank pulmonary edema. 2. Underlying COPD. 3. Aortic atherosclerosis. Electronically Signed  By: Jeannine Boga M.D.   On: 06/20/2017 00:13   Dg C-arm 1-60 Min  Result Date: 06/20/2017 CLINICAL DATA:  Elective surgery. EXAM: RIGHT FEMUR 2 VIEWS; DG C-ARM 61-120 MIN COMPARISON:  Preoperative radiographs earlier this day. FINDINGS: Three fluoroscopic spot images in the operating room demonstrate intramedullary nail with trans trochanteric lag screw fixating intertrochanteric right femur fracture. Fluoroscopy time 1 minutes 6 seconds. IMPRESSION: Procedural fluoroscopy post intramedullary nail and lag screw fixating intertrochanteric femur fracture. Electronically Signed   By: Jeb Levering M.D.   On: 06/20/2017 22:46    Dg Hip Unilat  With Pelvis 2-3 Views Right  Result Date: 06/19/2017 CLINICAL DATA:  Fall with right hip pain EXAM: DG HIP (WITH OR WITHOUT PELVIS) 2-3V RIGHT COMPARISON:  05/30/2013 FINDINGS: Status post surgical rodding of the left femur with intact hardware. Old fracture deformity of the left femoral trochanter. Pubic symphysis is intact. The rami also appear intact. Mild feces impaction in the rectum. SI joints are symmetric. Comminuted and slightly impacted intertrochanteric fracture of the right proximal femur. No dislocation. Mild to moderate arthritis of the right hip IMPRESSION: 1. Acute slightly impacted intertrochanteric fracture of the proximal right femur. No dislocation 2. Status post intramedullary rodding of left femur across old fracture deformity Electronically Signed   By: Donavan Foil M.D.   On: 06/19/2017 23:03   Dg Femur, Min 2 Views Right  Result Date: 06/20/2017 CLINICAL DATA:  Elective surgery. EXAM: RIGHT FEMUR 2 VIEWS; DG C-ARM 61-120 MIN COMPARISON:  Preoperative radiographs earlier this day. FINDINGS: Three fluoroscopic spot images in the operating room demonstrate intramedullary nail with trans trochanteric lag screw fixating intertrochanteric right femur fracture. Fluoroscopy time 1 minutes 6 seconds. IMPRESSION: Procedural fluoroscopy post intramedullary nail and lag screw fixating intertrochanteric femur fracture. Electronically Signed   By: Jeb Levering M.D.   On: 06/20/2017 22:46   Dg Femur Port, Min 2 Views Right  Result Date: 06/20/2017 CLINICAL DATA:  Intertrochanteric fracture of proximal femur. EXAM: RIGHT FEMUR PORTABLE 2 VIEW COMPARISON:  None. FINDINGS: Mildly displaced and possibly comminuted fracture is seen involving the intertrochanteric region of proximal right femur. No fracture is seen involving the middle or distal aspects of the right femur. IMPRESSION: Displaced and possibly comminuted intertrochanteric fracture of proximal right femur. No other  abnormality seen in the right femur. Electronically Signed   By: Marijo Conception, M.D.   On: 06/20/2017 08:44    Microbiology: No results found for this or any previous visit (from the past 240 hour(s)).   Labs: Basic Metabolic Panel:  Recent Labs Lab 07/10/17 1300 07/10/17 2219 07/11/17 0501 07/12/17 0238 07/13/17 0337  NA 130*  --  137 141 138  K 4.2  --  4.3 4.3 3.6  CL 93*  --  101 104 97*  CO2 32  --  33* 33* 35*  GLUCOSE 112*  --  103* 88 94  BUN 53*  --  33* 20 9  CREATININE 0.55  --  0.33* 0.31* <0.30*  CALCIUM 8.8*  --  8.6* 8.9 8.7*  MG  --  2.0  --   --   --   PHOS  --  2.9  --   --   --    Liver Function Tests:  Recent Labs Lab 07/11/17 0501  AST 19  ALT 9*  ALKPHOS 57  BILITOT 0.9  PROT 4.2*  ALBUMIN 2.4*   No results for input(s): LIPASE, AMYLASE in the last 168 hours. No results  for input(s): AMMONIA in the last 168 hours. CBC:  Recent Labs Lab 07/10/17 1300 07/11/17 0501 07/12/17 0238 07/13/17 0337  WBC 7.4 7.2 6.8 6.0  NEUTROABS 5.8  --   --   --   HGB 5.0* 7.7* 7.6* 8.0*  HCT 16.3* 24.1* 24.3* 26.3*  MCV 95.3 88.9 91.4 92.9  PLT 223 205 203 208   Cardiac Enzymes: No results for input(s): CKTOTAL, CKMB, CKMBINDEX, TROPONINI in the last 168 hours. BNP: BNP (last 3 results) No results for input(s): BNP in the last 8760 hours.  ProBNP (last 3 results) No results for input(s): PROBNP in the last 8760 hours.  CBG: No results for input(s): GLUCAP in the last 168 hours.   Signed:  Velvet Bathe MD.  [facility] 07/13/2017, 10:54 AM

## 2017-07-14 ENCOUNTER — Encounter (HOSPITAL_COMMUNITY): Payer: Self-pay | Admitting: Gastroenterology

## 2017-07-20 DIAGNOSIS — S72001D Fracture of unspecified part of neck of right femur, subsequent encounter for closed fracture with routine healing: Secondary | ICD-10-CM | POA: Diagnosis not present

## 2017-07-20 DIAGNOSIS — M810 Age-related osteoporosis without current pathological fracture: Secondary | ICD-10-CM | POA: Diagnosis not present

## 2017-07-20 DIAGNOSIS — I10 Essential (primary) hypertension: Secondary | ICD-10-CM | POA: Diagnosis not present

## 2017-07-20 DIAGNOSIS — I5042 Chronic combined systolic (congestive) and diastolic (congestive) heart failure: Secondary | ICD-10-CM | POA: Diagnosis not present

## 2017-08-09 ENCOUNTER — Ambulatory Visit: Payer: Medicare Other | Admitting: Nurse Practitioner

## 2017-08-09 DIAGNOSIS — D62 Acute posthemorrhagic anemia: Secondary | ICD-10-CM | POA: Diagnosis not present

## 2017-08-09 DIAGNOSIS — E43 Unspecified severe protein-calorie malnutrition: Secondary | ICD-10-CM | POA: Diagnosis not present

## 2017-08-09 DIAGNOSIS — K274 Chronic or unspecified peptic ulcer, site unspecified, with hemorrhage: Secondary | ICD-10-CM | POA: Diagnosis not present

## 2017-08-09 DIAGNOSIS — I5042 Chronic combined systolic (congestive) and diastolic (congestive) heart failure: Secondary | ICD-10-CM | POA: Diagnosis not present

## 2017-08-31 DIAGNOSIS — S72001D Fracture of unspecified part of neck of right femur, subsequent encounter for closed fracture with routine healing: Secondary | ICD-10-CM | POA: Diagnosis not present

## 2017-08-31 DIAGNOSIS — M810 Age-related osteoporosis without current pathological fracture: Secondary | ICD-10-CM | POA: Diagnosis not present

## 2017-08-31 DIAGNOSIS — I10 Essential (primary) hypertension: Secondary | ICD-10-CM | POA: Diagnosis not present

## 2017-08-31 DIAGNOSIS — I5042 Chronic combined systolic (congestive) and diastolic (congestive) heart failure: Secondary | ICD-10-CM | POA: Diagnosis not present

## 2017-09-11 DIAGNOSIS — F419 Anxiety disorder, unspecified: Secondary | ICD-10-CM | POA: Diagnosis not present

## 2017-09-11 DIAGNOSIS — F329 Major depressive disorder, single episode, unspecified: Secondary | ICD-10-CM | POA: Diagnosis not present

## 2017-09-11 DIAGNOSIS — R41841 Cognitive communication deficit: Secondary | ICD-10-CM | POA: Diagnosis not present

## 2017-10-02 DIAGNOSIS — K274 Chronic or unspecified peptic ulcer, site unspecified, with hemorrhage: Secondary | ICD-10-CM | POA: Diagnosis not present

## 2017-10-02 DIAGNOSIS — D62 Acute posthemorrhagic anemia: Secondary | ICD-10-CM | POA: Diagnosis not present

## 2017-10-02 DIAGNOSIS — M6281 Muscle weakness (generalized): Secondary | ICD-10-CM | POA: Diagnosis not present

## 2017-10-02 DIAGNOSIS — S72001D Fracture of unspecified part of neck of right femur, subsequent encounter for closed fracture with routine healing: Secondary | ICD-10-CM | POA: Diagnosis not present

## 2017-10-08 DIAGNOSIS — F419 Anxiety disorder, unspecified: Secondary | ICD-10-CM | POA: Diagnosis not present

## 2017-10-08 DIAGNOSIS — F329 Major depressive disorder, single episode, unspecified: Secondary | ICD-10-CM | POA: Diagnosis not present

## 2017-10-08 DIAGNOSIS — R41841 Cognitive communication deficit: Secondary | ICD-10-CM | POA: Diagnosis not present

## 2017-10-09 DIAGNOSIS — S72141D Displaced intertrochanteric fracture of right femur, subsequent encounter for closed fracture with routine healing: Secondary | ICD-10-CM | POA: Diagnosis not present

## 2017-10-09 DIAGNOSIS — H548 Legal blindness, as defined in USA: Secondary | ICD-10-CM | POA: Diagnosis not present

## 2017-10-09 DIAGNOSIS — Z9981 Dependence on supplemental oxygen: Secondary | ICD-10-CM | POA: Diagnosis not present

## 2017-10-09 DIAGNOSIS — I5042 Chronic combined systolic (congestive) and diastolic (congestive) heart failure: Secondary | ICD-10-CM | POA: Diagnosis not present

## 2017-10-09 DIAGNOSIS — F329 Major depressive disorder, single episode, unspecified: Secondary | ICD-10-CM | POA: Diagnosis not present

## 2017-10-09 DIAGNOSIS — I110 Hypertensive heart disease with heart failure: Secondary | ICD-10-CM | POA: Diagnosis not present

## 2017-10-09 DIAGNOSIS — Z9181 History of falling: Secondary | ICD-10-CM | POA: Diagnosis not present

## 2017-10-09 DIAGNOSIS — J439 Emphysema, unspecified: Secondary | ICD-10-CM | POA: Diagnosis not present

## 2017-10-09 DIAGNOSIS — Z7982 Long term (current) use of aspirin: Secondary | ICD-10-CM | POA: Diagnosis not present

## 2017-10-09 DIAGNOSIS — F411 Generalized anxiety disorder: Secondary | ICD-10-CM | POA: Diagnosis not present

## 2017-10-09 DIAGNOSIS — M810 Age-related osteoporosis without current pathological fracture: Secondary | ICD-10-CM | POA: Diagnosis not present

## 2017-10-09 DIAGNOSIS — Z87891 Personal history of nicotine dependence: Secondary | ICD-10-CM | POA: Diagnosis not present

## 2017-10-10 DIAGNOSIS — S72141D Displaced intertrochanteric fracture of right femur, subsequent encounter for closed fracture with routine healing: Secondary | ICD-10-CM | POA: Diagnosis not present

## 2017-10-10 DIAGNOSIS — I110 Hypertensive heart disease with heart failure: Secondary | ICD-10-CM | POA: Diagnosis not present

## 2017-10-10 DIAGNOSIS — M810 Age-related osteoporosis without current pathological fracture: Secondary | ICD-10-CM | POA: Diagnosis not present

## 2017-10-10 DIAGNOSIS — H548 Legal blindness, as defined in USA: Secondary | ICD-10-CM | POA: Diagnosis not present

## 2017-10-10 DIAGNOSIS — J439 Emphysema, unspecified: Secondary | ICD-10-CM | POA: Diagnosis not present

## 2017-10-10 DIAGNOSIS — I5042 Chronic combined systolic (congestive) and diastolic (congestive) heart failure: Secondary | ICD-10-CM | POA: Diagnosis not present

## 2017-10-11 DIAGNOSIS — J439 Emphysema, unspecified: Secondary | ICD-10-CM | POA: Diagnosis not present

## 2017-10-11 DIAGNOSIS — H548 Legal blindness, as defined in USA: Secondary | ICD-10-CM | POA: Diagnosis not present

## 2017-10-11 DIAGNOSIS — I5042 Chronic combined systolic (congestive) and diastolic (congestive) heart failure: Secondary | ICD-10-CM | POA: Diagnosis not present

## 2017-10-11 DIAGNOSIS — M810 Age-related osteoporosis without current pathological fracture: Secondary | ICD-10-CM | POA: Diagnosis not present

## 2017-10-11 DIAGNOSIS — S72141D Displaced intertrochanteric fracture of right femur, subsequent encounter for closed fracture with routine healing: Secondary | ICD-10-CM | POA: Diagnosis not present

## 2017-10-11 DIAGNOSIS — I110 Hypertensive heart disease with heart failure: Secondary | ICD-10-CM | POA: Diagnosis not present

## 2017-10-15 DIAGNOSIS — I110 Hypertensive heart disease with heart failure: Secondary | ICD-10-CM | POA: Diagnosis not present

## 2017-10-15 DIAGNOSIS — J439 Emphysema, unspecified: Secondary | ICD-10-CM | POA: Diagnosis not present

## 2017-10-15 DIAGNOSIS — I5042 Chronic combined systolic (congestive) and diastolic (congestive) heart failure: Secondary | ICD-10-CM | POA: Diagnosis not present

## 2017-10-15 DIAGNOSIS — M810 Age-related osteoporosis without current pathological fracture: Secondary | ICD-10-CM | POA: Diagnosis not present

## 2017-10-15 DIAGNOSIS — S72141D Displaced intertrochanteric fracture of right femur, subsequent encounter for closed fracture with routine healing: Secondary | ICD-10-CM | POA: Diagnosis not present

## 2017-10-15 DIAGNOSIS — H548 Legal blindness, as defined in USA: Secondary | ICD-10-CM | POA: Diagnosis not present

## 2017-10-16 DIAGNOSIS — M810 Age-related osteoporosis without current pathological fracture: Secondary | ICD-10-CM | POA: Diagnosis not present

## 2017-10-16 DIAGNOSIS — S72141D Displaced intertrochanteric fracture of right femur, subsequent encounter for closed fracture with routine healing: Secondary | ICD-10-CM | POA: Diagnosis not present

## 2017-10-16 DIAGNOSIS — I110 Hypertensive heart disease with heart failure: Secondary | ICD-10-CM | POA: Diagnosis not present

## 2017-10-16 DIAGNOSIS — I5042 Chronic combined systolic (congestive) and diastolic (congestive) heart failure: Secondary | ICD-10-CM | POA: Diagnosis not present

## 2017-10-16 DIAGNOSIS — J439 Emphysema, unspecified: Secondary | ICD-10-CM | POA: Diagnosis not present

## 2017-10-16 DIAGNOSIS — H548 Legal blindness, as defined in USA: Secondary | ICD-10-CM | POA: Diagnosis not present

## 2017-10-17 DIAGNOSIS — I110 Hypertensive heart disease with heart failure: Secondary | ICD-10-CM | POA: Diagnosis not present

## 2017-10-17 DIAGNOSIS — S72141D Displaced intertrochanteric fracture of right femur, subsequent encounter for closed fracture with routine healing: Secondary | ICD-10-CM | POA: Diagnosis not present

## 2017-10-17 DIAGNOSIS — J439 Emphysema, unspecified: Secondary | ICD-10-CM | POA: Diagnosis not present

## 2017-10-17 DIAGNOSIS — M810 Age-related osteoporosis without current pathological fracture: Secondary | ICD-10-CM | POA: Diagnosis not present

## 2017-10-17 DIAGNOSIS — H548 Legal blindness, as defined in USA: Secondary | ICD-10-CM | POA: Diagnosis not present

## 2017-10-17 DIAGNOSIS — I5042 Chronic combined systolic (congestive) and diastolic (congestive) heart failure: Secondary | ICD-10-CM | POA: Diagnosis not present

## 2017-10-18 ENCOUNTER — Other Ambulatory Visit: Payer: Self-pay

## 2017-10-18 ENCOUNTER — Ambulatory Visit (INDEPENDENT_AMBULATORY_CARE_PROVIDER_SITE_OTHER): Payer: Medicare Other | Admitting: Nurse Practitioner

## 2017-10-18 ENCOUNTER — Encounter: Payer: Self-pay | Admitting: Nurse Practitioner

## 2017-10-18 VITALS — BP 144/80 | HR 71 | Temp 97.9°F | Ht 64.0 in | Wt 108.0 lb

## 2017-10-18 DIAGNOSIS — M4854XD Collapsed vertebra, not elsewhere classified, thoracic region, subsequent encounter for fracture with routine healing: Secondary | ICD-10-CM

## 2017-10-18 DIAGNOSIS — J449 Chronic obstructive pulmonary disease, unspecified: Secondary | ICD-10-CM | POA: Diagnosis not present

## 2017-10-18 DIAGNOSIS — K219 Gastro-esophageal reflux disease without esophagitis: Secondary | ICD-10-CM

## 2017-10-18 DIAGNOSIS — S72141D Displaced intertrochanteric fracture of right femur, subsequent encounter for closed fracture with routine healing: Secondary | ICD-10-CM

## 2017-10-18 DIAGNOSIS — I5042 Chronic combined systolic (congestive) and diastolic (congestive) heart failure: Secondary | ICD-10-CM

## 2017-10-18 DIAGNOSIS — F411 Generalized anxiety disorder: Secondary | ICD-10-CM

## 2017-10-18 DIAGNOSIS — I10 Essential (primary) hypertension: Secondary | ICD-10-CM | POA: Diagnosis not present

## 2017-10-18 DIAGNOSIS — E785 Hyperlipidemia, unspecified: Secondary | ICD-10-CM | POA: Diagnosis not present

## 2017-10-18 DIAGNOSIS — M8000XS Age-related osteoporosis with current pathological fracture, unspecified site, sequela: Secondary | ICD-10-CM | POA: Diagnosis not present

## 2017-10-18 DIAGNOSIS — D500 Iron deficiency anemia secondary to blood loss (chronic): Secondary | ICD-10-CM

## 2017-10-18 MED ORDER — PANTOPRAZOLE SODIUM 40 MG PO TBEC: 40.0000 mg | DELAYED_RELEASE_TABLET | Freq: Every day | ORAL | 0 refills | Status: DC

## 2017-10-18 MED ORDER — FERROUS SULFATE 325 (65 FE) MG PO TABS: 325.0000 mg | ORAL_TABLET | Freq: Two times a day (BID) | ORAL | Status: DC

## 2017-10-18 MED ORDER — IPRATROPIUM-ALBUTEROL 20-100 MCG/ACT IN AERS: 1.0000 | INHALATION_SPRAY | Freq: Four times a day (QID) | RESPIRATORY_TRACT | 5 refills | Status: DC | PRN

## 2017-10-18 MED ORDER — MIRTAZAPINE 15 MG PO TABS: 15.0000 mg | ORAL_TABLET | Freq: Every day | ORAL | 5 refills | Status: DC

## 2017-10-18 MED ORDER — CITALOPRAM HYDROBROMIDE 20 MG PO TABS: 20.0000 mg | ORAL_TABLET | Freq: Every day | ORAL | Status: DC

## 2017-10-18 NOTE — Progress Notes (Signed)
Subjective:    Patient ID: Tiffany Maxwell, female    DOB: 24-May-1934, 83 y.o.   MRN: [id number]  HPI Tiffany Maxwell is here today for follow up of chronic medical problem.  Outpatient Encounter Medications as of 10/18/2017  Medication Sig  . Calcium Carbonate-Vitamin D (CALCIUM 600+D3) 600-400 MG-UNIT per tablet Take 1 tablet by mouth 2 (two) times daily.  . citalopram (CELEXA) 20 MG tablet Take 1 tablet (20 mg total) by mouth daily.  . feeding supplement, ENSURE ENLIVE, (ENSURE ENLIVE) LIQD Take 237 mLs by mouth 2 (two) times daily between meals.  . ferrous sulfate 325 (65 FE) MG tablet Take 1 tablet (325 mg total) by mouth 2 (two) times daily with a meal.  . Ipratropium-Albuterol (COMBIVENT RESPIMAT) 20-100 MCG/ACT AERS respimat Inhale 1 puff into the lungs every 6 (six) hours as needed for wheezing.  . mirtazapine (REMERON) 15 MG tablet   . OXYGEN Inhale 4 L into the lungs See admin instructions. TO MAINTAIN SATS OF 93% OR GREATER  . pantoprazole (PROTONIX) 40 MG tablet Take 1 tablet (40 mg total) by mouth daily.  . polyethylene glycol powder (GLYCOLAX/MIRALAX) powder 17 GRAMS (1 CAPFUL) ONCE A DAY AS DIRECTED (Patient taking differently: Mix and drink once a day: 17 grams of powder into 6-8 ounces of juice or water)  . VENTOLIN HFA 108 (90 Base) MCG/ACT inhaler 2 PUFFS EVERY 6 HOURS AS NEEDED FOR WHEEZING OR SHORTNESS OF BREATH (Patient taking differently: Inhale 2 puffs into the lungs every 6 hours as needed for wheezing or shortness of breath)  . vitamin C (ASCORBIC ACID) 500 MG tablet Take 500 mg by mouth 2 (two) times daily.  Marland Kitchen enoxaparin (LOVENOX) 40 MG/0.4ML injection Inject 0.4 mLs (40 mg total) into the skin daily. (Patient taking differently: Inject 40 mg into the skin daily. END DATE: 07/23/17)  . HYDROcodone-acetaminophen (NORCO/VICODIN) 5-325 MG tablet Take 1-2 tablets by mouth every 6 (six) hours as needed for moderate pain. (Patient taking differently: Take 1-2 tablets by  mouth every 6 (six) hours as needed for moderate pain or severe pain. )     1. Essential hypertension  No c/o chest pain, sob or headache. Does not check blood pressures at home. BP Readings from Last 3 Encounters:  10/18/17 (!) 171/83  07/13/17 137/68  06/23/17 (!) 143/73     2. Chronic combined systolic (EF 18%) and diastolic CHF, NYHA class 1 (HCC) Always has SOB from COPD but no chest pain. Has not seen cardiologist in over a year.   3. Chronic obstructive pulmonary disease, unspecified COPD type ([place])  She is still on oxygen at home 24/7.  4. Age-related osteoporosis with current pathological fracture, sequela  Has had fractures in past- has constant back pain  5. Non-traumatic compression fracture of T9 thoracic vertebra with routine healing, subsequent encounter  No recent fractures  6. Closed comminuted intertrochanteric fracture of right femur with routine healing, subsequent encounter  Broke help July 10 from fall at home- has been in nursing home for rehab and just got out October 25,2018. Is getting physical therapy at home. Living with her son in her own home. They are not letting her walk at all without her walker and will ot let her walk more then 150 feet.  7. Hyperlipidemia, unspecified hyperlipidemia type  Not watching diet  8. GAD (generalized anxiety disorder)  Stays stressed.    New complaints: None today  Social history: Does not get along with daughter in law  who lives with her.   Review of Systems  Constitutional: Negative for activity change and appetite change.  HENT: Negative.   Eyes: Negative for pain.  Respiratory: Negative for shortness of breath.   Cardiovascular: Negative for chest pain, palpitations and leg swelling.  Gastrointestinal: Negative for abdominal pain.  Endocrine: Negative for polydipsia.  Genitourinary: Negative.   Skin: Negative for rash.  Neurological: Negative for dizziness, weakness and headaches.  Hematological: Does not  bruise/bleed easily.  Psychiatric/Behavioral: Negative.   All other systems reviewed and are negative.      Objective:   Physical Exam  Constitutional: She is oriented to person, place, and time. She appears well-developed and well-nourished.  HENT:  Nose: Nose normal.  Mouth/Throat: Oropharynx is clear and moist.  Eyes: EOM are normal.  Neck: Trachea normal, normal range of motion and full passive range of motion without pain. Neck supple. No JVD present. Carotid bruit is not present. No thyromegaly present.  Cardiovascular: Normal rate, regular rhythm, normal heart sounds and intact distal pulses. Exam reveals no gallop and no friction rub.  No murmur heard. Pulmonary/Chest: Effort normal and breath sounds normal.  o2 via nasal cannulla  Abdominal: Soft. Bowel sounds are normal. She exhibits no distension and no mass. There is no tenderness.  Musculoskeletal: Normal range of motion.  Sitting with wheel chair  Lymphadenopathy:    She has no cervical adenopathy.  Neurological: She is alert and oriented to person, place, and time. She has normal reflexes.  Skin: Skin is warm and dry.  Psychiatric: She has a normal mood and affect. Her behavior is normal. Judgment and thought content normal.    BP (!) 144/80 (BP Location: Left Arm, Cuff Size: Small)   Pulse 71   Temp 97.9 F (36.6 C) (Oral)   Ht 5' 4"  (1.626 m)   Wt 108 lb (49 kg)   SpO2 93% Comment: On 1.5 Liter of O2  BMI 18.54 kg/m       Assessment & Plan:  1. Essential hypertension Low sodium diet - CMP14+EGFR  2. Chronic combined systolic (EF 02%) and diastolic CHF, NYHA class 1 ([place])  3. Chronic obstructive pulmonary disease, unspecified COPD type ([place])  4. Age-related osteoporosis with current pathological fracture, sequela Fall precautrions  5. Non-traumatic compression fracture of T9 thoracic vertebra with routine healing, subsequent encounter  6. Closed comminuted intertrochanteric fracture of right femur  with routine healing, subsequent encounter  7. Hyperlipidemia, unspecified hyperlipidemia type Low fat diet - Lipid panel  8. GAD (generalized anxiety disorder) Stress management - citalopram (CELEXA) 20 MG tablet; Take 1 tablet (20 mg total) daily by mouth.  9. COPD with chronic bronchitis (HCC) Continue oxygen  - Ipratropium-Albuterol (COMBIVENT RESPIMAT) 20-100 MCG/ACT AERS respimat; Inhale 1 puff every 6 (six) hours as needed into the lungs for wheezing.  Dispense: 8 g; Refill: 5  10. Iron deficiency anemia due to chronic blood loss Labs pending - ferrous sulfate 325 (65 FE) MG tablet; Take 1 tablet (325 mg total) 2 (two) times daily with a meal by mouth. - CBC with Differential/Platelet  11. Gastroesophageal reflux disease without esophagitis Avoid spicy foods Do not eat 2 hours prior to bedtime - pantoprazole (PROTONIX) 40 MG tablet; Take 1 tablet (40 mg total) daily by mouth.  Dispense: 30 tablet; Refill: 0    Labs pending Health maintenance reviewed Diet and exercise encouraged Continue all meds Follow up  In 3 months   [place], FNP

## 2017-10-18 NOTE — Patient Instructions (Signed)

## 2017-10-19 DIAGNOSIS — I110 Hypertensive heart disease with heart failure: Secondary | ICD-10-CM | POA: Diagnosis not present

## 2017-10-19 DIAGNOSIS — M810 Age-related osteoporosis without current pathological fracture: Secondary | ICD-10-CM | POA: Diagnosis not present

## 2017-10-19 DIAGNOSIS — J439 Emphysema, unspecified: Secondary | ICD-10-CM | POA: Diagnosis not present

## 2017-10-19 DIAGNOSIS — S72141D Displaced intertrochanteric fracture of right femur, subsequent encounter for closed fracture with routine healing: Secondary | ICD-10-CM | POA: Diagnosis not present

## 2017-10-19 DIAGNOSIS — H548 Legal blindness, as defined in USA: Secondary | ICD-10-CM | POA: Diagnosis not present

## 2017-10-19 DIAGNOSIS — I5042 Chronic combined systolic (congestive) and diastolic (congestive) heart failure: Secondary | ICD-10-CM | POA: Diagnosis not present

## 2017-10-19 LAB — LIPID PANEL
CHOLESTEROL TOTAL: 200 mg/dL — AB (ref 100–199)
Chol/HDL Ratio: 2.8 ratio (ref 0.0–4.4)
HDL: 72 mg/dL (ref >39)
LDL Calculated: 115 mg/dL — ABNORMAL HIGH (ref 0–99)
TRIGLYCERIDES: 63 mg/dL (ref 0–149)
VLDL CHOLESTEROL CAL: 13 mg/dL (ref 5–40)

## 2017-10-19 LAB — CBC WITH DIFFERENTIAL/PLATELET
BASOS ABS: 0 10*3/uL (ref 0.0–0.2)
Basos: 1 %
EOS (ABSOLUTE): 0.5 10*3/uL — AB (ref 0.0–0.4)
Eos: 11 %
Hematocrit: 34.9 % (ref 34.0–46.6)
Hemoglobin: 10.7 g/dL — ABNORMAL LOW (ref 11.1–15.9)
IMMATURE GRANS (ABS): 0 10*3/uL (ref 0.0–0.1)
IMMATURE GRANULOCYTES: 0 %
LYMPHS: 29 %
Lymphocytes Absolute: 1.3 10*3/uL (ref 0.7–3.1)
MCH: 27.5 pg (ref 26.6–33.0)
MCHC: 30.7 g/dL — ABNORMAL LOW (ref 31.5–35.7)
MCV: 90 fL (ref 79–97)
MONOS ABS: 0.6 10*3/uL (ref 0.1–0.9)
Monocytes: 12 %
NEUTROS ABS: 2.1 10*3/uL (ref 1.4–7.0)
NEUTROS PCT: 47 %
PLATELETS: 187 10*3/uL (ref 150–379)
RBC: 3.89 x10E6/uL (ref 3.77–5.28)
RDW: 13.5 % (ref 12.3–15.4)
WBC: 4.5 10*3/uL (ref 3.4–10.8)

## 2017-10-19 LAB — CMP14+EGFR
A/G RATIO: 1.8 (ref 1.2–2.2)
ALT: 4 IU/L (ref 0–32)
AST: 15 IU/L (ref 0–40)
Albumin: 3.5 g/dL (ref 3.5–4.7)
Alkaline Phosphatase: 68 IU/L (ref 39–117)
BILIRUBIN TOTAL: 0.3 mg/dL (ref 0.0–1.2)
BUN/Creatinine Ratio: 16 (ref 12–28)
BUN: 6 mg/dL — AB (ref 8–27)
CHLORIDE: 92 mmol/L — AB (ref 96–106)
CO2: 38 mmol/L — ABNORMAL HIGH (ref 20–29)
Calcium: 9.5 mg/dL (ref 8.7–10.3)
Creatinine, Ser: 0.38 mg/dL — ABNORMAL LOW (ref 0.57–1.00)
GFR calc non Af Amer: 100 mL/min/{1.73_m2} (ref >59)
GFR, EST AFRICAN AMERICAN: 115 mL/min/{1.73_m2} (ref >59)
Globulin, Total: 2 g/dL (ref 1.5–4.5)
Glucose: 81 mg/dL (ref 65–99)
POTASSIUM: 4.8 mmol/L (ref 3.5–5.2)
Sodium: 142 mmol/L (ref 134–144)
TOTAL PROTEIN: 5.5 g/dL — AB (ref 6.0–8.5)

## 2017-10-22 ENCOUNTER — Ambulatory Visit: Payer: Medicare Other | Admitting: Nurse Practitioner

## 2017-10-22 DIAGNOSIS — S72141D Displaced intertrochanteric fracture of right femur, subsequent encounter for closed fracture with routine healing: Secondary | ICD-10-CM | POA: Diagnosis not present

## 2017-10-22 DIAGNOSIS — J439 Emphysema, unspecified: Secondary | ICD-10-CM | POA: Diagnosis not present

## 2017-10-22 DIAGNOSIS — I5042 Chronic combined systolic (congestive) and diastolic (congestive) heart failure: Secondary | ICD-10-CM | POA: Diagnosis not present

## 2017-10-22 DIAGNOSIS — I110 Hypertensive heart disease with heart failure: Secondary | ICD-10-CM | POA: Diagnosis not present

## 2017-10-22 DIAGNOSIS — M810 Age-related osteoporosis without current pathological fracture: Secondary | ICD-10-CM | POA: Diagnosis not present

## 2017-10-22 DIAGNOSIS — H548 Legal blindness, as defined in USA: Secondary | ICD-10-CM | POA: Diagnosis not present

## 2017-10-23 DIAGNOSIS — I5042 Chronic combined systolic (congestive) and diastolic (congestive) heart failure: Secondary | ICD-10-CM | POA: Diagnosis not present

## 2017-10-23 DIAGNOSIS — S72141D Displaced intertrochanteric fracture of right femur, subsequent encounter for closed fracture with routine healing: Secondary | ICD-10-CM | POA: Diagnosis not present

## 2017-10-23 DIAGNOSIS — M810 Age-related osteoporosis without current pathological fracture: Secondary | ICD-10-CM | POA: Diagnosis not present

## 2017-10-23 DIAGNOSIS — J439 Emphysema, unspecified: Secondary | ICD-10-CM | POA: Diagnosis not present

## 2017-10-23 DIAGNOSIS — I110 Hypertensive heart disease with heart failure: Secondary | ICD-10-CM | POA: Diagnosis not present

## 2017-10-23 DIAGNOSIS — H548 Legal blindness, as defined in USA: Secondary | ICD-10-CM | POA: Diagnosis not present

## 2017-10-24 DIAGNOSIS — J439 Emphysema, unspecified: Secondary | ICD-10-CM | POA: Diagnosis not present

## 2017-10-24 DIAGNOSIS — I110 Hypertensive heart disease with heart failure: Secondary | ICD-10-CM | POA: Diagnosis not present

## 2017-10-24 DIAGNOSIS — I5042 Chronic combined systolic (congestive) and diastolic (congestive) heart failure: Secondary | ICD-10-CM | POA: Diagnosis not present

## 2017-10-24 DIAGNOSIS — M810 Age-related osteoporosis without current pathological fracture: Secondary | ICD-10-CM | POA: Diagnosis not present

## 2017-10-24 DIAGNOSIS — H548 Legal blindness, as defined in USA: Secondary | ICD-10-CM | POA: Diagnosis not present

## 2017-10-24 DIAGNOSIS — S72141D Displaced intertrochanteric fracture of right femur, subsequent encounter for closed fracture with routine healing: Secondary | ICD-10-CM | POA: Diagnosis not present

## 2017-10-24 NOTE — Progress Notes (Signed)
Pt notified of results

## 2017-10-25 DIAGNOSIS — H548 Legal blindness, as defined in USA: Secondary | ICD-10-CM | POA: Diagnosis not present

## 2017-10-25 DIAGNOSIS — I5042 Chronic combined systolic (congestive) and diastolic (congestive) heart failure: Secondary | ICD-10-CM | POA: Diagnosis not present

## 2017-10-25 DIAGNOSIS — M810 Age-related osteoporosis without current pathological fracture: Secondary | ICD-10-CM | POA: Diagnosis not present

## 2017-10-25 DIAGNOSIS — I110 Hypertensive heart disease with heart failure: Secondary | ICD-10-CM | POA: Diagnosis not present

## 2017-10-25 DIAGNOSIS — J439 Emphysema, unspecified: Secondary | ICD-10-CM | POA: Diagnosis not present

## 2017-10-25 DIAGNOSIS — S72141D Displaced intertrochanteric fracture of right femur, subsequent encounter for closed fracture with routine healing: Secondary | ICD-10-CM | POA: Diagnosis not present

## 2017-10-26 ENCOUNTER — Telehealth: Payer: Self-pay | Admitting: *Deleted

## 2017-10-26 DIAGNOSIS — H548 Legal blindness, as defined in USA: Secondary | ICD-10-CM | POA: Diagnosis not present

## 2017-10-26 DIAGNOSIS — I110 Hypertensive heart disease with heart failure: Secondary | ICD-10-CM | POA: Diagnosis not present

## 2017-10-26 DIAGNOSIS — M810 Age-related osteoporosis without current pathological fracture: Secondary | ICD-10-CM | POA: Diagnosis not present

## 2017-10-26 DIAGNOSIS — S72141D Displaced intertrochanteric fracture of right femur, subsequent encounter for closed fracture with routine healing: Secondary | ICD-10-CM | POA: Diagnosis not present

## 2017-10-26 DIAGNOSIS — I5042 Chronic combined systolic (congestive) and diastolic (congestive) heart failure: Secondary | ICD-10-CM | POA: Diagnosis not present

## 2017-10-26 DIAGNOSIS — J439 Emphysema, unspecified: Secondary | ICD-10-CM | POA: Diagnosis not present

## 2017-10-26 NOTE — Telephone Encounter (Signed)
[place] nurse BPs last week were 130-140 / 70s This week with nursing & PT it has been 160-180 / 80s Please advise if any changes needed

## 2017-10-27 NOTE — Telephone Encounter (Signed)
Contacted family and told them to start her back on lisinopril

## 2017-10-29 ENCOUNTER — Other Ambulatory Visit: Payer: Self-pay | Admitting: Nurse Practitioner

## 2017-10-29 DIAGNOSIS — H548 Legal blindness, as defined in USA: Secondary | ICD-10-CM | POA: Diagnosis not present

## 2017-10-29 DIAGNOSIS — S72141D Displaced intertrochanteric fracture of right femur, subsequent encounter for closed fracture with routine healing: Secondary | ICD-10-CM | POA: Diagnosis not present

## 2017-10-29 DIAGNOSIS — I110 Hypertensive heart disease with heart failure: Secondary | ICD-10-CM | POA: Diagnosis not present

## 2017-10-29 DIAGNOSIS — M810 Age-related osteoporosis without current pathological fracture: Secondary | ICD-10-CM | POA: Diagnosis not present

## 2017-10-29 DIAGNOSIS — I5042 Chronic combined systolic (congestive) and diastolic (congestive) heart failure: Secondary | ICD-10-CM | POA: Diagnosis not present

## 2017-10-29 DIAGNOSIS — J439 Emphysema, unspecified: Secondary | ICD-10-CM | POA: Diagnosis not present

## 2017-10-30 DIAGNOSIS — I5042 Chronic combined systolic (congestive) and diastolic (congestive) heart failure: Secondary | ICD-10-CM | POA: Diagnosis not present

## 2017-10-30 DIAGNOSIS — J439 Emphysema, unspecified: Secondary | ICD-10-CM | POA: Diagnosis not present

## 2017-10-30 DIAGNOSIS — I110 Hypertensive heart disease with heart failure: Secondary | ICD-10-CM | POA: Diagnosis not present

## 2017-10-30 DIAGNOSIS — H548 Legal blindness, as defined in USA: Secondary | ICD-10-CM | POA: Diagnosis not present

## 2017-10-30 DIAGNOSIS — S72141D Displaced intertrochanteric fracture of right femur, subsequent encounter for closed fracture with routine healing: Secondary | ICD-10-CM | POA: Diagnosis not present

## 2017-10-30 DIAGNOSIS — M810 Age-related osteoporosis without current pathological fracture: Secondary | ICD-10-CM | POA: Diagnosis not present

## 2017-10-31 DIAGNOSIS — H548 Legal blindness, as defined in USA: Secondary | ICD-10-CM | POA: Diagnosis not present

## 2017-10-31 DIAGNOSIS — M810 Age-related osteoporosis without current pathological fracture: Secondary | ICD-10-CM | POA: Diagnosis not present

## 2017-10-31 DIAGNOSIS — I110 Hypertensive heart disease with heart failure: Secondary | ICD-10-CM | POA: Diagnosis not present

## 2017-10-31 DIAGNOSIS — J439 Emphysema, unspecified: Secondary | ICD-10-CM | POA: Diagnosis not present

## 2017-10-31 DIAGNOSIS — S72141D Displaced intertrochanteric fracture of right femur, subsequent encounter for closed fracture with routine healing: Secondary | ICD-10-CM | POA: Diagnosis not present

## 2017-10-31 DIAGNOSIS — I5042 Chronic combined systolic (congestive) and diastolic (congestive) heart failure: Secondary | ICD-10-CM | POA: Diagnosis not present

## 2017-11-02 DIAGNOSIS — I110 Hypertensive heart disease with heart failure: Secondary | ICD-10-CM | POA: Diagnosis not present

## 2017-11-02 DIAGNOSIS — J439 Emphysema, unspecified: Secondary | ICD-10-CM | POA: Diagnosis not present

## 2017-11-02 DIAGNOSIS — M810 Age-related osteoporosis without current pathological fracture: Secondary | ICD-10-CM | POA: Diagnosis not present

## 2017-11-02 DIAGNOSIS — H548 Legal blindness, as defined in USA: Secondary | ICD-10-CM | POA: Diagnosis not present

## 2017-11-02 DIAGNOSIS — I5042 Chronic combined systolic (congestive) and diastolic (congestive) heart failure: Secondary | ICD-10-CM | POA: Diagnosis not present

## 2017-11-02 DIAGNOSIS — S72141D Displaced intertrochanteric fracture of right femur, subsequent encounter for closed fracture with routine healing: Secondary | ICD-10-CM | POA: Diagnosis not present

## 2017-11-05 ENCOUNTER — Other Ambulatory Visit: Payer: Self-pay | Admitting: *Deleted

## 2017-11-05 DIAGNOSIS — F411 Generalized anxiety disorder: Secondary | ICD-10-CM

## 2017-11-05 DIAGNOSIS — I110 Hypertensive heart disease with heart failure: Secondary | ICD-10-CM | POA: Diagnosis not present

## 2017-11-05 DIAGNOSIS — J439 Emphysema, unspecified: Secondary | ICD-10-CM | POA: Diagnosis not present

## 2017-11-05 DIAGNOSIS — I5042 Chronic combined systolic (congestive) and diastolic (congestive) heart failure: Secondary | ICD-10-CM | POA: Diagnosis not present

## 2017-11-05 DIAGNOSIS — H548 Legal blindness, as defined in USA: Secondary | ICD-10-CM | POA: Diagnosis not present

## 2017-11-05 DIAGNOSIS — M810 Age-related osteoporosis without current pathological fracture: Secondary | ICD-10-CM | POA: Diagnosis not present

## 2017-11-05 DIAGNOSIS — S72141D Displaced intertrochanteric fracture of right femur, subsequent encounter for closed fracture with routine healing: Secondary | ICD-10-CM | POA: Diagnosis not present

## 2017-11-05 MED ORDER — CITALOPRAM HYDROBROMIDE 20 MG PO TABS: 20.0000 mg | ORAL_TABLET | Freq: Every day | ORAL | 1 refills | Status: DC

## 2017-11-07 DIAGNOSIS — I110 Hypertensive heart disease with heart failure: Secondary | ICD-10-CM | POA: Diagnosis not present

## 2017-11-07 DIAGNOSIS — I5042 Chronic combined systolic (congestive) and diastolic (congestive) heart failure: Secondary | ICD-10-CM | POA: Diagnosis not present

## 2017-11-07 DIAGNOSIS — H548 Legal blindness, as defined in USA: Secondary | ICD-10-CM | POA: Diagnosis not present

## 2017-11-07 DIAGNOSIS — S72141D Displaced intertrochanteric fracture of right femur, subsequent encounter for closed fracture with routine healing: Secondary | ICD-10-CM | POA: Diagnosis not present

## 2017-11-07 DIAGNOSIS — M810 Age-related osteoporosis without current pathological fracture: Secondary | ICD-10-CM | POA: Diagnosis not present

## 2017-11-07 DIAGNOSIS — J439 Emphysema, unspecified: Secondary | ICD-10-CM | POA: Diagnosis not present

## 2017-11-09 DIAGNOSIS — S72141D Displaced intertrochanteric fracture of right femur, subsequent encounter for closed fracture with routine healing: Secondary | ICD-10-CM | POA: Diagnosis not present

## 2017-11-09 DIAGNOSIS — M810 Age-related osteoporosis without current pathological fracture: Secondary | ICD-10-CM | POA: Diagnosis not present

## 2017-11-09 DIAGNOSIS — I5042 Chronic combined systolic (congestive) and diastolic (congestive) heart failure: Secondary | ICD-10-CM | POA: Diagnosis not present

## 2017-11-09 DIAGNOSIS — I110 Hypertensive heart disease with heart failure: Secondary | ICD-10-CM | POA: Diagnosis not present

## 2017-11-09 DIAGNOSIS — J439 Emphysema, unspecified: Secondary | ICD-10-CM | POA: Diagnosis not present

## 2017-11-09 DIAGNOSIS — H548 Legal blindness, as defined in USA: Secondary | ICD-10-CM | POA: Diagnosis not present

## 2017-11-12 ENCOUNTER — Telehealth: Payer: Self-pay | Admitting: *Deleted

## 2017-11-12 DIAGNOSIS — S72141D Displaced intertrochanteric fracture of right femur, subsequent encounter for closed fracture with routine healing: Secondary | ICD-10-CM | POA: Diagnosis not present

## 2017-11-12 DIAGNOSIS — I5042 Chronic combined systolic (congestive) and diastolic (congestive) heart failure: Secondary | ICD-10-CM | POA: Diagnosis not present

## 2017-11-12 DIAGNOSIS — M810 Age-related osteoporosis without current pathological fracture: Secondary | ICD-10-CM | POA: Diagnosis not present

## 2017-11-12 DIAGNOSIS — H548 Legal blindness, as defined in USA: Secondary | ICD-10-CM | POA: Diagnosis not present

## 2017-11-12 DIAGNOSIS — I110 Hypertensive heart disease with heart failure: Secondary | ICD-10-CM | POA: Diagnosis not present

## 2017-11-12 DIAGNOSIS — J439 Emphysema, unspecified: Secondary | ICD-10-CM | POA: Diagnosis not present

## 2017-11-12 NOTE — Telephone Encounter (Signed)
Needs to be seen. How high did her BP get? What was her HR?

## 2017-11-12 NOTE — Telephone Encounter (Signed)
TC from [place] w/ [facility] PT today - PT has had some increase in BP, irregular HR with some wheezing on the right side but no cough She is not on any BP meds If any changes needed you can call Raquel Sarna w/ [facility] back or caregiver Olivia Mackie her daughter in law at [phone]

## 2017-11-13 DIAGNOSIS — S72141D Displaced intertrochanteric fracture of right femur, subsequent encounter for closed fracture with routine healing: Secondary | ICD-10-CM | POA: Diagnosis not present

## 2017-11-13 DIAGNOSIS — J439 Emphysema, unspecified: Secondary | ICD-10-CM | POA: Diagnosis not present

## 2017-11-13 DIAGNOSIS — I110 Hypertensive heart disease with heart failure: Secondary | ICD-10-CM | POA: Diagnosis not present

## 2017-11-13 DIAGNOSIS — I5042 Chronic combined systolic (congestive) and diastolic (congestive) heart failure: Secondary | ICD-10-CM | POA: Diagnosis not present

## 2017-11-13 DIAGNOSIS — H548 Legal blindness, as defined in USA: Secondary | ICD-10-CM | POA: Diagnosis not present

## 2017-11-13 DIAGNOSIS — M810 Age-related osteoporosis without current pathological fracture: Secondary | ICD-10-CM | POA: Diagnosis not present

## 2017-11-14 DIAGNOSIS — S72141D Displaced intertrochanteric fracture of right femur, subsequent encounter for closed fracture with routine healing: Secondary | ICD-10-CM | POA: Diagnosis not present

## 2017-11-14 DIAGNOSIS — H548 Legal blindness, as defined in USA: Secondary | ICD-10-CM | POA: Diagnosis not present

## 2017-11-14 DIAGNOSIS — M810 Age-related osteoporosis without current pathological fracture: Secondary | ICD-10-CM | POA: Diagnosis not present

## 2017-11-14 DIAGNOSIS — I5042 Chronic combined systolic (congestive) and diastolic (congestive) heart failure: Secondary | ICD-10-CM | POA: Diagnosis not present

## 2017-11-14 DIAGNOSIS — J439 Emphysema, unspecified: Secondary | ICD-10-CM | POA: Diagnosis not present

## 2017-11-14 DIAGNOSIS — I110 Hypertensive heart disease with heart failure: Secondary | ICD-10-CM | POA: Diagnosis not present

## 2017-11-15 DIAGNOSIS — H548 Legal blindness, as defined in USA: Secondary | ICD-10-CM | POA: Diagnosis not present

## 2017-11-15 DIAGNOSIS — I110 Hypertensive heart disease with heart failure: Secondary | ICD-10-CM | POA: Diagnosis not present

## 2017-11-15 DIAGNOSIS — S72141D Displaced intertrochanteric fracture of right femur, subsequent encounter for closed fracture with routine healing: Secondary | ICD-10-CM | POA: Diagnosis not present

## 2017-11-15 DIAGNOSIS — M810 Age-related osteoporosis without current pathological fracture: Secondary | ICD-10-CM | POA: Diagnosis not present

## 2017-11-15 DIAGNOSIS — J439 Emphysema, unspecified: Secondary | ICD-10-CM | POA: Diagnosis not present

## 2017-11-15 DIAGNOSIS — I5042 Chronic combined systolic (congestive) and diastolic (congestive) heart failure: Secondary | ICD-10-CM | POA: Diagnosis not present

## 2017-11-16 DIAGNOSIS — J439 Emphysema, unspecified: Secondary | ICD-10-CM | POA: Diagnosis not present

## 2017-11-16 DIAGNOSIS — H548 Legal blindness, as defined in USA: Secondary | ICD-10-CM | POA: Diagnosis not present

## 2017-11-16 DIAGNOSIS — I5042 Chronic combined systolic (congestive) and diastolic (congestive) heart failure: Secondary | ICD-10-CM | POA: Diagnosis not present

## 2017-11-16 DIAGNOSIS — S72141D Displaced intertrochanteric fracture of right femur, subsequent encounter for closed fracture with routine healing: Secondary | ICD-10-CM | POA: Diagnosis not present

## 2017-11-16 DIAGNOSIS — I110 Hypertensive heart disease with heart failure: Secondary | ICD-10-CM | POA: Diagnosis not present

## 2017-11-16 DIAGNOSIS — M810 Age-related osteoporosis without current pathological fracture: Secondary | ICD-10-CM | POA: Diagnosis not present

## 2017-11-21 DIAGNOSIS — H548 Legal blindness, as defined in USA: Secondary | ICD-10-CM | POA: Diagnosis not present

## 2017-11-21 DIAGNOSIS — S72141D Displaced intertrochanteric fracture of right femur, subsequent encounter for closed fracture with routine healing: Secondary | ICD-10-CM | POA: Diagnosis not present

## 2017-11-21 DIAGNOSIS — I110 Hypertensive heart disease with heart failure: Secondary | ICD-10-CM | POA: Diagnosis not present

## 2017-11-21 DIAGNOSIS — M810 Age-related osteoporosis without current pathological fracture: Secondary | ICD-10-CM | POA: Diagnosis not present

## 2017-11-21 DIAGNOSIS — J439 Emphysema, unspecified: Secondary | ICD-10-CM | POA: Diagnosis not present

## 2017-11-21 DIAGNOSIS — I5042 Chronic combined systolic (congestive) and diastolic (congestive) heart failure: Secondary | ICD-10-CM | POA: Diagnosis not present

## 2017-11-22 DIAGNOSIS — M810 Age-related osteoporosis without current pathological fracture: Secondary | ICD-10-CM | POA: Diagnosis not present

## 2017-11-22 DIAGNOSIS — S72141D Displaced intertrochanteric fracture of right femur, subsequent encounter for closed fracture with routine healing: Secondary | ICD-10-CM | POA: Diagnosis not present

## 2017-11-22 DIAGNOSIS — I110 Hypertensive heart disease with heart failure: Secondary | ICD-10-CM | POA: Diagnosis not present

## 2017-11-22 DIAGNOSIS — J439 Emphysema, unspecified: Secondary | ICD-10-CM | POA: Diagnosis not present

## 2017-11-22 DIAGNOSIS — H548 Legal blindness, as defined in USA: Secondary | ICD-10-CM | POA: Diagnosis not present

## 2017-11-22 DIAGNOSIS — I5042 Chronic combined systolic (congestive) and diastolic (congestive) heart failure: Secondary | ICD-10-CM | POA: Diagnosis not present

## 2017-11-27 ENCOUNTER — Ambulatory Visit (INDEPENDENT_AMBULATORY_CARE_PROVIDER_SITE_OTHER): Payer: Medicare Other

## 2017-11-27 DIAGNOSIS — H548 Legal blindness, as defined in USA: Secondary | ICD-10-CM | POA: Diagnosis not present

## 2017-11-27 DIAGNOSIS — I110 Hypertensive heart disease with heart failure: Secondary | ICD-10-CM

## 2017-11-27 DIAGNOSIS — J439 Emphysema, unspecified: Secondary | ICD-10-CM

## 2017-11-27 DIAGNOSIS — S72141D Displaced intertrochanteric fracture of right femur, subsequent encounter for closed fracture with routine healing: Secondary | ICD-10-CM | POA: Diagnosis not present

## 2017-11-27 DIAGNOSIS — I5042 Chronic combined systolic (congestive) and diastolic (congestive) heart failure: Secondary | ICD-10-CM | POA: Diagnosis not present

## 2017-11-27 DIAGNOSIS — M810 Age-related osteoporosis without current pathological fracture: Secondary | ICD-10-CM | POA: Diagnosis not present

## 2017-11-28 DIAGNOSIS — I5042 Chronic combined systolic (congestive) and diastolic (congestive) heart failure: Secondary | ICD-10-CM | POA: Diagnosis not present

## 2017-11-28 DIAGNOSIS — J439 Emphysema, unspecified: Secondary | ICD-10-CM | POA: Diagnosis not present

## 2017-11-28 DIAGNOSIS — S72141D Displaced intertrochanteric fracture of right femur, subsequent encounter for closed fracture with routine healing: Secondary | ICD-10-CM | POA: Diagnosis not present

## 2017-11-28 DIAGNOSIS — H548 Legal blindness, as defined in USA: Secondary | ICD-10-CM | POA: Diagnosis not present

## 2017-11-28 DIAGNOSIS — I110 Hypertensive heart disease with heart failure: Secondary | ICD-10-CM | POA: Diagnosis not present

## 2017-11-28 DIAGNOSIS — M810 Age-related osteoporosis without current pathological fracture: Secondary | ICD-10-CM | POA: Diagnosis not present

## 2017-11-29 ENCOUNTER — Other Ambulatory Visit: Payer: Self-pay | Admitting: Nurse Practitioner

## 2017-11-29 DIAGNOSIS — I110 Hypertensive heart disease with heart failure: Secondary | ICD-10-CM | POA: Diagnosis not present

## 2017-11-29 DIAGNOSIS — J439 Emphysema, unspecified: Secondary | ICD-10-CM | POA: Diagnosis not present

## 2017-11-29 DIAGNOSIS — S72141D Displaced intertrochanteric fracture of right femur, subsequent encounter for closed fracture with routine healing: Secondary | ICD-10-CM | POA: Diagnosis not present

## 2017-11-29 DIAGNOSIS — M810 Age-related osteoporosis without current pathological fracture: Secondary | ICD-10-CM | POA: Diagnosis not present

## 2017-11-29 DIAGNOSIS — I5042 Chronic combined systolic (congestive) and diastolic (congestive) heart failure: Secondary | ICD-10-CM | POA: Diagnosis not present

## 2017-11-29 DIAGNOSIS — H548 Legal blindness, as defined in USA: Secondary | ICD-10-CM | POA: Diagnosis not present

## 2017-11-29 DIAGNOSIS — K219 Gastro-esophageal reflux disease without esophagitis: Secondary | ICD-10-CM

## 2017-12-03 DIAGNOSIS — I5042 Chronic combined systolic (congestive) and diastolic (congestive) heart failure: Secondary | ICD-10-CM | POA: Diagnosis not present

## 2017-12-03 DIAGNOSIS — H548 Legal blindness, as defined in USA: Secondary | ICD-10-CM | POA: Diagnosis not present

## 2017-12-03 DIAGNOSIS — S72141D Displaced intertrochanteric fracture of right femur, subsequent encounter for closed fracture with routine healing: Secondary | ICD-10-CM | POA: Diagnosis not present

## 2017-12-03 DIAGNOSIS — J439 Emphysema, unspecified: Secondary | ICD-10-CM | POA: Diagnosis not present

## 2017-12-03 DIAGNOSIS — M810 Age-related osteoporosis without current pathological fracture: Secondary | ICD-10-CM | POA: Diagnosis not present

## 2017-12-03 DIAGNOSIS — I110 Hypertensive heart disease with heart failure: Secondary | ICD-10-CM | POA: Diagnosis not present

## 2017-12-05 DIAGNOSIS — H548 Legal blindness, as defined in USA: Secondary | ICD-10-CM | POA: Diagnosis not present

## 2017-12-05 DIAGNOSIS — I5042 Chronic combined systolic (congestive) and diastolic (congestive) heart failure: Secondary | ICD-10-CM | POA: Diagnosis not present

## 2017-12-05 DIAGNOSIS — M810 Age-related osteoporosis without current pathological fracture: Secondary | ICD-10-CM | POA: Diagnosis not present

## 2017-12-05 DIAGNOSIS — S72141D Displaced intertrochanteric fracture of right femur, subsequent encounter for closed fracture with routine healing: Secondary | ICD-10-CM | POA: Diagnosis not present

## 2017-12-05 DIAGNOSIS — I110 Hypertensive heart disease with heart failure: Secondary | ICD-10-CM | POA: Diagnosis not present

## 2017-12-05 DIAGNOSIS — J439 Emphysema, unspecified: Secondary | ICD-10-CM | POA: Diagnosis not present

## 2017-12-06 DIAGNOSIS — H548 Legal blindness, as defined in USA: Secondary | ICD-10-CM | POA: Diagnosis not present

## 2017-12-06 DIAGNOSIS — M810 Age-related osteoporosis without current pathological fracture: Secondary | ICD-10-CM | POA: Diagnosis not present

## 2017-12-06 DIAGNOSIS — J439 Emphysema, unspecified: Secondary | ICD-10-CM | POA: Diagnosis not present

## 2017-12-06 DIAGNOSIS — I5042 Chronic combined systolic (congestive) and diastolic (congestive) heart failure: Secondary | ICD-10-CM | POA: Diagnosis not present

## 2017-12-06 DIAGNOSIS — S72141D Displaced intertrochanteric fracture of right femur, subsequent encounter for closed fracture with routine healing: Secondary | ICD-10-CM | POA: Diagnosis not present

## 2017-12-06 DIAGNOSIS — I110 Hypertensive heart disease with heart failure: Secondary | ICD-10-CM | POA: Diagnosis not present

## 2017-12-06 NOTE — Telephone Encounter (Signed)
Left detailed message on caretakers phone.

## 2017-12-07 DIAGNOSIS — J439 Emphysema, unspecified: Secondary | ICD-10-CM | POA: Diagnosis not present

## 2017-12-07 DIAGNOSIS — H548 Legal blindness, as defined in USA: Secondary | ICD-10-CM | POA: Diagnosis not present

## 2017-12-07 DIAGNOSIS — M810 Age-related osteoporosis without current pathological fracture: Secondary | ICD-10-CM | POA: Diagnosis not present

## 2017-12-07 DIAGNOSIS — I110 Hypertensive heart disease with heart failure: Secondary | ICD-10-CM | POA: Diagnosis not present

## 2017-12-07 DIAGNOSIS — I5042 Chronic combined systolic (congestive) and diastolic (congestive) heart failure: Secondary | ICD-10-CM | POA: Diagnosis not present

## 2017-12-07 DIAGNOSIS — S72141D Displaced intertrochanteric fracture of right femur, subsequent encounter for closed fracture with routine healing: Secondary | ICD-10-CM | POA: Diagnosis not present

## 2018-01-24 ENCOUNTER — Encounter: Payer: Self-pay | Admitting: Nurse Practitioner

## 2018-01-24 ENCOUNTER — Ambulatory Visit (INDEPENDENT_AMBULATORY_CARE_PROVIDER_SITE_OTHER): Payer: Medicare Other | Admitting: Nurse Practitioner

## 2018-01-24 VITALS — BP 176/77 | HR 64 | Temp 97.1°F | Ht 64.0 in | Wt 115.0 lb

## 2018-01-24 DIAGNOSIS — J449 Chronic obstructive pulmonary disease, unspecified: Secondary | ICD-10-CM | POA: Diagnosis not present

## 2018-01-24 DIAGNOSIS — M810 Age-related osteoporosis without current pathological fracture: Secondary | ICD-10-CM

## 2018-01-24 DIAGNOSIS — F411 Generalized anxiety disorder: Secondary | ICD-10-CM

## 2018-01-24 DIAGNOSIS — J439 Emphysema, unspecified: Secondary | ICD-10-CM

## 2018-01-24 DIAGNOSIS — E785 Hyperlipidemia, unspecified: Secondary | ICD-10-CM | POA: Diagnosis not present

## 2018-01-24 DIAGNOSIS — I10 Essential (primary) hypertension: Secondary | ICD-10-CM | POA: Diagnosis not present

## 2018-01-24 DIAGNOSIS — I5042 Chronic combined systolic (congestive) and diastolic (congestive) heart failure: Secondary | ICD-10-CM | POA: Diagnosis not present

## 2018-01-24 DIAGNOSIS — K219 Gastro-esophageal reflux disease without esophagitis: Secondary | ICD-10-CM | POA: Diagnosis not present

## 2018-01-24 DIAGNOSIS — D500 Iron deficiency anemia secondary to blood loss (chronic): Secondary | ICD-10-CM | POA: Diagnosis not present

## 2018-01-24 DIAGNOSIS — Z87891 Personal history of nicotine dependence: Secondary | ICD-10-CM

## 2018-01-24 MED ORDER — IPRATROPIUM-ALBUTEROL 20-100 MCG/ACT IN AERS: 1.0000 | INHALATION_SPRAY | Freq: Four times a day (QID) | RESPIRATORY_TRACT | 5 refills | Status: DC | PRN

## 2018-01-24 MED ORDER — FERROUS SULFATE 325 (65 FE) MG PO TABS: 325.0000 mg | ORAL_TABLET | Freq: Two times a day (BID) | ORAL | Status: DC

## 2018-01-24 MED ORDER — PANTOPRAZOLE SODIUM 40 MG PO TBEC: 40.0000 mg | DELAYED_RELEASE_TABLET | Freq: Every day | ORAL | 4 refills | Status: DC

## 2018-01-24 MED ORDER — CITALOPRAM HYDROBROMIDE 20 MG PO TABS: 20.0000 mg | ORAL_TABLET | Freq: Every day | ORAL | 1 refills | Status: DC

## 2018-01-24 MED ORDER — LISINOPRIL 20 MG PO TABS: 20.0000 mg | ORAL_TABLET | Freq: Every day | ORAL | 3 refills | Status: DC

## 2018-01-24 MED ORDER — MIRTAZAPINE 15 MG PO TABS: 15.0000 mg | ORAL_TABLET | Freq: Every day | ORAL | 5 refills | Status: DC

## 2018-01-24 NOTE — Patient Instructions (Signed)

## 2018-01-24 NOTE — Progress Notes (Signed)
Subjective:    Patient ID: Tiffany Maxwell, female    DOB: [birth year], 84 y.o.   MRN: [id number]  HPI   Tiffany Maxwell is here today for follow up of chronic medical problem.  Outpatient Encounter Medications as of 01/24/2018  Medication Sig  . Calcium Carbonate-Vitamin D (CALCIUM 600+D3) 600-400 MG-UNIT per tablet Take 1 tablet by mouth 2 (two) times daily.  . citalopram (CELEXA) 20 MG tablet Take 1 tablet (20 mg total) by mouth daily.  Marland Kitchen enoxaparin (LOVENOX) 40 MG/0.4ML injection Inject 0.4 mLs (40 mg total) into the skin daily. (Patient taking differently: Inject 40 mg into the skin daily. END DATE: 07/23/17)  . feeding supplement, ENSURE ENLIVE, (ENSURE ENLIVE) LIQD Take 237 mLs by mouth 2 (two) times daily between meals.  . ferrous sulfate 325 (65 FE) MG tablet Take 1 tablet (325 mg total) 2 (two) times daily with a meal by mouth.  Marland Kitchen HYDROcodone-acetaminophen (NORCO/VICODIN) 5-325 MG tablet Take 1-2 tablets by mouth every 6 (six) hours as needed for moderate pain. (Patient taking differently: Take 1-2 tablets by mouth every 6 (six) hours as needed for moderate pain or severe pain. )  . Ipratropium-Albuterol (COMBIVENT RESPIMAT) 20-100 MCG/ACT AERS respimat Inhale 1 puff every 6 (six) hours as needed into the lungs for wheezing.  . mirtazapine (REMERON) 15 MG tablet Take 1 tablet (15 mg total) at bedtime by mouth.  . OXYGEN Inhale 4 L into the lungs See admin instructions. TO MAINTAIN SATS OF 93% OR GREATER  . pantoprazole (PROTONIX) 40 MG tablet Take 1 tablet (40 mg total) daily by mouth.  . polyethylene glycol powder (GLYCOLAX/MIRALAX) powder 17 GRAMS (1 CAPFUL) ONCE A DAY AS DIRECTED (Patient taking differently: Mix and drink once a day: 17 grams of powder into 6-8 ounces of juice or water)  . polyethylene glycol powder (GLYCOLAX/MIRALAX) powder 17 GRAMS (1 CAPFUL) ONCE A DAY AS DIRECTED  . VENTOLIN HFA 108 (90 Base) MCG/ACT inhaler 2 PUFFS EVERY 6 HOURS AS NEEDED FOR WHEEZING OR  SHORTNESS OF BREATH (Patient taking differently: Inhale 2 puffs into the lungs every 6 hours as needed for wheezing or shortness of breath)  . vitamin C (ASCORBIC ACID) 500 MG tablet Take 500 mg by mouth 2 (two) times daily.     1. Chronic combined systolic (EF 83%) and diastolic CHF, NYHA class 1 (HCC)  Is always sob. Wears o2 24 /7 via cannulla.  2. Essential hypertension No c/o chest pain or headache. Does not check blood pressure at home  3. Chronic obstructive pulmonary disease, unspecified COPD type ([place])  again is on oxygen at home. Often has cough.  4. Pulmonary emphysema, unspecified emphysema type ([place])  Again stays sob with any activity  5. Age-related osteoporosis without current pathological fracture  does not do much walking around because she is afraid she will fall again and break er hip.  6. GAD (generalized anxiety disorder)  Stays anxious- is on celexa which does help. She says since she came home from [place] her daughter is being real good for her which has helped with her anxiety.  7. Hyperlipidemia, unspecified hyperlipidemia type  Just eats whatever her daughter in law feeds her.  8. History of tobacco use  She quit smoking in 2013    New complaints: None today  Social history: Lives with son and daughter in [place].   Review of Systems  Constitutional: Negative for activity change and appetite change.  HENT: Negative.   Eyes: Negative  for pain.  Respiratory: Positive for cough and shortness of breath.   Cardiovascular: Negative for chest pain, palpitations and leg swelling.  Gastrointestinal: Negative for abdominal pain.  Endocrine: Negative for polydipsia.  Genitourinary: Negative.   Musculoskeletal: Positive for arthralgias and back pain.  Skin: Negative for rash.  Neurological: Negative for dizziness, weakness and headaches.  Hematological: Does not bruise/bleed easily.  Psychiatric/Behavioral: Negative.   All other systems reviewed and are  negative.      Objective:   Physical Exam  Constitutional: She is oriented to person, place, and time. She appears well-developed and well-nourished.  HENT:  Nose: Nose normal.  Mouth/Throat: Oropharynx is clear and moist.  Eyes: EOM are normal.  Neck: Trachea normal, normal range of motion and full passive range of motion without pain. Neck supple. No JVD present. Carotid bruit is not present. No thyromegaly present.  Cardiovascular: Normal rate, regular rhythm, normal heart sounds and intact distal pulses. Exam reveals no gallop and no friction rub.  No murmur heard. Pulmonary/Chest: Effort normal and breath sounds normal.  Abdominal: Soft. Bowel sounds are normal. She exhibits no distension and no mass. There is no tenderness.  Musculoskeletal: Normal range of motion.  Lymphadenopathy:    She has no cervical adenopathy.  Neurological: She is alert and oriented to person, place, and time. She has normal reflexes.  Skin: Skin is warm and dry.  Psychiatric: She has a normal mood and affect. Her behavior is normal. Judgment and thought content normal.    BP (!) 176/77   Pulse 64   Temp (!) 97.1 F (36.2 C) (Oral)   Ht 5' 4"  (1.626 m)   Wt 115 lb (52.2 kg)   BMI 19.74 kg/m        Assessment & Plan:  1. Chronic combined systolic (EF 23%) and diastolic CHF, NYHA class 1 (HCC) Continue o2 as rx  2. Essential hypertension Low sodium diet Added lisinopril 54m daily Keep diary of blood pressure - CMP14+EGFR  3. Chronic obstructive pulmonary disease, unspecified COPD type ([place] Continue inhalers  4. Pulmonary emphysema, unspecified emphysema type ([place]  5. Age-related osteoporosis without current pathological fracture Be careful when walking  6. GAD (generalized anxiety disorder) Stress management - citalopram (CELEXA) 20 MG tablet; Take 1 tablet (20 mg total) by mouth daily.  Dispense: 90 tablet; Refill: 1  7. Hyperlipidemia, unspecified hyperlipidemia type Low fat  diet - Lipid panel  8. History of tobacco use  9. COPD with chronic bronchitis ([place] - Ipratropium-Albuterol (COMBIVENT RESPIMAT) 20-100 MCG/ACT AERS respimat; Inhale 1 puff into the lungs every 6 (six) hours as needed for wheezing.  Dispense: 8 g; Refill: 5  10. Gastroesophageal reflux disease without esophagitis Avoid spicy foods Do not eat 2 hours prior to bedtime - pantoprazole (PROTONIX) 40 MG tablet; Take 1 tablet (40 mg total) by mouth daily.  Dispense: 30 tablet; Refill: 4  11. Iron deficiency anemia due to chronic blood loss - ferrous sulfate 325 (65 FE) MG tablet; Take 1 tablet (325 mg total) by mouth 2 (two) times daily with a meal.    Labs pending Health maintenance reviewed Diet and exercise encouraged Continue all meds Follow up  In 3 months   MEl Tumbao FNP

## 2018-01-25 LAB — CMP14+EGFR
A/G RATIO: 1.9 (ref 1.2–2.2)
ALT: 5 IU/L (ref 0–32)
AST: 20 IU/L (ref 0–40)
Albumin: 4.1 g/dL (ref 3.5–4.7)
Alkaline Phosphatase: 77 IU/L (ref 39–117)
BUN/Creatinine Ratio: 22 (ref 12–28)
BUN: 7 mg/dL — ABNORMAL LOW (ref 8–27)
Bilirubin Total: 0.4 mg/dL (ref 0.0–1.2)
CALCIUM: 9.9 mg/dL (ref 8.7–10.3)
CO2: 38 mmol/L — ABNORMAL HIGH (ref 20–29)
Chloride: 93 mmol/L — ABNORMAL LOW (ref 96–106)
Creatinine, Ser: 0.32 mg/dL — ABNORMAL LOW (ref 0.57–1.00)
GFR, EST AFRICAN AMERICAN: 122 mL/min/{1.73_m2} (ref >59)
GFR, EST NON AFRICAN AMERICAN: 105 mL/min/{1.73_m2} (ref >59)
Globulin, Total: 2.2 g/dL (ref 1.5–4.5)
Glucose: 80 mg/dL (ref 65–99)
POTASSIUM: 3.9 mmol/L (ref 3.5–5.2)
Sodium: 143 mmol/L (ref 134–144)
TOTAL PROTEIN: 6.3 g/dL (ref 6.0–8.5)

## 2018-01-25 LAB — LIPID PANEL
CHOL/HDL RATIO: 2.5 ratio (ref 0.0–4.4)
Cholesterol, Total: 224 mg/dL — ABNORMAL HIGH (ref 100–199)
HDL: 89 mg/dL (ref >39)
LDL Calculated: 117 mg/dL — ABNORMAL HIGH (ref 0–99)
Triglycerides: 88 mg/dL (ref 0–149)
VLDL CHOLESTEROL CAL: 18 mg/dL (ref 5–40)

## 2018-02-05 ENCOUNTER — Encounter: Payer: Self-pay | Admitting: *Deleted

## 2018-03-12 DIAGNOSIS — B351 Tinea unguium: Secondary | ICD-10-CM | POA: Diagnosis not present

## 2018-03-12 DIAGNOSIS — M79676 Pain in unspecified toe(s): Secondary | ICD-10-CM | POA: Diagnosis not present

## 2018-04-22 ENCOUNTER — Encounter: Payer: Self-pay | Admitting: Nurse Practitioner

## 2018-04-22 ENCOUNTER — Ambulatory Visit (INDEPENDENT_AMBULATORY_CARE_PROVIDER_SITE_OTHER): Payer: Medicare Other | Admitting: Nurse Practitioner

## 2018-04-22 VITALS — BP 138/64 | HR 98 | Temp 98.1°F | Ht 64.0 in | Wt 115.2 lb

## 2018-04-22 DIAGNOSIS — I5042 Chronic combined systolic (congestive) and diastolic (congestive) heart failure: Secondary | ICD-10-CM | POA: Diagnosis not present

## 2018-04-22 DIAGNOSIS — K219 Gastro-esophageal reflux disease without esophagitis: Secondary | ICD-10-CM | POA: Diagnosis not present

## 2018-04-22 DIAGNOSIS — F5101 Primary insomnia: Secondary | ICD-10-CM | POA: Diagnosis not present

## 2018-04-22 DIAGNOSIS — J4489 Other specified chronic obstructive pulmonary disease: Secondary | ICD-10-CM

## 2018-04-22 DIAGNOSIS — D500 Iron deficiency anemia secondary to blood loss (chronic): Secondary | ICD-10-CM | POA: Diagnosis not present

## 2018-04-22 DIAGNOSIS — F411 Generalized anxiety disorder: Secondary | ICD-10-CM | POA: Diagnosis not present

## 2018-04-22 DIAGNOSIS — E785 Hyperlipidemia, unspecified: Secondary | ICD-10-CM

## 2018-04-22 DIAGNOSIS — J449 Chronic obstructive pulmonary disease, unspecified: Secondary | ICD-10-CM | POA: Diagnosis not present

## 2018-04-22 DIAGNOSIS — I10 Essential (primary) hypertension: Secondary | ICD-10-CM

## 2018-04-22 DIAGNOSIS — M810 Age-related osteoporosis without current pathological fracture: Secondary | ICD-10-CM

## 2018-04-22 DIAGNOSIS — R6889 Other general symptoms and signs: Secondary | ICD-10-CM | POA: Diagnosis not present

## 2018-04-22 MED ORDER — CITALOPRAM HYDROBROMIDE 20 MG PO TABS: 20.0000 mg | ORAL_TABLET | Freq: Every day | ORAL | 1 refills | Status: DC

## 2018-04-22 MED ORDER — IPRATROPIUM-ALBUTEROL 20-100 MCG/ACT IN AERS: 1.0000 | INHALATION_SPRAY | Freq: Four times a day (QID) | RESPIRATORY_TRACT | 5 refills | Status: AC | PRN

## 2018-04-22 MED ORDER — FERROUS SULFATE 325 (65 FE) MG PO TABS: 325.0000 mg | ORAL_TABLET | Freq: Two times a day (BID) | ORAL | Status: DC

## 2018-04-22 MED ORDER — PANTOPRAZOLE SODIUM 40 MG PO TBEC: 40.0000 mg | DELAYED_RELEASE_TABLET | Freq: Every day | ORAL | 4 refills | Status: DC

## 2018-04-22 MED ORDER — MIRTAZAPINE 15 MG PO TABS: 15.0000 mg | ORAL_TABLET | Freq: Every day | ORAL | 5 refills | Status: DC

## 2018-04-22 MED ORDER — LISINOPRIL 20 MG PO TABS
20.0000 mg | ORAL_TABLET | Freq: Every day | ORAL | 3 refills | Status: DC
Start: 2018-04-22 — End: 2019-03-11

## 2018-04-22 NOTE — Patient Instructions (Signed)

## 2018-04-22 NOTE — Progress Notes (Signed)
Subjective:    Patient ID: Tiffany Maxwell, female    DOB: [birth year], 84 y.o.   MRN: [id number]    Chief Complaint: Medical Management of Chronic conditions  HPI:  1. Essential hypertension  No c/o chest pain or headache.  2. Chronic combined systolic (EF 06%) and diastolic CHF, NYHA class 1 (HCC)  has not seen cardiology since she was in the hospital with her hip fracture. She says she is doing well and she does not want to see them right now.  3. Chronic obstructive pulmonary disease, unspecified COPD type ([place])  Uses combivent daily. She is on oxygen 24/7. She had a long smoking history but is no longer smoking.  4. Age-related osteoporosis without current pathological fracture  It has been awhile since she had a bone density test. She refuses to have anymore. She does no weight bearing exercise because she is afraid she will fall. Uses a walker to get round  5. Hyperlipidemia, unspecified hyperlipidemia type  Does not watch diet. She has to eat whatever her son and daughter in law give her  36. GAD (generalized anxiety disorder)  Is on celexa daily without side effects. GAD 7 : Generalized Anxiety Score 04/22/2018  Nervous, Anxious, on Edge 2  Control/stop worrying 3  Worry too much - different things 3  Trouble relaxing 1  Restless 1  Easily annoyed or irritable 2  Afraid - awful might happen 1  Total GAD 7 Score 13  Anxiety Difficulty Somewhat difficult      7.      insomnia          remeron nightly to sleep. No side effects. Says that she rests well. 8.      Iron deficiency anemia          Needs labs checked today   Outpatient Encounter Medications as of 04/22/2018  Medication Sig  . Calcium Carbonate-Vitamin D (CALCIUM 600+D3) 600-400 MG-UNIT per tablet Take 1 tablet by mouth 2 (two) times daily.  . citalopram (CELEXA) 20 MG tablet Take 1 tablet (20 mg total) by mouth daily.  . feeding supplement, ENSURE ENLIVE, (ENSURE ENLIVE) LIQD Take 237 mLs by mouth 2 (two)  times daily between meals.  . ferrous sulfate 325 (65 FE) MG tablet Take 1 tablet (325 mg total) by mouth 2 (two) times daily with a meal.  . HYDROcodone-acetaminophen (NORCO/VICODIN) 5-325 MG tablet Take 1-2 tablets by mouth every 6 (six) hours as needed for moderate pain. (Patient taking differently: Take 1-2 tablets by mouth every 6 (six) hours as needed for moderate pain or severe pain. )  . Ipratropium-Albuterol (COMBIVENT RESPIMAT) 20-100 MCG/ACT AERS respimat Inhale 1 puff into the lungs every 6 (six) hours as needed for wheezing.  Marland Kitchen lisinopril (PRINIVIL,ZESTRIL) 20 MG tablet Take 1 tablet (20 mg total) by mouth daily.  . mirtazapine (REMERON) 15 MG tablet Take 1 tablet (15 mg total) by mouth at bedtime.  . OXYGEN Inhale 4 L into the lungs See admin instructions. TO MAINTAIN SATS OF 93% OR GREATER  . pantoprazole (PROTONIX) 40 MG tablet Take 1 tablet (40 mg total) by mouth daily.  . polyethylene glycol powder (GLYCOLAX/MIRALAX) powder 17 GRAMS (1 CAPFUL) ONCE A DAY AS DIRECTED (Patient taking differently: Mix and drink once a day: 17 grams of powder into 6-8 ounces of juice or water)  . VENTOLIN HFA 108 (90 Base) MCG/ACT inhaler 2 PUFFS EVERY 6 HOURS AS NEEDED FOR WHEEZING OR SHORTNESS OF BREATH (Patient taking differently: Inhale  2 puffs into the lungs every 6 hours as needed for wheezing or shortness of breath)  . vitamin C (ASCORBIC ACID) 500 MG tablet Take 500 mg by mouth 2 (two) times daily.       New complaints: No complaints toda  Social history: Lives with her son and daughter in law   Review of Systems  Constitutional: Negative for activity change and appetite change.  HENT: Negative.   Eyes: Negative for pain.  Respiratory: Negative for shortness of breath.   Cardiovascular: Negative for chest pain, palpitations and leg swelling.  Gastrointestinal: Negative for abdominal pain.  Endocrine: Negative for polydipsia.  Genitourinary: Negative.   Skin: Negative for rash.    Neurological: Negative for dizziness, weakness and headaches.  Hematological: Does not bruise/bleed easily.  Psychiatric/Behavioral: Negative.   All other systems reviewed and are negative.      Objective:   Physical Exam  Constitutional: She is oriented to person, place, and time.  HENT:  Head: Normocephalic.  Nose: Nose normal.  Mouth/Throat: Oropharynx is clear and moist.  Eyes: Pupils are equal, round, and reactive to light. EOM are normal.  Neck: Normal range of motion. Neck supple. No JVD present. Carotid bruit is not present.  Cardiovascular: Normal rate, regular rhythm, normal heart sounds and intact distal pulses.  Pulmonary/Chest: Effort normal. No respiratory distress. She has no wheezes. She has rales (fine crackles bil lower bases). She exhibits no tenderness.  o2 via nasal cannula at  2l  Abdominal: Soft. Normal appearance, normal aorta and bowel sounds are normal. She exhibits no distension, no abdominal bruit, no pulsatile midline mass and no mass. There is no splenomegaly or hepatomegaly. There is no tenderness.  Musculoskeletal: Normal range of motion. She exhibits no edema.  Lymphadenopathy:    She has no cervical adenopathy.  Neurological: She is alert and oriented to person, place, and time. She has normal reflexes.  Skin: Skin is warm and dry.  Psychiatric: She has a normal mood and affect. Her behavior is normal. Judgment and thought content normal.   BP 138/64   Pulse 98   Temp 98.1 F (36.7 C) (Oral)   Ht 5\' 4"  (1.626 m)   Wt 115 lb 4 oz (52.3 kg)   SpO2 90%   BMI 19.78 kg/m      Assessment & Plan:  Tiffany Maxwell comes in today with chief complaint of Medical Management of Chronic Issues   Diagnosis and orders addressed:  1. Essential hypertension Low sodium diet - lisinopril (PRINIVIL,ZESTRIL) 20 MG tablet; Take 1 tablet (20 mg total) by mouth daily.  Dispense: 90 tablet; Refill: 3  2. Hyperlipidemia, unspecified hyperlipidemia type Low  fat diet  3. GAD (generalized anxiety disorder) Stress management - citalopram (CELEXA) 20 MG tablet; Take 1 tablet (20 mg total) by mouth daily.  Dispense: 90 tablet; Refill: 1  4. Chronic combined systolic (EF 95%) and diastolic CHF, NYHA class 1 (HCC) Refuses to see cardiology  5. Chronic obstructive pulmonary disease, unspecified COPD type ([place]) Continue combivent and oxygen as rx  6. Age-related osteoporosis without current pathological fracture Can not do weight bearing exercises Refuses dexascan  7. Primary insomnia Bedtime routine - mirtazapine (REMERON) 15 MG tablet; Take 1 tablet (15 mg total) by mouth at bedtime.  Dispense: 30 tablet; Refill: 5  8. COPD with chronic bronchitis ([place]) - Ipratropium-Albuterol (COMBIVENT RESPIMAT) 20-100 MCG/ACT AERS respimat; Inhale 1 puff into the lungs every 6 (six) hours as needed for wheezing.  Dispense: 8 g;  Refill: 5  9. Gastroesophageal reflux disease without esophagitis Avoid spicy foods Do not eat 2 hours prior to bedtime - pantoprazole (PROTONIX) 40 MG tablet; Take 1 tablet (40 mg total) by mouth daily.  Dispense: 30 tablet; Refill: 4  10. Iron deficiency anemia due to chronic blood loss Labs pending - ferrous sulfate 325 (65 FE) MG tablet; Take 1 tablet (325 mg total) by mouth 2 (two) times daily with a meal.   Labs pending Health Maintenance reviewed Diet and exercise encouraged  Follow up plan: 3 months   [place], FNP

## 2018-04-22 NOTE — Addendum Note (Signed)
Addended by: [name] on: 04/22/2018 12:24 PM   Modules accepted: Orders

## 2018-04-23 LAB — ANEMIA PROFILE B
BASOS ABS: 0 10*3/uL (ref 0.0–0.2)
Basos: 1 %
EOS (ABSOLUTE): 0.7 10*3/uL — ABNORMAL HIGH (ref 0.0–0.4)
Eos: 11 %
Ferritin: 97 ng/mL (ref 15–150)
Hematocrit: 39.6 % (ref 34.0–46.6)
Hemoglobin: 12.3 g/dL (ref 11.1–15.9)
IMMATURE GRANULOCYTES: 0 %
IRON: 66 ug/dL (ref 27–139)
Immature Grans (Abs): 0 10*3/uL (ref 0.0–0.1)
Iron Saturation: 24 % (ref 15–55)
Lymphocytes Absolute: 1.3 10*3/uL (ref 0.7–3.1)
Lymphs: 21 %
MCH: 28.5 pg (ref 26.6–33.0)
MCHC: 31.1 g/dL — ABNORMAL LOW (ref 31.5–35.7)
MCV: 92 fL (ref 79–97)
MONOCYTES: 12 %
Monocytes Absolute: 0.7 10*3/uL (ref 0.1–0.9)
NEUTROS PCT: 55 %
Neutrophils Absolute: 3.4 10*3/uL (ref 1.4–7.0)
PLATELETS: 242 10*3/uL (ref 150–379)
RBC: 4.32 x10E6/uL (ref 3.77–5.28)
RDW: 12.8 % (ref 12.3–15.4)
RETIC CT PCT: 1.1 % (ref 0.6–2.6)
TIBC: 272 ug/dL (ref 250–450)
UIBC: 206 ug/dL (ref 118–369)
Vitamin B-12: 349 pg/mL (ref 232–1245)
WBC: 6.2 10*3/uL (ref 3.4–10.8)

## 2018-04-23 LAB — CMP14+EGFR
A/G RATIO: 1.8 (ref 1.2–2.2)
ALT: 7 IU/L (ref 0–32)
AST: 19 IU/L (ref 0–40)
Albumin: 3.9 g/dL (ref 3.5–4.7)
Alkaline Phosphatase: 91 IU/L (ref 39–117)
BUN/Creatinine Ratio: 16 (ref 12–28)
BUN: 8 mg/dL (ref 8–27)
Bilirubin Total: 0.3 mg/dL (ref 0.0–1.2)
CALCIUM: 9.3 mg/dL (ref 8.7–10.3)
CO2: 32 mmol/L — ABNORMAL HIGH (ref 20–29)
Chloride: 97 mmol/L (ref 96–106)
Creatinine, Ser: 0.5 mg/dL — ABNORMAL LOW (ref 0.57–1.00)
GFR, EST AFRICAN AMERICAN: 104 mL/min/{1.73_m2} (ref >59)
GFR, EST NON AFRICAN AMERICAN: 90 mL/min/{1.73_m2} (ref >59)
Globulin, Total: 2.2 g/dL (ref 1.5–4.5)
Glucose: 117 mg/dL — ABNORMAL HIGH (ref 65–99)
POTASSIUM: 4.2 mmol/L (ref 3.5–5.2)
Sodium: 144 mmol/L (ref 134–144)
TOTAL PROTEIN: 6.1 g/dL (ref 6.0–8.5)

## 2018-04-23 LAB — LIPID PANEL
CHOL/HDL RATIO: 2.4 ratio (ref 0.0–4.4)
Cholesterol, Total: 207 mg/dL — ABNORMAL HIGH (ref 100–199)
HDL: 86 mg/dL (ref >39)
LDL CALC: 109 mg/dL — AB (ref 0–99)
Triglycerides: 62 mg/dL (ref 0–149)
VLDL CHOLESTEROL CAL: 12 mg/dL (ref 5–40)

## 2018-04-25 ENCOUNTER — Ambulatory Visit: Payer: Medicare Other | Admitting: Nurse Practitioner

## 2018-04-26 ENCOUNTER — Ambulatory Visit: Payer: Medicare Other | Admitting: Nurse Practitioner

## 2018-04-29 ENCOUNTER — Telehealth: Payer: Self-pay | Admitting: Nurse Practitioner

## 2018-04-29 NOTE — Telephone Encounter (Signed)
Pt's sister is calling saying pt would like to take cholesterol medication now. She had declined it in the past but has changed her mind. Please send it in to [facility].

## 2018-04-30 NOTE — Telephone Encounter (Signed)
Please check with patient and make sure she wants to take

## 2018-04-30 NOTE — Telephone Encounter (Signed)
Left message for Tiffany Maxwell to return call

## 2018-05-01 ENCOUNTER — Telehealth: Payer: Self-pay | Admitting: Nurse Practitioner

## 2018-05-01 NOTE — Telephone Encounter (Signed)
Refer to phone note from 5/20- MMM wants to make sure patient wants to take cholesterol medication before sending it in. Message was left for patient and no call back. I also called pt and lm to call back.

## 2018-05-17 NOTE — Telephone Encounter (Signed)
Multiple attempts have been made to contact pt without a return call, will close encounter.

## 2018-05-17 NOTE — Telephone Encounter (Signed)
Multiple calls open regarding this and multiple attempts have been made to contact pt without a return call, will close encounter.

## 2018-05-20 DIAGNOSIS — J441 Chronic obstructive pulmonary disease with (acute) exacerbation: Secondary | ICD-10-CM | POA: Diagnosis not present

## 2018-05-20 DIAGNOSIS — F419 Anxiety disorder, unspecified: Secondary | ICD-10-CM | POA: Diagnosis not present

## 2018-05-30 ENCOUNTER — Ambulatory Visit (INDEPENDENT_AMBULATORY_CARE_PROVIDER_SITE_OTHER): Payer: Medicare Other | Admitting: Nurse Practitioner

## 2018-05-30 ENCOUNTER — Ambulatory Visit (INDEPENDENT_AMBULATORY_CARE_PROVIDER_SITE_OTHER): Payer: Medicare Other

## 2018-05-30 ENCOUNTER — Encounter: Payer: Self-pay | Admitting: Nurse Practitioner

## 2018-05-30 VITALS — BP 146/78 | HR 76 | Temp 98.5°F | Ht 64.0 in | Wt 112.0 lb

## 2018-05-30 DIAGNOSIS — J441 Chronic obstructive pulmonary disease with (acute) exacerbation: Secondary | ICD-10-CM

## 2018-05-30 DIAGNOSIS — Z09 Encounter for follow-up examination after completed treatment for conditions other than malignant neoplasm: Secondary | ICD-10-CM | POA: Diagnosis not present

## 2018-05-30 DIAGNOSIS — R0781 Pleurodynia: Secondary | ICD-10-CM | POA: Diagnosis not present

## 2018-05-30 NOTE — Progress Notes (Signed)
   Subjective:    Patient ID: Tiffany Maxwell, female    DOB: 04-05-1934, 84 y.o.   MRN: [id number]   Chief Complaint: Hospitalization Follow-up   HPI Patient went to urgent care in [place] on 05/20/18 with SOB. She was diagnosed with COPD exacerbation. Her o2 sat was in high 80's despite being on oxygen at home. She was given zithromax and prednisone and was discharged home. Patient says she is doing much better. Not coughing. Sh eis c/o rib pain on left.    Review of Systems  Constitutional: Negative for activity change and appetite change.  HENT: Negative.   Eyes: Negative for pain.  Respiratory: Negative for shortness of breath.   Cardiovascular: Negative for chest pain, palpitations and leg swelling.  Gastrointestinal: Negative for abdominal pain.  Endocrine: Negative for polydipsia.  Genitourinary: Negative.   Skin: Negative for rash.  Neurological: Negative for dizziness, weakness and headaches.  Hematological: Does not bruise/bleed easily.  Psychiatric/Behavioral: Negative.   All other systems reviewed and are negative.      Objective:   Physical Exam  Constitutional: She is oriented to person, place, and time. She appears well-developed and well-nourished. No distress.  Neck: Normal range of motion. Neck supple.  Cardiovascular: Normal rate.  Pulmonary/Chest: Effort normal.  Abdominal: Soft. Bowel sounds are normal.  Neurological: She is oriented to person, place, and time.  Skin: Skin is warm and dry.  Psychiatric: She has a normal mood and affect. Her behavior is normal. Judgment and thought content normal.   BP (!) 146/78   Pulse 76   Temp 98.5 F (36.9 C) (Oral)   Ht 5\' 4"  (1.626 m)   Wt 112 lb (50.8 kg)   SpO2 95%   BMI 19.22 kg/m   Chest xray- moderate bronchitic changes-Preliminary reading by Ronnald Collum, FNP  [facility]       Assessment & Plan:  Tiffany Maxwell in today with chief complaint of Hospitalization Follow-up (Seen at [facility] urgent  care)   1. Rib pain on left side No rib fractures - DG Chest 2 View; Future  2. COPD exacerbation ([place]) Finish antibiotic Continuous o2 via nasal cannulla  3. Hospital discharge follow-up Hospital records reviewed  [place], [place]

## 2018-06-26 ENCOUNTER — Emergency Department (HOSPITAL_COMMUNITY): Payer: Medicare Other

## 2018-06-26 ENCOUNTER — Encounter (HOSPITAL_COMMUNITY): Payer: Self-pay

## 2018-06-26 ENCOUNTER — Inpatient Hospital Stay (HOSPITAL_COMMUNITY)
Admission: EM | Admit: 2018-06-26 | Discharge: 2018-07-02 | DRG: 193 | Disposition: A | Discharge status: Skilled Nursing Facility | Payer: Medicare Other | Attending: Internal Medicine | Admitting: Internal Medicine

## 2018-06-26 ENCOUNTER — Other Ambulatory Visit: Payer: Self-pay

## 2018-06-26 DIAGNOSIS — I5042 Chronic combined systolic (congestive) and diastolic (congestive) heart failure: Secondary | ICD-10-CM | POA: Diagnosis present

## 2018-06-26 DIAGNOSIS — R4189 Other symptoms and signs involving cognitive functions and awareness: Secondary | ICD-10-CM | POA: Diagnosis present

## 2018-06-26 DIAGNOSIS — Z6821 Body mass index (BMI) 21.0-21.9, adult: Secondary | ICD-10-CM

## 2018-06-26 DIAGNOSIS — E785 Hyperlipidemia, unspecified: Secondary | ICD-10-CM | POA: Diagnosis present

## 2018-06-26 DIAGNOSIS — R069 Unspecified abnormalities of breathing: Secondary | ICD-10-CM | POA: Diagnosis not present

## 2018-06-26 DIAGNOSIS — R05 Cough: Secondary | ICD-10-CM | POA: Diagnosis not present

## 2018-06-26 DIAGNOSIS — J9621 Acute and chronic respiratory failure with hypoxia: Secondary | ICD-10-CM | POA: Diagnosis present

## 2018-06-26 DIAGNOSIS — J181 Lobar pneumonia, unspecified organism: Principal | ICD-10-CM

## 2018-06-26 DIAGNOSIS — J441 Chronic obstructive pulmonary disease with (acute) exacerbation: Secondary | ICD-10-CM | POA: Diagnosis not present

## 2018-06-26 DIAGNOSIS — J439 Emphysema, unspecified: Secondary | ICD-10-CM | POA: Diagnosis present

## 2018-06-26 DIAGNOSIS — Z79899 Other long term (current) drug therapy: Secondary | ICD-10-CM

## 2018-06-26 DIAGNOSIS — Z886 Allergy status to analgesic agent status: Secondary | ICD-10-CM | POA: Diagnosis not present

## 2018-06-26 DIAGNOSIS — R52 Pain, unspecified: Secondary | ICD-10-CM | POA: Diagnosis not present

## 2018-06-26 DIAGNOSIS — R41841 Cognitive communication deficit: Secondary | ICD-10-CM | POA: Diagnosis not present

## 2018-06-26 DIAGNOSIS — M810 Age-related osteoporosis without current pathological fracture: Secondary | ICD-10-CM | POA: Diagnosis present

## 2018-06-26 DIAGNOSIS — E43 Unspecified severe protein-calorie malnutrition: Secondary | ICD-10-CM | POA: Diagnosis present

## 2018-06-26 DIAGNOSIS — R1310 Dysphagia, unspecified: Secondary | ICD-10-CM | POA: Diagnosis not present

## 2018-06-26 DIAGNOSIS — R0602 Shortness of breath: Secondary | ICD-10-CM | POA: Diagnosis not present

## 2018-06-26 DIAGNOSIS — Z885 Allergy status to narcotic agent status: Secondary | ICD-10-CM

## 2018-06-26 DIAGNOSIS — F419 Anxiety disorder, unspecified: Secondary | ICD-10-CM | POA: Diagnosis not present

## 2018-06-26 DIAGNOSIS — F411 Generalized anxiety disorder: Secondary | ICD-10-CM | POA: Diagnosis present

## 2018-06-26 DIAGNOSIS — Z880 Allergy status to penicillin: Secondary | ICD-10-CM

## 2018-06-26 DIAGNOSIS — Z888 Allergy status to other drugs, medicaments and biological substances status: Secondary | ICD-10-CM | POA: Diagnosis not present

## 2018-06-26 DIAGNOSIS — J9811 Atelectasis: Secondary | ICD-10-CM | POA: Diagnosis present

## 2018-06-26 DIAGNOSIS — J189 Pneumonia, unspecified organism: Secondary | ICD-10-CM | POA: Diagnosis present

## 2018-06-26 DIAGNOSIS — E642 Sequelae of vitamin C deficiency: Secondary | ICD-10-CM | POA: Diagnosis not present

## 2018-06-26 DIAGNOSIS — I110 Hypertensive heart disease with heart failure: Secondary | ICD-10-CM | POA: Diagnosis present

## 2018-06-26 DIAGNOSIS — R109 Unspecified abdominal pain: Secondary | ICD-10-CM | POA: Diagnosis not present

## 2018-06-26 DIAGNOSIS — J9601 Acute respiratory failure with hypoxia: Secondary | ICD-10-CM | POA: Diagnosis not present

## 2018-06-26 DIAGNOSIS — I252 Old myocardial infarction: Secondary | ICD-10-CM

## 2018-06-26 DIAGNOSIS — Z7951 Long term (current) use of inhaled steroids: Secondary | ICD-10-CM

## 2018-06-26 DIAGNOSIS — I10 Essential (primary) hypertension: Secondary | ICD-10-CM | POA: Diagnosis not present

## 2018-06-26 DIAGNOSIS — Z87891 Personal history of nicotine dependence: Secondary | ICD-10-CM

## 2018-06-26 DIAGNOSIS — K210 Gastro-esophageal reflux disease with esophagitis: Secondary | ICD-10-CM | POA: Diagnosis not present

## 2018-06-26 DIAGNOSIS — I5020 Unspecified systolic (congestive) heart failure: Secondary | ICD-10-CM | POA: Diagnosis not present

## 2018-06-26 DIAGNOSIS — Z7401 Bed confinement status: Secondary | ICD-10-CM | POA: Diagnosis not present

## 2018-06-26 DIAGNOSIS — Z9981 Dependence on supplemental oxygen: Secondary | ICD-10-CM | POA: Diagnosis not present

## 2018-06-26 DIAGNOSIS — Z66 Do not resuscitate: Secondary | ICD-10-CM | POA: Diagnosis present

## 2018-06-26 DIAGNOSIS — M6281 Muscle weakness (generalized): Secondary | ICD-10-CM | POA: Diagnosis not present

## 2018-06-26 DIAGNOSIS — R279 Unspecified lack of coordination: Secondary | ICD-10-CM | POA: Diagnosis not present

## 2018-06-26 LAB — CBC WITH DIFFERENTIAL/PLATELET
Basophils Absolute: 0 10*3/uL (ref 0.0–0.1)
Basophils Relative: 0 %
Eosinophils Absolute: 0.5 10*3/uL (ref 0.0–0.7)
Eosinophils Relative: 7 %
HCT: 37.8 % (ref 36.0–46.0)
Hemoglobin: 11.5 g/dL — ABNORMAL LOW (ref 12.0–15.0)
LYMPHS ABS: 1.7 10*3/uL (ref 0.7–4.0)
LYMPHS PCT: 25 %
MCH: 28.5 pg (ref 26.0–34.0)
MCHC: 30.4 g/dL (ref 30.0–36.0)
MCV: 93.6 fL (ref 78.0–100.0)
Monocytes Absolute: 1 10*3/uL (ref 0.1–1.0)
Monocytes Relative: 14 %
NEUTROS ABS: 3.6 10*3/uL (ref 1.7–7.7)
Neutrophils Relative %: 54 %
Platelets: 193 10*3/uL (ref 150–400)
RBC: 4.04 MIL/uL (ref 3.87–5.11)
RDW: 12.5 % (ref 11.5–15.5)
WBC: 6.8 10*3/uL (ref 4.0–10.5)

## 2018-06-26 LAB — COMPREHENSIVE METABOLIC PANEL
ALT: 8 U/L (ref 0–44)
AST: 19 U/L (ref 15–41)
Albumin: 3.3 g/dL — ABNORMAL LOW (ref 3.5–5.0)
Alkaline Phosphatase: 79 U/L (ref 38–126)
Anion gap: 7 (ref 5–15)
BUN: 8 mg/dL (ref 8–23)
CO2: 37 mmol/L — AB (ref 22–32)
CREATININE: 0.38 mg/dL — AB (ref 0.44–1.00)
Calcium: 9.5 mg/dL (ref 8.9–10.3)
Chloride: 93 mmol/L — ABNORMAL LOW (ref 98–111)
GFR calc non Af Amer: >60 mL/min (ref >60)
Glucose, Bld: 106 mg/dL — ABNORMAL HIGH (ref 70–99)
Potassium: 3.5 mmol/L (ref 3.5–5.1)
Sodium: 137 mmol/L (ref 135–145)
Total Bilirubin: 0.4 mg/dL (ref 0.3–1.2)
Total Protein: 6.2 g/dL — ABNORMAL LOW (ref 6.5–8.1)

## 2018-06-26 LAB — I-STAT CG4 LACTIC ACID, ED: LACTIC ACID, VENOUS: 1.03 mmol/L (ref 0.5–1.9)

## 2018-06-26 LAB — TROPONIN I

## 2018-06-26 LAB — LACTIC ACID, PLASMA: Lactic Acid, Venous: 0.9 mmol/L (ref 0.5–1.9)

## 2018-06-26 MED ORDER — VITAMIN C 500 MG PO TABS
500.0000 mg | ORAL_TABLET | Freq: Two times a day (BID) | ORAL | Status: DC
  Administered 2018-06-26 – 2018-07-02 (×12): 500 mg via ORAL
  Filled 2018-06-26 (×12): qty 1

## 2018-06-26 MED ORDER — METHYLPREDNISOLONE SODIUM SUCC 125 MG IJ SOLR
80.0000 mg | Freq: Two times a day (BID) | INTRAMUSCULAR | Status: DC
  Administered 2018-06-26 – 2018-06-28 (×4): 80 mg via INTRAVENOUS
  Filled 2018-06-26 (×4): qty 2

## 2018-06-26 MED ORDER — GUAIFENESIN ER 600 MG PO TB12
600.0000 mg | ORAL_TABLET | Freq: Two times a day (BID) | ORAL | Status: DC
  Administered 2018-06-26 – 2018-07-02 (×12): 600 mg via ORAL
  Filled 2018-06-26 (×12): qty 1

## 2018-06-26 MED ORDER — IPRATROPIUM BROMIDE 0.02 % IN SOLN: 0.5000 mg | Freq: Four times a day (QID) | RESPIRATORY_TRACT | Status: DC

## 2018-06-26 MED ORDER — CITALOPRAM HYDROBROMIDE 20 MG PO TABS
20.0000 mg | ORAL_TABLET | Freq: Every day | ORAL | Status: DC
  Administered 2018-06-26 – 2018-07-02 (×7): 20 mg via ORAL
  Filled 2018-06-26 (×6): qty 1
  Filled 2018-06-26: qty 2

## 2018-06-26 MED ORDER — ALBUTEROL SULFATE (2.5 MG/3ML) 0.083% IN NEBU
2.5000 mg | INHALATION_SOLUTION | RESPIRATORY_TRACT | Status: DC | PRN
  Administered 2018-06-28 – 2018-07-02 (×4): 2.5 mg via RESPIRATORY_TRACT
  Filled 2018-06-26 (×4): qty 3

## 2018-06-26 MED ORDER — LEVOFLOXACIN IN D5W 750 MG/150ML IV SOLN
750.0000 mg | INTRAVENOUS | Status: DC
  Administered 2018-06-26 – 2018-06-30 (×3): 750 mg via INTRAVENOUS
  Filled 2018-06-26 (×4): qty 150

## 2018-06-26 MED ORDER — MIRTAZAPINE 15 MG PO TABS
15.0000 mg | ORAL_TABLET | Freq: Every day | ORAL | Status: DC
  Administered 2018-06-26 – 2018-07-01 (×6): 15 mg via ORAL
  Filled 2018-06-26 (×6): qty 1

## 2018-06-26 MED ORDER — SODIUM CHLORIDE 0.9 % IV SOLN: 250.0000 mL | INTRAVENOUS | Status: DC | PRN

## 2018-06-26 MED ORDER — PREDNISONE 20 MG PO TABS
40.0000 mg | ORAL_TABLET | Freq: Once | ORAL | Status: AC
  Administered 2018-06-26: 40 mg via ORAL
  Filled 2018-06-26: qty 2

## 2018-06-26 MED ORDER — VANCOMYCIN HCL IN DEXTROSE 1-5 GM/200ML-% IV SOLN: 1000.0000 mg | Freq: Once | INTRAVENOUS | Status: DC

## 2018-06-26 MED ORDER — ENSURE ENLIVE PO LIQD
237.0000 mL | Freq: Two times a day (BID) | ORAL | Status: DC
  Administered 2018-06-27 – 2018-07-02 (×7): 237 mL via ORAL

## 2018-06-26 MED ORDER — IPRATROPIUM-ALBUTEROL 0.5-2.5 (3) MG/3ML IN SOLN
3.0000 mL | Freq: Four times a day (QID) | RESPIRATORY_TRACT | Status: DC
Start: 2018-06-26 — End: 2018-07-01
  Administered 2018-06-27 – 2018-07-01 (×16): 3 mL via RESPIRATORY_TRACT
  Filled 2018-06-26 (×16): qty 3

## 2018-06-26 MED ORDER — SODIUM CHLORIDE 0.9% FLUSH: 3.0000 mL | INTRAVENOUS | Status: DC | PRN

## 2018-06-26 MED ORDER — PANTOPRAZOLE SODIUM 40 MG PO TBEC
40.0000 mg | DELAYED_RELEASE_TABLET | Freq: Every day | ORAL | Status: DC
  Administered 2018-06-26 – 2018-07-02 (×7): 40 mg via ORAL
  Filled 2018-06-26 (×7): qty 1

## 2018-06-26 MED ORDER — CALCIUM CARBONATE-VITAMIN D 500-200 MG-UNIT PO TABS
1.0000 | ORAL_TABLET | Freq: Two times a day (BID) | ORAL | Status: DC
  Administered 2018-06-26 – 2018-07-02 (×12): 1 via ORAL
  Filled 2018-06-26 (×22): qty 1

## 2018-06-26 MED ORDER — IPRATROPIUM-ALBUTEROL 0.5-2.5 (3) MG/3ML IN SOLN
3.0000 mL | Freq: Once | RESPIRATORY_TRACT | Status: AC
  Administered 2018-06-26: 3 mL via RESPIRATORY_TRACT
  Filled 2018-06-26: qty 6

## 2018-06-26 MED ORDER — SODIUM CHLORIDE 0.9 % IV SOLN
1.0000 g | Freq: Once | INTRAVENOUS | Status: AC
  Administered 2018-06-26: 1 g via INTRAVENOUS
  Filled 2018-06-26: qty 1

## 2018-06-26 MED ORDER — LISINOPRIL 10 MG PO TABS
20.0000 mg | ORAL_TABLET | Freq: Every day | ORAL | Status: DC
  Administered 2018-06-26 – 2018-07-02 (×7): 20 mg via ORAL
  Filled 2018-06-26 (×7): qty 2

## 2018-06-26 MED ORDER — IPRATROPIUM-ALBUTEROL 0.5-2.5 (3) MG/3ML IN SOLN
3.0000 mL | Freq: Once | RESPIRATORY_TRACT | Status: AC
  Administered 2018-06-26: 3 mL via RESPIRATORY_TRACT

## 2018-06-26 MED ORDER — ALBUTEROL SULFATE (2.5 MG/3ML) 0.083% IN NEBU: 2.5000 mg | INHALATION_SOLUTION | Freq: Four times a day (QID) | RESPIRATORY_TRACT | Status: DC

## 2018-06-26 MED ORDER — FERROUS SULFATE 325 (65 FE) MG PO TABS
325.0000 mg | ORAL_TABLET | Freq: Two times a day (BID) | ORAL | Status: DC
  Administered 2018-06-27 – 2018-07-02 (×11): 325 mg via ORAL
  Filled 2018-06-26 (×11): qty 1

## 2018-06-26 MED ORDER — SODIUM CHLORIDE 0.9% FLUSH
3.0000 mL | Freq: Two times a day (BID) | INTRAVENOUS | Status: DC
  Administered 2018-06-27 – 2018-07-02 (×8): 3 mL via INTRAVENOUS

## 2018-06-26 NOTE — ED Triage Notes (Signed)
Pt has copd, reports sob that started this afternoon and left sided flank pain.  Pt alert and oriented.  Denies any recent falls or injury.

## 2018-06-26 NOTE — ED Provider Notes (Signed)
[facility] EMERGENCY DEPARTMENT Provider Note   CSN: [id number] Arrival date & time: 06/26/18  1828     History   Chief Complaint Chief Complaint  Patient presents with  . Shortness of Breath    HPI Tiffany Maxwell is a 84 y.o. female.  HPI  The pt is an 84 y/o female, known history of congestive heart failure with an ejection fraction of 40% as of July 2018. Has a history of COPD with chronic tobacco use until she stopped approximately 6 years ago. She also has a history of myocardial infarction, hypertension and hyperlipidemia.  She presents with a chief complaint shortness of breath  The patient is somewhat difficult to evaluate historically because she cannot give very good and accurate information however she reports that she has been short of breath for "a long time", it is constant, nothing seems to make it better but it gets worse when she walks. She was evaluated by the paramedics at home today because of an feeling of increasing shortness of breath. She was given a treatment of her own ipratropium at home by the paramedics,the lung sounds reported prehospital were diminished as the patient continued to talk throughout their entire transport. She is on home oxygen at approximately 2 L chronically. She denies swelling, fevers, she does endorse having a cough. She does not recall the last time she had prednisone.  Review of the medical record shows that the patient has been evaluated at this hospital system multiple times in the past for things such as rib fractures, COPD exacerbations, heart failure and rectal bleeding.  Past Medical History:  Diagnosis Date  . CHF (congestive heart failure) ([place])   . Cholelithiasis 01/30/2013  . Compression fracture of lumbar spine, non-traumatic ([place]) 04/09/2014  . COPD (chronic obstructive pulmonary disease) ([place])   . Diastolic dysfunction 8/65/7846   Grade 1. Ejection fraction 65%.  . Hip fracture ([place]) 06/19/2017  . History of  tobacco use 01/30/2013  . Hyperlipidemia   . Hypertension   . MI (myocardial infarction) ([place])    pt unaware  . Multiple fractures of thoracic spine, closed ([place]) 01/30/2013  . Osteoporosis   . PAC (premature atrial contraction)   . Palpitations   . Pulmonary emphysema ([place]) 01/30/2013  . Seborrheic keratosis     Patient Active Problem List   Diagnosis Date Noted  . COPD exacerbation ([place]) 06/26/2018  . Acute on chronic respiratory failure with hypoxia ([place]) 06/26/2018  . Primary insomnia 04/22/2018  . Hyperlipidemia 07/10/2017  . Chronic combined systolic (EF 96%) and diastolic CHF, NYHA class 1 ([place]) 07/10/2017  . COPD (chronic obstructive pulmonary disease) ([place]) 07/10/2017  . Closed comminuted intertrochanteric fracture of proximal end of right femur ([place]) 06/21/2017  . GAD (generalized anxiety disorder) 11/06/2016  . Back pain 04/08/2014  . Pulmonary emphysema ([place]) 01/30/2013  . History of tobacco use 01/30/2013  . Diastolic dysfunction 29/52/8413  . Osteoporosis 01/29/2013  . Compression fracture of thoracic spine, non-traumatic ([place]) 01/28/2013  . HTN (hypertension) 01/28/2013    Past Surgical History:  Procedure Laterality Date  . COMPRESSION HIP SCREW Left 04/29/2013   Procedure: COMPRESSION HIP;  Surgeon: Sanjuana Kava, MD;  Location: [facility];  Service: Orthopedics;  Laterality: Left;  [place]  . ESOPHAGOGASTRODUODENOSCOPY (EGD) WITH PROPOFOL N/A 07/11/2017   Procedure: ESOPHAGOGASTRODUODENOSCOPY (EGD) WITH PROPOFOL;  Surgeon: Ronald Lobo, MD;  Location: [place];  Service: Endoscopy;  Laterality: N/A;  . FEMUR IM NAIL Right 06/20/2017   Procedure: INTRAMEDULLARY (IM)  NAIL FEMORAL;  Surgeon: Rod Can, MD;  Location: [place];  Service: Orthopedics;  Laterality: Right;  . TUBAL LIGATION       OB History   None      Home Medications    Prior to Admission medications   Medication Sig Start Date End Date Taking? Authorizing Provider  Calcium  Carbonate-Vitamin D (CALCIUM 600+D3) 600-400 MG-UNIT per tablet Take 1 tablet by mouth 2 (two) times daily. 04/09/14   Rexene Alberts, MD  citalopram (CELEXA) 20 MG tablet Take 1 tablet (20 mg total) by mouth daily. 04/22/18   Hassell Done Mary-Margaret, FNP  feeding supplement, ENSURE ENLIVE, (ENSURE ENLIVE) LIQD Take 237 mLs by mouth 2 (two) times daily between meals. 06/24/17   Mariel Aloe, MD  ferrous sulfate 325 (65 FE) MG tablet Take 1 tablet (325 mg total) by mouth 2 (two) times daily with a meal. 04/22/18   Hassell Done, Mary-Margaret, FNP  HYDROcodone-acetaminophen (NORCO/VICODIN) 5-325 MG tablet Take 1-2 tablets by mouth every 6 (six) hours as needed for moderate pain. Patient taking differently: Take 1-2 tablets by mouth every 6 (six) hours as needed for moderate pain or severe pain.  06/21/17   Swinteck, Aaron Edelman, MD  Ipratropium-Albuterol (COMBIVENT RESPIMAT) 20-100 MCG/ACT AERS respimat Inhale 1 puff into the lungs every 6 (six) hours as needed for wheezing. 04/22/18   Hassell Done, Mary-Margaret, FNP  lisinopril (PRINIVIL,ZESTRIL) 20 MG tablet Take 1 tablet (20 mg total) by mouth daily. 04/22/18   Hassell Done, Mary-Margaret, FNP  mirtazapine (REMERON) 15 MG tablet Take 1 tablet (15 mg total) by mouth at bedtime. 04/22/18   Hassell Done, Mary-Margaret, FNP  OXYGEN Inhale 4 L into the lungs See admin instructions. TO MAINTAIN SATS OF 93% OR GREATER    [provider]  pantoprazole (PROTONIX) 40 MG tablet Take 1 tablet (40 mg total) by mouth daily. 04/22/18   Hassell Done, Mary-Margaret, FNP  polyethylene glycol powder (GLYCOLAX/MIRALAX) powder 17 GRAMS (1 CAPFUL) ONCE A DAY AS DIRECTED Patient taking differently: Mix and drink once a day: 17 grams of powder into 6-8 ounces of juice or water 07/03/16   Hassell Done, Mary-Margaret, FNP  VENTOLIN HFA 108 (90 Base) MCG/ACT inhaler 2 PUFFS EVERY 6 HOURS AS NEEDED FOR WHEEZING OR SHORTNESS OF BREATH Patient taking differently: Inhale 2 puffs into the lungs every 6 hours as needed for  wheezing or shortness of breath 04/03/17   [name], FNP  vitamin C (ASCORBIC ACID) 500 MG tablet Take 500 mg by mouth 2 (two) times daily.    [provider]    Family History Family History  Problem Relation Age of Onset  . Hypertension Mother     Social History Social History   Tobacco Use  . Smoking status: Former Smoker    Packs/day: 1.00    Years: 40.00    Pack years: 40.00    Types: Cigarettes    Last attempt to quit: 07/10/2012    Years since quitting: 5.9  . Smokeless tobacco: Former Network engineer Use Topics  . Alcohol use: No  . Drug use: No     Allergies   Codeine; Benicar hct [olmesartan medoxomil-hctz]; Hctz [hydrochlorothiazide]; and Naproxen   Review of Systems Review of Systems  All other systems reviewed and are negative.    Physical Exam Updated Vital Signs BP (!) 149/77 (BP Location: Left Arm)   Pulse 92   Temp 98.5 F (36.9 C) (Oral)   Resp (!) 22   Ht 5' (1.524 m)   Wt 50.8 kg (  112 lb)   SpO2 92%   BMI 21.87 kg/m   Physical Exam  Constitutional:  The patient is tachypneic with some increased work of breathing  HENT:  Head: Normocephalic and atraumatic.  Slightly dry mucous membranes  Eyes:  Pupils are equal round reactive, conjunctive are clear  Neck:  No JVD or lymphadenopathy  Cardiovascular:  Regular rate, borderline tachycardia, no murmurs, normal pulses, no JVD  Pulmonary/Chest:  There is slight increased work of breathing with diffuse expiratory wheezing and prolonged expiratory phase, no rales, occasional coughing with deep breathing. Decreased sounds at the left base.  Abdominal:  Soft nontender abdomen  Musculoskeletal:  No peripheral edema, supple joints and soft compartments diffusely  Neurological:  The patient speaks throughout the entire encounter, when I ask her to be quiet breathing to get about 5 seconds of quiet so that I can listen to her lung sounds. She is able to follow all of my  commands, when I ask her to sit up in the bed she continued to spontaneously and with what appears to be normal strength.  Skin:  No rashes to the skin     ED Treatments / Results  Labs (all labs ordered are listed, but only abnormal results are displayed) Labs Reviewed  CBC WITH DIFFERENTIAL/PLATELET - Abnormal; Notable for the following components:      Result Value   Hemoglobin 11.5 (*)    All other components within normal limits  COMPREHENSIVE METABOLIC PANEL - Abnormal; Notable for the following components:   Chloride 93 (*)    CO2 37 (*)    Glucose, Bld 106 (*)    Creatinine, Ser 0.38 (*)    Total Protein 6.2 (*)    Albumin 3.3 (*)    All other components within normal limits  TROPONIN I  LACTIC ACID, PLASMA  LACTIC ACID, PLASMA  I-STAT CG4 LACTIC ACID, ED    EKG EKG Interpretation  Date/Time:  Wednesday June 26 2018 18:49:05 EDT Ventricular Rate:  85 PR Interval:    QRS Duration: 100 QT Interval:  401 QTC Calculation: 477 R Axis:   4 Text Interpretation:  Sinus rhythm Probable anteroseptal infarct, old since last tracing no significant change Confirmed by Noemi Chapel [id number]) on 06/26/2018 7:00:50 PM   Radiology Dg Chest 2 View  Result Date: 06/26/2018 CLINICAL DATA:  Cough, shortness of breath EXAM: CHEST - 2 VIEW COMPARISON:  05/30/2018 FINDINGS: Right lower lobe opacity, suspicious for pneumonia. Mild left basilar opacity, atelectasis versus pneumonia. Chronic interstitial markings. No frank interstitial edema. No definite pleural effusions. No pneumothorax. Heart is normal in size. Degenerative changes of the visualized thoracolumbar spine. Stable midthoracic compression fracture deformities. IMPRESSION: Right lower lobe opacity, suspicious for pneumonia. Mild left basilar opacity, atelectasis versus pneumonia. Electronically Signed   By: Julian Hy M.D.   On: 06/26/2018 19:44    Procedures Procedures (including critical care time)  Medications  Ordered in ED Medications  ceFEPIme (MAXIPIME) 1 g in sodium chloride 0.9 % 100 mL IVPB (has no administration in time range)  vancomycin (VANCOCIN) IVPB 1000 mg/200 mL premix (has no administration in time range)  ipratropium-albuterol (DUONEB) 0.5-2.5 (3) MG/3ML nebulizer solution 3 mL (3 mLs Nebulization Given 06/26/18 1859)  ipratropium-albuterol (DUONEB) 0.5-2.5 (3) MG/3ML nebulizer solution 3 mL (3 mLs Nebulization Given 06/26/18 1858)  predniSONE (DELTASONE) tablet 40 mg (40 mg Oral Given 06/26/18 1856)     Initial Impression / Assessment and Plan / ED Course  I have reviewed the triage vital  signs and the nursing notes.  Pertinent labs & imaging results that were available during my care of the patient were reviewed by me and considered in my medical decision making (see chart for details).    The pt has abnormal pulmonary exam with wheezing and dec sounds at the L base - r/o pneumonia - tx emphysema which certainly has a part in her disease process.  Check labs and EKG.  The patient's laboratory workup is rather unremarkable with a normal lactic acid, normal blood counts, metabolic panel which is also unremarkable however her chest x-ray shows bilateral infiltrates right greater than left at the base consistent with pneumonia. Given her recent admission in July we will treat her for healthcare associated pneumonia, given her fragile lung status with her history of COPD and ongoing wheezing she will need to be admittedfor further evaluation. Discussed with Dr. [name] who is in agreement  Final Clinical Impressions(s) / ED Diagnoses   Final diagnoses:  HCAP (healthcare-associated pneumonia)      Noemi Chapel, MD 06/26/18 2112

## 2018-06-26 NOTE — ED Notes (Signed)
Patient transported to X-ray 

## 2018-06-26 NOTE — Progress Notes (Addendum)
Pharmacy Note:  Initial antibiotic(s) regimen of Vancomcyin ordered by EDP to treat Pneumonia.  Estimated Creatinine Clearance: 38.9 mL/min (A) (by C-G formula based on SCr of 0.38 mg/dL (L)).   Allergies  Allergen Reactions  . Codeine Other (See Comments)    Headache   . Benicar Hct [Olmesartan Medoxomil-Hctz] Swelling and Rash  . Hctz [Hydrochlorothiazide] Swelling and Rash  . Naproxen Itching, Rash and Other (See Comments)    Redness/flushing of skin    Vitals:   06/26/18 1859 06/26/18 2026  BP:  (!) 149/77  Pulse:  92  Resp:  (!) 22  Temp:    SpO2: 95% 92%    Anti-infectives (From admission, onward)   Start     Dose/Rate Route Frequency Ordered Stop   06/26/18 2115  vancomycin (VANCOCIN) IVPB 1000 mg/200 mL premix     1,000 mg 200 mL/hr over 60 Minutes Intravenous  Once 06/26/18 2109     06/26/18 2100  ceFEPIme (MAXIPIME) 1 g in sodium chloride 0.9 % 100 mL IVPB     1 g 200 mL/hr over 30 Minutes Intravenous  Once 06/26/18 2050        Antimicrobials this admission:  Vanc 7/17 x 1 Cefepime 7/17 x 1 Levaquin 7/17 >>  Dose adjustments this admission:  n/a   Microbiology results:   BCx:   UCx:    Sputum:    MRSA PCR:    Plan: Initial dose(s) of Vancomycin X 1 ordered. F/U admission orders for further dosing if therapy continued.  Pricilla Larsson, [facility] 06/26/2018 9:10 PM   PM: Admitting MD changed to Levaquin for CAP. Levaquin 750mg  IV every 48 hours.  Pricilla Larsson, [facility] 06/26/2018 9:42 PM

## 2018-06-26 NOTE — H&P (Signed)
History and Physical    Tiffany Maxwell ZJI:[id number] DOB: May 09, 1934 DOA: 06/26/2018  PCP: [name], FNP  Patient coming from: Home  Chief Complaint: Shortness of breath and cough  HPI: Tiffany Maxwell is a 84 y.o. female with medical history significant of advanced COPD, chronic respiratory failure on 2 L of oxygen at home, chronic pain, hypertension, CHF comes in for several days of worsening wheezing shortness of breath and cough at home.  Her cough is productive and nonbloody.  She denies any lower extremity edema or swelling.  She denies any nausea vomiting or diarrhea.  She denies any recent antibiotic usage or recent hospitalizations.  Patient found to have pneumonia on chest x-ray referred for admission for pneumonia and in the setting of acute COPD exacerbation.  On arrival to ED she was slightly hypoxic on her 2 L of oxygen she was given several nebulizer treatments and her oxygen was increased to 3 L and she is doing well with that.  Review of Systems: As per HPI otherwise 10 point review of systems negative.   Past Medical History:  Diagnosis Date  . CHF (congestive heart failure) ([place])   . Cholelithiasis 01/30/2013  . Compression fracture of lumbar spine, non-traumatic ([place]) 04/09/2014  . COPD (chronic obstructive pulmonary disease) ([place])   . Diastolic dysfunction 1/75/1025   Grade 1. Ejection fraction 65%.  . Hip fracture ([place]) 06/19/2017  . History of tobacco use 01/30/2013  . Hyperlipidemia   . Hypertension   . MI (myocardial infarction) ([place])    pt unaware  . Multiple fractures of thoracic spine, closed ([place]) 01/30/2013  . Osteoporosis   . PAC (premature atrial contraction)   . Palpitations   . Pulmonary emphysema ([place]) 01/30/2013  . Seborrheic keratosis     Past Surgical History:  Procedure Laterality Date  . COMPRESSION HIP SCREW Left 04/29/2013   Procedure: COMPRESSION HIP;  Surgeon: Sanjuana Kava, MD;  Location: [facility];  Service: Orthopedics;   Laterality: Left;  [place]  . ESOPHAGOGASTRODUODENOSCOPY (EGD) WITH PROPOFOL N/A 07/11/2017   Procedure: ESOPHAGOGASTRODUODENOSCOPY (EGD) WITH PROPOFOL;  Surgeon: Ronald Lobo, MD;  Location: [place];  Service: Endoscopy;  Laterality: N/A;  . FEMUR IM NAIL Right 06/20/2017   Procedure: INTRAMEDULLARY (IM) NAIL FEMORAL;  Surgeon: Rod Can, MD;  Location: [place];  Service: Orthopedics;  Laterality: Right;  . TUBAL LIGATION       reports that she quit smoking about 5 years ago. Her smoking use included cigarettes. She has a 40.00 pack-year smoking history. She has quit using smokeless tobacco. She reports that she does not drink alcohol or use drugs.  Allergies  Allergen Reactions  . Codeine Other (See Comments)    Headache   . Benicar Hct [Olmesartan Medoxomil-Hctz] Swelling and Rash  . Hctz [Hydrochlorothiazide] Swelling and Rash  . Naproxen Itching, Rash and Other (See Comments)    Redness/flushing of skin    Family History  Problem Relation Age of Onset  . Hypertension Mother     Prior to Admission medications   Medication Sig Start Date End Date Taking? Authorizing Provider  Calcium Carbonate-Vitamin D (CALCIUM 600+D3) 600-400 MG-UNIT per tablet Take 1 tablet by mouth 2 (two) times daily. 04/09/14   Rexene Alberts, MD  citalopram (CELEXA) 20 MG tablet Take 1 tablet (20 mg total) by mouth daily. 04/22/18   Hassell Done Mary-Margaret, FNP  feeding supplement, ENSURE ENLIVE, (ENSURE ENLIVE) LIQD Take 237 mLs by mouth 2 (two) times daily between meals. 06/24/17  Mariel Aloe, MD  ferrous sulfate 325 (65 FE) MG tablet Take 1 tablet (325 mg total) by mouth 2 (two) times daily with a meal. 04/22/18   Hassell Done, Mary-Margaret, FNP  HYDROcodone-acetaminophen (NORCO/VICODIN) 5-325 MG tablet Take 1-2 tablets by mouth every 6 (six) hours as needed for moderate pain. Patient taking differently: Take 1-2 tablets by mouth every 6 (six) hours as needed for moderate pain or severe pain.   06/21/17   Swinteck, Aaron Edelman, MD  Ipratropium-Albuterol (COMBIVENT RESPIMAT) 20-100 MCG/ACT AERS respimat Inhale 1 puff into the lungs every 6 (six) hours as needed for wheezing. 04/22/18   Hassell Done, Mary-Margaret, FNP  lisinopril (PRINIVIL,ZESTRIL) 20 MG tablet Take 1 tablet (20 mg total) by mouth daily. 04/22/18   Hassell Done, Mary-Margaret, FNP  mirtazapine (REMERON) 15 MG tablet Take 1 tablet (15 mg total) by mouth at bedtime. 04/22/18   Hassell Done, Mary-Margaret, FNP  OXYGEN Inhale 4 L into the lungs See admin instructions. TO MAINTAIN SATS OF 93% OR GREATER    [provider]  pantoprazole (PROTONIX) 40 MG tablet Take 1 tablet (40 mg total) by mouth daily. 04/22/18   Hassell Done, Mary-Margaret, FNP  polyethylene glycol powder (GLYCOLAX/MIRALAX) powder 17 GRAMS (1 CAPFUL) ONCE A DAY AS DIRECTED Patient taking differently: Mix and drink once a day: 17 grams of powder into 6-8 ounces of juice or water 07/03/16   Hassell Done, Mary-Margaret, FNP  VENTOLIN HFA 108 (90 Base) MCG/ACT inhaler 2 PUFFS EVERY 6 HOURS AS NEEDED FOR WHEEZING OR SHORTNESS OF BREATH Patient taking differently: Inhale 2 puffs into the lungs every 6 hours as needed for wheezing or shortness of breath 04/03/17   [name], FNP  vitamin C (ASCORBIC ACID) 500 MG tablet Take 500 mg by mouth 2 (two) times daily.    [provider]    Physical Exam: Vitals:   06/26/18 1839 06/26/18 1841 06/26/18 1859 06/26/18 2026  BP: 139/78   (!) 149/77  Pulse:    92  Resp:    (!) 22  Temp: 98.5 F (36.9 C)     TempSrc: Oral     SpO2:   95% 92%  Weight:  50.8 kg (112 lb)    Height:  5' (1.524 m)        Constitutional: NAD, calm, comfortable Vitals:   06/26/18 1839 06/26/18 1841 06/26/18 1859 06/26/18 2026  BP: 139/78   (!) 149/77  Pulse:    92  Resp:    (!) 22  Temp: 98.5 F (36.9 C)     TempSrc: Oral     SpO2:   95% 92%  Weight:  50.8 kg (112 lb)    Height:  5' (1.524 m)     Eyes: PERRL, lids and conjunctivae normal ENMT:  Mucous membranes are moist. Posterior pharynx clear of any exudate or lesions.Normal dentition.  Neck: normal, supple, no masses, no thyromegaly Respiratory: Diminished but clear to auscultation bilaterally, no wheezing, no crackles. Normal respiratory effort. No accessory muscle use.  Cardiovascular: Regular rate and rhythm, no murmurs / rubs / gallops. No extremity edema. 2+ pedal pulses. No carotid bruits.  Abdomen: no tenderness, no masses palpated. No hepatosplenomegaly. Bowel sounds positive.  Musculoskeletal: no clubbing / cyanosis. No joint deformity upper and lower extremities. Good ROM, no contractures. Normal muscle tone.  Skin: no rashes, lesions, ulcers. No induration Neurologic: CN 2-12 grossly intact. Sensation intact, DTR normal. Strength 5/5 in all 4.  Psychiatric: Normal judgment and insight. Alert and oriented x 3. Normal mood.  Labs on Admission: I have personally reviewed following labs and imaging studies  CBC: Recent Labs  Lab 06/26/18 2011  WBC 6.8  NEUTROABS 3.6  HGB 11.5*  HCT 37.8  MCV 93.6  PLT 025   Basic Metabolic Panel: Recent Labs  Lab 06/26/18 2011  NA 137  K 3.5  CL 93*  CO2 37*  GLUCOSE 106*  BUN 8  CREATININE 0.38*  CALCIUM 9.5   GFR: Estimated Creatinine Clearance: 38.9 mL/min (A) (by C-G formula based on SCr of 0.38 mg/dL (L)). Liver Function Tests: Recent Labs  Lab 06/26/18 2011  AST 19  ALT 8  ALKPHOS 79  BILITOT 0.4  PROT 6.2*  ALBUMIN 3.3*   No results for input(s): LIPASE, AMYLASE in the last 168 hours. No results for input(s): AMMONIA in the last 168 hours. Coagulation Profile: No results for input(s): INR, PROTIME in the last 168 hours. Cardiac Enzymes: Recent Labs  Lab 06/26/18 2011  TROPONINI <0.03   BNP (last 3 results) No results for input(s): PROBNP in the last 8760 hours. HbA1C: No results for input(s): HGBA1C in the last 72 hours. CBG: No results for input(s): GLUCAP in the last 168 hours. Lipid  Profile: No results for input(s): CHOL, HDL, LDLCALC, TRIG, CHOLHDL, LDLDIRECT in the last 72 hours. Thyroid Function Tests: No results for input(s): TSH, T4TOTAL, FREET4, T3FREE, THYROIDAB in the last 72 hours. Anemia Panel: No results for input(s): VITAMINB12, FOLATE, FERRITIN, TIBC, IRON, RETICCTPCT in the last 72 hours. Urine analysis:    Component Value Date/Time   COLORURINE YELLOW 04/07/2014 2303   APPEARANCEUR CLEAR 04/07/2014 2303   LABSPEC 1.010 04/07/2014 2303   PHURINE 6.0 04/07/2014 2303   GLUCOSEU NEGATIVE 04/07/2014 2303   HGBUR NEGATIVE 04/07/2014 2303   BILIRUBINUR NEGATIVE 04/07/2014 2303   KETONESUR NEGATIVE 04/07/2014 2303   PROTEINUR NEGATIVE 04/07/2014 2303   UROBILINOGEN 0.2 04/07/2014 2303   NITRITE NEGATIVE 04/07/2014 2303   LEUKOCYTESUR NEGATIVE 04/07/2014 2303   Sepsis Labs: !!!!!!!!!!!!!!!!!!!!!!!!!!!!!!!!!!!!!!!!!!!! @LABRCNTIP (procalcitonin:4,lacticidven:4) )No results found for this or any previous visit (from the past 240 hour(s)).   Radiological Exams on Admission: Dg Chest 2 View  Result Date: 06/26/2018 CLINICAL DATA:  Cough, shortness of breath EXAM: CHEST - 2 VIEW COMPARISON:  05/30/2018 FINDINGS: Right lower lobe opacity, suspicious for pneumonia. Mild left basilar opacity, atelectasis versus pneumonia. Chronic interstitial markings. No frank interstitial edema. No definite pleural effusions. No pneumothorax. Heart is normal in size. Degenerative changes of the visualized thoracolumbar spine. Stable midthoracic compression fracture deformities. IMPRESSION: Right lower lobe opacity, suspicious for pneumonia. Mild left basilar opacity, atelectasis versus pneumonia. Electronically Signed   By: Julian Hy M.D.   On: 06/26/2018 19:44    Old chart reviewed  Case discussed with Dr. Sabra Heck in the ED  Chest x-ray reviewed right lower lobe pneumonia  Assessment/Plan 84 year old female with community-acquired pneumonia in the setting of acute  on chronic respiratory failure and COPD exacerbation Principal Problem:   COPD exacerbation (HCC)-treat pneumonia with Levaquin.  Placed on IV Solu-Medrol.  Give frequent bronchodilator treatments.  Her oxygen is bumped up to 3 L she is doing well with that.  Her baseline respiratory status requires 2 L of supplemental oxygen.  Wean oxygen as tolerates.  Patient in no respiratory distress at this time and tolerating speaking in full sentences without difficulty.  Active Problems:   PNA (pneumonia)-Levaquin as above.  Follow-up on culture data.    Acute on chronic respiratory failure with hypoxia (HCC)-noted.  As above.  GAD (generalized anxiety disorder)-stable continue Celexa    Chronic combined systolic (EF 45%) and diastolic CHF, NYHA class 1 (HCC)-stable and compensated at this time.  Clarify home medications.   Home med rec is pending pharmacy review   DVT prophylaxis: SCDs Code Status: Full Family Communication: None Disposition Plan: 1 to 3 days Consults called: None Admission status: Admission   Leanne Sisler A MD [facility]  If 7PM-7AM, please contact night-coverage [email or web address] Password [facility]  06/26/2018, 9:17 PM

## 2018-06-27 DIAGNOSIS — J181 Lobar pneumonia, unspecified organism: Secondary | ICD-10-CM

## 2018-06-27 LAB — BASIC METABOLIC PANEL
ANION GAP: 6 (ref 5–15)
BUN: 7 mg/dL — ABNORMAL LOW (ref 8–23)
CHLORIDE: 99 mmol/L (ref 98–111)
CO2: 37 mmol/L — AB (ref 22–32)
Calcium: 9.7 mg/dL (ref 8.9–10.3)
Creatinine, Ser: 0.5 mg/dL (ref 0.44–1.00)
GFR calc Af Amer: >60 mL/min (ref >60)
GFR calc non Af Amer: >60 mL/min (ref >60)
Glucose, Bld: 162 mg/dL — ABNORMAL HIGH (ref 70–99)
Potassium: 4.6 mmol/L (ref 3.5–5.1)
Sodium: 142 mmol/L (ref 135–145)

## 2018-06-27 LAB — CBC
HCT: 40.4 % (ref 36.0–46.0)
HEMOGLOBIN: 12.5 g/dL (ref 12.0–15.0)
MCH: 28.8 pg (ref 26.0–34.0)
MCHC: 30.9 g/dL (ref 30.0–36.0)
MCV: 93.1 fL (ref 78.0–100.0)
Platelets: 210 10*3/uL (ref 150–400)
RBC: 4.34 MIL/uL (ref 3.87–5.11)
RDW: 12.3 % (ref 11.5–15.5)
WBC: 3.8 10*3/uL — ABNORMAL LOW (ref 4.0–10.5)

## 2018-06-27 LAB — MRSA PCR SCREENING: MRSA by PCR: NEGATIVE

## 2018-06-27 MED ORDER — ALPRAZOLAM 0.25 MG PO TABS
0.2500 mg | ORAL_TABLET | Freq: Three times a day (TID) | ORAL | Status: DC | PRN
  Administered 2018-06-27 – 2018-07-02 (×12): 0.25 mg via ORAL
  Filled 2018-06-27 (×12): qty 1

## 2018-06-27 MED ORDER — ONDANSETRON HCL 4 MG/2ML IJ SOLN: 4.0000 mg | Freq: Four times a day (QID) | INTRAMUSCULAR | Status: DC | PRN

## 2018-06-27 MED ORDER — ACETAMINOPHEN 325 MG PO TABS: 650.0000 mg | ORAL_TABLET | Freq: Four times a day (QID) | ORAL | Status: DC | PRN

## 2018-06-27 NOTE — Evaluation (Signed)
Physical Therapy Evaluation Patient Details Name: Tiffany Maxwell MRN: [id number] DOB: Dec 10, 1934 Today's Date: 06/27/2018   History of Present Illness  Tiffany Maxwell is a 84 y.o. female with medical history significant of advanced COPD, chronic respiratory failure on 2 L of oxygen at home, chronic pain, hypertension, CHF comes in for several days of worsening wheezing shortness of breath and cough at home.  Her cough is productive and nonbloody.  She denies any lower extremity edema or swelling.  She denies any nausea vomiting or diarrhea.  She denies any recent antibiotic usage or recent hospitalizations.  Patient found to have pneumonia on chest x-ray referred for admission for pneumonia and in the setting of acute COPD exacerbation.  On arrival to ED she was slightly hypoxic on her 2 L of oxygen she was given several nebulizer treatments and her oxygen was increased to 3 L and she is doing well with that.  Clinical Impression  Manual mm testing shows functional strength.   Pt mod I with bed mobility.  Therapist unaware of pt normal 02 level (due to chronic COPD and pt being on constant O2 I am sure this in below normal rates).  O2 tested after transfer to chair.  PT appeared SOB and O2 was at 87 therefore pt was not ambulated at this time.  Pt balance and strength are functional pt limitation is respiratory in nature not physical.    Follow Up Recommendations Home health PT    Equipment Recommendations  None recommended by PT    Recommendations for Other Services       Precautions / Restrictions Precautions Precautions: Fall Restrictions Weight Bearing Restrictions: No      Mobility  Bed Mobility Overal bed mobility: Modified Independent                Transfers Overall transfer level: Modified independent Equipment used: Standard walker             General transfer comment: PT took 4 steps to chair with O2.  O2 tested and was at 87 therefore did not walk.   Balance and strength seemed functional for walking limitation is respiratory not physical   Ambulation/Gait Ambulation/Gait assistance: Modified independent (Device/Increase time) Gait Distance (Feet): 2 Feet Assistive device: Standard walker Gait Pattern/deviations: Step-through pattern                Pertinent Vitals/Pain Pain Assessment: No/denies pain    Home Living    PT lives with son and daughter in law.  She states that someone is with  Her nearly all the time.                     Prior Function   household ambulation only with walker and O2                 Extremity/Trunk Assessment        Lower Extremity Assessment Lower Extremity Assessment: Overall WFL for tasks assessed       Communication      Cognition Arousal/Alertness: Awake/alert Behavior During Therapy: WFL for tasks assessed/performed Overall Cognitive Status: Within Functional Limits for tasks assessed                                               Assessment/Plan    PT Assessment Patient needs continued PT services  PT Problem List Decreased activity tolerance       PT Treatment Interventions Functional mobility training;Gait training;Therapeutic activities;Therapeutic exercise    PT Goals (Current goals can be found in the Care Plan section)  Acute Rehab PT Goals Patient Stated Goal: to be able to walk further without being SOB  PT Goal Formulation: With patient Time For Goal Achievement: 07/01/18 Potential to Achieve Goals: Good    Frequency Min 3X/week   Barriers to discharge           AM-PAC PT "6 Clicks" Daily Activity  Outcome Measure                  End of Session Equipment Utilized During Treatment: Gait belt;Oxygen Activity Tolerance: Treatment limited secondary to medical complications (Comment) Patient left: in chair;with call bell/phone within reach Nurse Communication: Mobility status PT Visit Diagnosis: Difficulty  in walking, not elsewhere classified (R26.2)    Time:0900-9:30  PT Time Calculation (min) (ACUTE ONLY): 30 min   Charges:  eval  PT Evaluation $PT Eval Low Complexity: [place], PT CLT [phone] 06/27/2018, 9:52 AM

## 2018-06-27 NOTE — Progress Notes (Signed)
PROGRESS NOTE  Tiffany Maxwell ZOX:[id number] DOB: [birth year] DOA: 06/26/2018 PCP: [name], FNP  Brief History:  84 year old female with a history of chronic respiratory failure on 2 L, COPD, chronic pain, hypertension, systolic and diastolic CHF, hyperlipidemia, and cognitive impairment presenting with at least 1 week history of coughing and shortness of breath.  The patient is a poor historian secondary to her cognitive impairment.  According to the patient's son, the patient went to urgent care on 05/20/2018.  The patient was given azithromycin and prednisone at that time for what appears to be a COPD exacerbation.  There was some mild improvement, but in the past week she became more short of breath with increasing cough.  There is no fevers, chills, nausea, vomiting, diarrhea, abdominal pain.  At the time of admission.  Chest x-ray showed a right lower lobe opacity and left lower lobe atelectasis.  The patient was started on cefepime initially and intravenous steroids.  Assessment/Plan: Acute on chronic respiratory failure with hypoxia -Presently on 3 L nasal cannula -Secondary to COPD exacerbation and pneumonia -Wean oxygen back to baseline 2 L -Pulmonary hygiene  Lobar pneumonia -Urine Legionella antigen -Urine Streptococcus pneumoniae antigen -Continue levofloxacin  COPD exacerbation -Start Pulmicort -Continue duo nebs every 6 -Continue IV steroids  Chronic systolic and diastolic CHF -Appears clinically euvolemic -Daily weights -06/20/2017 echo EF 40%, grade 1 DD, trivial AI  Essential hypertension -Continue lisinopril  Severe malnutrition -Continue Ensure  Anxiety -continue celexa     Disposition Plan:   Home in 1-2 days  Family Communication:   Son updated on phone  Consultants:  none  Code Status:  FULL / DNR  DVT Prophylaxis:  [place] Lovenox   Procedures: As Listed in Progress Note Above  Antibiotics: Cefepime 7/17 levoflox  7/17>>>    Subjective: Patient is breathing better but still has some shortness of breath and dry cough.  She denies any nausea, vomiting, diarrhea, chest pain, headache, neck pain.  Objective: Vitals:   06/26/18 2228 06/26/18 2300 06/27/18 0123 06/27/18 0400  BP:  134/80  (!) 165/119  Pulse: 96 97  90  Resp:    (!) 24  Temp:  99.4 F (37.4 C)  98.1 F (36.7 C)  TempSrc:  Oral  Oral  SpO2: 91% 96% 94% (!) 80%  Weight:      Height:        Intake/Output Summary (Last 24 hours) at 06/27/2018 0709 Last data filed at 06/27/2018 0500 Gross per 24 hour  Intake 395 ml  Output 1000 ml  Net -605 ml   Weight change:  Exam:   General:  Pt is alert, follows commands appropriately, not in acute distress  HEENT: No icterus, No thrush, No neck mass, [place]/AT  Cardiovascular: RRR, S1/S2, no rubs, no gallops  Respiratory: Diminished breath sounds at the bases.  Bibasilar wheezing.  Bilateral rales, right greater than left.  Abdomen: Soft/+BS, non tender, non distended, no guarding  Extremities: No edema, No lymphangitis, No petechiae, No rashes, no synovitis   Data Reviewed: I have personally reviewed following labs and imaging studies Basic Metabolic Panel: Recent Labs  Lab 06/26/18 2011 06/27/18 0341  NA 137 142  K 3.5 4.6  CL 93* 99  CO2 37* 37*  GLUCOSE 106* 162*  BUN 8 7*  CREATININE 0.38* 0.50  CALCIUM 9.5 9.7   Liver Function Tests: Recent Labs  Lab 06/26/18 2011  AST 19  ALT 8  ALKPHOS  79  BILITOT 0.4  PROT 6.2*  ALBUMIN 3.3*   No results for input(s): LIPASE, AMYLASE in the last 168 hours. No results for input(s): AMMONIA in the last 168 hours. Coagulation Profile: No results for input(s): INR, PROTIME in the last 168 hours. CBC: Recent Labs  Lab 06/26/18 2011 06/27/18 0341  WBC 6.8 3.8*  NEUTROABS 3.6  --   HGB 11.5* 12.5  HCT 37.8 40.4  MCV 93.6 93.1  PLT 193 210   Cardiac Enzymes: Recent Labs  Lab 06/26/18 2011  TROPONINI <0.03    BNP: Invalid input(s): POCBNP CBG: No results for input(s): GLUCAP in the last 168 hours. HbA1C: No results for input(s): HGBA1C in the last 72 hours. Urine analysis:    Component Value Date/Time   COLORURINE YELLOW 04/07/2014 2303   APPEARANCEUR CLEAR 04/07/2014 2303   LABSPEC 1.010 04/07/2014 2303   PHURINE 6.0 04/07/2014 2303   GLUCOSEU NEGATIVE 04/07/2014 2303   HGBUR NEGATIVE 04/07/2014 2303   BILIRUBINUR NEGATIVE 04/07/2014 2303   KETONESUR NEGATIVE 04/07/2014 2303   PROTEINUR NEGATIVE 04/07/2014 2303   UROBILINOGEN 0.2 04/07/2014 2303   NITRITE NEGATIVE 04/07/2014 2303   LEUKOCYTESUR NEGATIVE 04/07/2014 2303   Sepsis Labs: @LABRCNTIP (procalcitonin:4,lacticidven:4) ) Recent Results (from the past 240 hour(s))  MRSA PCR Screening     Status: None   Collection Time: 06/26/18 11:02 PM  Result Value Ref Range Status   MRSA by PCR NEGATIVE NEGATIVE Final    Comment:        The GeneXpert MRSA Assay (FDA approved for NASAL specimens only), is one component of a comprehensive MRSA colonization surveillance program. It is not intended to diagnose MRSA infection nor to guide or monitor treatment for MRSA infections. Performed at [facility], [address], [place], [place] [address]      Scheduled Meds: . calcium-vitamin D  1 tablet Oral BID  . citalopram  20 mg Oral Daily  . feeding supplement (ENSURE ENLIVE)  237 mL Oral BID BM  . ferrous sulfate  325 mg Oral BID WC  . guaiFENesin  600 mg Oral BID  . ipratropium-albuterol  3 mL Nebulization Q6H  . lisinopril  20 mg Oral Daily  . methylPREDNISolone (SOLU-MEDROL) injection  80 mg Intravenous Q12H  . mirtazapine  15 mg Oral QHS  . pantoprazole  40 mg Oral Daily  . sodium chloride flush  3 mL Intravenous Q12H  . vitamin C  500 mg Oral BID   Continuous Infusions: . sodium chloride    . levofloxacin (LEVAQUIN) IV 750 mg (06/26/18 2203)    Procedures/Studies: Dg Chest 2 View  Result Date:  06/26/2018 CLINICAL DATA:  Cough, shortness of breath EXAM: CHEST - 2 VIEW COMPARISON:  05/30/2018 FINDINGS: Right lower lobe opacity, suspicious for pneumonia. Mild left basilar opacity, atelectasis versus pneumonia. Chronic interstitial markings. No frank interstitial edema. No definite pleural effusions. No pneumothorax. Heart is normal in size. Degenerative changes of the visualized thoracolumbar spine. Stable midthoracic compression fracture deformities. IMPRESSION: Right lower lobe opacity, suspicious for pneumonia. Mild left basilar opacity, atelectasis versus pneumonia. Electronically Signed   By: Julian Hy M.D.   On: 06/26/2018 19:44   Dg Chest 2 View  Result Date: 05/30/2018 CLINICAL DATA:  Left-sided rib pain. EXAM: CHEST - 2 VIEW COMPARISON:  Chest x-ray dated June 19, 2017. FINDINGS: Stable cardiomegaly. Normal pulmonary vascularity. Atherosclerotic calcification of the aortic arch. Tortuous thoracic aorta. The lungs remain hyperinflated with emphysematous changes and mild diffuse interstitial prominence. Scarring at the lung  bases. No focal consolidation, pleural effusion, or pneumothorax. No acute osseous abnormality. Multiple chronic moderate and severe thoracic compression fractures are unchanged. IMPRESSION: COPD.  No active cardiopulmonary disease. Electronically Signed   By: Titus Dubin M.D.   On: 05/30/2018 15:27    Orson Eva, DO  [facility] Pager [phone]  If 7PM-7AM, please contact night-coverage [email or web address] Password [email or web address] 06/27/2018, 7:09 AM   LOS: 1 day

## 2018-06-28 DIAGNOSIS — J9621 Acute and chronic respiratory failure with hypoxia: Secondary | ICD-10-CM

## 2018-06-28 DIAGNOSIS — J181 Lobar pneumonia, unspecified organism: Principal | ICD-10-CM

## 2018-06-28 DIAGNOSIS — I5042 Chronic combined systolic (congestive) and diastolic (congestive) heart failure: Secondary | ICD-10-CM

## 2018-06-28 DIAGNOSIS — J441 Chronic obstructive pulmonary disease with (acute) exacerbation: Secondary | ICD-10-CM

## 2018-06-28 MED ORDER — METHYLPREDNISOLONE SODIUM SUCC 125 MG IJ SOLR
60.0000 mg | Freq: Four times a day (QID) | INTRAMUSCULAR | Status: DC
  Administered 2018-06-28 – 2018-06-29 (×3): 60 mg via INTRAVENOUS
  Filled 2018-06-28 (×3): qty 2

## 2018-06-28 MED ORDER — BUDESONIDE 0.5 MG/2ML IN SUSP
0.5000 mg | Freq: Two times a day (BID) | RESPIRATORY_TRACT | Status: DC
  Administered 2018-06-28 – 2018-07-02 (×8): 0.5 mg via RESPIRATORY_TRACT
  Filled 2018-06-28 (×8): qty 2

## 2018-06-28 NOTE — Progress Notes (Signed)
Patient arrived from ICU in stable condition.  

## 2018-06-28 NOTE — Care Management Note (Signed)
Case Management Note  Patient Details  Name: Tiffany Maxwell MRN: [id number] Date of Birth: 08/01/1934  Subjective/Objective:    COPD exacerbation. From home with son and DIL. Has RW at home and continuous oxygen provided by [facility]. Recommended for [facility] PT. Patient agreeable. Would like CM to discuss with son. Called home number, no answer. Talked with other DIL, who states patient has had previously and should be fine.                 Action/Plan: Juliann Pulse of [facility] notified and will obtain orders when available. Potential DC tomorrow.   Expected Discharge Date:  06/29/18               Expected Discharge Plan:  [place]  In-House Referral:     Discharge planning Services  CM Consult  Post Acute Care Choice:  Home Health Choice offered to:  Patient  DME Arranged:    DME Agency:     HH Arranged:  PT [place]:  [place]  Status of Service:  Completed, signed off  If discussed at [place] of Stay Meetings, dates discussed:    Additional Comments:  Tiffany Maxwell, Tiffany Reading, RN 06/28/2018, 1:29 PM

## 2018-06-28 NOTE — Care Management Important Message (Signed)
Important Message  Patient Details  Name: Tiffany Maxwell MRN: [id number] Date of Birth: Aug 30, 1934   Medicare Important Message Given:  Yes    Jadarian Mckay, Chauncey Reading, RN 06/28/2018, 1:33 PM

## 2018-06-28 NOTE — Progress Notes (Signed)
PROGRESS NOTE  Tiffany Maxwell OAC:[id number] DOB: [birth year] DOA: 06/26/2018 PCP: [name], FNP  Brief History:  84 year old female with a history of chronic respiratory failure on 2 L, COPD, chronic pain, hypertension, systolic and diastolic CHF, hyperlipidemia, and cognitive impairment presenting with at least 1 week history of coughing and shortness of breath.  The patient is a poor historian secondary to her cognitive impairment.  According to the patient's son, the patient went to urgent care on 05/20/2018.  The patient was given azithromycin and prednisone at that time for what appears to be a COPD exacerbation.  There was some mild improvement, but in the past week she became more short of breath with increasing cough.  There is no fevers, chills, nausea, vomiting, diarrhea, abdominal pain.  At the time of admission.  Chest x-ray showed a right lower lobe opacity and left lower lobe atelectasis.  The patient was started on levofloxacin initially and intravenous steroids.  Assessment/Plan: Acute on chronic respiratory failure with hypoxia -Presently on 3 L nasal cannula -Secondary to COPD exacerbation and pneumonia -Wean oxygen back to baseline 2 L -Pulmonary hygiene  Lobar pneumonia -Urine Legionella antigen -Urine Streptococcus pneumoniae antigen -Continue levofloxacin  COPD exacerbation -continue Pulmicort -Continue duo nebs every 6 -Continue IV steroids--increased frequency to q 6 hours  Chronic systolic and diastolic CHF -Appears clinically euvolemic -Daily weights -06/20/2017 echo EF 40%, grade 1 DD, trivial AI  Essential hypertension -Continue lisinopril  Severe malnutrition -Continue Ensure  Anxiety -continue celexa   Disposition Plan:   Home in 1-2 days  Family Communication:   Son updated on phone 7/18  Consultants:  none  Code Status:  FULL   DVT Prophylaxis:  [place] Lovenox   Procedures: As Listed in Progress Note  Above  Antibiotics: Cefepime 7/17 levoflox 7/17>>>       Subjective: Patient states that her breathing is better but she is still short of breath with exertion.  She denies any chest pain, nausea, vomiting, diarrhea, abdominal pain.  She still has a nonproductive cough.  There is no headache or neck pain.  Objective: Vitals:   06/28/18 0500 06/28/18 0743 06/28/18 1329 06/28/18 1345  BP: (!) 174/109   126/71  Pulse: 90   96  Resp: 20   18  Temp: 97.9 F (36.6 C) 98.1 F (36.7 C)  98.5 F (36.9 C)  TempSrc: Oral Oral  Oral  SpO2: 99%  91% 93%  Weight: 55.9 kg (123 lb 3.8 oz)   52.9 kg (116 lb 10 oz)  Height:        Intake/Output Summary (Last 24 hours) at 06/28/2018 1817 Last data filed at 06/28/2018 1300 Gross per 24 hour  Intake -  Output 1700 ml  Net -1700 ml   Weight change: 5.097 kg (11 lb 3.8 oz) Exam:   General:  Pt is alert, follows commands appropriately, not in acute distress  HEENT: No icterus, No thrush, No neck mass, [place]/AT  Cardiovascular: RRR, S1/S2, no rubs, no gallops  Respiratory: Bibasilar rales.  Bilateral expiratory wheeze.  Good air movement.  Abdomen: Soft/+BS, non tender, non distended, no guarding  Extremities: No edema, No lymphangitis, No petechiae, No rashes, no synovitis   Data Reviewed: I have personally reviewed following labs and imaging studies Basic Metabolic Panel: Recent Labs  Lab 06/26/18 2011 06/27/18 0341  NA 137 142  K 3.5 4.6  CL 93* 99  CO2 37* 37*  GLUCOSE 106* 162*  BUN 8 7*  CREATININE 0.38* 0.50  CALCIUM 9.5 9.7   Liver Function Tests: Recent Labs  Lab 06/26/18 2011  AST 19  ALT 8  ALKPHOS 79  BILITOT 0.4  PROT 6.2*  ALBUMIN 3.3*   No results for input(s): LIPASE, AMYLASE in the last 168 hours. No results for input(s): AMMONIA in the last 168 hours. Coagulation Profile: No results for input(s): INR, PROTIME in the last 168 hours. CBC: Recent Labs  Lab 06/26/18 2011 06/27/18 0341  WBC  6.8 3.8*  NEUTROABS 3.6  --   HGB 11.5* 12.5  HCT 37.8 40.4  MCV 93.6 93.1  PLT 193 210   Cardiac Enzymes: Recent Labs  Lab 06/26/18 2011  TROPONINI <0.03   BNP: Invalid input(s): POCBNP CBG: No results for input(s): GLUCAP in the last 168 hours. HbA1C: No results for input(s): HGBA1C in the last 72 hours. Urine analysis:    Component Value Date/Time   COLORURINE YELLOW 04/07/2014 2303   APPEARANCEUR CLEAR 04/07/2014 2303   LABSPEC 1.010 04/07/2014 2303   PHURINE 6.0 04/07/2014 2303   GLUCOSEU NEGATIVE 04/07/2014 2303   HGBUR NEGATIVE 04/07/2014 2303   BILIRUBINUR NEGATIVE 04/07/2014 2303   KETONESUR NEGATIVE 04/07/2014 2303   PROTEINUR NEGATIVE 04/07/2014 2303   UROBILINOGEN 0.2 04/07/2014 2303   NITRITE NEGATIVE 04/07/2014 2303   LEUKOCYTESUR NEGATIVE 04/07/2014 2303   Sepsis Labs: @LABRCNTIP (procalcitonin:4,lacticidven:4) ) Recent Results (from the past 240 hour(s))  MRSA PCR Screening     Status: None   Collection Time: 06/26/18 11:02 PM  Result Value Ref Range Status   MRSA by PCR NEGATIVE NEGATIVE Final    Comment:        The GeneXpert MRSA Assay (FDA approved for NASAL specimens only), is one component of a comprehensive MRSA colonization surveillance program. It is not intended to diagnose MRSA infection nor to guide or monitor treatment for MRSA infections. Performed at [facility], [address], [place], [place] [address]      Scheduled Meds: . calcium-vitamin D  1 tablet Oral BID  . citalopram  20 mg Oral Daily  . feeding supplement (ENSURE ENLIVE)  237 mL Oral BID BM  . ferrous sulfate  325 mg Oral BID WC  . guaiFENesin  600 mg Oral BID  . ipratropium-albuterol  3 mL Nebulization Q6H  . lisinopril  20 mg Oral Daily  . methylPREDNISolone (SOLU-MEDROL) injection  80 mg Intravenous Q12H  . mirtazapine  15 mg Oral QHS  . pantoprazole  40 mg Oral Daily  . sodium chloride flush  3 mL Intravenous Q12H  . vitamin C  500 mg Oral BID    Continuous Infusions: . sodium chloride    . levofloxacin (LEVAQUIN) IV 750 mg (06/26/18 2203)    Procedures/Studies: Dg Chest 2 View  Result Date: 06/26/2018 CLINICAL DATA:  Cough, shortness of breath EXAM: CHEST - 2 VIEW COMPARISON:  05/30/2018 FINDINGS: Right lower lobe opacity, suspicious for pneumonia. Mild left basilar opacity, atelectasis versus pneumonia. Chronic interstitial markings. No frank interstitial edema. No definite pleural effusions. No pneumothorax. Heart is normal in size. Degenerative changes of the visualized thoracolumbar spine. Stable midthoracic compression fracture deformities. IMPRESSION: Right lower lobe opacity, suspicious for pneumonia. Mild left basilar opacity, atelectasis versus pneumonia. Electronically Signed   By: Julian Hy M.D.   On: 06/26/2018 19:44   Dg Chest 2 View  Result Date: 05/30/2018 CLINICAL DATA:  Left-sided rib pain. EXAM: CHEST - 2 VIEW COMPARISON:  Chest x-ray dated June 19, 2017. FINDINGS:  Stable cardiomegaly. Normal pulmonary vascularity. Atherosclerotic calcification of the aortic arch. Tortuous thoracic aorta. The lungs remain hyperinflated with emphysematous changes and mild diffuse interstitial prominence. Scarring at the lung bases. No focal consolidation, pleural effusion, or pneumothorax. No acute osseous abnormality. Multiple chronic moderate and severe thoracic compression fractures are unchanged. IMPRESSION: COPD.  No active cardiopulmonary disease. Electronically Signed   By: Titus Dubin M.D.   On: 05/30/2018 15:27    Orson Eva, DO  [facility] Pager [phone]  If 7PM-7AM, please contact night-coverage [email or web address] Password [email or web address] 06/28/2018, 6:17 PM   LOS: 2 days

## 2018-06-29 LAB — BASIC METABOLIC PANEL
ANION GAP: 8 (ref 5–15)
BUN: 12 mg/dL (ref 8–23)
CO2: 37 mmol/L — AB (ref 22–32)
CREATININE: 0.39 mg/dL — AB (ref 0.44–1.00)
Calcium: 9.9 mg/dL (ref 8.9–10.3)
Chloride: 95 mmol/L — ABNORMAL LOW (ref 98–111)
GFR calc Af Amer: >60 mL/min (ref >60)
GLUCOSE: 130 mg/dL — AB (ref 70–99)
Potassium: 4.3 mmol/L (ref 3.5–5.1)
Sodium: 140 mmol/L (ref 135–145)

## 2018-06-29 MED ORDER — METHYLPREDNISOLONE SODIUM SUCC 40 MG IJ SOLR
40.0000 mg | Freq: Four times a day (QID) | INTRAMUSCULAR | Status: AC
Start: 2018-06-29 — End: 2018-07-01
  Administered 2018-06-29 – 2018-07-01 (×11): 40 mg via INTRAVENOUS
  Filled 2018-06-29 (×11): qty 1

## 2018-06-29 MED ORDER — FUROSEMIDE 10 MG/ML IJ SOLN
40.0000 mg | Freq: Once | INTRAMUSCULAR | Status: AC
  Administered 2018-06-29: 40 mg via INTRAVENOUS
  Filled 2018-06-29: qty 4

## 2018-06-29 NOTE — Progress Notes (Signed)
[facility] PROGRESS NOTE  Tiffany Maxwell UMP:[id number] DOB: [birth year] DOA: 06/26/2018 PCP: [name], FNP  Brief summary   84 year old female with a history of chronic respiratory failure on 2 L, COPD, chronic pain, hypertension, systolic and diastolic CHF, hyperlipidemia, and cognitive impairment presenting with at least 1 week history of coughing and shortness of breath, admitted with pneumonia, copd exacerbation The patient is a poor historian secondary to her cognitive impairment. According to the patient's son, the patient went to urgent care on 05/20/2018. The patient was given azithromycin and prednisone at that time for what appears to be a COPD exacerbation. There was some mild improvement, but in the past week she became more short of breath with increasing cough. There is no fevers, chills, nausea, vomiting, diarrhea, abdominal pain. At the time of admission. Chest x-ray showed a right lower lobe opacity and left lower lobe atelectasis. The patient was started on levofloxacin initially and intravenous steroids.    Assessment/Plan:  Lobar pneumonia. Cont iv antibiotic treatment. Will transition to oral regimen in 24-48 hrs   COPD exacerbation. Due to pneumonia as well. Continue Pulmicort, continue duo nebs every 6, continue IV steroids, cont to treat pneumonia. Monitor   Acute on chronic respiratory failure with hypoxia. Presently on 3 L nasal cannula. Secondary to COPD exacerbation and pneumonia. Wean oxygen back to baseline 2 L as able  Chronic systolic and diastolic CHF. 4/00/8676 echo EF 40%, grade 1 DD, trivial AI. Mild congested on exam. Will try one dose of lasix. Monitor   Essential hypertension. Continue lisinopril  Severe malnutrition. Continue Ensure  Anxiety. continue celexa   Code Status: full Family Communication: d/w patient, RN (indicate person spoken with, relationship, and if by phone, the number) Disposition Plan: obtain pt.  dispo in 24-48 hrs    Consultants:  none  Procedures:  none  Antibiotics: Anti-infectives (From admission, onward)   Start     Dose/Rate Route Frequency Ordered Stop   06/26/18 2200  levofloxacin (LEVAQUIN) IVPB 750 mg     750 mg 100 mL/hr over 90 Minutes Intravenous Every 48 hours 06/26/18 2145     06/26/18 2115  vancomycin (VANCOCIN) IVPB 1000 mg/200 mL premix  Status:  Discontinued     1,000 mg 200 mL/hr over 60 Minutes Intravenous  Once 06/26/18 2109 06/26/18 2120   06/26/18 2100  ceFEPIme (MAXIPIME) 1 g in sodium chloride 0.9 % 100 mL IVPB     1 g 200 mL/hr over 30 Minutes Intravenous  Once 06/26/18 2050 06/26/18 2148        (indicate start date, and stop date if known)  HPI/Subjective: Confused. No distress. Mild dyspnea. + cough   Objective: Vitals:   06/29/18 0816 06/29/18 0820  BP:    Pulse:    Resp:    Temp:    SpO2: 94% 94%    Intake/Output Summary (Last 24 hours) at 06/29/2018 0943 Last data filed at 06/29/2018 0600 Gross per 24 hour  Intake 863 ml  Output 2000 ml  Net -1137 ml   Filed Weights   06/26/18 1841 06/28/18 0500 06/28/18 1345  Weight: 50.8 kg (112 lb) 55.9 kg (123 lb 3.8 oz) 52.9 kg (116 lb 10 oz)    Exam:   General:  No distress   Cardiovascular: s1,s2 rrr  Respiratory: poor ventilation, few crackles   Abdomen: non tender, soft   Musculoskeletal: no leg edema    Data Reviewed: Basic Metabolic Panel: Recent Labs  Lab 06/26/18 2011 06/27/18 0341 06/29/18  0704  NA 137 142 140  K 3.5 4.6 4.3  CL 93* 99 95*  CO2 37* 37* 37*  GLUCOSE 106* 162* 130*  BUN 8 7* 12  CREATININE 0.38* 0.50 0.39*  CALCIUM 9.5 9.7 9.9   Liver Function Tests: Recent Labs  Lab 06/26/18 2011  AST 19  ALT 8  ALKPHOS 79  BILITOT 0.4  PROT 6.2*  ALBUMIN 3.3*   No results for input(s): LIPASE, AMYLASE in the last 168 hours. No results for input(s): AMMONIA in the last 168 hours. CBC: Recent Labs  Lab 06/26/18 2011 06/27/18 0341  WBC  6.8 3.8*  NEUTROABS 3.6  --   HGB 11.5* 12.5  HCT 37.8 40.4  MCV 93.6 93.1  PLT 193 210   Cardiac Enzymes: Recent Labs  Lab 06/26/18 2011  TROPONINI <0.03   BNP (last 3 results) No results for input(s): BNP in the last 8760 hours.  ProBNP (last 3 results) No results for input(s): PROBNP in the last 8760 hours.  CBG: No results for input(s): GLUCAP in the last 168 hours.  Recent Results (from the past 240 hour(s))  MRSA PCR Screening     Status: None   Collection Time: 06/26/18 11:02 PM  Result Value Ref Range Status   MRSA by PCR NEGATIVE NEGATIVE Final    Comment:        The GeneXpert MRSA Assay (FDA approved for NASAL specimens only), is one component of a comprehensive MRSA colonization surveillance program. It is not intended to diagnose MRSA infection nor to guide or monitor treatment for MRSA infections. Performed at [facility], [address], [place], [place] [address]      Studies: No results found.  Scheduled Meds: . budesonide (PULMICORT) nebulizer solution  0.5 mg Nebulization BID  . calcium-vitamin D  1 tablet Oral BID  . citalopram  20 mg Oral Daily  . feeding supplement (ENSURE ENLIVE)  237 mL Oral BID BM  . ferrous sulfate  325 mg Oral BID WC  . guaiFENesin  600 mg Oral BID  . ipratropium-albuterol  3 mL Nebulization Q6H  . lisinopril  20 mg Oral Daily  . methylPREDNISolone (SOLU-MEDROL) injection  60 mg Intravenous Q6H  . mirtazapine  15 mg Oral QHS  . pantoprazole  40 mg Oral Daily  . sodium chloride flush  3 mL Intravenous Q12H  . vitamin C  500 mg Oral BID   Continuous Infusions: . sodium chloride    . levofloxacin [facility]) IV Stopped (06/28/18 2338)    Principal Problem:   COPD exacerbation ([place]) Active Problems:   PNA (pneumonia)   GAD (generalized anxiety disorder)   Chronic combined systolic (EF 56%) and diastolic CHF, NYHA class 1 (HCC)   Acute on chronic respiratory failure with hypoxia (HCC)   Lobar pneumonia  ([place])    Time spent: >35 minutes     Kinnie Feil  [facility] Pager [phone]. If 7PM-7AM, please contact night-coverage at [email or web address], password [facility] 06/29/2018, 9:43 AM  LOS: 3 days

## 2018-06-30 MED ORDER — ALPRAZOLAM 0.5 MG PO TABS
0.5000 mg | ORAL_TABLET | Freq: Once | ORAL | Status: AC
  Administered 2018-06-30: 0.5 mg via ORAL
  Filled 2018-06-30: qty 1

## 2018-06-30 MED ORDER — FUROSEMIDE 10 MG/ML IJ SOLN
40.0000 mg | Freq: Once | INTRAMUSCULAR | Status: AC
  Administered 2018-06-30: 40 mg via INTRAVENOUS
  Filled 2018-06-30: qty 4

## 2018-06-30 NOTE — Progress Notes (Signed)
[facility] PROGRESS NOTE  Tiffany Maxwell FHL:[id number] DOB: [birth year] DOA: 06/26/2018 PCP: [name], FNP  Brief summary   84 year old female with a history of chronic respiratory failure on 2 L, COPD, chronic pain, hypertension, systolic and diastolic CHF, hyperlipidemia, and cognitive impairment presenting with at least 1 week history of coughing and shortness of breath, admitted with pneumonia, copd exacerbation The patient is a poor historian secondary to her cognitive impairment. According to the patient's son, the patient went to urgent care on 05/20/2018. The patient was given azithromycin and prednisone at that time for what appears to be a COPD exacerbation. There was some mild improvement, but in the past week she became more short of breath with increasing cough. There is no fevers, chills, nausea, vomiting, diarrhea, abdominal pain. At the time of admission. Chest x-ray showed a right lower lobe opacity and left lower lobe atelectasis. The patient was started on levofloxacin initially and intravenous steroids.    Assessment/Plan:  Lobar pneumonia. Cont iv antibiotic treatment. Will transition to oral regimen in 24-48 hrs   COPD exacerbation. Due to pneumonia as well. Continue Pulmicort, continue duo nebs every 6, continue IV steroids, cont to treat pneumonia. Monitor   Acute on chronic respiratory failure with hypoxia. Presently on 3 L nasal cannula. Secondary to COPD exacerbation and pneumonia. Wean oxygen back to baseline 2 L as able  Chronic systolic and diastolic CHF. 3/73/4287 echo EF 40%, grade 1 DD, trivial AI. Mild congested on exam. Will repeat dose of lasix today. Monitor   Essential hypertension. Continue lisinopril  Severe malnutrition. Continue Ensure  Anxiety. continue celexa   Code Status: full Family Communication: d/w patient, RN (indicate person spoken with, relationship, and if by phone, the number) Disposition Plan: home  with PT,  24-48 hrs if stable    Consultants:  none  Procedures:  none  Antibiotics: Anti-infectives (From admission, onward)   Start     Dose/Rate Route Frequency Ordered Stop   06/26/18 2200  levofloxacin (LEVAQUIN) IVPB 750 mg     750 mg 100 mL/hr over 90 Minutes Intravenous Every 48 hours 06/26/18 2145     06/26/18 2115  vancomycin (VANCOCIN) IVPB 1000 mg/200 mL premix  Status:  Discontinued     1,000 mg 200 mL/hr over 60 Minutes Intravenous  Once 06/26/18 2109 06/26/18 2120   06/26/18 2100  ceFEPIme (MAXIPIME) 1 g in sodium chloride 0.9 % 100 mL IVPB     1 g 200 mL/hr over 30 Minutes Intravenous  Once 06/26/18 2050 06/26/18 2148       (indicate start date, and stop date if known)  HPI/Subjective: Confused. No distress. Mild dyspnea. + cough. Still mild congested. And poor ventilation    Objective: Vitals:   06/30/18 0609 06/30/18 0625  BP: (!) 171/87   Pulse: 89   Resp: (!) 21   Temp: 98.3 F (36.8 C)   SpO2: 99% 91%    Intake/Output Summary (Last 24 hours) at 06/30/2018 0955 Last data filed at 06/30/2018 0245 Gross per 24 hour  Intake 600 ml  Output 950 ml  Net -350 ml   Filed Weights   06/26/18 1841 06/28/18 0500 06/28/18 1345  Weight: 50.8 kg (112 lb) 55.9 kg (123 lb 3.8 oz) 52.9 kg (116 lb 10 oz)    Exam:   General:  No distress   Cardiovascular: s1,s2 rrr  Respiratory: poor ventilation, few crackles   Abdomen: non tender, soft   Musculoskeletal: no leg edema    Data  Reviewed: Basic Metabolic Panel: Recent Labs  Lab 06/26/18 2011 06/27/18 0341 06/29/18 0704  NA 137 142 140  K 3.5 4.6 4.3  CL 93* 99 95*  CO2 37* 37* 37*  GLUCOSE 106* 162* 130*  BUN 8 7* 12  CREATININE 0.38* 0.50 0.39*  CALCIUM 9.5 9.7 9.9   Liver Function Tests: Recent Labs  Lab 06/26/18 2011  AST 19  ALT 8  ALKPHOS 79  BILITOT 0.4  PROT 6.2*  ALBUMIN 3.3*   No results for input(s): LIPASE, AMYLASE in the last 168 hours. No results for input(s):  AMMONIA in the last 168 hours. CBC: Recent Labs  Lab 06/26/18 2011 06/27/18 0341  WBC 6.8 3.8*  NEUTROABS 3.6  --   HGB 11.5* 12.5  HCT 37.8 40.4  MCV 93.6 93.1  PLT 193 210   Cardiac Enzymes: Recent Labs  Lab 06/26/18 2011  TROPONINI <0.03   BNP (last 3 results) No results for input(s): BNP in the last 8760 hours.  ProBNP (last 3 results) No results for input(s): PROBNP in the last 8760 hours.  CBG: No results for input(s): GLUCAP in the last 168 hours.  Recent Results (from the past 240 hour(s))  MRSA PCR Screening     Status: None   Collection Time: 06/26/18 11:02 PM  Result Value Ref Range Status   MRSA by PCR NEGATIVE NEGATIVE Final    Comment:        The GeneXpert MRSA Assay (FDA approved for NASAL specimens only), is one component of a comprehensive MRSA colonization surveillance program. It is not intended to diagnose MRSA infection nor to guide or monitor treatment for MRSA infections. Performed at [facility], [address], [place], [place] [address]      Studies: No results found.  Scheduled Meds: . budesonide (PULMICORT) nebulizer solution  0.5 mg Nebulization BID  . calcium-vitamin D  1 tablet Oral BID  . citalopram  20 mg Oral Daily  . feeding supplement (ENSURE ENLIVE)  237 mL Oral BID BM  . ferrous sulfate  325 mg Oral BID WC  . guaiFENesin  600 mg Oral BID  . ipratropium-albuterol  3 mL Nebulization Q6H  . lisinopril  20 mg Oral Daily  . methylPREDNISolone (SOLU-MEDROL) injection  40 mg Intravenous Q6H  . mirtazapine  15 mg Oral QHS  . pantoprazole  40 mg Oral Daily  . sodium chloride flush  3 mL Intravenous Q12H  . vitamin C  500 mg Oral BID   Continuous Infusions: . sodium chloride    . levofloxacin [facility]) IV Stopped (06/28/18 2338)    Principal Problem:   COPD exacerbation ([place]) Active Problems:   PNA (pneumonia)   GAD (generalized anxiety disorder)   Chronic combined systolic (EF 09%) and diastolic CHF, NYHA class 1  (HCC)   Acute on chronic respiratory failure with hypoxia (HCC)   Lobar pneumonia ([place])    Time spent: >35 minutes     Kinnie Feil  [facility] Pager [phone]. If 7PM-7AM, please contact night-coverage at [email or web address], password [facility] 06/30/2018, 9:55 AM  LOS: 4 days

## 2018-07-01 DIAGNOSIS — J189 Pneumonia, unspecified organism: Secondary | ICD-10-CM

## 2018-07-01 LAB — BASIC METABOLIC PANEL
ANION GAP: 6 (ref 5–15)
BUN: 19 mg/dL (ref 8–23)
CHLORIDE: 91 mmol/L — AB (ref 98–111)
CO2: 43 mmol/L — ABNORMAL HIGH (ref 22–32)
Calcium: 9.7 mg/dL (ref 8.9–10.3)
Creatinine, Ser: 0.5 mg/dL (ref 0.44–1.00)
GFR calc Af Amer: >60 mL/min (ref >60)
GFR calc non Af Amer: >60 mL/min (ref >60)
Glucose, Bld: 116 mg/dL — ABNORMAL HIGH (ref 70–99)
POTASSIUM: 4.4 mmol/L (ref 3.5–5.1)
SODIUM: 140 mmol/L (ref 135–145)

## 2018-07-01 MED ORDER — IPRATROPIUM-ALBUTEROL 0.5-2.5 (3) MG/3ML IN SOLN
3.0000 mL | Freq: Three times a day (TID) | RESPIRATORY_TRACT | Status: DC
  Administered 2018-07-02: 3 mL via RESPIRATORY_TRACT
  Filled 2018-07-01: qty 3

## 2018-07-01 MED ORDER — LEVOFLOXACIN 750 MG PO TABS
750.0000 mg | ORAL_TABLET | ORAL | Status: DC
  Administered 2018-07-02: 750 mg via ORAL
  Filled 2018-07-01: qty 1

## 2018-07-01 MED ORDER — PREDNISONE 20 MG PO TABS
60.0000 mg | ORAL_TABLET | Freq: Every day | ORAL | Status: DC
  Administered 2018-07-02: 60 mg via ORAL
  Filled 2018-07-01: qty 3

## 2018-07-01 NOTE — NC FL2 (Signed)
[place] MEDICAID FL2 LEVEL OF CARE SCREENING TOOL     IDENTIFICATION  Patient Name: [name] Birthdate: 1934-07-24 Sex: female Admission Date (Current Location): 06/26/2018  [facility] and [place] Number:  [employer] and Address:  [facility] [address] [place]      Provider Number: [id number]  Attending Physician Name and Address:  Tiffany Eva, MD  Relative Name and Phone Number:       Current Level of Care: Hospital Recommended Level of Care: [place] Prior Approval Number:    Date Approved/Denied:   PASRR Number: [id number] )  Discharge Plan: SNF    Current Diagnoses: Patient Active Problem List   Diagnosis Date Noted  . Lobar pneumonia ([place]) 06/27/2018  . COPD exacerbation ([place]) 06/26/2018  . Acute on chronic respiratory failure with hypoxia ([place]) 06/26/2018  . Primary insomnia 04/22/2018  . Hyperlipidemia 07/10/2017  . Chronic combined systolic (EF 95%) and diastolic CHF, NYHA class 1 ([place]) 07/10/2017  . COPD (chronic obstructive pulmonary disease) ([place]) 07/10/2017  . Closed comminuted intertrochanteric fracture of proximal end of right femur ([place]) 06/21/2017  . GAD (generalized anxiety disorder) 11/06/2016  . Back pain 04/08/2014  . Pulmonary emphysema ([place]) 01/30/2013  . History of tobacco use 01/30/2013  . Diastolic dysfunction 18/84/1660  . PNA (pneumonia) 01/29/2013  . Osteoporosis 01/29/2013  . Compression fracture of thoracic spine, non-traumatic ([place]) 01/28/2013  . HTN (hypertension) 01/28/2013    Orientation RESPIRATION BLADDER Height & Weight     Self, Situation, Place  O2(4L) Incontinent Weight: 116 lb 10 oz (52.9 kg) Height:  5' (152.4 cm)  BEHAVIORAL SYMPTOMS/MOOD NEUROLOGICAL BOWEL NUTRITION STATUS      Continent (Heart Healthy)  AMBULATORY STATUS COMMUNICATION OF NEEDS Skin   Limited Assist Verbally Normal                       Personal Care Assistance  Level of Assistance  Bathing, Feeding, Dressing Bathing Assistance: Limited assistance Feeding assistance: Independent Dressing Assistance: Limited assistance     Functional Limitations Info  Sight, Hearing, Speech Sight Info: Impaired Hearing Info: Impaired Speech Info: Adequate    SPECIAL CARE FACTORS FREQUENCY  PT (By licensed PT)     PT Frequency: 5x/week              Contractures Contractures Info: Not present    Additional Factors Info  Code Status, Allergies, Psychotropic Code Status Info: Full Code Allergies Info: Codeine, Penicillins, Benicar Hct, Hctz, Naproxen Psychotropic Info: Remeron, Celexa         Current Medications (07/01/2018):  This is the current hospital active medication list Current Facility-Administered Medications  Medication Dose Route Frequency Provider Last Rate Last Dose  . 0.9 %  sodium chloride infusion  250 mL Intravenous PRN Tiffany Grout, MD      . acetaminophen (TYLENOL) tablet 650 mg  650 mg Oral Q6H PRN Tat, David, MD      . albuterol (PROVENTIL) (2.5 MG/3ML) 0.083% nebulizer solution 2.5 mg  2.5 mg Nebulization Q2H PRN Tiffany Grout, MD   2.5 mg at 06/29/18 1843  . ALPRAZolam Duanne Moron) tablet 0.25 mg  0.25 mg Oral TID PRN Tiffany Eva, MD   0.25 mg at 07/01/18 0950  . budesonide (PULMICORT) nebulizer solution 0.5 mg  0.5 mg Nebulization BID Tat, David, MD   0.5 mg at 07/01/18 0743  . calcium-vitamin D (OSCAL WITH D) 500-200 MG-UNIT per tablet 1 tablet  1 tablet Oral BID Tiffany Grout, MD   1 tablet at 07/01/18 0825  . citalopram (CELEXA) tablet 20 mg  20 mg Oral Daily Tiffany Kay A, MD   20 mg at 07/01/18 0825  . feeding supplement (ENSURE ENLIVE) (ENSURE ENLIVE) liquid 237 mL  237 mL Oral BID BM Tiffany Kay A, MD   237 mL at 07/01/18 1400  . ferrous sulfate tablet 325 mg  325 mg Oral BID WC Tiffany Grout, MD   325 mg at 07/01/18 0826  . guaiFENesin (MUCINEX) 12 hr tablet 600 mg  600 mg Oral BID Tiffany Kay A, MD   600 mg  at 07/01/18 0826  . ipratropium-albuterol (DUONEB) 0.5-2.5 (3) MG/3ML nebulizer solution 3 mL  3 mL Nebulization Q6H Tiffany Kay A, MD   3 mL at 07/01/18 1245  . levofloxacin (LEVAQUIN) IVPB 750 mg  750 mg Intravenous Q48H Tiffany Grout, MD   Stopped at 07/01/18 0259  . lisinopril (PRINIVIL,ZESTRIL) tablet 20 mg  20 mg Oral Daily Tiffany Kay A, MD   20 mg at 07/01/18 0826  . methylPREDNISolone sodium succinate (SOLU-MEDROL) 40 mg/mL injection 40 mg  40 mg Intravenous Q6H Buriev, Arie Sabina, MD   40 mg at 07/01/18 1129  . mirtazapine (REMERON) tablet 15 mg  15 mg Oral QHS Tiffany Kay A, MD   15 mg at 06/30/18 2153  . ondansetron (ZOFRAN) injection 4 mg  4 mg Intravenous Q6H PRN Tat, David, MD      . pantoprazole (PROTONIX) EC tablet 40 mg  40 mg Oral Daily Tiffany Kay A, MD   40 mg at 07/01/18 0826  . sodium chloride flush (NS) 0.9 % injection 3 mL  3 mL Intravenous Q12H Tiffany Kay A, MD   3 mL at 07/01/18 0826  . sodium chloride flush (NS) 0.9 % injection 3 mL  3 mL Intravenous PRN Tiffany Kay A, MD      . vitamin C (ASCORBIC ACID) tablet 500 mg  500 mg Oral BID Tiffany Grout, MD   500 mg at 07/01/18 5465     Discharge Medications: Please see discharge summary for Maxwell list of discharge medications.  Relevant Imaging Results:  Relevant Lab Results:   Additional Information SSN [id number] Clydene Pugh, LCSW

## 2018-07-01 NOTE — Clinical Social Work Note (Signed)
Clinical Social Work Assessment  Patient Details  Name: Tiffany Maxwell MRN: [id number] Date of Birth: 11-02-1934  Date of referral:  07/01/18               Reason for consult:  Facility Placement                Permission sought to share information with:    Permission granted to share information::     Name::        Agency::     Relationship::     Contact Information:  daughter, [name] and sister, Enid Derry. Son, Louie Casa via phone.  Housing/Transportation Living arrangements for the past 2 months:  [place] of Information:  Patient, Siblings, Adult Children Patient Interpreter Needed:  None Criminal Activity/Legal Involvement Pertinent to Current Situation/Hospitalization:  No - Comment as needed Significant Relationships:  Adult Children, Siblings Lives with:  Adult Children Do you feel safe going back to the place where you live?  Yes Need for family participation in patient care:  Yes (Comment)  Care giving concerns:  Patient had an aid up until about two weeks ago when she repots that the aid stopped coming because social services stopped paying for it.    Social Worker assessment / plan:  At baseline, Patient ambulates with a walker and ADLs are done independently or with family assistance when needed. She is agreeable to short term rehab at [facility]. Patient lives with her son and daughter in law.  Her grandson is also there sometimes.   Employment status:  Retired Forensic scientist:  Medicare PT Recommendations:  [place] / Referral to community resources:  [place]  Patient/Family's Response to care:  Patient and family are agreeable to short term rehab at [facility].   Patient/Family's Understanding of and Emotional Response to Diagnosis, Current Treatment, and Prognosis:  Patient and family understand patient's diagnosis, treatment and prognosis.   Emotional Assessment Appearance:  Appears stated  age Attitude/Demeanor/Rapport:    Affect (typically observed):  Accepting Orientation:  Oriented to Self, Oriented to Place, Oriented to Situation Alcohol / Substance use:  Not Applicable Psych involvement (Current and /or in the community):  No (Comment)  Discharge Needs  Concerns to be addressed:  Discharge Planning Concerns Readmission within the last 30 days:  No Current discharge risk:  None Barriers to Discharge:  No Barriers Identified   Ihor Gully, LCSW 07/01/2018, 3:39 PM

## 2018-07-01 NOTE — Progress Notes (Signed)
Pt has c/o not being able to breathe several times this morning. O2 sat's checked, and in the low to mid 90's each time on 5L O2. Xanax given and O2 tubing switched out to help ease anxiety. Currently content, will continue to monitor and update MD as needed.

## 2018-07-01 NOTE — Progress Notes (Signed)
PROGRESS NOTE  Tiffany Maxwell SITU ERX:[id number] DOB: [birth year] DOA: 06/26/2018 PCP: [name], FNP  Brief History:  84 year old female with a history of chronic respiratory failure on 2 L, COPD, chronic pain, hypertension, systolic and diastolic CHF, hyperlipidemia, and cognitive impairment presenting with at least 1 week history of coughing and shortness of breath. The patient is a poor historian secondary to her cognitive impairment. According to the patient's son, the patient went to urgent care on 05/20/2018. The patient was given azithromycin and prednisone at that time for what appears to be a COPD exacerbation. There was some mild improvement, but in the past week she became more short of breath with increasing cough. There is no fevers, chills, nausea, vomiting, diarrhea, abdominal pain. At the time of admission. Chest x-ray showed a right lower lobe opacity and left lower lobe atelectasis. The patient was started on levofloxacin initially and intravenous steroids.  Assessment/Plan: Acute on chronic respiratory failure with hypoxia -Presently on 3 L nasal cannula -Secondary to COPD exacerbation and pneumonia -Wean oxygen back to baseline 2 L -Pulmonary hygiene  Lobar pneumonia -Continue levofloxacin  COPD exacerbation -continue Pulmicort -Continue duo nebs every 6 -Continue IV steroids--wean to po steroids  Chronic systolic and diastolic CHF -Appears clinically euvolemic -Daily weights -06/20/2017 echo EF 40%, grade 1 DD, trivial AI  Essential hypertension -Continue lisinopril  Severe malnutrition -Continue Ensure  Anxiety -continue celexa and remeron    Disposition Plan:   SNF 7/23 if stable  Family Communication:  No Family at bedside  Consultants:  none  Code Status:  FULL  DVT Prophylaxis:  SCDs   Procedures: As Listed in Progress Note Above  Antibiotics: None    Subjective: Patient states that he is breathing  better.  She still has some mild dyspnea on exertion.  She has a nonproductive cough.  She denies any chest pain, abdominal pain, nausea, vomiting, diarrhea, abdominal pain.  There is no dysuria or hematuria.  Objective: Vitals:   07/01/18 1246 07/01/18 1429 07/01/18 1434 07/01/18 1644  BP:  138/75    Pulse:  97 (!) 53   Resp:  20    Temp:  98.1 F (36.7 C)    TempSrc:      SpO2: 90% (!) 83% (!) 89% (!) 86%  Weight:      Height:        Intake/Output Summary (Last 24 hours) at 07/01/2018 1743 Last data filed at 07/01/2018 0900 Gross per 24 hour  Intake 360 ml  Output 1200 ml  Net -840 ml   Weight change:  Exam:   General:  Pt is alert, follows commands appropriately, not in acute distress  HEENT: No icterus, No thrush, No neck mass, [place]/AT  Cardiovascular: RRR, S1/S2, no rubs, no gallops  Respiratory: Diminished breath sounds in the right base.  Bibasilar crackles.  No wheezing.  Good air movement.  Abdomen: Soft/+BS, non tender, non distended, no guarding  Extremities: No edema, No lymphangitis, No petechiae, No rashes, no synovitis   Data Reviewed: I have personally reviewed following labs and imaging studies Basic Metabolic Panel: Recent Labs  Lab 06/26/18 2011 06/27/18 0341 06/29/18 0704 07/01/18 0425  NA 137 142 140 140  K 3.5 4.6 4.3 4.4  CL 93* 99 95* 91*  CO2 37* 37* 37* 43*  GLUCOSE 106* 162* 130* 116*  BUN 8 7* 12 19  CREATININE 0.38* 0.50 0.39* 0.50  CALCIUM 9.5 9.7 9.9 9.7  Liver Function Tests: Recent Labs  Lab 06/26/18 2011  AST 19  ALT 8  ALKPHOS 79  BILITOT 0.4  PROT 6.2*  ALBUMIN 3.3*   No results for input(s): LIPASE, AMYLASE in the last 168 hours. No results for input(s): AMMONIA in the last 168 hours. Coagulation Profile: No results for input(s): INR, PROTIME in the last 168 hours. CBC: Recent Labs  Lab 06/26/18 2011 06/27/18 0341  WBC 6.8 3.8*  NEUTROABS 3.6  --   HGB 11.5* 12.5  HCT 37.8 40.4  MCV 93.6 93.1  PLT 193  210   Cardiac Enzymes: Recent Labs  Lab 06/26/18 2011  TROPONINI <0.03   BNP: Invalid input(s): POCBNP CBG: No results for input(s): GLUCAP in the last 168 hours. HbA1C: No results for input(s): HGBA1C in the last 72 hours. Urine analysis:    Component Value Date/Time   COLORURINE YELLOW 04/07/2014 2303   APPEARANCEUR CLEAR 04/07/2014 2303   LABSPEC 1.010 04/07/2014 2303   PHURINE 6.0 04/07/2014 2303   GLUCOSEU NEGATIVE 04/07/2014 2303   HGBUR NEGATIVE 04/07/2014 2303   BILIRUBINUR NEGATIVE 04/07/2014 2303   KETONESUR NEGATIVE 04/07/2014 2303   PROTEINUR NEGATIVE 04/07/2014 2303   UROBILINOGEN 0.2 04/07/2014 2303   NITRITE NEGATIVE 04/07/2014 2303   LEUKOCYTESUR NEGATIVE 04/07/2014 2303   Sepsis Labs: @LABRCNTIP (procalcitonin:4,lacticidven:4) ) Recent Results (from the past 240 hour(s))  MRSA PCR Screening     Status: None   Collection Time: 06/26/18 11:02 PM  Result Value Ref Range Status   MRSA by PCR NEGATIVE NEGATIVE Final    Comment:        The GeneXpert MRSA Assay (FDA approved for NASAL specimens only), is one component of a comprehensive MRSA colonization surveillance program. It is not intended to diagnose MRSA infection nor to guide or monitor treatment for MRSA infections. Performed at [facility], [address] [place], [place] [address]      Scheduled Meds: . budesonide (PULMICORT) nebulizer solution  0.5 mg Nebulization BID  . calcium-vitamin D  1 tablet Oral BID  . citalopram  20 mg Oral Daily  . feeding supplement (ENSURE ENLIVE)  237 mL Oral BID BM  . ferrous sulfate  325 mg Oral BID WC  . guaiFENesin  600 mg Oral BID  . ipratropium-albuterol  3 mL Nebulization Q6H  . lisinopril  20 mg Oral Daily  . methylPREDNISolone (SOLU-MEDROL) injection  40 mg Intravenous Q6H  . mirtazapine  15 mg Oral QHS  . pantoprazole  40 mg Oral Daily  . sodium chloride flush  3 mL Intravenous Q12H  . vitamin C  500 mg Oral BID   Continuous Infusions: .  sodium chloride    . levofloxacin (LEVAQUIN) IV Stopped (07/01/18 0259)    Procedures/Studies: Dg Chest 2 View  Result Date: 06/26/2018 CLINICAL DATA:  Cough, shortness of breath EXAM: CHEST - 2 VIEW COMPARISON:  05/30/2018 FINDINGS: Right lower lobe opacity, suspicious for pneumonia. Mild left basilar opacity, atelectasis versus pneumonia. Chronic interstitial markings. No frank interstitial edema. No definite pleural effusions. No pneumothorax. Heart is normal in size. Degenerative changes of the visualized thoracolumbar spine. Stable midthoracic compression fracture deformities. IMPRESSION: Right lower lobe opacity, suspicious for pneumonia. Mild left basilar opacity, atelectasis versus pneumonia. Electronically Signed   By: Julian Hy M.D.   On: 06/26/2018 19:44    Orson Eva, DO  [facility] Pager [phone]  If 7PM-7AM, please contact night-coverage [email or web address] Password [facility] 07/01/2018, 5:43 PM   LOS: 5 days

## 2018-07-01 NOTE — Care Management Important Message (Signed)
Important Message  Patient Details  Name: Tiffany Maxwell MRN: [id number] Date of Birth: Mar 22, 1934   Medicare Important Message Given:  Yes    Shelda Altes 07/01/2018, 11:51 AM

## 2018-07-01 NOTE — Progress Notes (Signed)
Physical Therapy Treatment Patient Details Name: Tiffany Maxwell MRN: [id number] DOB: [birth year] Today's Date: 07/01/2018    History of Present Illness Tiffany Maxwell is a 84 y.o. female with medical history significant of advanced COPD, chronic respiratory failure on 2 L of oxygen at home, chronic pain, hypertension, CHF comes in for several days of worsening wheezing shortness of breath and cough at home.  Her cough is productive and nonbloody.  She denies any lower extremity edema or swelling.  She denies any nausea vomiting or diarrhea.  She denies any recent antibiotic usage or recent hospitalizations.  Patient found to have pneumonia on chest x-ray referred for admission for pneumonia and in the setting of acute COPD exacerbation.  On arrival to ED she was slightly hypoxic on her 2 L of oxygen she was given several nebulizer treatments and her oxygen was increased to 3 L and she is doing well with that.    PT Comments    Patient demonstrates slow labored movement for taking steps with frequent near loss of balance, required numerous standing rest breaks due to SOB, limited to walking in room and tolerated sitting up in chair after therapy.  Patient will benefit from continued physical therapy in hospital and recommended venue below to increase strength, balance, endurance for safe ADLs and gait.   Follow Up Recommendations  SNF;Supervision/Assistance - 24 hour     Equipment Recommendations  None recommended by PT    Recommendations for Other Services       Precautions / Restrictions Precautions Precautions: Fall Restrictions Weight Bearing Restrictions: No    Mobility  Bed Mobility Overal bed mobility: Modified Independent                Transfers Overall transfer level: Needs assistance Equipment used: Rolling walker (2 wheeled) Transfers: Sit to/from [facility] Sit to Stand: Min assist Stand pivot transfers: Min assist       General  transfer comment: slow labored  movement  Ambulation/Gait Ambulation/Gait assistance: Min assist;Mod assist Gait Distance (Feet): 15 Feet Assistive device: Rolling walker (2 wheeled) Gait Pattern/deviations: Decreased step length - right;Decreased step length - left;Decreased stride length Gait velocity: slow   General Gait Details: demonstrates slow labored unsteady cadence with frequent standing rest breaks due to SOB, limited mostly due to fatigue/SOB with O2 sats between 87-90% while on 2 LPM O2   Stairs             Wheelchair Mobility    Modified Rankin (Stroke Patients Only)       Balance Overall balance assessment: Needs assistance Sitting-balance support: Feet supported;No upper extremity supported Sitting balance-Leahy Scale: Good     Standing balance support: Bilateral upper extremity supported;During functional activity Standing balance-Leahy Scale: Fair                              Cognition Arousal/Alertness: Awake/alert Behavior During Therapy: WFL for tasks assessed/performed Overall Cognitive Status: Within Functional Limits for tasks assessed                                        Exercises      General Comments        Pertinent Vitals/Pain Pain Assessment: No/denies pain    Home Living  Prior Function            PT Goals (current goals can now be found in the care plan section) Acute Rehab PT Goals Patient Stated Goal: return home able walk PT Goal Formulation: With patient Time For Goal Achievement: 07/18/18 Potential to Achieve Goals: Good Progress towards PT goals: Progressing toward goals    Frequency    Min 3X/week      PT Plan Current plan remains appropriate    Co-evaluation              AM-PAC PT "6 Clicks" Daily Activity  Outcome Measure  Difficulty turning over in bed (including adjusting bedclothes, sheets and blankets)?: None Difficulty  moving from lying on back to sitting on the side of the bed? : None Difficulty sitting down on and standing up from a chair with arms (e.g., wheelchair, bedside commode, etc,.)?: A Little Help needed moving to and from a bed to chair (including a wheelchair)?: A Little Help needed walking in hospital room?: A Lot Help needed climbing 3-5 steps with a railing? : A Lot 6 Click Score: 18    End of Session Equipment Utilized During Treatment: Oxygen Activity Tolerance: Patient tolerated treatment well;Patient limited by fatigue(Patient limited by SOB) Patient left: in chair;with call bell/phone within reach;with chair alarm set Nurse Communication: Mobility status;Other (comment)(RN aware that patient left up in chair) PT Visit Diagnosis: Unsteadiness on feet (R26.81);Other abnormalities of gait and mobility (R26.89);Muscle weakness (generalized) (M62.81)     Time: 1120-1150 PT Time Calculation (min) (ACUTE ONLY): 30 min  Charges:  $Therapeutic Activity: 23-37 mins                    G Codes:       1:48 PM, 07/22/18 Lonell Grandchild, MPT Physical Therapist with [facility] [phone] office [phone] mobile phone

## 2018-07-01 NOTE — Progress Notes (Signed)
Pharmacy Note: Levaquin dosing  Today is day #6 of Levaquin therapy for this 49 yof with HCAP pneumonia. Renal function is unstable. No other cultures have been ordered.  Patient is currently afebrile.  Estimated Creatinine Clearance: 38.9 mL/min (by C-G formula based on SCr of 0.5 mg/dL).   Intake/Output Summary (Last 24 hours) at 07/01/2018 1451 Last data filed at 07/01/2018 0900 Gross per 24 hour  Intake 480 ml  Output 1200 ml  Net -720 ml    Antimicrobials this admission:  Vanc 7/17 x 1 Cefepime 7/17 x 1 Levaquin 7/17 >>  Dose adjustments this admission:  n/a   Microbiology results:     7-17  MRSA PCR:  negative  Plan: Continue Levaquin 750mg  IV q48h Pharmacy will continue to follow cultures, labs and patient progress.  Despina Pole, Pharm. D. Clinical Pharmacist 07/01/2018 2:48 PM

## 2018-07-01 NOTE — Progress Notes (Signed)
Patient has been on f 5 liters of oxygen during weekend.  Patient chronically on 2 liters.  Patient has maintained oxygen saturations on 4.5 liters.

## 2018-07-02 DIAGNOSIS — R4189 Other symptoms and signs involving cognitive functions and awareness: Secondary | ICD-10-CM | POA: Diagnosis not present

## 2018-07-02 DIAGNOSIS — J441 Chronic obstructive pulmonary disease with (acute) exacerbation: Secondary | ICD-10-CM | POA: Diagnosis not present

## 2018-07-02 DIAGNOSIS — R279 Unspecified lack of coordination: Secondary | ICD-10-CM | POA: Diagnosis not present

## 2018-07-02 DIAGNOSIS — I5020 Unspecified systolic (congestive) heart failure: Secondary | ICD-10-CM | POA: Diagnosis not present

## 2018-07-02 DIAGNOSIS — I10 Essential (primary) hypertension: Secondary | ICD-10-CM | POA: Diagnosis not present

## 2018-07-02 DIAGNOSIS — E642 Sequelae of vitamin C deficiency: Secondary | ICD-10-CM | POA: Diagnosis not present

## 2018-07-02 DIAGNOSIS — Q2546 Tortuous aortic arch: Secondary | ICD-10-CM | POA: Diagnosis not present

## 2018-07-02 DIAGNOSIS — E43 Unspecified severe protein-calorie malnutrition: Secondary | ICD-10-CM | POA: Diagnosis not present

## 2018-07-02 DIAGNOSIS — J449 Chronic obstructive pulmonary disease, unspecified: Secondary | ICD-10-CM | POA: Diagnosis not present

## 2018-07-02 DIAGNOSIS — E119 Type 2 diabetes mellitus without complications: Secondary | ICD-10-CM | POA: Diagnosis not present

## 2018-07-02 DIAGNOSIS — J181 Lobar pneumonia, unspecified organism: Secondary | ICD-10-CM | POA: Diagnosis not present

## 2018-07-02 DIAGNOSIS — K210 Gastro-esophageal reflux disease with esophagitis: Secondary | ICD-10-CM | POA: Diagnosis not present

## 2018-07-02 DIAGNOSIS — F419 Anxiety disorder, unspecified: Secondary | ICD-10-CM | POA: Diagnosis not present

## 2018-07-02 DIAGNOSIS — Z7401 Bed confinement status: Secondary | ICD-10-CM | POA: Diagnosis not present

## 2018-07-02 DIAGNOSIS — J9621 Acute and chronic respiratory failure with hypoxia: Secondary | ICD-10-CM | POA: Diagnosis not present

## 2018-07-02 DIAGNOSIS — R1310 Dysphagia, unspecified: Secondary | ICD-10-CM | POA: Diagnosis not present

## 2018-07-02 DIAGNOSIS — M6281 Muscle weakness (generalized): Secondary | ICD-10-CM | POA: Diagnosis not present

## 2018-07-02 DIAGNOSIS — J9601 Acute respiratory failure with hypoxia: Secondary | ICD-10-CM | POA: Diagnosis not present

## 2018-07-02 DIAGNOSIS — R05 Cough: Secondary | ICD-10-CM | POA: Diagnosis not present

## 2018-07-02 DIAGNOSIS — R41841 Cognitive communication deficit: Secondary | ICD-10-CM | POA: Diagnosis not present

## 2018-07-02 DIAGNOSIS — I5042 Chronic combined systolic (congestive) and diastolic (congestive) heart failure: Secondary | ICD-10-CM | POA: Diagnosis not present

## 2018-07-02 DIAGNOSIS — R739 Hyperglycemia, unspecified: Secondary | ICD-10-CM | POA: Diagnosis not present

## 2018-07-02 DIAGNOSIS — J189 Pneumonia, unspecified organism: Secondary | ICD-10-CM | POA: Diagnosis not present

## 2018-07-02 MED ORDER — METOPROLOL SUCCINATE ER 25 MG PO TB24
12.5000 mg | ORAL_TABLET | Freq: Every day | ORAL | Status: DC
  Administered 2018-07-02: 12.5 mg via ORAL
  Filled 2018-07-02: qty 1

## 2018-07-02 MED ORDER — METOPROLOL SUCCINATE ER 25 MG PO TB24: 12.5000 mg | ORAL_TABLET | Freq: Every day | ORAL | 1 refills | Status: DC

## 2018-07-02 MED ORDER — PREDNISONE 10 MG PO TABS: 60.0000 mg | ORAL_TABLET | Freq: Every day | ORAL | 0 refills | Status: DC

## 2018-07-02 NOTE — Progress Notes (Signed)
Dose of levofloxacin given early for discharge per Dr Tat verbal request.

## 2018-07-02 NOTE — Care Management Note (Signed)
Case Management Note  Patient Details  Name: Tiffany Maxwell MRN: [id number] Date of Birth: 09/24/1934  Expected Discharge Date:  07/02/18               Expected Discharge Plan:  [place]  In-House Referral:  Clinical Social Work  Discharge planning Services  CM Consult  Post Acute Care Ch Status of Service:  Completed, signed off  If discussed at [place] of Stay Meetings, dates discussed:  07/02/18  Additional Comments:  Sherald Barge, RN 07/02/2018, 10:55 AM

## 2018-07-02 NOTE — Discharge Summary (Addendum)
Physician Discharge Summary  Tiffany Maxwell:[id number] DOB: 26-Sep-1934 DOA: 06/26/2018  PCP: [name], FNP  Admit date: 06/26/2018 Discharge date: 07/02/2018  Admitted From: Home Disposition:  SNF  Recommendations for Outpatient Follow-up:  1. Follow up with PCP in 1-2 weeks 2. Please obtain BMP/CBC in one week 3. Maintain patient on 2L [place] oxygen   Discharge Condition: Stable CODE STATUS: FULL Diet recommendation: Heart Healthy  Brief/Interim Summary: 84 year old female with a history of chronic respiratory failure on 2 L, COPD, chronic pain, hypertension, systolic and diastolic CHF, hyperlipidemia, and cognitive impairment presenting with at least 1 week history of coughing and shortness of breath. The patient is a poor historian secondary to her cognitive impairment. According to the patient's son, the patient went to urgent care on 05/20/2018. The patient was given azithromycin and prednisone at that time for what appears to be a COPD exacerbation. There was some mild improvement, but in the past week she became more short of breath with increasing cough. There is no fevers, chills, nausea, vomiting, diarrhea, abdominal pain. At the time of admission. Chest x-ray showed a right lower lobe opacity and left lower lobe atelectasis. The patient was started onlevofloxacininitially and intravenous steroids.    Discharge Diagnoses:  Acute on chronic respiratory failure with hypoxia -Presently on 3 L nasal cannula -Secondary to COPD exacerbation and pneumonia -Weaned oxygen back to baseline 2 L -Pulmonary hygiene  Lobar pneumonia -Continue levofloxacin-finished 7 days in hospital  COPD exacerbation -continuePulmicort -Continue duo nebs every 6 -Continue IV steroids--wean to po steroids -d/c with prednisone taper  Chronic systolic and diastolic CHF -Appears clinically euvolemic -Daily weights -06/20/2017 echo EF 40%, grade 1 DD, trivial  AI -continue lisinopril -start metoprolol succinate low dose  Essential hypertension -Continue lisinopril -added metoprolol succinate  Severe malnutrition -Continue Ensure  Anxiety -continue celexa and remeron     Discharge Instructions   Allergies as of 07/02/2018      Reactions   Codeine Other (See Comments)   Headache   Penicillins    Has patient had a PCN reaction causing immediate rash, facial/tongue/throat swelling, SOB or lightheadedness with hypotension: Yes Has patient had a PCN reaction causing severe rash involving mucus membranes or skin necrosis: No Has patient had a PCN reaction that required hospitalization: Unknown Has patient had a PCN reaction occurring within the last 10 years: Unknown If all of the above answers are "NO", then may proceed with Cephalosporin use.   Benicar Hct [olmesartan Medoxomil-hctz] Swelling, Rash   Hctz [hydrochlorothiazide] Swelling, Rash   Naproxen Itching, Rash, Other (See Comments)   Redness/flushing of skin      Medication List    TAKE these medications   Calcium Carbonate-Vitamin D 600-400 MG-UNIT tablet Commonly known as:  CALCIUM 600+D3 Take 1 tablet by mouth 2 (two) times daily.   citalopram 20 MG tablet Commonly known as:  CELEXA Take 1 tablet (20 mg total) by mouth daily.   feeding supplement (ENSURE ENLIVE) Liqd Take 237 mLs by mouth 2 (two) times daily between meals.   ferrous sulfate 325 (65 FE) MG tablet Take 1 tablet (325 mg total) by mouth 2 (two) times daily with a meal.   Ipratropium-Albuterol 20-100 MCG/ACT Aers respimat Commonly known as:  COMBIVENT RESPIMAT Inhale 1 puff into the lungs every 6 (six) hours as needed for wheezing.   lisinopril 20 MG tablet Commonly known as:  PRINIVIL,ZESTRIL Take 1 tablet (20 mg total) by mouth daily.   metoprolol succinate 25 MG 24 hr  tablet Commonly known as:  TOPROL-XL Take 0.5 tablets (12.5 mg total) by mouth daily.   mirtazapine 15 MG  tablet Commonly known as:  REMERON Take 1 tablet (15 mg total) by mouth at bedtime.   OXYGEN Inhale 4 L into the lungs See admin instructions. TO MAINTAIN SATS OF 93% OR GREATER   pantoprazole 40 MG tablet Commonly known as:  PROTONIX Take 1 tablet (40 mg total) by mouth daily.   polyethylene glycol powder powder Commonly known as:  GLYCOLAX/MIRALAX 17 GRAMS (1 CAPFUL) ONCE A DAY AS DIRECTED What changed:  See the new instructions.   predniSONE 10 MG tablet Commonly known as:  DELTASONE Take 6 tablets (60 mg total) by mouth daily with breakfast. And decrease by one tablet daily Start taking on:  07/03/2018   VENTOLIN HFA 108 (90 Base) MCG/ACT inhaler Generic drug:  albuterol 2 PUFFS EVERY 6 HOURS AS NEEDED FOR WHEEZING OR SHORTNESS OF BREATH What changed:  See the new instructions.   vitamin C 500 MG tablet Commonly known as:  ASCORBIC ACID Take 500 mg by mouth 2 (two) times daily.   XANAX 0.25 MG tablet Generic drug:  ALPRAZolam Take 0.25 mg by mouth 3 (three) times daily.      Follow-up Information    [facility] Follow up.   Why:  PT Contact information: [address] [place] [address] [phone]         Allergies  Allergen Reactions  . Codeine Other (See Comments)    Headache   . Penicillins     Has patient had a PCN reaction causing immediate rash, facial/tongue/throat swelling, SOB or lightheadedness with hypotension: Yes Has patient had a PCN reaction causing severe rash involving mucus membranes or skin necrosis: No Has patient had a PCN reaction that required hospitalization: Unknown Has patient had a PCN reaction occurring within the last 10 years: Unknown If all of the above answers are "NO", then may proceed with Cephalosporin use.  Marland Kitchen Benicar Hct [Olmesartan Medoxomil-Hctz] Swelling and Rash  . Hctz [Hydrochlorothiazide] Swelling and Rash  . Naproxen Itching, Rash and Other (See Comments)    Redness/flushing of  skin    Consultations:  none   Procedures/Studies: Dg Chest 2 View  Result Date: 06/26/2018 CLINICAL DATA:  Cough, shortness of breath EXAM: CHEST - 2 VIEW COMPARISON:  05/30/2018 FINDINGS: Right lower lobe opacity, suspicious for pneumonia. Mild left basilar opacity, atelectasis versus pneumonia. Chronic interstitial markings. No frank interstitial edema. No definite pleural effusions. No pneumothorax. Heart is normal in size. Degenerative changes of the visualized thoracolumbar spine. Stable midthoracic compression fracture deformities. IMPRESSION: Right lower lobe opacity, suspicious for pneumonia. Mild left basilar opacity, atelectasis versus pneumonia. Electronically Signed   By: Julian Hy M.D.   On: 06/26/2018 19:44        Discharge Exam: Vitals:   07/02/18 0812 07/02/18 0815  BP:    Pulse: 81   Resp:    Temp:    SpO2: 96% 96%   Vitals:   07/02/18 0537 07/02/18 0811 07/02/18 0812 07/02/18 0815  BP: (!) 171/95     Pulse: 89  81   Resp: 20     Temp: 98.2 F (36.8 C)     TempSrc:      SpO2: 93% 96% 96% 96%  Weight:      Height:        General: Pt is alert, awake, not in acute distress Cardiovascular: RRR, S1/S2 +, no rubs, no  gallops Respiratory: CTA bilaterally, no wheezing, no rhonchi Abdominal: Soft, NT, ND, bowel sounds + Extremities: no edema, no cyanosis   The results of significant diagnostics from this hospitalization (including imaging, microbiology, ancillary and laboratory) are listed below for reference.    Significant Diagnostic Studies: Dg Chest 2 View  Result Date: 06/26/2018 CLINICAL DATA:  Cough, shortness of breath EXAM: CHEST - 2 VIEW COMPARISON:  05/30/2018 FINDINGS: Right lower lobe opacity, suspicious for pneumonia. Mild left basilar opacity, atelectasis versus pneumonia. Chronic interstitial markings. No frank interstitial edema. No definite pleural effusions. No pneumothorax. Heart is normal in size. Degenerative changes of the  visualized thoracolumbar spine. Stable midthoracic compression fracture deformities. IMPRESSION: Right lower lobe opacity, suspicious for pneumonia. Mild left basilar opacity, atelectasis versus pneumonia. Electronically Signed   By: Julian Hy M.D.   On: 06/26/2018 19:44     Microbiology: Recent Results (from the past 240 hour(s))  MRSA PCR Screening     Status: None   Collection Time: 06/26/18 11:02 PM  Result Value Ref Range Status   MRSA by PCR NEGATIVE NEGATIVE Final    Comment:        The GeneXpert MRSA Assay (FDA approved for NASAL specimens only), is one component of a comprehensive MRSA colonization surveillance program. It is not intended to diagnose MRSA infection nor to guide or monitor treatment for MRSA infections. Performed at [facility], [address] [place], [place] [address]      Labs: Basic Metabolic Panel: Recent Labs  Lab 06/26/18 2011 06/27/18 0341 06/29/18 0704 07/01/18 0425  NA 137 142 140 140  K 3.5 4.6 4.3 4.4  CL 93* 99 95* 91*  CO2 37* 37* 37* 43*  GLUCOSE 106* 162* 130* 116*  BUN 8 7* 12 19  CREATININE 0.38* 0.50 0.39* 0.50  CALCIUM 9.5 9.7 9.9 9.7   Liver Function Tests: Recent Labs  Lab 06/26/18 2011  AST 19  ALT 8  ALKPHOS 79  BILITOT 0.4  PROT 6.2*  ALBUMIN 3.3*   No results for input(s): LIPASE, AMYLASE in the last 168 hours. No results for input(s): AMMONIA in the last 168 hours. CBC: Recent Labs  Lab 06/26/18 2011 06/27/18 0341  WBC 6.8 3.8*  NEUTROABS 3.6  --   HGB 11.5* 12.5  HCT 37.8 40.4  MCV 93.6 93.1  PLT 193 210   Cardiac Enzymes: Recent Labs  Lab 06/26/18 2011  TROPONINI <0.03   BNP: Invalid input(s): POCBNP CBG: No results for input(s): GLUCAP in the last 168 hours.  Time coordinating discharge:  36 minutes  Signed:  Orson Eva, DO [facility] Pager: [phone] 07/02/2018, 10:52 AM

## 2018-07-02 NOTE — Progress Notes (Signed)
Report called to [facility]. Pt transported via EMS.

## 2018-07-02 NOTE — Clinical Social Work Placement (Signed)
   CLINICAL SOCIAL WORK PLACEMENT  NOTE  Date:  07/02/2018  Patient Details  Name: Tiffany Maxwell MRN: [id number] Date of Birth: [birth year]  Clinical Social Work is seeking post-discharge placement for this patient at the [place] level of care (*CSW will initial, date and re-position this form in  chart as items are completed):  Yes   Patient/family provided with [place] Work Department's list of facilities offering this level of care within the geographic area requested by the patient (or if unable, by the patient's family).  Yes   Patient/family informed of their freedom to choose among providers that offer the needed level of care, that participate in Medicare, Medicaid or managed care program needed by the patient, have an available bed and are willing to accept the patient.  Yes   Patient/family informed of [facility]'s ownership interest in [facility] and [facility], as well as of the fact that they are under no obligation to receive care at these facilities.  PASRR submitted to EDS on       PASRR number received on       Existing PASRR number confirmed on 07/01/18     FL2 transmitted to all facilities in geographic area requested by pt/family on 07/01/18     FL2 transmitted to all facilities within larger geographic area on       Patient informed that his/her managed care company has contracts with or will negotiate with certain facilities, including the following:            Patient/family informed of bed offers received.  Patient chooses bed at [facility]     Physician recommends and patient chooses bed at      Patient to be transferred to [facility] on 07/02/18.  Patient to be transferred to facility by [facility]     Patient family notified on 07/02/18 of transfer.  Name of family member notified:  Message left for son Louie Casa     PHYSICIAN        Additional Comment:   Facility notified and discharge  clinicals sent. Patient going to room 112A. _______________________________________________ Ihor Gully, LCSW 07/02/2018, 12:11 PM

## 2018-07-04 DIAGNOSIS — I10 Essential (primary) hypertension: Secondary | ICD-10-CM | POA: Diagnosis not present

## 2018-07-04 DIAGNOSIS — J441 Chronic obstructive pulmonary disease with (acute) exacerbation: Secondary | ICD-10-CM | POA: Diagnosis not present

## 2018-07-04 DIAGNOSIS — R4189 Other symptoms and signs involving cognitive functions and awareness: Secondary | ICD-10-CM | POA: Diagnosis not present

## 2018-07-04 DIAGNOSIS — M6281 Muscle weakness (generalized): Secondary | ICD-10-CM | POA: Diagnosis not present

## 2018-07-11 DIAGNOSIS — I10 Essential (primary) hypertension: Secondary | ICD-10-CM | POA: Diagnosis not present

## 2018-07-11 DIAGNOSIS — E43 Unspecified severe protein-calorie malnutrition: Secondary | ICD-10-CM | POA: Diagnosis not present

## 2018-07-11 DIAGNOSIS — J441 Chronic obstructive pulmonary disease with (acute) exacerbation: Secondary | ICD-10-CM | POA: Diagnosis not present

## 2018-07-17 DIAGNOSIS — J441 Chronic obstructive pulmonary disease with (acute) exacerbation: Secondary | ICD-10-CM | POA: Diagnosis not present

## 2018-07-17 DIAGNOSIS — I10 Essential (primary) hypertension: Secondary | ICD-10-CM | POA: Diagnosis not present

## 2018-07-17 DIAGNOSIS — K210 Gastro-esophageal reflux disease with esophagitis: Secondary | ICD-10-CM | POA: Diagnosis not present

## 2018-07-17 DIAGNOSIS — E43 Unspecified severe protein-calorie malnutrition: Secondary | ICD-10-CM | POA: Diagnosis not present

## 2018-07-25 ENCOUNTER — Ambulatory Visit: Payer: Medicare Other | Admitting: Nurse Practitioner

## 2018-08-13 DIAGNOSIS — J441 Chronic obstructive pulmonary disease with (acute) exacerbation: Secondary | ICD-10-CM | POA: Diagnosis not present

## 2018-08-13 DIAGNOSIS — R739 Hyperglycemia, unspecified: Secondary | ICD-10-CM | POA: Diagnosis not present

## 2018-08-13 DIAGNOSIS — M6281 Muscle weakness (generalized): Secondary | ICD-10-CM | POA: Diagnosis not present

## 2018-08-13 DIAGNOSIS — F419 Anxiety disorder, unspecified: Secondary | ICD-10-CM | POA: Diagnosis not present

## 2018-09-17 DIAGNOSIS — M6281 Muscle weakness (generalized): Secondary | ICD-10-CM | POA: Diagnosis not present

## 2018-09-17 DIAGNOSIS — I5042 Chronic combined systolic (congestive) and diastolic (congestive) heart failure: Secondary | ICD-10-CM | POA: Diagnosis not present

## 2018-09-17 DIAGNOSIS — K210 Gastro-esophageal reflux disease with esophagitis: Secondary | ICD-10-CM | POA: Diagnosis not present

## 2018-09-17 DIAGNOSIS — E43 Unspecified severe protein-calorie malnutrition: Secondary | ICD-10-CM | POA: Diagnosis not present

## 2018-09-24 DIAGNOSIS — R739 Hyperglycemia, unspecified: Secondary | ICD-10-CM | POA: Diagnosis not present

## 2018-09-24 DIAGNOSIS — J441 Chronic obstructive pulmonary disease with (acute) exacerbation: Secondary | ICD-10-CM | POA: Diagnosis not present

## 2018-09-24 DIAGNOSIS — F419 Anxiety disorder, unspecified: Secondary | ICD-10-CM | POA: Diagnosis not present

## 2018-09-24 DIAGNOSIS — I10 Essential (primary) hypertension: Secondary | ICD-10-CM | POA: Diagnosis not present

## 2018-09-27 DIAGNOSIS — Z23 Encounter for immunization: Secondary | ICD-10-CM | POA: Diagnosis not present

## 2018-11-03 ENCOUNTER — Other Ambulatory Visit
Admission: RE | Admit: 2018-11-03 | Discharge: 2018-11-03 | Disposition: A | Discharge status: Home / Self Care | Payer: Medicare Other | Source: Ambulatory Visit

## 2018-11-03 DIAGNOSIS — Z029 Encounter for administrative examinations, unspecified: Secondary | ICD-10-CM | POA: Diagnosis present

## 2018-11-03 DIAGNOSIS — Q2546 Tortuous aortic arch: Secondary | ICD-10-CM | POA: Diagnosis not present

## 2018-11-03 DIAGNOSIS — J9811 Atelectasis: Secondary | ICD-10-CM | POA: Diagnosis not present

## 2018-11-03 DIAGNOSIS — J449 Chronic obstructive pulmonary disease, unspecified: Secondary | ICD-10-CM | POA: Diagnosis not present

## 2018-11-03 LAB — CBC WITH DIFFERENTIAL/PLATELET
Abs Immature Granulocytes: 0.06 10*3/uL (ref 0.00–0.07)
BASOS PCT: 0 %
Basophils Absolute: 0 10*3/uL (ref 0.0–0.1)
EOS PCT: 2 %
Eosinophils Absolute: 0.2 10*3/uL (ref 0.0–0.5)
HCT: 38.9 % (ref 36.0–46.0)
Hemoglobin: 11.6 g/dL — ABNORMAL LOW (ref 12.0–15.0)
Immature Granulocytes: 1 %
Lymphocytes Relative: 14 %
Lymphs Abs: 1.6 10*3/uL (ref 0.7–4.0)
MCH: 29.2 pg (ref 26.0–34.0)
MCHC: 29.8 g/dL — AB (ref 30.0–36.0)
MCV: 98 fL (ref 80.0–100.0)
MONOS PCT: 9 %
Monocytes Absolute: 1.1 10*3/uL — ABNORMAL HIGH (ref 0.1–1.0)
Neutro Abs: 8.4 10*3/uL — ABNORMAL HIGH (ref 1.7–7.7)
Neutrophils Relative %: 74 %
PLATELETS: 120 10*3/uL — AB (ref 150–400)
RBC: 3.97 MIL/uL (ref 3.87–5.11)
RDW: 11.8 % (ref 11.5–15.5)
WBC: 11.4 10*3/uL — AB (ref 4.0–10.5)
nRBC: 0 % (ref 0.0–0.2)

## 2018-11-03 LAB — COMPREHENSIVE METABOLIC PANEL
ALK PHOS: 50 U/L (ref 38–126)
AST: 19 U/L (ref 15–41)
Albumin: 3 g/dL — ABNORMAL LOW (ref 3.5–5.0)
Anion gap: 10 (ref 5–15)
BILIRUBIN TOTAL: 0.3 mg/dL (ref 0.3–1.2)
BUN: 12 mg/dL (ref 8–23)
CO2: 37 mmol/L — ABNORMAL HIGH (ref 22–32)
Calcium: 9.4 mg/dL (ref 8.9–10.3)
Chloride: 93 mmol/L — ABNORMAL LOW (ref 98–111)
GLUCOSE: 100 mg/dL — AB (ref 70–99)
Potassium: 4.7 mmol/L (ref 3.5–5.1)
Sodium: 140 mmol/L (ref 135–145)
TOTAL PROTEIN: 5.2 g/dL — AB (ref 6.5–8.1)

## 2018-11-08 ENCOUNTER — Emergency Department (HOSPITAL_COMMUNITY)
Admission: EM | Admit: 2018-11-08 | Discharge: 2018-11-08 | Disposition: A | Discharge status: Home / Self Care | Payer: Medicare Other | Attending: Emergency Medicine | Admitting: Emergency Medicine

## 2018-11-08 ENCOUNTER — Encounter (HOSPITAL_COMMUNITY): Payer: Self-pay | Admitting: Emergency Medicine

## 2018-11-08 ENCOUNTER — Emergency Department (HOSPITAL_COMMUNITY): Payer: Medicare Other

## 2018-11-08 ENCOUNTER — Other Ambulatory Visit: Payer: Self-pay

## 2018-11-08 DIAGNOSIS — Z79899 Other long term (current) drug therapy: Secondary | ICD-10-CM | POA: Diagnosis not present

## 2018-11-08 DIAGNOSIS — Z87891 Personal history of nicotine dependence: Secondary | ICD-10-CM | POA: Insufficient documentation

## 2018-11-08 DIAGNOSIS — I10 Essential (primary) hypertension: Secondary | ICD-10-CM | POA: Diagnosis not present

## 2018-11-08 DIAGNOSIS — Z7401 Bed confinement status: Secondary | ICD-10-CM | POA: Diagnosis not present

## 2018-11-08 DIAGNOSIS — I110 Hypertensive heart disease with heart failure: Secondary | ICD-10-CM | POA: Insufficient documentation

## 2018-11-08 DIAGNOSIS — R4182 Altered mental status, unspecified: Secondary | ICD-10-CM | POA: Insufficient documentation

## 2018-11-08 DIAGNOSIS — R404 Transient alteration of awareness: Secondary | ICD-10-CM

## 2018-11-08 DIAGNOSIS — J449 Chronic obstructive pulmonary disease, unspecified: Secondary | ICD-10-CM | POA: Diagnosis not present

## 2018-11-08 DIAGNOSIS — R509 Fever, unspecified: Secondary | ICD-10-CM | POA: Diagnosis not present

## 2018-11-08 DIAGNOSIS — I5042 Chronic combined systolic (congestive) and diastolic (congestive) heart failure: Secondary | ICD-10-CM | POA: Insufficient documentation

## 2018-11-08 DIAGNOSIS — R0902 Hypoxemia: Secondary | ICD-10-CM | POA: Diagnosis not present

## 2018-11-08 DIAGNOSIS — J9811 Atelectasis: Secondary | ICD-10-CM | POA: Diagnosis not present

## 2018-11-08 DIAGNOSIS — R410 Disorientation, unspecified: Secondary | ICD-10-CM | POA: Diagnosis not present

## 2018-11-08 LAB — URINALYSIS, ROUTINE W REFLEX MICROSCOPIC
Bacteria, UA: NONE SEEN
Bilirubin Urine: NEGATIVE
Glucose, UA: NEGATIVE mg/dL
Hgb urine dipstick: NEGATIVE
Ketones, ur: NEGATIVE mg/dL
Nitrite: NEGATIVE
PROTEIN: NEGATIVE mg/dL
Specific Gravity, Urine: 1.01 (ref 1.005–1.030)
pH: 6 (ref 5.0–8.0)

## 2018-11-08 LAB — TROPONIN I

## 2018-11-08 LAB — COMPREHENSIVE METABOLIC PANEL
ALT: 7 U/L (ref 0–44)
AST: 15 U/L (ref 15–41)
Albumin: 3.4 g/dL — ABNORMAL LOW (ref 3.5–5.0)
Alkaline Phosphatase: 59 U/L (ref 38–126)
Anion gap: 6 (ref 5–15)
BILIRUBIN TOTAL: 0.4 mg/dL (ref 0.3–1.2)
BUN: 8 mg/dL (ref 8–23)
CHLORIDE: 95 mmol/L — AB (ref 98–111)
CO2: 42 mmol/L — AB (ref 22–32)
Calcium: 10.2 mg/dL (ref 8.9–10.3)
Creatinine, Ser: <0.3 mg/dL — ABNORMAL LOW (ref 0.44–1.00)
GLUCOSE: 93 mg/dL (ref 70–99)
POTASSIUM: 3.9 mmol/L (ref 3.5–5.1)
Sodium: 143 mmol/L (ref 135–145)
Total Protein: 6.6 g/dL (ref 6.5–8.1)

## 2018-11-08 LAB — BLOOD GAS, VENOUS
Acid-Base Excess: 18.8 mmol/L — ABNORMAL HIGH (ref 0.0–2.0)
Bicarbonate: 40.2 mmol/L — ABNORMAL HIGH (ref 20.0–28.0)
O2 Content: 3 L/min
O2 SAT: 83.1 %
PATIENT TEMPERATURE: 37
pCO2, Ven: 72.7 mmHg (ref 44.0–60.0)
pH, Ven: 7.406 (ref 7.250–7.430)
pO2, Ven: 47.1 mmHg — ABNORMAL HIGH (ref 32.0–45.0)

## 2018-11-08 LAB — BRAIN NATRIURETIC PEPTIDE: B Natriuretic Peptide: 95 pg/mL (ref 0.0–100.0)

## 2018-11-08 LAB — CBC
HEMATOCRIT: 44.3 % (ref 36.0–46.0)
HEMOGLOBIN: 13 g/dL (ref 12.0–15.0)
MCH: 29.1 pg (ref 26.0–34.0)
MCHC: 29.3 g/dL — AB (ref 30.0–36.0)
MCV: 99.1 fL (ref 80.0–100.0)
Platelets: 151 10*3/uL (ref 150–400)
RBC: 4.47 MIL/uL (ref 3.87–5.11)
RDW: 11.9 % (ref 11.5–15.5)
WBC: 6.4 10*3/uL (ref 4.0–10.5)
nRBC: 0 % (ref 0.0–0.2)

## 2018-11-08 LAB — CBG MONITORING, ED: Glucose-Capillary: 83 mg/dL (ref 70–99)

## 2018-11-08 NOTE — Discharge Instructions (Addendum)
Recheck as needed. Her blood tests and urinalysis are normal. Her chest xray doesn't show a pneumonia.

## 2018-11-08 NOTE — ED Triage Notes (Signed)
Patient transported by [facility]. Patient conscious and alert. Patient warm to touch. EMS called out for altered mental status. Staff at [facility] stated patient was getting a breathing treatment and that the patient would not wake up. Patient denies any pain.

## 2018-11-08 NOTE — ED Notes (Signed)
EMS has  Been called

## 2018-11-08 NOTE — ED Provider Notes (Signed)
[facility] EMERGENCY DEPARTMENT Provider Note   CSN: [id number] Arrival date & time: 11/08/18  1884  Time seen 4:20 AM   History   Chief Complaint Chief Complaint  Patient presents with  . Altered Mental Status    HPI Tiffany Maxwell is a 84 y.o. female.  HPI patient presented to the emergency department via EMS.  She lives in a nursing home and they report she was getting a breathing treatment and that she would not wake up.  Patient arrives awake and alert.  She denies chest pain, coughing, shortness of breath, nausea or vomiting.  She is chronically on 2 L/min nasal cannula oxygen.  Patient know she is in a hospital in [place].  She does not appear to be having any acute distress.  PCP [name], FNP   Past Medical History:  Diagnosis Date  . CHF (congestive heart failure) ([place])   . Cholelithiasis 01/30/2013  . Compression fracture of lumbar spine, non-traumatic ([place]) 04/09/2014  . COPD (chronic obstructive pulmonary disease) ([place])   . Diastolic dysfunction 1/66/0630   Grade 1. Ejection fraction 65%.  . Hip fracture ([place]) 06/19/2017  . History of tobacco use 01/30/2013  . Hyperlipidemia   . Hypertension   . MI (myocardial infarction) ([place])    pt unaware  . Multiple fractures of thoracic spine, closed ([place]) 01/30/2013  . Osteoporosis   . PAC (premature atrial contraction)   . Palpitations   . Pulmonary emphysema ([place]) 01/30/2013  . Seborrheic keratosis     Patient Active Problem List   Diagnosis Date Noted  . HCAP (healthcare-associated pneumonia)   . Lobar pneumonia ([place]) 06/27/2018  . COPD exacerbation ([place]) 06/26/2018  . Acute on chronic respiratory failure with hypoxia ([place]) 06/26/2018  . Primary insomnia 04/22/2018  . Hyperlipidemia 07/10/2017  . Chronic combined systolic (EF 16%) and diastolic CHF, NYHA class 1 ([place]) 07/10/2017  . COPD (chronic obstructive pulmonary disease) ([place]) 07/10/2017  . Closed comminuted intertrochanteric fracture  of proximal end of right femur ([place]) 06/21/2017  . GAD (generalized anxiety disorder) 11/06/2016  . Back pain 04/08/2014  . Pulmonary emphysema ([place]) 01/30/2013  . History of tobacco use 01/30/2013  . Diastolic dysfunction 12/19/3233  . PNA (pneumonia) 01/29/2013  . Osteoporosis 01/29/2013  . Compression fracture of thoracic spine, non-traumatic ([place]) 01/28/2013  . HTN (hypertension) 01/28/2013    Past Surgical History:  Procedure Laterality Date  . COMPRESSION HIP SCREW Left 04/29/2013   Procedure: COMPRESSION HIP;  Surgeon: Sanjuana Kava, MD;  Location: [facility];  Service: Orthopedics;  Laterality: Left;  [place]  . ESOPHAGOGASTRODUODENOSCOPY (EGD) WITH PROPOFOL N/A 07/11/2017   Procedure: ESOPHAGOGASTRODUODENOSCOPY (EGD) WITH PROPOFOL;  Surgeon: Ronald Lobo, MD;  Location: [facility];  Service: Endoscopy;  Laterality: N/A;  . FEMUR IM NAIL Right 06/20/2017   Procedure: INTRAMEDULLARY (IM) NAIL FEMORAL;  Surgeon: Rod Can, MD;  Location: [facility];  Service: Orthopedics;  Laterality: Right;  . TUBAL LIGATION       OB History   None      Home Medications    Prior to Admission medications   Medication Sig Start Date End Date Taking? Authorizing Provider  ALPRAZolam (XANAX) 0.25 MG tablet Take 0.25 mg by mouth 3 (three) times daily.  05/20/18 05/20/19  [provider]  Calcium Carbonate-Vitamin D (CALCIUM 600+D3) 600-400 MG-UNIT per tablet Take 1 tablet by mouth 2 (two) times daily. 04/09/14   Rexene Alberts, MD  citalopram (CELEXA) 20 MG tablet Take 1 tablet (20 mg total)  by mouth daily. 04/22/18   Hassell Done Mary-Margaret, FNP  feeding supplement, ENSURE ENLIVE, (ENSURE ENLIVE) LIQD Take 237 mLs by mouth 2 (two) times daily between meals. 06/24/17   Mariel Aloe, MD  ferrous sulfate 325 (65 FE) MG tablet Take 1 tablet (325 mg total) by mouth 2 (two) times daily with a meal. 04/22/18   Hassell Done, Mary-Margaret, FNP  Ipratropium-Albuterol (COMBIVENT RESPIMAT) 20-100  MCG/ACT AERS respimat Inhale 1 puff into the lungs every 6 (six) hours as needed for wheezing. 04/22/18   Hassell Done, Mary-Margaret, FNP  lisinopril (PRINIVIL,ZESTRIL) 20 MG tablet Take 1 tablet (20 mg total) by mouth daily. 04/22/18   Hassell Done, Mary-Margaret, FNP  metoprolol succinate (TOPROL-XL) 25 MG 24 hr tablet Take 0.5 tablets (12.5 mg total) by mouth daily. 07/02/18   Orson Eva, MD  mirtazapine (REMERON) 15 MG tablet Take 1 tablet (15 mg total) by mouth at bedtime. 04/22/18   Hassell Done, Mary-Margaret, FNP  OXYGEN Inhale 4 L into the lungs See admin instructions. TO MAINTAIN SATS OF 93% OR GREATER    [provider]  pantoprazole (PROTONIX) 40 MG tablet Take 1 tablet (40 mg total) by mouth daily. 04/22/18   Hassell Done, Mary-Margaret, FNP  polyethylene glycol powder (GLYCOLAX/MIRALAX) powder 17 GRAMS (1 CAPFUL) ONCE A DAY AS DIRECTED Patient taking differently: Mix and drink once a day: 17 grams of powder into 6-8 ounces of juice or water 07/03/16   Hassell Done, Mary-Margaret, FNP  predniSONE (DELTASONE) 10 MG tablet Take 6 tablets (60 mg total) by mouth daily with breakfast. And decrease by one tablet daily 07/03/18   Tat, [name], MD  VENTOLIN HFA 108 (90 Base) MCG/ACT inhaler 2 PUFFS EVERY 6 HOURS AS NEEDED FOR WHEEZING OR SHORTNESS OF BREATH Patient taking differently: Inhale 2 puffs into the lungs every 6 hours as needed for wheezing or shortness of breath 04/03/17   [name], FNP  vitamin C (ASCORBIC ACID) 500 MG tablet Take 500 mg by mouth 2 (two) times daily.    [provider]    Family History Family History  Problem Relation Age of Onset  . Hypertension Mother     Social History Social History   Tobacco Use  . Smoking status: Former Smoker    Packs/day: 1.00    Years: 40.00    Pack years: 40.00    Types: Cigarettes    Last attempt to quit: 07/10/2012    Years since quitting: 6.3  . Smokeless tobacco: Former Network engineer Use Topics  . Alcohol use: No  . Drug  use: No  lives in a NH   Allergies   Codeine; Penicillins; Benicar hct [olmesartan medoxomil-hctz]; Hctz [hydrochlorothiazide]; and Naproxen   Review of Systems Review of Systems  All other systems reviewed and are negative.    Physical Exam Updated Vital Signs BP (!) 143/66 (BP Location: Left Arm)   Pulse (!) 55   Temp 98.4 F (36.9 C) (Rectal)   Resp (!) 22   Ht 5\' 1"  (1.549 m)   Wt 52.9 kg   SpO2 95%   BMI 22.04 kg/m   Vital signs normal except for bradycardia  Physical Exam  Constitutional: She appears well-developed and well-nourished.  Non-toxic appearance. She does not appear ill. No distress.  HENT:  Head: Normocephalic and atraumatic.  Right Ear: External ear normal.  Left Ear: External ear normal.  Nose: Nose normal. No mucosal edema or rhinorrhea.  Mouth/Throat: Oropharynx is clear and moist and mucous membranes are normal. No dental abscesses or uvula  swelling.  Edentulous  Eyes: Pupils are equal, round, and reactive to light. Conjunctivae and EOM are normal.  Neck: Normal range of motion and full passive range of motion without pain. Neck supple.  Cardiovascular: Normal rate, regular rhythm and normal heart sounds. Exam reveals no gallop and no friction rub.  No murmur heard. Pulmonary/Chest: Effort normal and breath sounds normal. No respiratory distress. She has no wheezes. She has no rhonchi. She has no rales. She exhibits no tenderness and no crepitus.  Abdominal: Soft. Normal appearance and bowel sounds are normal. She exhibits no distension. There is no tenderness. There is no rebound and no guarding.  Musculoskeletal: Normal range of motion. She exhibits no edema or tenderness.  Moves all extremities well.   Neurological: She is alert. She has normal strength. No cranial nerve deficit.  Skin: Skin is warm, dry and intact. No rash noted. No erythema. No pallor.  Psychiatric: She has a normal mood and affect. Her speech is normal and behavior is  normal. Her mood appears not anxious.  Nursing note and vitals reviewed.    ED Treatments / Results  Labs (all labs ordered are listed, but only abnormal results are displayed) Results for orders placed or performed during the hospital encounter of 11/08/18  Comprehensive metabolic panel  Result Value Ref Range   Sodium 143 135 - 145 mmol/L   Potassium 3.9 3.5 - 5.1 mmol/L   Chloride 95 (L) 98 - 111 mmol/L   CO2 42 (H) 22 - 32 mmol/L   Glucose, Bld 93 70 - 99 mg/dL   BUN 8 8 - 23 mg/dL   Creatinine, Ser <0.30 (L) 0.44 - 1.00 mg/dL   Calcium 10.2 8.9 - 10.3 mg/dL   Total Protein 6.6 6.5 - 8.1 g/dL   Albumin 3.4 (L) 3.5 - 5.0 g/dL   AST 15 15 - 41 U/L   ALT 7 0 - 44 U/L   Alkaline Phosphatase 59 38 - 126 U/L   Total Bilirubin 0.4 0.3 - 1.2 mg/dL   GFR calc non Af Amer NOT CALCULATED >60 mL/min   GFR calc Af Amer NOT CALCULATED >60 mL/min   Anion gap 6 5 - 15  CBC  Result Value Ref Range   WBC 6.4 4.0 - 10.5 K/uL   RBC 4.47 3.87 - 5.11 MIL/uL   Hemoglobin 13.0 12.0 - 15.0 g/dL   HCT 44.3 36.0 - 46.0 %   MCV 99.1 80.0 - 100.0 fL   MCH 29.1 26.0 - 34.0 pg   MCHC 29.3 (L) 30.0 - 36.0 g/dL   RDW 11.9 11.5 - 15.5 %   Platelets 151 150 - 400 K/uL   nRBC 0.0 0.0 - 0.2 %  Urinalysis, Routine w reflex microscopic  Result Value Ref Range   Color, Urine YELLOW YELLOW   APPearance CLEAR CLEAR   Specific Gravity, Urine 1.010 1.005 - 1.030   pH 6.0 5.0 - 8.0   Glucose, UA NEGATIVE NEGATIVE mg/dL   Hgb urine dipstick NEGATIVE NEGATIVE   Bilirubin Urine NEGATIVE NEGATIVE   Ketones, ur NEGATIVE NEGATIVE mg/dL   Protein, ur NEGATIVE NEGATIVE mg/dL   Nitrite NEGATIVE NEGATIVE   Leukocytes, UA TRACE (A) NEGATIVE   RBC / HPF 0-5 0 - 5 RBC/hpf   WBC, UA 0-5 0 - 5 WBC/hpf   Bacteria, UA NONE SEEN NONE SEEN   Mucus PRESENT    Hyaline Casts, UA PRESENT   Troponin I - Once  Result Value Ref Range   Troponin  I <0.03 <0.03 ng/mL  Blood gas, venous  Result Value Ref Range   O2 Content  3.0 L/min   Delivery systems NASAL CANNULA    pH, Ven 7.406 7.250 - 7.430   pCO2, Ven 72.7 (HH) 44.0 - 60.0 mmHg   pO2, Ven 47.1 (H) 32.0 - 45.0 mmHg   Bicarbonate 40.2 (H) 20.0 - 28.0 mmol/L   Acid-Base Excess 18.8 (H) 0.0 - 2.0 mmol/L   O2 Saturation 83.1 %   Patient temperature 37.0    Drawn by DRAWN BY RN    Sample type VENOUS   Brain natriuretic peptide  Result Value Ref Range   B Natriuretic Peptide 95.0 0.0 - 100.0 pg/mL  CBG monitoring, ED  Result Value Ref Range   Glucose-Capillary 83 70 - 99 mg/dL   Laboratory interpretation all normal except chronic respiratory acidosis with compensatory metabolic alkalosis    EKG EKG Interpretation  Date/Time:  Friday November 08 2018 03:39:16 EST Ventricular Rate:  62 PR Interval:    QRS Duration: 86 QT Interval:  419 QTC Calculation: 426 R Axis:   8 Text Interpretation:  Sinus arrhythmia ST elevation, consider inferior injury Since last tracing rate slower 26 Jun 2018 Confirmed by Rolland Porter [phone]) on 11/08/2018 3:45:38 AM   Radiology Dg Chest Port 1 View  Result Date: 11/08/2018 CLINICAL DATA:  Unresponsive episode. Lethargy during breathing treatment at nursing facility. EXAM: PORTABLE CHEST 1 VIEW COMPARISON:  Frontal and lateral views 06/26/2018 FINDINGS: Low lung volumes lead to bronchovascular crowding. Mild chronic bronchial thickening. The heart is enlarged. Unchanged mediastinal contours with aortic tortuosity. Linear atelectasis or scarring in the right lung. No pleural effusion or pneumothorax. No acute osseous abnormalities. IMPRESSION: Low lung volumes with bronchovascular crowding superimposed on chronic bronchial thickening. Cardiomegaly. Electronically Signed   By: Keith Rake M.D.   On: 11/08/2018 05:00    Procedures Procedures (including critical care time)  Medications Ordered in ED Medications - No data to display   Initial Impression / Assessment and Plan / ED Course  I have reviewed the  triage vital signs and the nursing notes.  Pertinent labs & imaging results that were available during my care of the patient were reviewed by me and considered in my medical decision making (see chart for details).     Patient seems fine now.  She does not seem to recall the incident at her facility.  Laboratory testing was done, x-ray was done.  Check at 6:50 AM patient is sleeping.  Her respiratory rate is 22, she does not appear to be in respiratory distress.  Listen to her her lungs are clear.  I reassessed her and she moves all her extremities well without apparent weakness.  We are waiting on a urine and then she will probably be discharged back to her facility.  Patient's urinalysis was normal, urine culture was sent.  Patient remains easily awakened in the ED.  She is not in any distress.  She was sent back to her nursing facility.  At this point the etiology of her altered mental status is unclear.  But has resolved.  Final Clinical Impressions(s) / ED Diagnoses   Final diagnoses:  Altered consciousness    ED Discharge Orders    None      Plan discharge  Rolland Porter, MD, Barbette Or, MD 11/08/18 [phone]

## 2018-11-08 NOTE — ED Notes (Addendum)
Date and time results received: 11/08/18 04:47 (use smartphrase ".now" to insert current time)  Test: venous blood gas Critical Value: pCO2   72.7 Bicarb/ 40.2  PO2 47.1  PH 7.40  Name of Provider Notified: Dr. Rolland Porter  Orders Received? Dr. Tomi Bamberger notified of results

## 2018-11-08 NOTE — ED Notes (Signed)
EMS here to pick up pt.  

## 2018-11-10 DIAGNOSIS — F419 Anxiety disorder, unspecified: Secondary | ICD-10-CM | POA: Diagnosis not present

## 2018-11-10 DIAGNOSIS — F339 Major depressive disorder, recurrent, unspecified: Secondary | ICD-10-CM | POA: Diagnosis not present

## 2018-11-10 DIAGNOSIS — K210 Gastro-esophageal reflux disease with esophagitis: Secondary | ICD-10-CM | POA: Diagnosis not present

## 2018-11-10 DIAGNOSIS — J441 Chronic obstructive pulmonary disease with (acute) exacerbation: Secondary | ICD-10-CM | POA: Diagnosis not present

## 2018-11-10 LAB — URINE CULTURE
Culture: 70000 — AB
Special Requests: NORMAL

## 2018-11-11 ENCOUNTER — Telehealth: Payer: Self-pay | Admitting: Emergency Medicine

## 2018-11-11 NOTE — Telephone Encounter (Signed)
Post ED Visit - Positive Culture Follow-up  Culture report reviewed by antimicrobial stewardship pharmacist:  [x]  Elenor Quinones, Pharm.D. []  Heide Guile, Pharm.D., BCPS AQ-ID []  Parks Neptune, Pharm.D., BCPS []  Alycia Rossetti, Pharm.D., BCPS []  [place], Pharm.D., BCPS, AAHIVP []  Legrand Como, Pharm.D., BCPS, AAHIVP []  Salome Arnt, PharmD, BCPS []  Johnnette Gourd, PharmD, BCPS []  Hughes Better, PharmD, BCPS []  Leeroy Cha, PharmD  Positive urine culture Asymptomatic and no further patient follow-up is required at this time.  Hazle Nordmann 11/11/2018, 9:27 AM

## 2018-11-21 DIAGNOSIS — J441 Chronic obstructive pulmonary disease with (acute) exacerbation: Secondary | ICD-10-CM | POA: Diagnosis not present

## 2018-11-22 DIAGNOSIS — J441 Chronic obstructive pulmonary disease with (acute) exacerbation: Secondary | ICD-10-CM | POA: Diagnosis not present

## 2018-11-25 DIAGNOSIS — J441 Chronic obstructive pulmonary disease with (acute) exacerbation: Secondary | ICD-10-CM | POA: Diagnosis not present

## 2018-11-26 DIAGNOSIS — J441 Chronic obstructive pulmonary disease with (acute) exacerbation: Secondary | ICD-10-CM | POA: Diagnosis not present

## 2018-11-27 DIAGNOSIS — J441 Chronic obstructive pulmonary disease with (acute) exacerbation: Secondary | ICD-10-CM | POA: Diagnosis not present

## 2018-11-28 DIAGNOSIS — J441 Chronic obstructive pulmonary disease with (acute) exacerbation: Secondary | ICD-10-CM | POA: Diagnosis not present

## 2018-11-29 DIAGNOSIS — J441 Chronic obstructive pulmonary disease with (acute) exacerbation: Secondary | ICD-10-CM | POA: Diagnosis not present

## 2018-12-01 ENCOUNTER — Encounter (HOSPITAL_COMMUNITY): Payer: Self-pay | Admitting: Emergency Medicine

## 2018-12-01 ENCOUNTER — Emergency Department (HOSPITAL_COMMUNITY): Payer: Medicare Other

## 2018-12-01 ENCOUNTER — Emergency Department (HOSPITAL_COMMUNITY)
Admission: EM | Admit: 2018-12-01 | Discharge: 2018-12-01 | Disposition: A | Discharge status: Home / Self Care | Payer: Medicare Other | Attending: Emergency Medicine | Admitting: Emergency Medicine

## 2018-12-01 DIAGNOSIS — R456 Violent behavior: Secondary | ICD-10-CM | POA: Diagnosis not present

## 2018-12-01 DIAGNOSIS — J449 Chronic obstructive pulmonary disease, unspecified: Secondary | ICD-10-CM | POA: Diagnosis not present

## 2018-12-01 DIAGNOSIS — I5042 Chronic combined systolic (congestive) and diastolic (congestive) heart failure: Secondary | ICD-10-CM | POA: Diagnosis not present

## 2018-12-01 DIAGNOSIS — Z79899 Other long term (current) drug therapy: Secondary | ICD-10-CM | POA: Insufficient documentation

## 2018-12-01 DIAGNOSIS — R4182 Altered mental status, unspecified: Secondary | ICD-10-CM | POA: Diagnosis not present

## 2018-12-01 DIAGNOSIS — I110 Hypertensive heart disease with heart failure: Secondary | ICD-10-CM | POA: Insufficient documentation

## 2018-12-01 DIAGNOSIS — R0902 Hypoxemia: Secondary | ICD-10-CM | POA: Diagnosis not present

## 2018-12-01 DIAGNOSIS — R404 Transient alteration of awareness: Secondary | ICD-10-CM | POA: Diagnosis not present

## 2018-12-01 DIAGNOSIS — Z87891 Personal history of nicotine dependence: Secondary | ICD-10-CM | POA: Insufficient documentation

## 2018-12-01 DIAGNOSIS — N390 Urinary tract infection, site not specified: Secondary | ICD-10-CM | POA: Insufficient documentation

## 2018-12-01 DIAGNOSIS — R0689 Other abnormalities of breathing: Secondary | ICD-10-CM | POA: Diagnosis not present

## 2018-12-01 DIAGNOSIS — I499 Cardiac arrhythmia, unspecified: Secondary | ICD-10-CM | POA: Diagnosis not present

## 2018-12-01 DIAGNOSIS — E860 Dehydration: Secondary | ICD-10-CM | POA: Diagnosis not present

## 2018-12-01 DIAGNOSIS — Z7401 Bed confinement status: Secondary | ICD-10-CM | POA: Diagnosis not present

## 2018-12-01 LAB — CBC WITH DIFFERENTIAL/PLATELET
Abs Immature Granulocytes: 0.04 10*3/uL (ref 0.00–0.07)
Basophils Absolute: 0 10*3/uL (ref 0.0–0.1)
Basophils Relative: 1 %
Eosinophils Absolute: 0.2 10*3/uL (ref 0.0–0.5)
Eosinophils Relative: 3 %
HCT: 44.7 % (ref 36.0–46.0)
Hemoglobin: 12.6 g/dL (ref 12.0–15.0)
IMMATURE GRANULOCYTES: 1 %
LYMPHS PCT: 19 %
Lymphs Abs: 1.3 10*3/uL (ref 0.7–4.0)
MCH: 28.6 pg (ref 26.0–34.0)
MCHC: 28.2 g/dL — ABNORMAL LOW (ref 30.0–36.0)
MCV: 101.4 fL — ABNORMAL HIGH (ref 80.0–100.0)
Monocytes Absolute: 0.8 10*3/uL (ref 0.1–1.0)
Monocytes Relative: 11 %
Neutro Abs: 4.5 10*3/uL (ref 1.7–7.7)
Neutrophils Relative %: 65 %
Platelets: 165 10*3/uL (ref 150–400)
RBC: 4.41 MIL/uL (ref 3.87–5.11)
RDW: 12.1 % (ref 11.5–15.5)
WBC: 6.9 10*3/uL (ref 4.0–10.5)
nRBC: 0 % (ref 0.0–0.2)

## 2018-12-01 LAB — AMMONIA: Ammonia: 21 umol/L (ref 9–35)

## 2018-12-01 LAB — COMPREHENSIVE METABOLIC PANEL
ALT: 5 U/L (ref 0–44)
AST: 17 U/L (ref 15–41)
Albumin: 3.3 g/dL — ABNORMAL LOW (ref 3.5–5.0)
Alkaline Phosphatase: 58 U/L (ref 38–126)
Anion gap: 3 — ABNORMAL LOW (ref 5–15)
BUN: 11 mg/dL (ref 8–23)
CHLORIDE: 92 mmol/L — AB (ref 98–111)
CO2: 43 mmol/L — AB (ref 22–32)
Calcium: 10.2 mg/dL (ref 8.9–10.3)
Creatinine, Ser: 0.49 mg/dL (ref 0.44–1.00)
Glucose, Bld: 98 mg/dL (ref 70–99)
POTASSIUM: 5.1 mmol/L (ref 3.5–5.1)
SODIUM: 138 mmol/L (ref 135–145)
Total Bilirubin: 0.5 mg/dL (ref 0.3–1.2)
Total Protein: 6.2 g/dL — ABNORMAL LOW (ref 6.5–8.1)

## 2018-12-01 LAB — LACTIC ACID, PLASMA
Lactic Acid, Venous: 0.7 mmol/L (ref 0.5–1.9)
Lactic Acid, Venous: 0.6 mmol/L (ref 0.5–1.9)

## 2018-12-01 LAB — URINALYSIS, ROUTINE W REFLEX MICROSCOPIC
Bilirubin Urine: NEGATIVE
GLUCOSE, UA: NEGATIVE mg/dL
HGB URINE DIPSTICK: NEGATIVE
KETONES UR: NEGATIVE mg/dL
Nitrite: NEGATIVE
PROTEIN: 100 mg/dL — AB
Specific Gravity, Urine: 1.015 (ref 1.005–1.030)
pH: 5 (ref 5.0–8.0)

## 2018-12-01 LAB — CBG MONITORING, ED
Glucose-Capillary: 106 mg/dL — ABNORMAL HIGH (ref 70–99)
Glucose-Capillary: 90 mg/dL (ref 70–99)

## 2018-12-01 LAB — TROPONIN I

## 2018-12-01 LAB — BRAIN NATRIURETIC PEPTIDE: B NATRIURETIC PEPTIDE 5: 127 pg/mL — AB (ref 0.0–100.0)

## 2018-12-01 MED ORDER — NITROFURANTOIN MONOHYD MACRO 100 MG PO CAPS: 100.0000 mg | ORAL_CAPSULE | Freq: Two times a day (BID) | ORAL | 0 refills | Status: DC

## 2018-12-01 MED ORDER — NITROFURANTOIN MONOHYD MACRO 100 MG PO CAPS
100.0000 mg | ORAL_CAPSULE | Freq: Once | ORAL | Status: AC
  Administered 2018-12-01: 100 mg via ORAL
  Filled 2018-12-01: qty 1

## 2018-12-01 NOTE — ED Notes (Signed)
Spoke with [employer] unknown ETA before a truck will be available to take pt back to Nursing home.

## 2018-12-01 NOTE — ED Notes (Signed)
Called facility to give report, waited 6 minutes with noone coming to the phone

## 2018-12-01 NOTE — ED Notes (Signed)
Pt has been resting quietly with no complaints.

## 2018-12-01 NOTE — ED Notes (Signed)
Attempted to feed pt.  Refused to eat.

## 2018-12-01 NOTE — ED Notes (Signed)
EMS called to send truck to transport pt back to [facility].

## 2018-12-01 NOTE — Discharge Instructions (Addendum)
Urine analysis suggest a urinary tract infection.  Please use Macrobid 2 times daily until all taken.  The test for acute heart related problems and pneumonia tests were all negative.  The potassium is within normal limits.  The renal function studies are elevated, but for the most part are at the usual baseline.  Please have the urine rechecked in 7 to 10 days to ensure that the infection has been completely eradicated.

## 2018-12-01 NOTE — ED Provider Notes (Addendum)
[facility] EMERGENCY DEPARTMENT Provider Note   CSN: [id number] Arrival date & time: 12/01/18  0737     History   Chief Complaint Chief Complaint  Patient presents with  . Altered Mental Status    HPI Tiffany Maxwell is a 84 y.o. female.  LEVEL V CAVEAT DUE TO ALTERED MENTAL STATUS  Patient is an 84 year old female resident of [facility] facility who presents to the emergency department by EMS because of altered mental status.  The nursing staff at [facility] report that the patient woke up this morning and was confused.  She seems to have been hallucinating.  There is been no reported injury to the head.  She has a history of some congestive heart failure, but has not been diagnosed with heart failure recently, or hypoxia.  No recent changes in her medications or diet.  It is of note that she was diagnosed with a urinary tract infection symptoms, she was not treated with antibiotics for this issue.  No other changes noted on the assessment by the team from [facility].  The nursing team reported the patient is usually able to feed herself, and have conversation.  She was unable to do either of these this morning.  It is of note that the patient has a chronic obstructive pulmonary disease, and that she is chronically on 2 L of oxygen by nasal cannula.  The history is provided by the nursing home and the EMS personnel.    Past Medical History:  Diagnosis Date  . CHF (congestive heart failure) ([place])   . Cholelithiasis 01/30/2013  . Compression fracture of lumbar spine, non-traumatic ([place]) 04/09/2014  . COPD (chronic obstructive pulmonary disease) ([place])   . Diastolic dysfunction 12/16/2692   Grade 1. Ejection fraction 65%.  . Hip fracture ([place]) 06/19/2017  . History of tobacco use 01/30/2013  . Hyperlipidemia   . Hypertension   . MI (myocardial infarction) ([place])    pt unaware  . Multiple fractures of thoracic spine, closed ([place]) 01/30/2013  . Osteoporosis   . PAC  (premature atrial contraction)   . Palpitations   . Pulmonary emphysema ([place]) 01/30/2013  . Seborrheic keratosis     Patient Active Problem List   Diagnosis Date Noted  . HCAP (healthcare-associated pneumonia)   . Lobar pneumonia ([place]) 06/27/2018  . COPD exacerbation ([place]) 06/26/2018  . Acute on chronic respiratory failure with hypoxia ([place]) 06/26/2018  . Primary insomnia 04/22/2018  . Hyperlipidemia 07/10/2017  . Chronic combined systolic (EF 85%) and diastolic CHF, NYHA class 1 ([place]) 07/10/2017  . COPD (chronic obstructive pulmonary disease) ([place]) 07/10/2017  . Closed comminuted intertrochanteric fracture of proximal end of right femur ([place]) 06/21/2017  . GAD (generalized anxiety disorder) 11/06/2016  . Back pain 04/08/2014  . Pulmonary emphysema ([place]) 01/30/2013  . History of tobacco use 01/30/2013  . Diastolic dysfunction 46/27/0350  . PNA (pneumonia) 01/29/2013  . Osteoporosis 01/29/2013  . Compression fracture of thoracic spine, non-traumatic ([place]) 01/28/2013  . HTN (hypertension) 01/28/2013    Past Surgical History:  Procedure Laterality Date  . COMPRESSION HIP SCREW Left 04/29/2013   Procedure: COMPRESSION HIP;  Surgeon: Sanjuana Kava, MD;  Location: [facility];  Service: Orthopedics;  Laterality: Left;  [place]  . ESOPHAGOGASTRODUODENOSCOPY (EGD) WITH PROPOFOL N/A 07/11/2017   Procedure: ESOPHAGOGASTRODUODENOSCOPY (EGD) WITH PROPOFOL;  Surgeon: Ronald Lobo, MD;  Location: [place];  Service: Endoscopy;  Laterality: N/A;  . FEMUR IM NAIL Right 06/20/2017   Procedure: INTRAMEDULLARY (IM) NAIL  FEMORAL;  Surgeon: Rod Can, MD;  Location: [place];  Service: Orthopedics;  Laterality: Right;  . TUBAL LIGATION       OB History   No obstetric history on file.      Home Medications    Prior to Admission medications   Medication Sig Start Date End Date Taking? Authorizing Provider  ALPRAZolam (XANAX) 0.25 MG tablet Take 0.25 mg by mouth 3 (three) times daily.   05/20/18 05/20/19  [provider]  Calcium Carbonate-Vitamin D (CALCIUM 600+D3) 600-400 MG-UNIT per tablet Take 1 tablet by mouth 2 (two) times daily. 04/09/14   Rexene Alberts, MD  citalopram (CELEXA) 20 MG tablet Take 1 tablet (20 mg total) by mouth daily. 04/22/18   Hassell Done Mary-Margaret, FNP  feeding supplement, ENSURE ENLIVE, (ENSURE ENLIVE) LIQD Take 237 mLs by mouth 2 (two) times daily between meals. 06/24/17   Mariel Aloe, MD  ferrous sulfate 325 (65 FE) MG tablet Take 1 tablet (325 mg total) by mouth 2 (two) times daily with a meal. 04/22/18   Hassell Done, Mary-Margaret, FNP  Ipratropium-Albuterol (COMBIVENT RESPIMAT) 20-100 MCG/ACT AERS respimat Inhale 1 puff into the lungs every 6 (six) hours as needed for wheezing. 04/22/18   Hassell Done, Mary-Margaret, FNP  lisinopril (PRINIVIL,ZESTRIL) 20 MG tablet Take 1 tablet (20 mg total) by mouth daily. 04/22/18   Hassell Done, Mary-Margaret, FNP  metoprolol succinate (TOPROL-XL) 25 MG 24 hr tablet Take 0.5 tablets (12.5 mg total) by mouth daily. 07/02/18   Orson Eva, MD  mirtazapine (REMERON) 15 MG tablet Take 1 tablet (15 mg total) by mouth at bedtime. 04/22/18   Hassell Done, Mary-Margaret, FNP  OXYGEN Inhale 4 L into the lungs See admin instructions. TO MAINTAIN SATS OF 93% OR GREATER    [provider]  pantoprazole (PROTONIX) 40 MG tablet Take 1 tablet (40 mg total) by mouth daily. 04/22/18   Hassell Done, Mary-Margaret, FNP  polyethylene glycol powder (GLYCOLAX/MIRALAX) powder 17 GRAMS (1 CAPFUL) ONCE A DAY AS DIRECTED Patient taking differently: Mix and drink once a day: 17 grams of powder into 6-8 ounces of juice or water 07/03/16   Hassell Done, Mary-Margaret, FNP  predniSONE (DELTASONE) 10 MG tablet Take 6 tablets (60 mg total) by mouth daily with breakfast. And decrease by one tablet daily 07/03/18   Tat, [name], MD  VENTOLIN HFA 108 (90 Base) MCG/ACT inhaler 2 PUFFS EVERY 6 HOURS AS NEEDED FOR WHEEZING OR SHORTNESS OF BREATH Patient taking differently: Inhale  2 puffs into the lungs every 6 hours as needed for wheezing or shortness of breath 04/03/17   [name], FNP  vitamin C (ASCORBIC ACID) 500 MG tablet Take 500 mg by mouth 2 (two) times daily.    [provider]    Family History Family History  Problem Relation Age of Onset  . Hypertension Mother     Social History Social History   Tobacco Use  . Smoking status: Former Smoker    Packs/day: 1.00    Years: 40.00    Pack years: 40.00    Types: Cigarettes    Last attempt to quit: 07/10/2012    Years since quitting: 6.3  . Smokeless tobacco: Former Network engineer Use Topics  . Alcohol use: No  . Drug use: No     Allergies   Codeine; Penicillins; Benicar hct [olmesartan medoxomil-hctz]; Hctz [hydrochlorothiazide]; and Naproxen   Review of Systems Review of Systems  Unable to perform ROS: Mental status change  Psychiatric/Behavioral: Positive for confusion.     Physical Exam  Updated Vital Signs BP 128/87   Pulse 61   Temp 98.5 F (36.9 C) (Oral)   Resp 18   Ht 5\' 1"  (1.549 m)   Wt 53 kg   SpO2 100%   BMI 22.08 kg/m   Physical Exam HENT:     Head: Normocephalic and atraumatic.     Right Ear: Tympanic membrane normal.     Left Ear: Tympanic membrane normal.     Mouth/Throat:     Mouth: Mucous membranes are moist.     Palate: Palate elevates in midline.     Pharynx: Uvula midline.     Comments: Pt is edentulous Eyes:     Extraocular Movements: Extraocular movements intact.     Conjunctiva/sclera: Conjunctivae normal.     Pupils: Pupils are equal, round, and reactive to light.  Neck:     Musculoskeletal: Normal range of motion and neck supple. No neck rigidity.  Cardiovascular:     Heart sounds: Murmur present.  Pulmonary:     Breath sounds: Wheezing present.     Comments: End expiratory wheezes noted. Symmetrical rise and fall of the chest. Musculoskeletal:        General: No deformity.     Comments: No hot joints. Pt moves  extremities well. No pitting edema of lower extremities.  Lymphadenopathy:     Cervical: No cervical adenopathy.  Skin:    General: Skin is warm.     Capillary Refill: Capillary refill takes 2 to 3 seconds.     Coloration: Skin is not jaundiced.     Findings: No rash.  Neurological:     Mental Status: She is alert. She is disoriented.     Motor: No weakness.      ED Treatments / Results  Labs (all labs ordered are listed, but only abnormal results are displayed) Labs Reviewed  URINE CULTURE  URINALYSIS, ROUTINE W REFLEX MICROSCOPIC  COMPREHENSIVE METABOLIC PANEL  CBC WITH DIFFERENTIAL/PLATELET  LACTIC ACID, PLASMA  LACTIC ACID, PLASMA  TROPONIN I  BRAIN NATRIURETIC PEPTIDE  CBG MONITORING, ED    EKG None  Radiology No results found.  Procedures Procedures (including critical care time)  Medications Ordered in ED Medications - No data to display   Initial Impression / Assessment and Plan / ED Course  I have reviewed the triage vital signs and the nursing notes.  Pertinent labs & imaging results that were available during my care of the patient were reviewed by me and considered in my medical decision making (see chart for details).       Final Clinical Impressions(s) / ED Diagnoses mdm  Vital signs reviewed.  Pulse oximetry is 100% on 2 L of oxygen by nasal cannula.  Patient is chronically on 2 L of oxygen by nasal cannula.  Patient demonstrates confusion that started this morning.  Capillary blood glucose is normal at 106. Pt seen with me by Dr. Mariane Masters.  Comprehensive metabolic panel shows the CO2 to be 43, this is in the range that the patient usually runs according to review of old records.  The albumin is slightly low at 3.3, the remainder is within normal limits.  In particular the potassium is normal at 5.1 and the calcium normal at 10.2.  The anion gap is slightly low at 3. The complete blood count is within normal limits.  The lactic acid is  normal at 0.6. Troponin is negative for acute event.  The B natruretic peptide is elevated at 127.  The urine analysis shows  a turbid specimen with a specific gravity of 1.015.  There is large leukocyte esterase, 21-50 red blood cells and greater than 50 white blood cells.  White blood cells are present in clumps.  There are also noted hyaline cast present.  The patient shows no gross motor or sensory neurologic deficit.  The examination favors urinary tract infection.  Question if the confusion may be related to this issue.  The capillary blood glucose appears to be within normal limits, the electrolytes with the exception of the CO2 are within normal limits.  The patient's pulse oximetry is 100% on room air.  There are no gross cardiac abnormality.  Patient able to take medication for urinary tract infection with applesauce without problems.  Recheck.  No new gross neurologic deficits appreciated.  Patient will be treated with Macrobid.  I have also asked the nursing facility to attempt to increase fluids.  The patient is to have the urine rechecked in 7 to 10 days.  The patient is to return to the emergency department for additional evaluation if not improving.   Final diagnoses:  Lower urinary tract infectious disease  Altered mental status, unspecified altered mental status type    ED Discharge Orders         Ordered    nitrofurantoin, macrocrystal-monohydrate, (MACROBID) 100 MG capsule  2 times daily,   Status:  Discontinued     12/01/18 1510    nitrofurantoin, macrocrystal-monohydrate, (MACROBID) 100 MG capsule  2 times daily     12/01/18 1637           Lily Kocher, PA-C 12/02/18 0930    Lily Kocher, PA-C 12/02/18 1660    Varney Biles, MD 12/02/18 1551

## 2018-12-01 NOTE — ED Notes (Signed)
Called and spoke with pt's family member, Sonia Baller to give an update; still waiting on ems for transport

## 2018-12-01 NOTE — ED Triage Notes (Signed)
Pt brought in by ems for confusion.  Staff states pt woke up confused this morning and has been seeming to hallucinate.  Had positive urine culture a few weeks ago with no treatment.

## 2018-12-02 DIAGNOSIS — J441 Chronic obstructive pulmonary disease with (acute) exacerbation: Secondary | ICD-10-CM | POA: Diagnosis not present

## 2018-12-03 DIAGNOSIS — J441 Chronic obstructive pulmonary disease with (acute) exacerbation: Secondary | ICD-10-CM | POA: Diagnosis not present

## 2018-12-04 LAB — URINE CULTURE

## 2018-12-05 ENCOUNTER — Telehealth: Payer: Self-pay | Admitting: Emergency Medicine

## 2018-12-05 NOTE — Telephone Encounter (Signed)
Post ED Visit - Positive Culture Follow-up: Successful Patient Follow-Up  Culture assessed and recommendations reviewed by:  []  Elenor Quinones, Pharm.D. []  Heide Guile, Pharm.D., BCPS AQ-ID []  Parks Neptune, Pharm.D., BCPS []  Alycia Rossetti, Pharm.D., BCPS []  [place], Pharm.D., BCPS, AAHIVP []  Legrand Como, Pharm.D., BCPS, AAHIVP []  Salome Arnt, PharmD, BCPS []  Johnnette Gourd, PharmD, BCPS []  Hughes Better, PharmD, BCPS []  Leeroy Cha, PharmD Bertis Ruddy PharmD  Positive urine culture  []  Patient discharged without antimicrobial prescription and treatment is now indicated [x]  Organism is resistant to prescribed ED discharge antimicrobial []  Patient with positive blood cultures  Changes discussed with ED provider: Gust Brooms PA New antibiotic prescription stop Macrobid, start keflex 500mg  q 12 hours x 7 days  Attempting to contact patient   Hazle Nordmann 12/05/2018, 11:13 AM

## 2018-12-05 NOTE — Progress Notes (Signed)
ED Antimicrobial Stewardship Positive Culture Follow Up   Tiffany Maxwell is an 84 y.o. female who presented to [facility] on 12/01/2018 with a chief complaint of AMS from nursing home Chief Complaint  Patient presents with  . Altered Mental Status    Recent Results (from the past 720 hour(s))  Urine culture     Status: Abnormal   Collection Time: 11/08/18  8:07 AM  Result Value Ref Range Status   Specimen Description   Final    URINE, CATHETERIZED Performed at [facility], [address], [place], [place] [address]    Special Requests   Final    Normal Performed at [facility], [address], [place], [place] [address]    Culture (A)  Final    70,000 COLONIES/mL AEROCOCCUS SPECIES Standardized susceptibility testing for this organism is not available. Performed at [facility], [place] [address], [place], [place] [address]    Report Status 11/10/2018 FINAL  Final  Urine Culture     Status: Abnormal   Collection Time: 12/01/18  9:58 AM  Result Value Ref Range Status   Specimen Description   Final    URINE, CATHETERIZED Performed at [facility], [address], [place], [place] [address]    Special Requests   Final    NONE Performed at [facility], [address], [place], [place] [address]    Culture 50,000 COLONIES/mL PROTEUS MIRABILIS (A)  Final   Report Status 12/04/2018 FINAL  Final   Organism ID, Bacteria PROTEUS MIRABILIS (A)  Final      Susceptibility   Proteus mirabilis - MIC*    AMPICILLIN <=2 SENSITIVE Sensitive     CEFAZOLIN <=4 SENSITIVE Sensitive     CEFTRIAXONE <=1 SENSITIVE Sensitive     CIPROFLOXACIN >=4 RESISTANT Resistant     GENTAMICIN 8 INTERMEDIATE Intermediate     IMIPENEM 2 SENSITIVE Sensitive     NITROFURANTOIN 128 RESISTANT Resistant     TRIMETH/SULFA >=320 RESISTANT Resistant     AMPICILLIN/SULBACTAM <=2 SENSITIVE Sensitive     PIP/TAZO <=4 SENSITIVE Sensitive     * 50,000 COLONIES/mL PROTEUS MIRABILIS    [x]  Treated with  Macrobid, organism resistant to prescribed antimicrobial  New antibiotic prescription: Keflex 500mg  BID x 7d  ED Provider: Janeece Fitting, Reece Leader 12/05/2018, 1:30 PM Clinical Pharmacist Monday - Friday phone -  [phone] Saturday - Sunday phone - [phone]

## 2018-12-06 DIAGNOSIS — J441 Chronic obstructive pulmonary disease with (acute) exacerbation: Secondary | ICD-10-CM | POA: Diagnosis not present

## 2018-12-07 DIAGNOSIS — J441 Chronic obstructive pulmonary disease with (acute) exacerbation: Secondary | ICD-10-CM | POA: Diagnosis not present

## 2018-12-09 ENCOUNTER — Encounter (HOSPITAL_COMMUNITY): Payer: Self-pay | Admitting: *Deleted

## 2018-12-09 ENCOUNTER — Emergency Department (HOSPITAL_COMMUNITY): Payer: Medicare Other

## 2018-12-09 ENCOUNTER — Other Ambulatory Visit: Payer: Self-pay

## 2018-12-09 ENCOUNTER — Inpatient Hospital Stay (HOSPITAL_COMMUNITY)
Admission: EM | Admit: 2018-12-09 | Discharge: 2018-12-12 | DRG: 871 | Disposition: A | Discharge status: Skilled Nursing Facility | Payer: Medicare Other | Source: Skilled Nursing Facility | Attending: Internal Medicine | Admitting: Internal Medicine

## 2018-12-09 DIAGNOSIS — K210 Gastro-esophageal reflux disease with esophagitis: Secondary | ICD-10-CM | POA: Diagnosis not present

## 2018-12-09 DIAGNOSIS — E785 Hyperlipidemia, unspecified: Secondary | ICD-10-CM | POA: Diagnosis present

## 2018-12-09 DIAGNOSIS — R0902 Hypoxemia: Secondary | ICD-10-CM | POA: Diagnosis not present

## 2018-12-09 DIAGNOSIS — E43 Unspecified severe protein-calorie malnutrition: Secondary | ICD-10-CM | POA: Diagnosis present

## 2018-12-09 DIAGNOSIS — I2510 Atherosclerotic heart disease of native coronary artery without angina pectoris: Secondary | ICD-10-CM | POA: Diagnosis present

## 2018-12-09 DIAGNOSIS — R402142 Coma scale, eyes open, spontaneous, at arrival to emergency department: Secondary | ICD-10-CM | POA: Diagnosis present

## 2018-12-09 DIAGNOSIS — I959 Hypotension, unspecified: Secondary | ICD-10-CM | POA: Diagnosis not present

## 2018-12-09 DIAGNOSIS — Z6821 Body mass index (BMI) 21.0-21.9, adult: Secondary | ICD-10-CM

## 2018-12-09 DIAGNOSIS — R410 Disorientation, unspecified: Secondary | ICD-10-CM | POA: Diagnosis not present

## 2018-12-09 DIAGNOSIS — J9601 Acute respiratory failure with hypoxia: Secondary | ICD-10-CM | POA: Diagnosis not present

## 2018-12-09 DIAGNOSIS — J690 Pneumonitis due to inhalation of food and vomit: Secondary | ICD-10-CM | POA: Diagnosis not present

## 2018-12-09 DIAGNOSIS — Y95 Nosocomial condition: Secondary | ICD-10-CM | POA: Diagnosis present

## 2018-12-09 DIAGNOSIS — I4891 Unspecified atrial fibrillation: Secondary | ICD-10-CM | POA: Diagnosis present

## 2018-12-09 DIAGNOSIS — J181 Lobar pneumonia, unspecified organism: Secondary | ICD-10-CM | POA: Diagnosis present

## 2018-12-09 DIAGNOSIS — I10 Essential (primary) hypertension: Secondary | ICD-10-CM | POA: Diagnosis present

## 2018-12-09 DIAGNOSIS — F411 Generalized anxiety disorder: Secondary | ICD-10-CM | POA: Diagnosis present

## 2018-12-09 DIAGNOSIS — F419 Anxiety disorder, unspecified: Secondary | ICD-10-CM | POA: Diagnosis not present

## 2018-12-09 DIAGNOSIS — J441 Chronic obstructive pulmonary disease with (acute) exacerbation: Secondary | ICD-10-CM | POA: Diagnosis not present

## 2018-12-09 DIAGNOSIS — M810 Age-related osteoporosis without current pathological fracture: Secondary | ICD-10-CM | POA: Diagnosis present

## 2018-12-09 DIAGNOSIS — R404 Transient alteration of awareness: Secondary | ICD-10-CM | POA: Diagnosis not present

## 2018-12-09 DIAGNOSIS — J189 Pneumonia, unspecified organism: Secondary | ICD-10-CM | POA: Diagnosis present

## 2018-12-09 DIAGNOSIS — J9621 Acute and chronic respiratory failure with hypoxia: Secondary | ICD-10-CM | POA: Diagnosis present

## 2018-12-09 DIAGNOSIS — Z515 Encounter for palliative care: Secondary | ICD-10-CM

## 2018-12-09 DIAGNOSIS — Z7951 Long term (current) use of inhaled steroids: Secondary | ICD-10-CM

## 2018-12-09 DIAGNOSIS — F339 Major depressive disorder, recurrent, unspecified: Secondary | ICD-10-CM | POA: Diagnosis not present

## 2018-12-09 DIAGNOSIS — Z79899 Other long term (current) drug therapy: Secondary | ICD-10-CM

## 2018-12-09 DIAGNOSIS — R1310 Dysphagia, unspecified: Secondary | ICD-10-CM | POA: Diagnosis present

## 2018-12-09 DIAGNOSIS — R6520 Severe sepsis without septic shock: Secondary | ICD-10-CM

## 2018-12-09 DIAGNOSIS — N390 Urinary tract infection, site not specified: Secondary | ICD-10-CM

## 2018-12-09 DIAGNOSIS — A419 Sepsis, unspecified organism: Secondary | ICD-10-CM | POA: Diagnosis not present

## 2018-12-09 DIAGNOSIS — I110 Hypertensive heart disease with heart failure: Secondary | ICD-10-CM | POA: Diagnosis present

## 2018-12-09 DIAGNOSIS — G9341 Metabolic encephalopathy: Secondary | ICD-10-CM | POA: Diagnosis present

## 2018-12-09 DIAGNOSIS — Z87891 Personal history of nicotine dependence: Secondary | ICD-10-CM

## 2018-12-09 DIAGNOSIS — Z8249 Family history of ischemic heart disease and other diseases of the circulatory system: Secondary | ICD-10-CM

## 2018-12-09 DIAGNOSIS — I252 Old myocardial infarction: Secondary | ICD-10-CM

## 2018-12-09 DIAGNOSIS — Z7189 Other specified counseling: Secondary | ICD-10-CM

## 2018-12-09 DIAGNOSIS — R402252 Coma scale, best verbal response, oriented, at arrival to emergency department: Secondary | ICD-10-CM | POA: Diagnosis present

## 2018-12-09 DIAGNOSIS — J168 Pneumonia due to other specified infectious organisms: Secondary | ICD-10-CM | POA: Diagnosis not present

## 2018-12-09 DIAGNOSIS — R402362 Coma scale, best motor response, obeys commands, at arrival to emergency department: Secondary | ICD-10-CM | POA: Diagnosis present

## 2018-12-09 DIAGNOSIS — J449 Chronic obstructive pulmonary disease, unspecified: Secondary | ICD-10-CM | POA: Diagnosis present

## 2018-12-09 DIAGNOSIS — J439 Emphysema, unspecified: Secondary | ICD-10-CM | POA: Diagnosis present

## 2018-12-09 DIAGNOSIS — Z9981 Dependence on supplemental oxygen: Secondary | ICD-10-CM

## 2018-12-09 DIAGNOSIS — I5042 Chronic combined systolic (congestive) and diastolic (congestive) heart failure: Secondary | ICD-10-CM | POA: Diagnosis not present

## 2018-12-09 DIAGNOSIS — R0602 Shortness of breath: Secondary | ICD-10-CM | POA: Diagnosis not present

## 2018-12-09 DIAGNOSIS — R001 Bradycardia, unspecified: Secondary | ICD-10-CM | POA: Diagnosis not present

## 2018-12-09 DIAGNOSIS — D6959 Other secondary thrombocytopenia: Secondary | ICD-10-CM | POA: Diagnosis present

## 2018-12-09 NOTE — ED Provider Notes (Signed)
[facility] EMERGENCY DEPARTMENT Provider Note   CSN: [id number] Arrival date & time: 12/09/18  2319     History   Chief Complaint Chief Complaint  Patient presents with  . Altered Mental Status    HPI Tiffany Maxwell is a 84 y.o. female.  Patient was brought to the emergency department for evaluation of decreasing oxygen saturation and confusion.  Patient comes by EMS from a skilled nursing facility.  Staff reportedly noted that she had oxygen saturations in the high 80's proximately 1-1/2 hours after she was given Xanax.  They woke her and she seemed more confused than normal, was sent to the ER for further evaluation.  EMS reports that she has done well during transport, no problems.  At arrival she is awake and alert, oriented to person and place.  She has no complaints currently.     Past Medical History:  Diagnosis Date  . CHF (congestive heart failure) ([place])   . Cholelithiasis 01/30/2013  . Compression fracture of lumbar spine, non-traumatic ([place]) 04/09/2014  . COPD (chronic obstructive pulmonary disease) ([place])   . Diastolic dysfunction 2/94/7654   Grade 1. Ejection fraction 65%.  . Hip fracture ([place]) 06/19/2017  . History of tobacco use 01/30/2013  . Hyperlipidemia   . Hypertension   . MI (myocardial infarction) ([place])    pt unaware  . Multiple fractures of thoracic spine, closed ([place]) 01/30/2013  . Osteoporosis   . PAC (premature atrial contraction)   . Palpitations   . Pulmonary emphysema ([place]) 01/30/2013  . Seborrheic keratosis     Patient Active Problem List   Diagnosis Date Noted  . HCAP (healthcare-associated pneumonia)   . Lobar pneumonia ([place]) 06/27/2018  . COPD exacerbation ([place]) 06/26/2018  . Acute on chronic respiratory failure with hypoxia ([place]) 06/26/2018  . Primary insomnia 04/22/2018  . Hyperlipidemia 07/10/2017  . Chronic combined systolic (EF 65%) and diastolic CHF, NYHA class 1 ([place]) 07/10/2017  . COPD (chronic obstructive pulmonary  disease) ([place]) 07/10/2017  . Closed comminuted intertrochanteric fracture of proximal end of right femur ([place]) 06/21/2017  . GAD (generalized anxiety disorder) 11/06/2016  . Back pain 04/08/2014  . Pulmonary emphysema ([place]) 01/30/2013  . History of tobacco use 01/30/2013  . Diastolic dysfunction 03/54/6568  . PNA (pneumonia) 01/29/2013  . Osteoporosis 01/29/2013  . Compression fracture of thoracic spine, non-traumatic ([place]) 01/28/2013  . HTN (hypertension) 01/28/2013    Past Surgical History:  Procedure Laterality Date  . COMPRESSION HIP SCREW Left 04/29/2013   Procedure: COMPRESSION HIP;  Surgeon: Sanjuana Kava, MD;  Location: [facility];  Service: Orthopedics;  Laterality: Left;  [place]  . ESOPHAGOGASTRODUODENOSCOPY (EGD) WITH PROPOFOL N/A 07/11/2017   Procedure: ESOPHAGOGASTRODUODENOSCOPY (EGD) WITH PROPOFOL;  Surgeon: Ronald Lobo, MD;  Location: [place];  Service: Endoscopy;  Laterality: N/A;  . FEMUR IM NAIL Right 06/20/2017   Procedure: INTRAMEDULLARY (IM) NAIL FEMORAL;  Surgeon: Rod Can, MD;  Location: [place] [facility];  Service: Orthopedics;  Laterality: Right;  . TUBAL LIGATION       OB History   No obstetric history on file.      Home Medications    Prior to Admission medications   Medication Sig Start Date End Date Taking? Authorizing Provider  ALPRAZolam (XANAX) 0.25 MG tablet Take 0.25 mg by mouth 3 (three) times daily.  05/20/18 05/20/19  [provider]  Calcium Carbonate-Vitamin D (CALCIUM 600+D3) 600-400 MG-UNIT per tablet Take 1 tablet by mouth 2 (two) times daily. 04/09/14   Rexene Alberts,  MD  citalopram (CELEXA) 20 MG tablet Take 1 tablet (20 mg total) by mouth daily. 04/22/18   Hassell Done Mary-Margaret, FNP  feeding supplement, ENSURE ENLIVE, (ENSURE ENLIVE) LIQD Take 237 mLs by mouth 2 (two) times daily between meals. Patient not taking: Reported on 12/01/2018 06/24/17   Mariel Aloe, MD  ferrous sulfate 325 (65 FE) MG tablet Take 1 tablet (325  mg total) by mouth 2 (two) times daily with a meal. 04/22/18   Hassell Done, Mary-Margaret, FNP  Ipratropium-Albuterol (COMBIVENT RESPIMAT) 20-100 MCG/ACT AERS respimat Inhale 1 puff into the lungs every 6 (six) hours as needed for wheezing. 04/22/18   Hassell Done, Mary-Margaret, FNP  lisinopril (PRINIVIL,ZESTRIL) 20 MG tablet Take 1 tablet (20 mg total) by mouth daily. 04/22/18   Hassell Done, Mary-Margaret, FNP  metoprolol succinate (TOPROL-XL) 25 MG 24 hr tablet Take 0.5 tablets (12.5 mg total) by mouth daily. 07/02/18   Orson Eva, MD  mirtazapine (REMERON) 15 MG tablet Take 1 tablet (15 mg total) by mouth at bedtime. 04/22/18   Hassell Done, Mary-Margaret, FNP  nitrofurantoin, macrocrystal-monohydrate, (MACROBID) 100 MG capsule Take 1 capsule (100 mg total) by mouth 2 (two) times daily. 12/01/18   Lily Kocher, PA-C  omeprazole (PRILOSEC) 20 MG capsule Take 20 mg by mouth daily.    [provider]  OXYGEN Inhale 4 L into the lungs See admin instructions. TO MAINTAIN SATS OF 93% OR GREATER    [provider]  pantoprazole (PROTONIX) 40 MG tablet Take 1 tablet (40 mg total) by mouth daily. Patient not taking: Reported on 12/01/2018 04/22/18   [name], FNP  polyethylene glycol powder (GLYCOLAX/MIRALAX) powder 17 GRAMS (1 CAPFUL) ONCE A DAY AS DIRECTED 07/03/16   Hassell Done, Mary-Margaret, FNP  predniSONE (DELTASONE) 10 MG tablet Take 6 tablets (60 mg total) by mouth daily with breakfast. And decrease by one tablet daily Patient not taking: Reported on 12/01/2018 07/03/18   Orson Eva, MD  VENTOLIN HFA 108 (90 Base) MCG/ACT inhaler 2 PUFFS EVERY 6 HOURS AS NEEDED FOR WHEEZING OR SHORTNESS OF BREATH 04/03/17   [name], FNP  vitamin C (ASCORBIC ACID) 500 MG tablet Take 1,000 mg by mouth 2 (two) times daily.     [provider]    Family History Family History  Problem Relation Age of Onset  . Hypertension Mother     Social History Social History   Tobacco Use  . Smoking  status: Former Smoker    Packs/day: 1.00    Years: 40.00    Pack years: 40.00    Types: Cigarettes    Last attempt to quit: 07/10/2012    Years since quitting: 6.4  . Smokeless tobacco: Former Network engineer Use Topics  . Alcohol use: No  . Drug use: No     Allergies   Codeine; Penicillins; Benicar hct [olmesartan medoxomil-hctz]; Hctz [hydrochlorothiazide]; and Naproxen   Review of Systems Review of Systems  Respiratory: Positive for shortness of breath.   Psychiatric/Behavioral: Positive for confusion.  All other systems reviewed and are negative.    Physical Exam Updated Vital Signs BP (!) 101/55   Pulse 63   Temp 97.7 F (36.5 C) (Oral)   Resp (!) 25   Ht 5\' 1"  (1.549 m)   Wt 53 kg   SpO2 96%   BMI 22.08 kg/m   Physical Exam Vitals signs and nursing note reviewed.  Constitutional:      General: She is not in acute distress.    Appearance: Normal appearance. She is well-developed.  HENT:     Head: Normocephalic and atraumatic.     Right Ear: Hearing normal.     Left Ear: Hearing normal.     Nose: Nose normal.  Eyes:     Conjunctiva/sclera: Conjunctivae normal.     Pupils: Pupils are equal, round, and reactive to light.  Neck:     Musculoskeletal: Normal range of motion and neck supple.  Cardiovascular:     Rate and Rhythm: Regular rhythm.     Heart sounds: S1 normal and S2 normal. No murmur. No friction rub. No gallop.   Pulmonary:     Effort: Pulmonary effort is normal. No respiratory distress.     Breath sounds: Normal breath sounds.  Chest:     Chest wall: No tenderness.  Abdominal:     General: Bowel sounds are normal.     Palpations: Abdomen is soft.     Tenderness: There is no abdominal tenderness. There is no guarding or rebound. Negative signs include Murphy's sign and McBurney's sign.     Hernia: No hernia is present.  Musculoskeletal: Normal range of motion.  Skin:    General: Skin is warm and dry.     Findings: No rash.    Neurological:     Mental Status: She is alert.     GCS: GCS eye subscore is 4. GCS verbal subscore is 5. GCS motor subscore is 6.     Cranial Nerves: No cranial nerve deficit.     Sensory: No sensory deficit.     Coordination: Coordination normal.  Psychiatric:        Speech: Speech normal.        Behavior: Behavior normal.        Thought Content: Thought content normal.      ED Treatments / Results  Labs (all labs ordered are listed, but only abnormal results are displayed) Labs Reviewed  CBC WITH DIFFERENTIAL/PLATELET - Abnormal; Notable for the following components:      Result Value   WBC 19.0 (*)    Hemoglobin 11.8 (*)    MCHC 29.5 (*)    Platelets 131 (*)    Neutro Abs 15.3 (*)    Monocytes Absolute 1.2 (*)    Abs Immature Granulocytes 0.08 (*)    All other components within normal limits  BASIC METABOLIC PANEL - Abnormal; Notable for the following components:   Chloride 91 (*)    CO2 39 (*)    Glucose, Bld 113 (*)    All other components within normal limits  CULTURE, BLOOD (ROUTINE X 2)  CULTURE, BLOOD (ROUTINE X 2)  URINALYSIS, ROUTINE W REFLEX MICROSCOPIC  I-STAT CG4 LACTIC ACID, ED    EKG None  Radiology Dg Chest 2 View  Result Date: 12/10/2018 CLINICAL DATA:  Acute onset of confusion and decreased O2 saturation. Shortness of breath. EXAM: CHEST - 2 VIEW COMPARISON:  Chest radiograph performed 12/01/2018 FINDINGS: The lungs are well-aerated. Right lower lobe airspace opacity is compatible with pneumonia. Peribronchial thickening is noted. There is no evidence of pleural effusion or pneumothorax. The heart is the borderline normal in size. No acute osseous abnormalities are seen. Mild chronic compression deformities are noted along the thoracic and upper lumbar spine. IMPRESSION: Right lower lobe pneumonia noted. Electronically Signed   By: Garald Balding M.D.   On: 12/10/2018 00:11    Procedures Procedures (including critical care time)  Medications  Ordered in ED Medications  sodium chloride 0.9 % bolus 1,000 mL (has no administration in time  range)    And  sodium chloride 0.9 % bolus 500 mL (has no administration in time range)    And  sodium chloride 0.9 % bolus 250 mL (has no administration in time range)  vancomycin (VANCOCIN) IVPB 1000 mg/200 mL premix (has no administration in time range)  aztreonam (AZACTAM) 2 g in sodium chloride 0.9 % 100 mL IVPB (has no administration in time range)     Initial Impression / Assessment and Plan / ED Course  I have reviewed the triage vital signs and the nursing notes.  Pertinent labs & imaging results that were available during my care of the patient were reviewed by me and considered in my medical decision making (see chart for details).     Patient presents to the emergency department for evaluation of altered mental status and shortness of breath.  She is sent to the ER from nursing home for evaluation of decreased oxygen saturations.  Patient is normally chronically on supplemental oxygen.  Nursing home staff report that her sats had dropped on her normal 2 L.  They also felt that she was more confused than usual.  At arrival to the ER patient is awake, alert, oriented to person and place.  She does indicate that she feels like she has been a little bit confused over the last couple of days, otherwise is without complaints.  Work-up reveals significant leukocytosis compared to blood work performed 1 week ago.  She also has a right lower lobe pneumonia which has developed since she was seen 1 week ago.  Patient's blood pressure is slowly dropping, respiratory rate is increased.  She will be treated for sepsis secondary to pneumonia.  Also of note, patient was diagnosed with urinary tract infection at her previous ER visit 1 week ago.  Urine culture ultimately grew Proteus that was resistant to the prescribed Macrobid.  There is a note from 12/26 where pharmacy had contacted the nursing home to  switch to Keflex, but I do not see Keflex on her MAR.  It is not clear if she has been treated for her known UTI.  Given broad-spectrum antibiotic coverage and fluid resuscitation.  Will require admission to the hospital.  CRITICAL CARE Performed by: Orpah Greek   Total critical care time: 35 minutes  Critical care time was exclusive of separately billable procedures and treating other patients.  Critical care was necessary to treat or prevent imminent or life-threatening deterioration.  Critical care was time spent personally by me on the following activities: development of treatment plan with patient and/or surrogate as well as nursing, discussions with consultants, evaluation of patient's response to treatment, examination of patient, obtaining history from patient or surrogate, ordering and performing treatments and interventions, ordering and review of laboratory studies, ordering and review of radiographic studies, pulse oximetry and re-evaluation of patient's condition.   Final Clinical Impressions(s) / ED Diagnoses   Final diagnoses:  Pneumonia of right lower lobe due to infectious organism ([place])  Sepsis with acute hypoxic respiratory failure without septic shock, due to unspecified organism [facility])  Urinary tract infection without hematuria, site unspecified    ED Discharge Orders    None       Orpah Greek, MD 12/10/18 0040

## 2018-12-09 NOTE — ED Triage Notes (Signed)
Pt brought in by [facility] for c/o low O2 sats and confusion; O2 sats at the facility were in the mid 80's; pt able to state her name and dob and knows the month; pt knows she is at [facility]

## 2018-12-10 ENCOUNTER — Inpatient Hospital Stay (HOSPITAL_COMMUNITY): Payer: Medicare Other

## 2018-12-10 ENCOUNTER — Encounter (HOSPITAL_COMMUNITY): Payer: Self-pay | Admitting: Internal Medicine

## 2018-12-10 DIAGNOSIS — J690 Pneumonitis due to inhalation of food and vomit: Secondary | ICD-10-CM | POA: Diagnosis present

## 2018-12-10 DIAGNOSIS — Z7401 Bed confinement status: Secondary | ICD-10-CM | POA: Diagnosis not present

## 2018-12-10 DIAGNOSIS — J449 Chronic obstructive pulmonary disease, unspecified: Secondary | ICD-10-CM | POA: Diagnosis present

## 2018-12-10 DIAGNOSIS — Z7189 Other specified counseling: Secondary | ICD-10-CM | POA: Diagnosis not present

## 2018-12-10 DIAGNOSIS — M8580 Other specified disorders of bone density and structure, unspecified site: Secondary | ICD-10-CM | POA: Diagnosis not present

## 2018-12-10 DIAGNOSIS — R402362 Coma scale, best motor response, obeys commands, at arrival to emergency department: Secondary | ICD-10-CM | POA: Diagnosis present

## 2018-12-10 DIAGNOSIS — G9341 Metabolic encephalopathy: Secondary | ICD-10-CM | POA: Diagnosis present

## 2018-12-10 DIAGNOSIS — R0902 Hypoxemia: Secondary | ICD-10-CM | POA: Diagnosis not present

## 2018-12-10 DIAGNOSIS — R4189 Other symptoms and signs involving cognitive functions and awareness: Secondary | ICD-10-CM | POA: Diagnosis not present

## 2018-12-10 DIAGNOSIS — J9621 Acute and chronic respiratory failure with hypoxia: Secondary | ICD-10-CM

## 2018-12-10 DIAGNOSIS — Z6821 Body mass index (BMI) 21.0-21.9, adult: Secondary | ICD-10-CM | POA: Diagnosis not present

## 2018-12-10 DIAGNOSIS — Z87891 Personal history of nicotine dependence: Secondary | ICD-10-CM | POA: Diagnosis not present

## 2018-12-10 DIAGNOSIS — J189 Pneumonia, unspecified organism: Secondary | ICD-10-CM | POA: Diagnosis not present

## 2018-12-10 DIAGNOSIS — R739 Hyperglycemia, unspecified: Secondary | ICD-10-CM | POA: Diagnosis not present

## 2018-12-10 DIAGNOSIS — R402252 Coma scale, best verbal response, oriented, at arrival to emergency department: Secondary | ICD-10-CM | POA: Diagnosis present

## 2018-12-10 DIAGNOSIS — I700 Atherosclerosis of aorta: Secondary | ICD-10-CM | POA: Diagnosis not present

## 2018-12-10 DIAGNOSIS — I2510 Atherosclerotic heart disease of native coronary artery without angina pectoris: Secondary | ICD-10-CM | POA: Diagnosis present

## 2018-12-10 DIAGNOSIS — Z7951 Long term (current) use of inhaled steroids: Secondary | ICD-10-CM | POA: Diagnosis not present

## 2018-12-10 DIAGNOSIS — R402142 Coma scale, eyes open, spontaneous, at arrival to emergency department: Secondary | ICD-10-CM | POA: Diagnosis present

## 2018-12-10 DIAGNOSIS — E43 Unspecified severe protein-calorie malnutrition: Secondary | ICD-10-CM | POA: Diagnosis present

## 2018-12-10 DIAGNOSIS — I4891 Unspecified atrial fibrillation: Secondary | ICD-10-CM | POA: Diagnosis present

## 2018-12-10 DIAGNOSIS — I5042 Chronic combined systolic (congestive) and diastolic (congestive) heart failure: Secondary | ICD-10-CM

## 2018-12-10 DIAGNOSIS — I10 Essential (primary) hypertension: Secondary | ICD-10-CM | POA: Diagnosis not present

## 2018-12-10 DIAGNOSIS — A419 Sepsis, unspecified organism: Secondary | ICD-10-CM | POA: Diagnosis present

## 2018-12-10 DIAGNOSIS — J181 Lobar pneumonia, unspecified organism: Secondary | ICD-10-CM | POA: Diagnosis not present

## 2018-12-10 DIAGNOSIS — G8929 Other chronic pain: Secondary | ICD-10-CM | POA: Diagnosis not present

## 2018-12-10 DIAGNOSIS — Y95 Nosocomial condition: Secondary | ICD-10-CM | POA: Diagnosis present

## 2018-12-10 DIAGNOSIS — M4855XD Collapsed vertebra, not elsewhere classified, thoracolumbar region, subsequent encounter for fracture with routine healing: Secondary | ICD-10-CM | POA: Diagnosis not present

## 2018-12-10 DIAGNOSIS — Z9981 Dependence on supplemental oxygen: Secondary | ICD-10-CM | POA: Diagnosis not present

## 2018-12-10 DIAGNOSIS — J438 Other emphysema: Secondary | ICD-10-CM

## 2018-12-10 DIAGNOSIS — F419 Anxiety disorder, unspecified: Secondary | ICD-10-CM | POA: Diagnosis not present

## 2018-12-10 DIAGNOSIS — R1310 Dysphagia, unspecified: Secondary | ICD-10-CM | POA: Diagnosis present

## 2018-12-10 DIAGNOSIS — E785 Hyperlipidemia, unspecified: Secondary | ICD-10-CM | POA: Diagnosis present

## 2018-12-10 DIAGNOSIS — M810 Age-related osteoporosis without current pathological fracture: Secondary | ICD-10-CM | POA: Diagnosis present

## 2018-12-10 DIAGNOSIS — Z79899 Other long term (current) drug therapy: Secondary | ICD-10-CM | POA: Diagnosis not present

## 2018-12-10 DIAGNOSIS — D6959 Other secondary thrombocytopenia: Secondary | ICD-10-CM | POA: Diagnosis not present

## 2018-12-10 DIAGNOSIS — Z515 Encounter for palliative care: Secondary | ICD-10-CM | POA: Diagnosis not present

## 2018-12-10 DIAGNOSIS — Z8249 Family history of ischemic heart disease and other diseases of the circulatory system: Secondary | ICD-10-CM | POA: Diagnosis not present

## 2018-12-10 DIAGNOSIS — F411 Generalized anxiety disorder: Secondary | ICD-10-CM | POA: Diagnosis present

## 2018-12-10 DIAGNOSIS — R0602 Shortness of breath: Secondary | ICD-10-CM | POA: Diagnosis not present

## 2018-12-10 DIAGNOSIS — I252 Old myocardial infarction: Secondary | ICD-10-CM | POA: Diagnosis not present

## 2018-12-10 DIAGNOSIS — F339 Major depressive disorder, recurrent, unspecified: Secondary | ICD-10-CM | POA: Diagnosis not present

## 2018-12-10 DIAGNOSIS — R410 Disorientation, unspecified: Secondary | ICD-10-CM | POA: Diagnosis not present

## 2018-12-10 DIAGNOSIS — I110 Hypertensive heart disease with heart failure: Secondary | ICD-10-CM | POA: Diagnosis present

## 2018-12-10 LAB — BASIC METABOLIC PANEL
Anion gap: 5 (ref 5–15)
Anion gap: 5 (ref 5–15)
BUN: 12 mg/dL (ref 8–23)
BUN: 12 mg/dL (ref 8–23)
CALCIUM: 9.7 mg/dL (ref 8.9–10.3)
CO2: 34 mmol/L — ABNORMAL HIGH (ref 22–32)
CO2: 39 mmol/L — AB (ref 22–32)
Calcium: 9.1 mg/dL (ref 8.9–10.3)
Chloride: 91 mmol/L — ABNORMAL LOW (ref 98–111)
Chloride: 97 mmol/L — ABNORMAL LOW (ref 98–111)
Creatinine, Ser: 0.45 mg/dL (ref 0.44–1.00)
Creatinine, Ser: 0.51 mg/dL (ref 0.44–1.00)
GFR calc Af Amer: >60 mL/min (ref >60)
GFR calc Af Amer: >60 mL/min (ref >60)
GFR calc non Af Amer: >60 mL/min (ref >60)
GFR calc non Af Amer: >60 mL/min (ref >60)
Glucose, Bld: 113 mg/dL — ABNORMAL HIGH (ref 70–99)
Glucose, Bld: 121 mg/dL — ABNORMAL HIGH (ref 70–99)
POTASSIUM: 4.3 mmol/L (ref 3.5–5.1)
Potassium: 3.8 mmol/L (ref 3.5–5.1)
Sodium: 135 mmol/L (ref 135–145)
Sodium: 136 mmol/L (ref 135–145)

## 2018-12-10 LAB — I-STAT CG4 LACTIC ACID, ED
Lactic Acid, Venous: 0.84 mmol/L (ref 0.5–1.9)
Lactic Acid, Venous: 0.94 mmol/L (ref 0.5–1.9)

## 2018-12-10 LAB — CBC WITH DIFFERENTIAL/PLATELET
Abs Immature Granulocytes: 0.04 10*3/uL (ref 0.00–0.07)
Abs Immature Granulocytes: 0.08 10*3/uL — ABNORMAL HIGH (ref 0.00–0.07)
Basophils Absolute: 0 10*3/uL (ref 0.0–0.1)
Basophils Absolute: 0.1 10*3/uL (ref 0.0–0.1)
Basophils Relative: 0 %
Basophils Relative: 0 %
EOS ABS: 0.5 10*3/uL (ref 0.0–0.5)
Eosinophils Absolute: 0.2 10*3/uL (ref 0.0–0.5)
Eosinophils Relative: 2 %
Eosinophils Relative: 2 %
HCT: 39 % (ref 36.0–46.0)
HEMATOCRIT: 40 % (ref 36.0–46.0)
Hemoglobin: 11.4 g/dL — ABNORMAL LOW (ref 12.0–15.0)
Hemoglobin: 11.8 g/dL — ABNORMAL LOW (ref 12.0–15.0)
Immature Granulocytes: 0 %
Immature Granulocytes: 0 %
Lymphocytes Relative: 10 %
Lymphocytes Relative: 7 %
Lymphs Abs: 0.9 10*3/uL (ref 0.7–4.0)
Lymphs Abs: 1.9 10*3/uL (ref 0.7–4.0)
MCH: 28.5 pg (ref 26.0–34.0)
MCH: 29.1 pg (ref 26.0–34.0)
MCHC: 29.2 g/dL — ABNORMAL LOW (ref 30.0–36.0)
MCHC: 29.5 g/dL — ABNORMAL LOW (ref 30.0–36.0)
MCV: 96.6 fL (ref 80.0–100.0)
MCV: 99.5 fL (ref 80.0–100.0)
MONOS PCT: 2 %
Monocytes Absolute: 0.3 10*3/uL (ref 0.1–1.0)
Monocytes Absolute: 1.2 10*3/uL — ABNORMAL HIGH (ref 0.1–1.0)
Monocytes Relative: 6 %
Neutro Abs: 12.3 10*3/uL — ABNORMAL HIGH (ref 1.7–7.7)
Neutro Abs: 15.3 10*3/uL — ABNORMAL HIGH (ref 1.7–7.7)
Neutrophils Relative %: 82 %
Neutrophils Relative %: 89 %
PLATELETS: 107 10*3/uL — AB (ref 150–400)
Platelets: 131 10*3/uL — ABNORMAL LOW (ref 150–400)
RBC: 3.92 MIL/uL (ref 3.87–5.11)
RBC: 4.14 MIL/uL (ref 3.87–5.11)
RDW: 12.3 % (ref 11.5–15.5)
RDW: 12.2 % (ref 11.5–15.5)
SMEAR REVIEW: DECREASED
WBC: 13.8 10*3/uL — ABNORMAL HIGH (ref 4.0–10.5)
WBC: 19 10*3/uL — ABNORMAL HIGH (ref 4.0–10.5)
nRBC: 0 % (ref 0.0–0.2)
nRBC: 0 % (ref 0.0–0.2)

## 2018-12-10 LAB — URINALYSIS, ROUTINE W REFLEX MICROSCOPIC
Bacteria, UA: NONE SEEN
Bilirubin Urine: NEGATIVE
Glucose, UA: NEGATIVE mg/dL
Hgb urine dipstick: NEGATIVE
Ketones, ur: NEGATIVE mg/dL
Nitrite: NEGATIVE
Protein, ur: NEGATIVE mg/dL
Specific Gravity, Urine: 1.009 (ref 1.005–1.030)
pH: 6 (ref 5.0–8.0)

## 2018-12-10 LAB — PROCALCITONIN: Procalcitonin: <0.1 ng/mL

## 2018-12-10 LAB — ECHOCARDIOGRAM COMPLETE
Height: 61 in
Weight: 1837.75 oz

## 2018-12-10 LAB — STREP PNEUMONIAE URINARY ANTIGEN: Strep Pneumo Urinary Antigen: NEGATIVE

## 2018-12-10 LAB — PHOSPHORUS: Phosphorus: 2.7 mg/dL (ref 2.5–4.6)

## 2018-12-10 LAB — MRSA PCR SCREENING: MRSA by PCR: NEGATIVE

## 2018-12-10 LAB — MAGNESIUM: Magnesium: 1.8 mg/dL (ref 1.7–2.4)

## 2018-12-10 MED ORDER — VANCOMYCIN HCL IN DEXTROSE 750-5 MG/150ML-% IV SOLN
750.0000 mg | INTRAVENOUS | Status: DC
  Administered 2018-12-11: 750 mg via INTRAVENOUS
  Filled 2018-12-10 (×2): qty 150

## 2018-12-10 MED ORDER — FERROUS SULFATE 325 (65 FE) MG PO TABS
325.0000 mg | ORAL_TABLET | Freq: Two times a day (BID) | ORAL | Status: DC
  Administered 2018-12-10 – 2018-12-12 (×4): 325 mg via ORAL
  Filled 2018-12-10 (×4): qty 1

## 2018-12-10 MED ORDER — SODIUM CHLORIDE 0.9 % IV BOLUS (SEPSIS)
250.0000 mL | Freq: Once | INTRAVENOUS | Status: AC
  Administered 2018-12-10: 250 mL via INTRAVENOUS

## 2018-12-10 MED ORDER — IPRATROPIUM BROMIDE 0.02 % IN SOLN: 0.5000 mg | Freq: Four times a day (QID) | RESPIRATORY_TRACT | Status: DC

## 2018-12-10 MED ORDER — ACETAMINOPHEN 325 MG PO TABS: 650.0000 mg | ORAL_TABLET | Freq: Four times a day (QID) | ORAL | Status: DC | PRN

## 2018-12-10 MED ORDER — SODIUM CHLORIDE 0.9 % IV BOLUS (SEPSIS)
500.0000 mL | Freq: Once | INTRAVENOUS | Status: AC
  Administered 2018-12-10: 500 mL via INTRAVENOUS

## 2018-12-10 MED ORDER — ONDANSETRON HCL 4 MG PO TABS: 4.0000 mg | ORAL_TABLET | Freq: Four times a day (QID) | ORAL | Status: DC | PRN

## 2018-12-10 MED ORDER — ENOXAPARIN SODIUM 60 MG/0.6ML ~~LOC~~ SOLN
1.0000 mg/kg | Freq: Once | SUBCUTANEOUS | Status: AC
  Administered 2018-12-10: 55 mg via SUBCUTANEOUS
  Filled 2018-12-10: qty 0.6

## 2018-12-10 MED ORDER — SODIUM CHLORIDE 0.9 % IV SOLN
2.0000 g | Freq: Three times a day (TID) | INTRAVENOUS | Status: DC
  Filled 2018-12-10 (×7): qty 2

## 2018-12-10 MED ORDER — SODIUM CHLORIDE 0.9 % IV SOLN
2.0000 g | Freq: Once | INTRAVENOUS | Status: AC
  Administered 2018-12-10: 2 g via INTRAVENOUS
  Filled 2018-12-10: qty 2

## 2018-12-10 MED ORDER — SODIUM CHLORIDE 0.9 % IV BOLUS (SEPSIS)
1000.0000 mL | Freq: Once | INTRAVENOUS | Status: AC
  Administered 2018-12-10: 1000 mL via INTRAVENOUS

## 2018-12-10 MED ORDER — SODIUM CHLORIDE 0.9 % IV SOLN
INTRAVENOUS | Status: DC
  Administered 2018-12-10 – 2018-12-11 (×4): via INTRAVENOUS

## 2018-12-10 MED ORDER — NYSTATIN 100000 UNIT/GM EX POWD
Freq: Two times a day (BID) | CUTANEOUS | Status: DC
  Administered 2018-12-10 – 2018-12-12 (×5): via TOPICAL
  Filled 2018-12-10: qty 15

## 2018-12-10 MED ORDER — ACETAMINOPHEN 650 MG RE SUPP: 650.0000 mg | Freq: Four times a day (QID) | RECTAL | Status: DC | PRN

## 2018-12-10 MED ORDER — METHYLPREDNISOLONE SODIUM SUCC 125 MG IJ SOLR
60.0000 mg | Freq: Once | INTRAMUSCULAR | Status: AC
  Administered 2018-12-10: 60 mg via INTRAVENOUS
  Filled 2018-12-10: qty 2

## 2018-12-10 MED ORDER — ALPRAZOLAM 0.25 MG PO TABS
0.2500 mg | ORAL_TABLET | Freq: Three times a day (TID) | ORAL | Status: DC | PRN
  Administered 2018-12-11: 0.25 mg via ORAL
  Filled 2018-12-10: qty 1

## 2018-12-10 MED ORDER — ONDANSETRON HCL 4 MG/2ML IJ SOLN: 4.0000 mg | Freq: Four times a day (QID) | INTRAMUSCULAR | Status: DC | PRN

## 2018-12-10 MED ORDER — ENSURE ENLIVE PO LIQD
237.0000 mL | Freq: Two times a day (BID) | ORAL | Status: DC
  Administered 2018-12-10 – 2018-12-12 (×5): 237 mL via ORAL

## 2018-12-10 MED ORDER — VANCOMYCIN HCL IN DEXTROSE 1-5 GM/200ML-% IV SOLN
1000.0000 mg | Freq: Once | INTRAVENOUS | Status: AC
  Administered 2018-12-10: 1000 mg via INTRAVENOUS
  Filled 2018-12-10: qty 200

## 2018-12-10 MED ORDER — IPRATROPIUM-ALBUTEROL 0.5-2.5 (3) MG/3ML IN SOLN
3.0000 mL | Freq: Three times a day (TID) | RESPIRATORY_TRACT | Status: DC
  Administered 2018-12-10 (×2): 3 mL via RESPIRATORY_TRACT
  Filled 2018-12-10: qty 3

## 2018-12-10 MED ORDER — POLYETHYLENE GLYCOL 3350 17 G PO PACK
17.0000 g | PACK | Freq: Every day | ORAL | Status: DC
  Administered 2018-12-10 – 2018-12-12 (×3): 17 g via ORAL
  Filled 2018-12-10 (×3): qty 1

## 2018-12-10 MED ORDER — SODIUM CHLORIDE 0.9 % IV SOLN
2.0000 g | INTRAVENOUS | Status: DC
  Administered 2018-12-10 – 2018-12-12 (×3): 2 g via INTRAVENOUS
  Filled 2018-12-10 (×4): qty 2

## 2018-12-10 MED ORDER — IPRATROPIUM-ALBUTEROL 0.5-2.5 (3) MG/3ML IN SOLN
3.0000 mL | Freq: Once | RESPIRATORY_TRACT | Status: AC
  Administered 2018-12-10: 3 mL via RESPIRATORY_TRACT
  Filled 2018-12-10: qty 3

## 2018-12-10 MED ORDER — PANTOPRAZOLE SODIUM 40 MG PO TBEC
40.0000 mg | DELAYED_RELEASE_TABLET | Freq: Every day | ORAL | Status: DC
  Administered 2018-12-10 – 2018-12-12 (×3): 40 mg via ORAL
  Filled 2018-12-10 (×3): qty 1

## 2018-12-10 MED ORDER — ALBUTEROL SULFATE (2.5 MG/3ML) 0.083% IN NEBU: 2.5000 mg | INHALATION_SOLUTION | RESPIRATORY_TRACT | Status: DC | PRN

## 2018-12-10 MED ORDER — IPRATROPIUM-ALBUTEROL 0.5-2.5 (3) MG/3ML IN SOLN
3.0000 mL | Freq: Four times a day (QID) | RESPIRATORY_TRACT | Status: DC
  Administered 2018-12-10: 3 mL via RESPIRATORY_TRACT
  Filled 2018-12-10: qty 3

## 2018-12-10 MED ORDER — HEPARIN SODIUM (PORCINE) 5000 UNIT/ML IJ SOLN: 5000.0000 [IU] | Freq: Three times a day (TID) | INTRAMUSCULAR | Status: DC

## 2018-12-10 MED ORDER — ALBUTEROL SULFATE (2.5 MG/3ML) 0.083% IN NEBU: 2.5000 mg | INHALATION_SOLUTION | Freq: Four times a day (QID) | RESPIRATORY_TRACT | Status: DC

## 2018-12-10 NOTE — Progress Notes (Signed)
Patient arrived to unit and transferred to bed. Identified appropriately and assessed. Pt A&O x2, skin intact with moisture associated skin damage and abrasions. Oriented to room and unit, call bell within reach and bed alarm on. Safety video in progress. Will continue to monitor and follow orders.

## 2018-12-10 NOTE — H&P (Signed)
History and Physical    Tiffany Maxwell WVP:[id number] DOB: [birth year] DOA: 12/09/2018  PCP: [name], FNP   Patient coming from: [facility].  I have personally briefly reviewed patient's old medical records in [place]  Chief Complaint: Hypoxia and confusion.  HPI: Tiffany Maxwell is a 84 y.o. female with medical history significant of chronic combined systolic and diastolic heart failure, cholelithiasis, lumbar thoracic vertebrae compression fracture, COPD/emphysema, history of tobacco use, hyperlipidemia, hypertension, CAD, history of MI, osteoporosis, history of PACs and palpitations, seborrheic keratosis who was transferred from her nursing home facility due to hypoxia in the mid 80s and confusion about 90 minutes after she was given oral Xanax. The patient is unable to provide further history at this time.  ED Course: Initial vital signs temperature 97.7 F, pulse 67, respirations 26, blood pressure 107/64 mmHg and O2 sat 94% on nasal cannula oxygen.  Patient was given 7050 mL's of NS bolus, a DuoNeb, 60 mg of methylprednisolone IVP x1, vancomycin rostrum of her pharmacy.  I added Lovenox 55 mg SQ x1 after the patient's EKG showed atrial fibrillation with slow ventricular response.  Blood cultures x2 were drawn.  Urinalysis showed hazy appearance with trace leukocyte esterase, but otherwise is normal.  Lactic acid was normal.  White count was 19, hemoglobin 11.8 g/dL and platelets 131.  BMP shows a chloride of 91, CO2 of 39 mmol/L.  Glucose is 113 mg/dL.  All other values are within normal limits.  Review of Systems: Unable to obtain.  Past Medical History:  Diagnosis Date  . CHF (congestive heart failure) ([employer])   . Cholelithiasis 01/30/2013  . Compression fracture of lumbar spine, non-traumatic ([place]) 04/09/2014  . COPD (chronic obstructive pulmonary disease) ([place])   . Diastolic dysfunction 5/46/2703   Grade 1. Ejection fraction 65%.  . Hip fracture ([place])  06/19/2017  . History of tobacco use 01/30/2013  . Hyperlipidemia   . Hypertension   . MI (myocardial infarction) ([place])    pt unaware  . Multiple fractures of thoracic spine, closed ([place]) 01/30/2013  . Osteoporosis   . PAC (premature atrial contraction)   . Palpitations   . Pulmonary emphysema ([place]) 01/30/2013  . Seborrheic keratosis     Past Surgical History:  Procedure Laterality Date  . COMPRESSION HIP SCREW Left 04/29/2013   Procedure: COMPRESSION HIP;  Surgeon: Sanjuana Kava, MD;  Location: [facility];  Service: Orthopedics;  Laterality: Left;  [place]  . ESOPHAGOGASTRODUODENOSCOPY (EGD) WITH PROPOFOL N/A 07/11/2017   Procedure: ESOPHAGOGASTRODUODENOSCOPY (EGD) WITH PROPOFOL;  Surgeon: Ronald Lobo, MD;  Location: [facility];  Service: Endoscopy;  Laterality: N/A;  . FEMUR IM NAIL Right 06/20/2017   Procedure: INTRAMEDULLARY (IM) NAIL FEMORAL;  Surgeon: Rod Can, MD;  Location: [facility];  Service: Orthopedics;  Laterality: Right;  . TUBAL LIGATION       reports that she quit smoking about 6 years ago. Her smoking use included cigarettes. She has a 40.00 pack-year smoking history. She has quit using smokeless tobacco. She reports that she does not drink alcohol or use drugs.  Allergies  Allergen Reactions  . Codeine Other (See Comments)    Headache   . Penicillins     Has patient had a PCN reaction causing immediate rash, facial/tongue/throat swelling, SOB or lightheadedness with hypotension: Yes Has patient had a PCN reaction causing severe rash involving mucus membranes or skin necrosis: No Has patient had a PCN reaction that required hospitalization: Unknown Has patient had a PCN  reaction occurring within the last 10 years: Unknown If all of the above answers are "NO", then may proceed with Cephalosporin use.  Marland Kitchen Benicar Hct [Olmesartan Medoxomil-Hctz] Swelling and Rash  . Hctz [Hydrochlorothiazide] Swelling and Rash  . Naproxen Itching, Rash and Other (See  Comments)    Redness/flushing of skin    Family History  Problem Relation Age of Onset  . Hypertension Mother    Prior to Admission medications   Medication Sig Start Date End Date Taking? Authorizing Provider  ALPRAZolam (XANAX) 0.25 MG tablet Take 0.25 mg by mouth 3 (three) times daily.  05/20/18 05/20/19  [provider]  Calcium Carbonate-Vitamin D (CALCIUM 600+D3) 600-400 MG-UNIT per tablet Take 1 tablet by mouth 2 (two) times daily. 04/09/14   Rexene Alberts, MD  citalopram (CELEXA) 20 MG tablet Take 1 tablet (20 mg total) by mouth daily. 04/22/18   Hassell Done Mary-Margaret, FNP  feeding supplement, ENSURE ENLIVE, (ENSURE ENLIVE) LIQD Take 237 mLs by mouth 2 (two) times daily between meals. Patient not taking: Reported on 12/01/2018 06/24/17   Mariel Aloe, MD  ferrous sulfate 325 (65 FE) MG tablet Take 1 tablet (325 mg total) by mouth 2 (two) times daily with a meal. 04/22/18   Hassell Done, Mary-Margaret, FNP  Ipratropium-Albuterol (COMBIVENT RESPIMAT) 20-100 MCG/ACT AERS respimat Inhale 1 puff into the lungs every 6 (six) hours as needed for wheezing. 04/22/18   Hassell Done, Mary-Margaret, FNP  lisinopril (PRINIVIL,ZESTRIL) 20 MG tablet Take 1 tablet (20 mg total) by mouth daily. 04/22/18   Hassell Done, Mary-Margaret, FNP  metoprolol succinate (TOPROL-XL) 25 MG 24 hr tablet Take 0.5 tablets (12.5 mg total) by mouth daily. 07/02/18   Orson Eva, MD  mirtazapine (REMERON) 15 MG tablet Take 1 tablet (15 mg total) by mouth at bedtime. 04/22/18   Hassell Done, Mary-Margaret, FNP  nitrofurantoin, macrocrystal-monohydrate, (MACROBID) 100 MG capsule Take 1 capsule (100 mg total) by mouth 2 (two) times daily. 12/01/18   Lily Kocher, PA-C  omeprazole (PRILOSEC) 20 MG capsule Take 20 mg by mouth daily.    [provider]  OXYGEN Inhale 4 L into the lungs See admin instructions. TO MAINTAIN SATS OF 93% OR GREATER    [provider]  pantoprazole (PROTONIX) 40 MG tablet Take 1 tablet (40 mg total) by  mouth daily. Patient not taking: Reported on 12/01/2018 04/22/18   [name], FNP  polyethylene glycol powder (GLYCOLAX/MIRALAX) powder 17 GRAMS (1 CAPFUL) ONCE A DAY AS DIRECTED 07/03/16   Hassell Done, Mary-Margaret, FNP  VENTOLIN HFA 108 (90 Base) MCG/ACT inhaler 2 PUFFS EVERY 6 HOURS AS NEEDED FOR WHEEZING OR SHORTNESS OF BREATH 04/03/17   [name], FNP  vitamin C (ASCORBIC ACID) 500 MG tablet Take 1,000 mg by mouth 2 (two) times daily.     [provider]    Physical Exam: Vitals:   12/10/18 0129 12/10/18 0130 12/10/18 0132 12/10/18 0202  BP:  (!) 93/44  (!) 143/58  Pulse:  (!) 46  (!) 52  Resp:  16  15  Temp:      TempSrc:      SpO2: 97% 97% 97% 100%  Weight:      Height:        Constitutional: NAD, calm, comfortable Eyes: PERRL, lids and conjunctivae normal ENMT: Mucous membranes are dry. Posterior pharynx clear of any exudate or lesions. Neck: normal, supple, no masses, no thyromegaly Respiratory: Decreased breath sounds on bases, otherwise clear to auscultation bilaterally, no wheezing, no crackles. Normal respiratory effort. No accessory muscle  use.  Cardiovascular: Bradycardic in the 50s with irregularly irregular rhythm, no murmurs / rubs / gallops. No extremity edema. 2+ pedal pulses. No carotid bruits.  Abdomen: Soft, no tenderness, no masses palpated. No hepatosplenomegaly. Bowel sounds positive.  Musculoskeletal: no clubbing / cyanosis. Good ROM, no contractures. Normal muscle tone.  Skin: no rashes, lesions, ulcers on limited dermatological examination. Neurologic: Moves all extremities, occasionally followed simple commands, but unable to fully evaluate due to somnolence. Psychiatric: Somnolent, but able to be awakened.  Confused.   Labs on Admission: I have personally reviewed following labs and imaging studies  CBC: Recent Labs  Lab 12/10/18 0008  WBC 19.0*  NEUTROABS 15.3*  HGB 11.8*  HCT 40.0  MCV 96.6  PLT 131*   Basic  Metabolic Panel: Recent Labs  Lab 12/10/18 0008  NA 135  K 3.8  CL 91*  CO2 39*  GLUCOSE 113*  BUN 12  CREATININE 0.51  CALCIUM 9.7   GFR: Estimated Creatinine Clearance: 40.9 mL/min (by C-G formula based on SCr of 0.51 mg/dL). Liver Function Tests: No results for input(s): AST, ALT, ALKPHOS, BILITOT, PROT, ALBUMIN in the last 168 hours. No results for input(s): LIPASE, AMYLASE in the last 168 hours. No results for input(s): AMMONIA in the last 168 hours. Coagulation Profile: No results for input(s): INR, PROTIME in the last 168 hours. Cardiac Enzymes: No results for input(s): CKTOTAL, CKMB, CKMBINDEX, TROPONINI in the last 168 hours. BNP (last 3 results) No results for input(s): PROBNP in the last 8760 hours. HbA1C: No results for input(s): HGBA1C in the last 72 hours. CBG: No results for input(s): GLUCAP in the last 168 hours. Lipid Profile: No results for input(s): CHOL, HDL, LDLCALC, TRIG, CHOLHDL, LDLDIRECT in the last 72 hours. Thyroid Function Tests: No results for input(s): TSH, T4TOTAL, FREET4, T3FREE, THYROIDAB in the last 72 hours. Anemia Panel: No results for input(s): VITAMINB12, FOLATE, FERRITIN, TIBC, IRON, RETICCTPCT in the last 72 hours. Urine analysis:    Component Value Date/Time   COLORURINE YELLOW 12/10/2018 0023   APPEARANCEUR HAZY (A) 12/10/2018 0023   LABSPEC 1.009 12/10/2018 0023   PHURINE 6.0 12/10/2018 0023   GLUCOSEU NEGATIVE 12/10/2018 0023   HGBUR NEGATIVE 12/10/2018 0023   BILIRUBINUR NEGATIVE 12/10/2018 0023   KETONESUR NEGATIVE 12/10/2018 0023   PROTEINUR NEGATIVE 12/10/2018 0023   UROBILINOGEN 0.2 04/07/2014 2303   NITRITE NEGATIVE 12/10/2018 0023   LEUKOCYTESUR TRACE (A) 12/10/2018 0023    Radiological Exams on Admission: Dg Chest 2 View  Result Date: 12/10/2018 CLINICAL DATA:  Acute onset of confusion and decreased O2 saturation. Shortness of breath. EXAM: CHEST - 2 VIEW COMPARISON:  Chest radiograph performed 12/01/2018  FINDINGS: The lungs are well-aerated. Right lower lobe airspace opacity is compatible with pneumonia. Peribronchial thickening is noted. There is no evidence of pleural effusion or pneumothorax. The heart is the borderline normal in size. No acute osseous abnormalities are seen. Mild chronic compression deformities are noted along the thoracic and upper lumbar spine. IMPRESSION: Right lower lobe pneumonia noted. Electronically Signed   By: Garald Balding M.D.   On: 12/10/2018 00:11    EKG: Independently reviewed. Vent. rate 49 BPM PR interval * ms QRS duration 110 ms QT/QTc 503/455 ms P-R-T axes 8 -9 66 Sinus bradycardia Atrial premature complex Abnormal R-wave progression, early transition ST elevation, consider inferior injury  Assessment/Plan Principal Problem:   HCAP (healthcare-associated pneumonia) Admit to telemetry/inpatient. Continue supplemental oxygen. Continue scheduled and as needed bronchodilators. Solu-Medrol 40 mg IVP every 8 hours.  Aztreonam per pharmacy. Vancomycin per pharmacy. Follow-up blood culture and sensitivity. Check sputum Gram stain, culture and sensitivity. Check strep pneumoniae urinary antigen.  Active Problems:   Atrial fibrillation with slow ventricular response (HCC) Single dose of enoxaparin 1 mg/kg ordered. Check magnesium level. Check echocardiogram in am. Consider cardiology evaluation.     HTN (hypertension) Hold lisinopril and metoprolol. Monitor blood pressure and heart rate.    GAD (generalized anxiety disorder) Resume citalopram once more alert and does confirmed.    Hyperlipidemia Not on medical therapy.    Chronic combined systolic (EF 37%) and diastolic CHF, NYHA class 1 (HCC) Clinically she is not showing any signs of decompensation. Lisinopril and beta-blocker will be held due to hypotension.     DVT prophylaxis: Full dose Lovenox given. Code Status: Full code. Family Communication: None at bedside. Disposition  Plan: Admit for IV antibiotics for 2 to 3 days and A. fib work-up. Consults called: Routine cardiology consult. Admission status: Inpatient/telemetry.   Reubin Milan MD [facility]   If 7PM-7AM, please contact night-coverage [email or web address] Password [facility]  12/10/2018, 2:39 AM

## 2018-12-10 NOTE — Progress Notes (Addendum)
Pharmacy Antibiotic Note  Tiffany Maxwell is a 84 y.o. female admitted on 12/09/2018 with pneumonia.  Pharmacy has been consulted for Aztreonam and Vancomycin dosing.  Plan: Vancomycin 1000mg  Loading dose, then 750mg  IV every 24 hours.  Goal trough 15-20 mcg/mL.  Aztreonam 2gm IV q8h F/U cxs and clinical progress Monitor V/S, labs and levels as indicated  Height: 5\' 1"  (154.9 cm) Weight: 116 lb 13.5 oz (53 kg) IBW/kg (Calculated) : 47.8  Temp (24hrs), Avg:97.7 F (36.5 C), Min:97.7 F (36.5 C), Max:97.7 F (36.5 C)  Recent Labs  Lab 12/10/18 0008 12/10/18 0042 12/10/18 0257 12/10/18 0436  WBC 19.0*  --   --   --   CREATININE 0.51  --   --  0.45  LATICACIDVEN  --  0.94 0.84  --     Estimated Creatinine Clearance: 40.9 mL/min (by C-G formula based on SCr of 0.45 mg/dL).    Allergies  Allergen Reactions  . Codeine Other (See Comments)    Headache   . Penicillins     Has patient had a PCN reaction causing immediate rash, facial/tongue/throat swelling, SOB or lightheadedness with hypotension: Yes Has patient had a PCN reaction causing severe rash involving mucus membranes or skin necrosis: No Has patient had a PCN reaction that required hospitalization: Unknown Has patient had a PCN reaction occurring within the last 10 years: Unknown If all of the above answers are "NO", then may proceed with Cephalosporin use.  Marland Kitchen Benicar Hct [Olmesartan Medoxomil-Hctz] Swelling and Rash  . Hctz [Hydrochlorothiazide] Swelling and Rash  . Naproxen Itching, Rash and Other (See Comments)    Redness/flushing of skin    Antimicrobials this admission: Aztreonam 12/31 >> 12/31 Vancomycin 12/31 >>  Cefepime 12/31>>  Dose adjustments this admission: N/A  Microbiology results: 12/31 BCx: pending  12/31 UCx: TBC  MRSA PCR:   Addendum : D/c aztreonam Cefepime 2gm IV q24h Thank you for allowing pharmacy to be a part of this patient's care.  Isac Sarna, BS Pharm D, [place] Clinical  Pharmacist Pager [phone] 12/10/2018 8:51 AM

## 2018-12-10 NOTE — Progress Notes (Addendum)
PROGRESS NOTE  Tiffany Maxwell ZOX:[id number] DOB: Apr 27, 1934 DOA: 12/09/2018 PCP: [name], FNP  Brief History:  84 year old female with a history of chronic respiratory failure on 2 L, COPD, chronic pain, hypertension, systolic and diastolic CHF, hyperlipidemia, vertebral compression fracture and cognitive impairment presenting from [facility] secondary to hypoxia and confusion.  Apparently, the patient was given alprazolam 90 minutes before her onset of her confusion.  Because of the patient's cognitive impairment, she is unable to provide any significant history.  She does state that she has had intermittent shortness of breath and a nonproductive cough.  She denies any fevers, chills, chest pain, nausea, vomiting, diarrhea, abdominal pain.  Apparently, the patient was noted to have oxygen saturations in the mid 80s at her facility.  The patient was brought to emergency department for further evaluation.  At baseline, the patient is conversant able to feed herself.  Initial evaluation showed WBC 19.0 with chest x-ray showing right lower lobe opacity.  Patient was started on vancomycin and aztreonam initially.  Assessment/Plan: Sepsis -Present at the time of presentation -Secondary to pneumonia -Presented with leukocytosis, acute on chronic respiratory failure, and altered mental status -Lactic acid peaked at 0.84 -Check procalcitonin -Judicious IV fluids  Acute on chronic respiratory failure with hypoxia -Secondary to HCAP -Presently stable on 4 L -Patient is on 2 L nasal cannula baseline -Wean oxygen for saturation greater 92%  Lobar pneumonia -Concern about possible aspiration -Speech therapy evaluation -MRSA screening -Continue vancomycin -Start cefepime -Patient has tolerated cephalosporins in the past  Acute metabolic encephalopathy -Secondary to infectious process -Improving with antibiotics -At baseline, the patient is conversant and able to  feed herself -UA negative for pyuria  Chronic systolic and diastolic CHF -Appears clinically euvolemic -Daily weights -06/20/2017 echo EF 40%, grade 1 DD, trivial AI -Holding lisinopril and metoprolol succinate secondary to soft blood pressure  Essential hypertension -Holding lisinopril and metoprolol succinate secondary to soft blood pressure  Severe malnutrition -Continue Ensure  Anxiety -continue celexaand remeron   Total time spent 35 minutes.  Greater than 50% spent face to face counseling and coordinating care. 0900 to 0935    Disposition Plan:   SNF 2-3 days  Family Communication:   No Family at bedside  Consultants:  palliative  Code Status:  FULL   DVT Prophylaxis:  SCDs   Procedures: As Listed in Progress Note Above  Antibiotics: vanco 12/31>>> Cefepime 12/31>>        Subjective: Patient complains of intermittent shortness of breath and nonproductive cough patient denies any fevers, chills, chest pain, nausea, vomiting, diarrhea, abdominal pain, dysuria, hematuria.  There is no rashes.  Objective: Vitals:   12/10/18 0730 12/10/18 0800 12/10/18 0852 12/10/18 0855  BP: 118/67 120/67 (!) 131/54   Pulse: (!) 57 (!) 59 (!) 53   Resp: (!) 23 17 18    Temp:   98.1 F (36.7 C)   TempSrc:   Oral   SpO2: 99% 99% 98% 98%  Weight:   52.1 kg   Height:        Intake/Output Summary (Last 24 hours) at 12/10/2018 0930 Last data filed at 12/10/2018 0325 Gross per 24 hour  Intake 2050 ml  Output -  Net 2050 ml   Weight change:  Exam:   General:  Pt is alert, follows commands appropriately, not in acute distress  HEENT: No icterus, No thrush, No neck mass, /AT  Cardiovascular: RRR, S1/S2, no rubs, no gallops  Respiratory: Bibasilar rales, right greater than left.  No wheezing.  Good air movement.  Diminished breath sounds in the right  Abdomen: Soft/+BS, non tender, non distended, no guarding  Extremities: No edema, No lymphangitis, No  petechiae, No rashes, no synovitis   Data Reviewed: I have personally reviewed following labs and imaging studies Basic Metabolic Panel: Recent Labs  Lab 12/10/18 0008 12/10/18 0436  NA 135 136  K 3.8 4.3  CL 91* 97*  CO2 39* 34*  GLUCOSE 113* 121*  BUN 12 12  CREATININE 0.51 0.45  CALCIUM 9.7 9.1  MG 1.8  --   PHOS 2.7  --    Liver Function Tests: No results for input(s): AST, ALT, ALKPHOS, BILITOT, PROT, ALBUMIN in the last 168 hours. No results for input(s): LIPASE, AMYLASE in the last 168 hours. No results for input(s): AMMONIA in the last 168 hours. Coagulation Profile: No results for input(s): INR, PROTIME in the last 168 hours. CBC: Recent Labs  Lab 12/10/18 0008  WBC 19.0*  NEUTROABS 15.3*  HGB 11.8*  HCT 40.0  MCV 96.6  PLT 131*   Cardiac Enzymes: No results for input(s): CKTOTAL, CKMB, CKMBINDEX, TROPONINI in the last 168 hours. BNP: Invalid input(s): POCBNP CBG: No results for input(s): GLUCAP in the last 168 hours. HbA1C: No results for input(s): HGBA1C in the last 72 hours. Urine analysis:    Component Value Date/Time   COLORURINE YELLOW 12/10/2018 0023   APPEARANCEUR HAZY (A) 12/10/2018 0023   LABSPEC 1.009 12/10/2018 0023   PHURINE 6.0 12/10/2018 0023   GLUCOSEU NEGATIVE 12/10/2018 0023   HGBUR NEGATIVE 12/10/2018 0023   BILIRUBINUR NEGATIVE 12/10/2018 0023   KETONESUR NEGATIVE 12/10/2018 0023   PROTEINUR NEGATIVE 12/10/2018 0023   UROBILINOGEN 0.2 04/07/2014 2303   NITRITE NEGATIVE 12/10/2018 0023   LEUKOCYTESUR TRACE (A) 12/10/2018 0023   Sepsis Labs: @LABRCNTIP (procalcitonin:4,lacticidven:4) ) Recent Results (from the past 240 hour(s))  Urine Culture     Status: Abnormal   Collection Time: 12/01/18  9:58 AM  Result Value Ref Range Status   Specimen Description   Final    URINE, CATHETERIZED Performed at [facility], [address], [place], [place] [address]    Special Requests   Final    NONE Performed at [facility], [address] [place], [place] [address]    Culture 50,000 COLONIES/mL PROTEUS MIRABILIS (A)  Final   Report Status 12/04/2018 FINAL  Final   Organism ID, Bacteria PROTEUS MIRABILIS (A)  Final      Susceptibility   Proteus mirabilis - MIC*    AMPICILLIN <=2 SENSITIVE Sensitive     CEFAZOLIN <=4 SENSITIVE Sensitive     CEFTRIAXONE <=1 SENSITIVE Sensitive     CIPROFLOXACIN >=4 RESISTANT Resistant     GENTAMICIN 8 INTERMEDIATE Intermediate     IMIPENEM 2 SENSITIVE Sensitive     NITROFURANTOIN 128 RESISTANT Resistant     TRIMETH/SULFA >=320 RESISTANT Resistant     AMPICILLIN/SULBACTAM <=2 SENSITIVE Sensitive     PIP/TAZO <=4 SENSITIVE Sensitive     * 50,000 COLONIES/mL PROTEUS MIRABILIS  Blood Culture (routine x 2)     Status: None (Preliminary result)   Collection Time: 12/10/18 12:47 AM  Result Value Ref Range Status   Specimen Description BLOOD RIGHT ANTECUBITAL  Final   Special Requests   Final    BOTTLES DRAWN AEROBIC AND ANAEROBIC Blood Culture results may not be optimal due to an excessive volume of blood received in culture bottles   Culture  Final    NO GROWTH < 12 HOURS Performed at [facility], [address], [place], [place] [address]    Report Status PENDING  Incomplete  Blood Culture (routine x 2)     Status: None (Preliminary result)   Collection Time: 12/10/18 12:49 AM  Result Value Ref Range Status   Specimen Description BLOOD LEFT FOREARM  Final   Special Requests   Final    BOTTLES DRAWN AEROBIC AND ANAEROBIC Blood Culture adequate volume   Culture   Final    NO GROWTH < 12 HOURS Performed at [facility], [address], [place], [place] [address]    Report Status PENDING  Incomplete     Scheduled Meds: . ipratropium-albuterol  3 mL Nebulization Q6H   Continuous Infusions: . sodium chloride 100 mL/hr at 12/10/18 0917  . aztreonam    . [START ON 12/11/2018] vancomycin      Procedures/Studies: Dg Chest 2 View  Result Date: 12/10/2018 CLINICAL  DATA:  Acute onset of confusion and decreased O2 saturation. Shortness of breath. EXAM: CHEST - 2 VIEW COMPARISON:  Chest radiograph performed 12/01/2018 FINDINGS: The lungs are well-aerated. Right lower lobe airspace opacity is compatible with pneumonia. Peribronchial thickening is noted. There is no evidence of pleural effusion or pneumothorax. The heart is the borderline normal in size. No acute osseous abnormalities are seen. Mild chronic compression deformities are noted along the thoracic and upper lumbar spine. IMPRESSION: Right lower lobe pneumonia noted. Electronically Signed   By: Garald Balding M.D.   On: 12/10/2018 00:11   Dg Chest Portable 1 View  Result Date: 12/01/2018 CLINICAL DATA:  Altered mental status, COPD EXAM: PORTABLE CHEST 1 VIEW COMPARISON:  11/08/2018 FINDINGS: Stable cardiomegaly with vascular congestion and chronic appearing diffuse reticulonodular interstitial opacities, suspect chronic interstitial lung disease. Difficult to exclude interstitial edema. No effusion or pneumothorax. No significant collapse or consolidation. Aorta atherosclerotic. Bones are osteopenic. IMPRESSION: Cardiomegaly with chronic interstitial opacities, suspect chronic lung disease, less likely edema. Electronically Signed   By: Jerilynn Mages.  Shick M.D.   On: 12/01/2018 10:22    Orson Eva, DO  [facility] Pager [phone]  If 7PM-7AM, please contact night-coverage [email or web address] Password [email or web address] 12/10/2018, 9:30 AM   LOS: 0 days

## 2018-12-10 NOTE — Progress Notes (Signed)
*  PRELIMINARY RESULTS* Echocardiogram 2D Echocardiogram has been performed.  Tiffany Maxwell 12/10/2018, 3:00 PM

## 2018-12-10 NOTE — Consult Note (Signed)
Consultation Note Date: 12/10/2018   Patient Name: PRISILA DLOUHY  DOB: [birth year]  MRN: [id number]  Age / Sex: 84 y.o., female  PCP: [name], [place] Referring Physician: Orson Eva, MD  Reason for Consultation: Establishing goals of care  HPI/Patient Profile: 84 y.o. female  with past medical history of CHF, compression fracture lumbar spine, COPD/emphysema, history of hip fracture, multiple fractures of thoracic spine, osteoporosis, PACs and palpitations, SNF resident admitted on 12/09/2018 with health care associated pneumonia, A. fib.  Palliative medicine consulted for goals of care  Clinical Assessment and Goals of Care: Mrs. Hakimian is resting quietly in bed.  She wakes easily, making and mostly keeping eye contact.  She is calm and cooperative, not fearful.  There is no family at bedside at this time.  She tells me that she is in the hospital, and that she lives in [place].  She does tell me that she is a resident of a skilled nursing facility.  When asked about her family she shares that her husband died approximately 5 years ago, she has 46 living children, 2 died as adults.  She is also able to tell me that she is admitted for pneumonia.  Mrs. Marsan shares that her mobility is poor, she is not walking as she has broken both hips.  We talked briefly about healthcare power of attorney, see below.  We also talked about CODE STATUS.  At this point she elects to "allow a natural death".  Conversation needs to be had with responsible party, son Lillymae Duet.  Call to son, Almas Rake at [phone]. Left generic VM message. Call to  [phone].  Again, left generic voicemail message.  HCPOA  NEXT OF KIN -Mrs. Leeman tells me that her husband died about 5 years ago, she has 2 deceased children that died as adults, she has 4 living children.  When asked about healthcare power of attorney she  names her son Breezy Hertenstein who she tells me is a Artist.   SUMMARY OF RECOMMENDATIONS   At this point, continue to treat the treatable. Continue CODE STATUS  discussions when family are present. Anticipate return to residential SNF, [facility]:  Full code, although she endorses to allow a natural death, there is no family present to discuss code status.   Symptom Management:   Per hospitalist, no additional needs at this time.  Palliative Prophylaxis:   Frequent Pain Assessment and Turn Reposition  Additional Recommendations (Limitations, Scope, Preferences):  Full Scope Treatment  Psycho-social/Spiritual:   Desire for further Chaplaincy support:no  Additional Recommendations: Caregiving  Support/Resources and Education on Hospice  Prognosis:   < 6 months, would not be surprising based on frailty, advanced age, poor functional status, 2 hospitalizations and 2 ED visits in 6 months, admitted with sepsis.  Discharge Planning: Anticipate return to residential SNF, [facility].      Primary Diagnoses: Present on Admission: . HCAP (healthcare-associated pneumonia) . Chronic combined systolic (EF 76%) and diastolic  CHF, NYHA class 1 ([place]) . HTN (hypertension) . Hyperlipidemia . GAD (generalized anxiety disorder) . Lobar pneumonia ([place]) . Pulmonary emphysema ([place]) . Acute on chronic respiratory failure with hypoxia ([place])   I have reviewed the medical record, interviewed the patient and family, and examined the patient. The following aspects are pertinent.  Past Medical History:  Diagnosis Date  . CHF (congestive heart failure) ([place])   . Cholelithiasis 01/30/2013  . Compression fracture of lumbar spine, non-traumatic ([place]) 04/09/2014  . COPD (chronic obstructive pulmonary disease) ([place])   . Diastolic dysfunction 06/03/7627   Grade 1. Ejection fraction 65%.  . Hip fracture ([place]) 06/19/2017  . History of tobacco use  01/30/2013  . Hyperlipidemia   . Hypertension   . MI (myocardial infarction) ([place])    pt unaware  . Multiple fractures of thoracic spine, closed ([place]) 01/30/2013  . Osteoporosis   . PAC (premature atrial contraction)   . Palpitations   . Pulmonary emphysema ([place]) 01/30/2013  . Seborrheic keratosis    Social History   Socioeconomic History  . Marital status: Widowed    Spouse name: Not on file  . Number of children: Not on file  . Years of education: Not on file  . Highest education level: Not on file  Occupational History  . Not on file  Social Needs  . Financial resource strain: Not on file  . Food insecurity:    Worry: Not on file    Inability: Not on file  . Transportation needs:    Medical: Not on file    Non-medical: Not on file  Tobacco Use  . Smoking status: Former Smoker    Packs/day: 1.00    Years: 40.00    Pack years: 40.00    Types: Cigarettes    Last attempt to quit: 07/10/2012    Years since quitting: 6.4  . Smokeless tobacco: Former Network engineer and Sexual Activity  . Alcohol use: No  . Drug use: No  . Sexual activity: Never  Lifestyle  . Physical activity:    Days per week: Not on file    Minutes per session: Not on file  . Stress: Not on file  Relationships  . Social connections:    Talks on phone: Not on file    Gets together: Not on file    Attends religious service: Not on file    Active member of club or organization: Not on file    Attends meetings of clubs or organizations: Not on file    Relationship status: Not on file  Other Topics Concern  . Not on file  Social History Narrative  . Not on file   Family History  Problem Relation Age of Onset  . Hypertension Mother    Scheduled Meds: . feeding supplement (ENSURE ENLIVE)  237 mL Oral BID BM  . ferrous sulfate  325 mg Oral BID WC  . ipratropium-albuterol  3 mL Nebulization Q8H  . pantoprazole  40 mg Oral Daily  . polyethylene glycol  17 g Oral Daily   Continuous  Infusions: . sodium chloride 100 mL/hr at 12/10/18 0917  . ceFEPime (MAXIPIME) IV 2 g (12/10/18 1113)  . [START ON 12/11/2018] vancomycin     PRN Meds:.acetaminophen **OR** acetaminophen, albuterol, ALPRAZolam, ondansetron **OR** ondansetron (ZOFRAN) IV Medications Prior to Admission:  Prior to Admission medications   Medication Sig Start Date End Date Taking? Authorizing Provider  ALPRAZolam (XANAX) 0.25 MG tablet Take 0.25 mg by mouth 3 (three) times daily.  05/20/18 05/20/19  [provider]  Calcium Carbonate-Vitamin D (CALCIUM 600+D3) 600-400 MG-UNIT per tablet Take 1 tablet by mouth 2 (two) times daily. 04/09/14   Rexene Alberts, MD  citalopram (CELEXA) 20 MG tablet Take 1 tablet (20 mg total) by mouth daily. 04/22/18   Hassell Done Mary-Margaret, FNP  feeding supplement, ENSURE ENLIVE, (ENSURE ENLIVE) LIQD Take 237 mLs by mouth 2 (two) times daily between meals. Patient not taking: Reported on 12/01/2018 06/24/17   Mariel Aloe, MD  ferrous sulfate 325 (65 FE) MG tablet Take 1 tablet (325 mg total) by mouth 2 (two) times daily with a meal. 04/22/18   Hassell Done, Mary-Margaret, FNP  Ipratropium-Albuterol (COMBIVENT RESPIMAT) 20-100 MCG/ACT AERS respimat Inhale 1 puff into the lungs every 6 (six) hours as needed for wheezing. 04/22/18   Hassell Done, Mary-Margaret, FNP  lisinopril (PRINIVIL,ZESTRIL) 20 MG tablet Take 1 tablet (20 mg total) by mouth daily. 04/22/18   Hassell Done, Mary-Margaret, FNP  metoprolol succinate (TOPROL-XL) 25 MG 24 hr tablet Take 0.5 tablets (12.5 mg total) by mouth daily. 07/02/18   Orson Eva, MD  mirtazapine (REMERON) 15 MG tablet Take 1 tablet (15 mg total) by mouth at bedtime. 04/22/18   Hassell Done, Mary-Margaret, FNP  nitrofurantoin, macrocrystal-monohydrate, (MACROBID) 100 MG capsule Take 1 capsule (100 mg total) by mouth 2 (two) times daily. 12/01/18   Lily Kocher, PA-C  omeprazole (PRILOSEC) 20 MG capsule Take 20 mg by mouth daily.    [provider]  OXYGEN Inhale 4 L  into the lungs See admin instructions. TO MAINTAIN SATS OF 93% OR GREATER    [provider]  pantoprazole (PROTONIX) 40 MG tablet Take 1 tablet (40 mg total) by mouth daily. Patient not taking: Reported on 12/01/2018 04/22/18   [name], FNP  polyethylene glycol powder (GLYCOLAX/MIRALAX) powder 17 GRAMS (1 CAPFUL) ONCE A DAY AS DIRECTED 07/03/16   Hassell Done, Mary-Margaret, FNP  VENTOLIN HFA 108 (90 Base) MCG/ACT inhaler 2 PUFFS EVERY 6 HOURS AS NEEDED FOR WHEEZING OR SHORTNESS OF BREATH 04/03/17   [name], FNP  vitamin C (ASCORBIC ACID) 500 MG tablet Take 1,000 mg by mouth 2 (two) times daily.     [provider]   Allergies  Allergen Reactions  . Codeine Other (See Comments)    Headache   . Penicillins     Has patient had a PCN reaction causing immediate rash, facial/tongue/throat swelling, SOB or lightheadedness with hypotension: Yes Has patient had a PCN reaction causing severe rash involving mucus membranes or skin necrosis: No Has patient had a PCN reaction that required hospitalization: Unknown Has patient had a PCN reaction occurring within the last 10 years: Unknown If all of the above answers are "NO", then may proceed with Cephalosporin use.  Marland Kitchen Benicar Hct [Olmesartan Medoxomil-Hctz] Swelling and Rash  . Hctz [Hydrochlorothiazide] Swelling and Rash  . Naproxen Itching, Rash and Other (See Comments)    Redness/flushing of skin   Review of Systems  Unable to perform ROS: Age    Physical Exam Vitals signs and nursing note reviewed.     Vital Signs: BP (!) 131/54 (BP Location: Right Arm)   Pulse (!) 53   Temp 98.1 F (36.7 C) (Oral)   Resp 18   Ht 5\' 1"  (1.549 m)   Wt 52.1 kg   SpO2 98%   BMI 21.70 kg/m  Pain Scale: Faces       SpO2: SpO2: 98 % O2 Device:SpO2: 98 % O2 Flow Rate: .O2 Flow Rate (L/min): 4 L/min  IO: Intake/output summary:   Intake/Output Summary (Last 24 hours) at 12/10/2018 1224 Last data filed at  12/10/2018 0325 Gross per 24 hour  Intake 2050 ml  Output -  Net 2050 ml    LBM:   Baseline Weight: Weight: 53 kg Most recent weight: Weight: 52.1 kg     Palliative Assessment/Data:   Flowsheet Rows     Most Recent Value  Intake Tab  Referral Department  Hospitalist  Unit at Time of Referral  Cardiac/Telemetry Unit  Palliative Care Primary Diagnosis  Sepsis/Infectious Disease  Date Notified  12/10/18  Reason for referral  Clarify Goals of Care  Date of Admission  12/10/18  Date first seen by Palliative Care  12/10/18  # of days Palliative referral response time  0 Day(s)  # of days IP prior to Palliative referral  0  Clinical Assessment  Palliative Performance Scale Score  20%  Pain Max last 24 hours  Not able to report  Pain Min Last 24 hours  Not able to report  Dyspnea Max Last 24 Hours  Not able to report  Dyspnea Min Last 24 hours  Not able to report  Psychosocial & Spiritual Assessment  Palliative Care Outcomes  Patient/Family meeting held?  Yes  Who was at the meeting?  pt at bedside, call to son      Time In: 0955 Time Out: 1045 Time Total: 50 minutes  Greater than 50%  of this time was spent counseling and coordinating care related to the above assessment and plan.  Signed by: Drue Novel, NP   Please contact Palliative Medicine Team phone at [phone] for questions and concerns.  For individual provider: See Shea Evans

## 2018-12-10 NOTE — Evaluation (Signed)
Clinical/Bedside Swallow Evaluation Patient Details  Name: Tiffany Maxwell MRN: [id number] Date of Birth: 09-17-1934  Today's Date: 12/10/2018 Time: SLP Start Time (ACUTE ONLY): 1210 SLP Stop Time (ACUTE ONLY): 1234 SLP Time Calculation (min) (ACUTE ONLY): 24 min  Past Medical History:  Past Medical History:  Diagnosis Date  . CHF (congestive heart failure) ([place])   . Cholelithiasis 01/30/2013  . Compression fracture of lumbar spine, non-traumatic ([place]) 04/09/2014  . COPD (chronic obstructive pulmonary disease) ([place])   . Diastolic dysfunction 3/50/0938   Grade 1. Ejection fraction 65%.  . Hip fracture ([place]) 06/19/2017  . History of tobacco use 01/30/2013  . Hyperlipidemia   . Hypertension   . MI (myocardial infarction) ([place])    pt unaware  . Multiple fractures of thoracic spine, closed ([place]) 01/30/2013  . Osteoporosis   . PAC (premature atrial contraction)   . Palpitations   . Pulmonary emphysema ([place]) 01/30/2013  . Seborrheic keratosis    Past Surgical History:  Past Surgical History:  Procedure Laterality Date  . COMPRESSION HIP SCREW Left 04/29/2013   Procedure: COMPRESSION HIP;  Surgeon: Sanjuana Kava, MD;  Location: [facility];  Service: Orthopedics;  Laterality: Left;  [place]  . ESOPHAGOGASTRODUODENOSCOPY (EGD) WITH PROPOFOL N/A 07/11/2017   Procedure: ESOPHAGOGASTRODUODENOSCOPY (EGD) WITH PROPOFOL;  Surgeon: Ronald Lobo, MD;  Location: [facility];  Service: Endoscopy;  Laterality: N/A;  . FEMUR IM NAIL Right 06/20/2017   Procedure: INTRAMEDULLARY (IM) NAIL FEMORAL;  Surgeon: Rod Can, MD;  Location: [facility];  Service: Orthopedics;  Laterality: Right;  . TUBAL LIGATION     HPI:  84 year old female with a history of chronic respiratory failure on 2 L, COPD, chronic pain, hypertension, systolic and diastolic CHF, hyperlipidemia, vertebral compression fracture and cognitive impairment presenting from [facility] secondary to hypoxia and confusion.  Apparently, the  patient was given alprazolam 90 minutes before her onset of her confusion.  Because of the patient's cognitive impairment, she is unable to provide any significant history.  She does state that she has had intermittent shortness of breath and a nonproductive cough.  She denies any fevers, chills, chest pain, nausea, vomiting, diarrhea, abdominal pain.  Apparently, the patient was noted to have oxygen saturations in the mid 80s at her facility.  The patient was brought to emergency department for further evaluation.  At baseline, the patient is conversant able to feed herself.  Initial evaluation showed WBC 19.0 with chest x-ray showing right lower lobe opacity.  Patient was started on vancomycin and aztreonam initially. BSE requested.   Assessment / Plan / Recommendation Clinical Impression  Clinical swallow evaluation completed at bedside. Pt seen with lunch meal. Pt wears upper dentures, tongue slightly coated. Oral motor examination WNL. Pt able to self present with set up assist. No overt signs of aspiration with thin liquids via cup and straw sips. Prolonged mastication with solids and mild oral residue. Recommend D2/chopped with thin liquids with intermittent supervision and feeder assist as needed. PO medications whole in puree or with water. SLP will follow during acute stay.   SLP Visit Diagnosis: Dysphagia, unspecified (R13.10)    Aspiration Risk  Mild aspiration risk    Diet Recommendation Dysphagia 2 (Fine chop);Thin liquid   Liquid Administration via: Cup;Straw Medication Administration: Whole meds with liquid Supervision: Patient able to self feed;Intermittent supervision to cue for compensatory strategies Compensations: Small sips/bites;Slow rate;Lingual sweep for clearance of pocketing Postural Changes: Seated upright at 90 degrees;Remain upright for at least 30 minutes after po  intake    Other  Recommendations Oral Care Recommendations: Oral care BID;Staff/trained caregiver to  provide oral care Other Recommendations: Clarify dietary restrictions   Follow up Recommendations Skilled Nursing facility      Frequency and Duration min 2x/week  1 week       Prognosis Prognosis for Safe Diet Advancement: Good      Swallow Study   General Date of Onset: 12/10/18 HPI: 84 year old female with a history of chronic respiratory failure on 2 L, COPD, chronic pain, hypertension, systolic and diastolic CHF, hyperlipidemia, vertebral compression fracture and cognitive impairment presenting from [facility] secondary to hypoxia and confusion.  Apparently, the patient was given alprazolam 90 minutes before her onset of her confusion.  Because of the patient's cognitive impairment, she is unable to provide any significant history.  She does state that she has had intermittent shortness of breath and a nonproductive cough.  She denies any fevers, chills, chest pain, nausea, vomiting, diarrhea, abdominal pain.  Apparently, the patient was noted to have oxygen saturations in the mid 80s at her facility.  The patient was brought to emergency department for further evaluation.  At baseline, the patient is conversant able to feed herself.  Initial evaluation showed WBC 19.0 with chest x-ray showing right lower lobe opacity.  Patient was started on vancomycin and aztreonam initially. BSE requested. Type of Study: Bedside Swallow Evaluation Previous Swallow Assessment: none on record Diet Prior to this Study: Regular;Thin liquids Temperature Spikes Noted: No Respiratory Status: Nasal cannula History of Recent Intubation: No Behavior/Cognition: Alert;Cooperative;Pleasant mood Oral Cavity Assessment: Within Functional Limits(coated tongue) Oral Care Completed by SLP: Yes Oral Cavity - Dentition: Dentures, top Vision: Functional for self-feeding Self-Feeding Abilities: Able to feed self;Needs set up Patient Positioning: Upright in bed Baseline Vocal Quality: Normal Volitional Cough:  Congested;Weak Volitional Swallow: Able to elicit    Oral/Motor/Sensory Function Overall Oral Motor/Sensory Function: Within functional limits   Ice Chips Ice chips: Within functional limits Presentation: Spoon   Thin Liquid Thin Liquid: Within functional limits Presentation: Cup;Straw    Nectar Thick Nectar Thick Liquid: Not tested   Honey Thick Honey Thick Liquid: Not tested   Puree Puree: Within functional limits Presentation: Spoon   Solid     Solid: Impaired Presentation: Self Fed Oral Phase Impairments: Impaired mastication Oral Phase Functional Implications: Prolonged oral transit     Thank you,  Genene Churn, [place]  PORTER,DABNEY 12/10/2018,12:34 PM

## 2018-12-11 DIAGNOSIS — J189 Pneumonia, unspecified organism: Secondary | ICD-10-CM

## 2018-12-11 DIAGNOSIS — Z7189 Other specified counseling: Secondary | ICD-10-CM

## 2018-12-11 DIAGNOSIS — J181 Lobar pneumonia, unspecified organism: Secondary | ICD-10-CM

## 2018-12-11 LAB — CBC WITH DIFFERENTIAL/PLATELET
Abs Immature Granulocytes: 0.03 10*3/uL (ref 0.00–0.07)
Basophils Absolute: 0 10*3/uL (ref 0.0–0.1)
Basophils Relative: 0 %
Eosinophils Absolute: 0.3 10*3/uL (ref 0.0–0.5)
Eosinophils Relative: 3 %
HEMATOCRIT: 35 % — AB (ref 36.0–46.0)
Hemoglobin: 10.4 g/dL — ABNORMAL LOW (ref 12.0–15.0)
Immature Granulocytes: 0 %
Lymphocytes Relative: 16 %
Lymphs Abs: 1.3 10*3/uL (ref 0.7–4.0)
MCH: 29.4 pg (ref 26.0–34.0)
MCHC: 29.7 g/dL — ABNORMAL LOW (ref 30.0–36.0)
MCV: 98.9 fL (ref 80.0–100.0)
MONO ABS: 0.8 10*3/uL (ref 0.1–1.0)
Monocytes Relative: 10 %
Neutro Abs: 5.6 10*3/uL (ref 1.7–7.7)
Neutrophils Relative %: 71 %
Platelets: 102 10*3/uL — ABNORMAL LOW (ref 150–400)
RBC: 3.54 MIL/uL — ABNORMAL LOW (ref 3.87–5.11)
RDW: 12.1 % (ref 11.5–15.5)
WBC: 8 10*3/uL (ref 4.0–10.5)
nRBC: 0 % (ref 0.0–0.2)

## 2018-12-11 LAB — PROCALCITONIN: Procalcitonin: <0.1 ng/mL

## 2018-12-11 LAB — BASIC METABOLIC PANEL
Anion gap: <3 — ABNORMAL LOW (ref 5–15)
BUN: 14 mg/dL (ref 8–23)
CHLORIDE: 103 mmol/L (ref 98–111)
CO2: 35 mmol/L — ABNORMAL HIGH (ref 22–32)
Calcium: 9.2 mg/dL (ref 8.9–10.3)
Creatinine, Ser: 0.31 mg/dL — ABNORMAL LOW (ref 0.44–1.00)
GFR calc Af Amer: >60 mL/min (ref >60)
GFR calc non Af Amer: >60 mL/min (ref >60)
Glucose, Bld: 89 mg/dL (ref 70–99)
Potassium: 3.7 mmol/L (ref 3.5–5.1)
Sodium: 140 mmol/L (ref 135–145)

## 2018-12-11 MED ORDER — IPRATROPIUM-ALBUTEROL 0.5-2.5 (3) MG/3ML IN SOLN
3.0000 mL | Freq: Three times a day (TID) | RESPIRATORY_TRACT | Status: DC
  Administered 2018-12-11 – 2018-12-12 (×4): 3 mL via RESPIRATORY_TRACT
  Filled 2018-12-11 (×4): qty 3

## 2018-12-11 NOTE — Progress Notes (Signed)
  Speech Language Pathology Treatment: Dysphagia  Patient Details Name: Tiffany Maxwell MRN: [id number] DOB: March 03, 1934 Today's Date: 12/11/2018 Time: 8280-0349 SLP Time Calculation (min) (ACUTE ONLY): 18 min  Assessment / Plan / Recommendation Clinical Impression  Pt seen for ongoing diagnostic dysphagia intervention and observed with puree, minced, and thin liquids. Pt with one episode of delayed, dry cough after straw sips thin liquids, otherwise without incident. Lunch tray was in room and barely touched (dinner tray coming soon). Pt would benefit from set up assist for self feeding. Continue diet as ordered. RN reports that Pt is taking her medications well.    HPI HPI: 85 year old female with a history of chronic respiratory failure on 2 L, COPD, chronic pain, hypertension, systolic and diastolic CHF, hyperlipidemia, vertebral compression fracture and cognitive impairment presenting from [facility] secondary to hypoxia and confusion.  Apparently, the patient was given alprazolam 90 minutes before her onset of her confusion.  Because of the patient's cognitive impairment, she is unable to provide any significant history.  She does state that she has had intermittent shortness of breath and a nonproductive cough.  She denies any fevers, chills, chest pain, nausea, vomiting, diarrhea, abdominal pain.  Apparently, the patient was noted to have oxygen saturations in the mid 80s at her facility.  The patient was brought to emergency department for further evaluation.  At baseline, the patient is conversant able to feed herself.  Initial evaluation showed WBC 19.0 with chest x-ray showing right lower lobe opacity.  Patient was started on vancomycin and aztreonam initially. BSE requested.      SLP Plan  Continue with current plan of care       Recommendations  Diet recommendations: Dysphagia 2 (fine chop);Thin liquid Liquids provided via: Cup;Straw Medication Administration: Whole meds with  liquid Supervision: Patient able to self feed;Intermittent supervision to cue for compensatory strategies Compensations: Small sips/bites;Slow rate;Lingual sweep for clearance of pocketing Postural Changes and/or Swallow Maneuvers: Seated upright 90 degrees;Upright 30-60 min after meal                Oral Care Recommendations: Oral care BID;Staff/trained caregiver to provide oral care Follow up Recommendations: Skilled Nursing facility SLP Visit Diagnosis: Dysphagia, unspecified (R13.10) Plan: Continue with current plan of care       Thank you,  Genene Churn, [place]                 [place] 12/11/2018, 5:19 PM

## 2018-12-11 NOTE — Progress Notes (Signed)
PROGRESS NOTE  Tiffany Maxwell QVZ:[id number] DOB: 07-21-1934 DOA: 12/09/2018 PCP: [name], FNP   Brief History:  85 year old female with a history of chronic respiratory failure on 2 L, COPD, chronic pain, hypertension, systolic and diastolic CHF, hyperlipidemia, vertebral compression fracture and cognitive impairment presenting from [facility] secondary to hypoxia and confusion.  Apparently, the patient was given alprazolam 90 minutes before her onset of her confusion.  Because of the patient's cognitive impairment, she is unable to provide any significant history.  She does state that she has had intermittent shortness of breath and a nonproductive cough.  She denies any fevers, chills, chest pain, nausea, vomiting, diarrhea, abdominal pain.  Apparently, the patient was noted to have oxygen saturations in the mid 80s at her facility.  The patient was brought to emergency department for further evaluation.  At baseline, the patient is conversant able to feed herself.  Initial evaluation showed WBC 19.0 with chest x-ray showing right lower lobe opacity.  Patient was started on vancomycin and aztreonam initially.  Assessment/Plan: Sepsis -Present at the time of presentation -Secondary to pneumonia -Presented with leukocytosis, acute on chronic respiratory failure, and altered mental status -Lactic acid peaked at 0.84 -Check procalcitonin <0.10 -Judicious IV fluids -Sepsis physiology resolved  Acute on chronic respiratory failure with hypoxia -Secondary to HCAP -Presently stable on 4 L>>>L -Patient is on 2 L nasal cannula baseline -Wean oxygen for saturation greater 92%  Lobar pneumonia -Concern about possible aspiration -Speech therapy evaluation -MRSA screening -disContinue vancomycin -Conitnue cefepime -Patient has tolerated cephalosporins in the past  Acute metabolic encephalopathy -Secondary to infectious process -Improving with antibiotics -At  baseline, the patient is conversant and able to feed herself -UA negative for pyuria -sister at bedside states pt is improving, near baseline--she also notes continued cognitive decline over last several months  Dysphagia -Speech therapy evaluation appreciated--> dysphagia 2 with thin liquids  Chronic systolic and diastolic CHF -Appears clinically euvolemic -Daily weights -06/20/2017 echo EF 40%, grade 1 DD, trivial AI -Holding lisinopril and metoprolol succinate secondary to soft blood pressure  Essential hypertension -Holding lisinopril and metoprolol succinate secondary to soft blood pressure  Severe malnutrition -Continue Ensure  Anxiety -continue celexaand remeron  Thrombocytopenia -due to sepsis -am CBC    Disposition Plan:   SNF 2-3 days  Family Communication:   No Family at bedside  Consultants:  palliative  Code Status:  FULL   DVT Prophylaxis:  SCDs   Procedures: As Listed in Progress Note Above  Antibiotics: vanco 12/31>>>1/1 Cefepime 12/31>>    Subjective: Patient denies fevers, chills, headache, chest pain, dyspnea, nausea, vomiting, diarrhea, abdominal pain, dysuria, hematuria, hematochezia, and melena.   Objective: Vitals:   12/11/18 0543 12/11/18 0817 12/11/18 1338 12/11/18 1519  BP: 133/69   122/62  Pulse: 63   67  Resp: 17   20  Temp: 97.7 F (36.5 C)   98.6 F (37 C)  TempSrc: Oral   Oral  SpO2: 95% 98% 94% 92%  Weight:      Height:        Intake/Output Summary (Last 24 hours) at 12/11/2018 1719 Last data filed at 12/11/2018 0900 Gross per 24 hour  Intake 1361.39 ml  Output 300 ml  Net 1061.39 ml   Weight change: -0.9 kg Exam:   General:  Pt is alert, follows commands appropriately, not in acute distress  HEENT: No icterus, No thrush, No neck mass, [place]/AT  Cardiovascular: RRR, S1/S2,  no rubs, no gallops  Respiratory: Scattered bilateral rales.  No wheezing.  Good air movement.  Abdomen: Soft/+BS, non  tender, non distended, no guarding  Extremities: No edema, No lymphangitis, No petechiae, No rashes, no synovitis   Data Reviewed: I have personally reviewed following labs and imaging studies Basic Metabolic Panel: Recent Labs  Lab 12/10/18 0008 12/10/18 0436 12/11/18 0439  NA 135 136 140  K 3.8 4.3 3.7  CL 91* 97* 103  CO2 39* 34* 35*  GLUCOSE 113* 121* 89  BUN 12 12 14   CREATININE 0.51 0.45 0.31*  CALCIUM 9.7 9.1 9.2  MG 1.8  --   --   PHOS 2.7  --   --    Liver Function Tests: No results for input(s): AST, ALT, ALKPHOS, BILITOT, PROT, ALBUMIN in the last 168 hours. No results for input(s): LIPASE, AMYLASE in the last 168 hours. No results for input(s): AMMONIA in the last 168 hours. Coagulation Profile: No results for input(s): INR, PROTIME in the last 168 hours. CBC: Recent Labs  Lab 12/10/18 0008 12/10/18 0436 12/11/18 0439  WBC 19.0* 13.8* 8.0  NEUTROABS 15.3* 12.3* 5.6  HGB 11.8* 11.4* 10.4*  HCT 40.0 39.0 35.0*  MCV 96.6 99.5 98.9  PLT 131* 107* 102*   Cardiac Enzymes: No results for input(s): CKTOTAL, CKMB, CKMBINDEX, TROPONINI in the last 168 hours. BNP: Invalid input(s): POCBNP CBG: No results for input(s): GLUCAP in the last 168 hours. HbA1C: No results for input(s): HGBA1C in the last 72 hours. Urine analysis:    Component Value Date/Time   COLORURINE YELLOW 12/10/2018 0023   APPEARANCEUR HAZY (A) 12/10/2018 0023   LABSPEC 1.009 12/10/2018 0023   PHURINE 6.0 12/10/2018 0023   GLUCOSEU NEGATIVE 12/10/2018 0023   HGBUR NEGATIVE 12/10/2018 0023   BILIRUBINUR NEGATIVE 12/10/2018 0023   KETONESUR NEGATIVE 12/10/2018 0023   PROTEINUR NEGATIVE 12/10/2018 0023   UROBILINOGEN 0.2 04/07/2014 2303   NITRITE NEGATIVE 12/10/2018 0023   LEUKOCYTESUR TRACE (A) 12/10/2018 0023   Sepsis Labs: @LABRCNTIP (procalcitonin:4,lacticidven:4) ) Recent Results (from the past 240 hour(s))  Blood Culture (routine x 2)     Status: None (Preliminary result)    Collection Time: 12/10/18 12:47 AM  Result Value Ref Range Status   Specimen Description BLOOD RIGHT ANTECUBITAL  Final   Special Requests   Final    BOTTLES DRAWN AEROBIC AND ANAEROBIC Blood Culture results may not be optimal due to an excessive volume of blood received in culture bottles   Culture   Final    NO GROWTH 1 DAY Performed at [facility], [address], [place], [place] [address]    Report Status PENDING  Incomplete  Blood Culture (routine x 2)     Status: None (Preliminary result)   Collection Time: 12/10/18 12:49 AM  Result Value Ref Range Status   Specimen Description BLOOD LEFT FOREARM  Final   Special Requests   Final    BOTTLES DRAWN AEROBIC AND ANAEROBIC Blood Culture adequate volume   Culture   Final    NO GROWTH 1 DAY Performed at [facility], [address] [place], [place] [address]    Report Status PENDING  Incomplete  MRSA PCR Screening     Status: None   Collection Time: 12/10/18  9:41 AM  Result Value Ref Range Status   MRSA by PCR NEGATIVE NEGATIVE Final    Comment:        The GeneXpert MRSA Assay (FDA approved for NASAL specimens only), is one component  of a comprehensive MRSA colonization surveillance program. It is not intended to diagnose MRSA infection nor to guide or monitor treatment for MRSA infections. Performed at [facility], [address], [place], [place] [address]      Scheduled Meds: . feeding supplement (ENSURE ENLIVE)  237 mL Oral BID BM  . ferrous sulfate  325 mg Oral BID WC  . ipratropium-albuterol  3 mL Nebulization TID  . nystatin   Topical BID  . pantoprazole  40 mg Oral Daily  . polyethylene glycol  17 g Oral Daily   Continuous Infusions: . sodium chloride 100 mL/hr at 12/11/18 0842  . ceFEPime (MAXIPIME) IV 2 g (12/11/18 0848)    Procedures/Studies: Dg Chest 2 View  Result Date: 12/10/2018 CLINICAL DATA:  Acute onset of confusion and decreased O2 saturation. Shortness of breath. EXAM: CHEST - 2 VIEW  COMPARISON:  Chest radiograph performed 12/01/2018 FINDINGS: The lungs are well-aerated. Right lower lobe airspace opacity is compatible with pneumonia. Peribronchial thickening is noted. There is no evidence of pleural effusion or pneumothorax. The heart is the borderline normal in size. No acute osseous abnormalities are seen. Mild chronic compression deformities are noted along the thoracic and upper lumbar spine. IMPRESSION: Right lower lobe pneumonia noted. Electronically Signed   By: Garald Balding M.D.   On: 12/10/2018 00:11   Dg Chest Portable 1 View  Result Date: 12/01/2018 CLINICAL DATA:  Altered mental status, COPD EXAM: PORTABLE CHEST 1 VIEW COMPARISON:  11/08/2018 FINDINGS: Stable cardiomegaly with vascular congestion and chronic appearing diffuse reticulonodular interstitial opacities, suspect chronic interstitial lung disease. Difficult to exclude interstitial edema. No effusion or pneumothorax. No significant collapse or consolidation. Aorta atherosclerotic. Bones are osteopenic. IMPRESSION: Cardiomegaly with chronic interstitial opacities, suspect chronic lung disease, less likely edema. Electronically Signed   By: Jerilynn Mages.  Shick M.D.   On: 12/01/2018 10:22    Orson Eva, DO  [facility] Pager [phone]  If 7PM-7AM, please contact night-coverage [email or web address] Password [email or web address] 12/11/2018, 5:19 PM   LOS: 1 day

## 2018-12-12 DIAGNOSIS — R4189 Other symptoms and signs involving cognitive functions and awareness: Secondary | ICD-10-CM | POA: Diagnosis not present

## 2018-12-12 DIAGNOSIS — J9602 Acute respiratory failure with hypercapnia: Secondary | ICD-10-CM | POA: Diagnosis not present

## 2018-12-12 DIAGNOSIS — J690 Pneumonitis due to inhalation of food and vomit: Secondary | ICD-10-CM

## 2018-12-12 DIAGNOSIS — R739 Hyperglycemia, unspecified: Secondary | ICD-10-CM | POA: Diagnosis not present

## 2018-12-12 DIAGNOSIS — J441 Chronic obstructive pulmonary disease with (acute) exacerbation: Secondary | ICD-10-CM | POA: Diagnosis not present

## 2018-12-12 DIAGNOSIS — I517 Cardiomegaly: Secondary | ICD-10-CM | POA: Diagnosis not present

## 2018-12-12 DIAGNOSIS — E43 Unspecified severe protein-calorie malnutrition: Secondary | ICD-10-CM | POA: Diagnosis not present

## 2018-12-12 DIAGNOSIS — I110 Hypertensive heart disease with heart failure: Secondary | ICD-10-CM | POA: Diagnosis not present

## 2018-12-12 DIAGNOSIS — I700 Atherosclerosis of aorta: Secondary | ICD-10-CM | POA: Diagnosis not present

## 2018-12-12 DIAGNOSIS — R0902 Hypoxemia: Secondary | ICD-10-CM | POA: Diagnosis not present

## 2018-12-12 DIAGNOSIS — I10 Essential (primary) hypertension: Secondary | ICD-10-CM | POA: Diagnosis not present

## 2018-12-12 DIAGNOSIS — R1310 Dysphagia, unspecified: Secondary | ICD-10-CM | POA: Diagnosis not present

## 2018-12-12 DIAGNOSIS — Z79899 Other long term (current) drug therapy: Secondary | ICD-10-CM | POA: Diagnosis not present

## 2018-12-12 DIAGNOSIS — E785 Hyperlipidemia, unspecified: Secondary | ICD-10-CM | POA: Diagnosis not present

## 2018-12-12 DIAGNOSIS — J189 Pneumonia, unspecified organism: Secondary | ICD-10-CM | POA: Diagnosis not present

## 2018-12-12 DIAGNOSIS — Z7401 Bed confinement status: Secondary | ICD-10-CM | POA: Diagnosis not present

## 2018-12-12 DIAGNOSIS — D649 Anemia, unspecified: Secondary | ICD-10-CM | POA: Diagnosis not present

## 2018-12-12 DIAGNOSIS — M6281 Muscle weakness (generalized): Secondary | ICD-10-CM | POA: Diagnosis not present

## 2018-12-12 DIAGNOSIS — G8929 Other chronic pain: Secondary | ICD-10-CM | POA: Diagnosis not present

## 2018-12-12 DIAGNOSIS — J449 Chronic obstructive pulmonary disease, unspecified: Secondary | ICD-10-CM | POA: Diagnosis not present

## 2018-12-12 DIAGNOSIS — J181 Lobar pneumonia, unspecified organism: Secondary | ICD-10-CM | POA: Diagnosis not present

## 2018-12-12 DIAGNOSIS — M8580 Other specified disorders of bone density and structure, unspecified site: Secondary | ICD-10-CM | POA: Diagnosis not present

## 2018-12-12 DIAGNOSIS — I5042 Chronic combined systolic (congestive) and diastolic (congestive) heart failure: Secondary | ICD-10-CM | POA: Diagnosis not present

## 2018-12-12 DIAGNOSIS — M4855XD Collapsed vertebra, not elsewhere classified, thoracolumbar region, subsequent encounter for fracture with routine healing: Secondary | ICD-10-CM | POA: Diagnosis not present

## 2018-12-12 DIAGNOSIS — A419 Sepsis, unspecified organism: Secondary | ICD-10-CM | POA: Diagnosis not present

## 2018-12-12 DIAGNOSIS — J9621 Acute and chronic respiratory failure with hypoxia: Secondary | ICD-10-CM | POA: Diagnosis not present

## 2018-12-12 DIAGNOSIS — D6959 Other secondary thrombocytopenia: Secondary | ICD-10-CM | POA: Diagnosis not present

## 2018-12-12 DIAGNOSIS — F419 Anxiety disorder, unspecified: Secondary | ICD-10-CM | POA: Diagnosis not present

## 2018-12-12 DIAGNOSIS — F339 Major depressive disorder, recurrent, unspecified: Secondary | ICD-10-CM | POA: Diagnosis not present

## 2018-12-12 LAB — CBC WITH DIFFERENTIAL/PLATELET
Abs Immature Granulocytes: 0.03 10*3/uL (ref 0.00–0.07)
Basophils Absolute: 0 10*3/uL (ref 0.0–0.1)
Basophils Relative: 0 %
Eosinophils Absolute: 0.4 10*3/uL (ref 0.0–0.5)
Eosinophils Relative: 6 %
HCT: 34.3 % — ABNORMAL LOW (ref 36.0–46.0)
HEMOGLOBIN: 10.1 g/dL — AB (ref 12.0–15.0)
Immature Granulocytes: 0 %
LYMPHS ABS: 1.1 10*3/uL (ref 0.7–4.0)
Lymphocytes Relative: 16 %
MCH: 28.9 pg (ref 26.0–34.0)
MCHC: 29.4 g/dL — AB (ref 30.0–36.0)
MCV: 98 fL (ref 80.0–100.0)
Monocytes Absolute: 0.8 10*3/uL (ref 0.1–1.0)
Monocytes Relative: 12 %
NRBC: 0 % (ref 0.0–0.2)
Neutro Abs: 4.7 10*3/uL (ref 1.7–7.7)
Neutrophils Relative %: 66 %
Platelets: 114 10*3/uL — ABNORMAL LOW (ref 150–400)
RBC: 3.5 MIL/uL — ABNORMAL LOW (ref 3.87–5.11)
RDW: 12.4 % (ref 11.5–15.5)
WBC: 7.1 10*3/uL (ref 4.0–10.5)

## 2018-12-12 LAB — BASIC METABOLIC PANEL
Anion gap: 5 (ref 5–15)
BUN: 11 mg/dL (ref 8–23)
CHLORIDE: 101 mmol/L (ref 98–111)
CO2: 34 mmol/L — AB (ref 22–32)
Calcium: 9 mg/dL (ref 8.9–10.3)
Creatinine, Ser: <0.3 mg/dL — ABNORMAL LOW (ref 0.44–1.00)
Glucose, Bld: 88 mg/dL (ref 70–99)
Potassium: 3.6 mmol/L (ref 3.5–5.1)
Sodium: 140 mmol/L (ref 135–145)

## 2018-12-12 LAB — PROCALCITONIN: Procalcitonin: <0.1 ng/mL

## 2018-12-12 MED ORDER — LEVOFLOXACIN 500 MG PO TABS
500.0000 mg | ORAL_TABLET | Freq: Every day | ORAL | Status: DC
  Administered 2018-12-12: 500 mg via ORAL
  Filled 2018-12-12: qty 1

## 2018-12-12 MED ORDER — LEVOFLOXACIN 500 MG PO TABS: 500.0000 mg | ORAL_TABLET | Freq: Every day | ORAL | 0 refills | Status: DC

## 2018-12-12 NOTE — Clinical Social Work Note (Signed)
Patient is a LTC resident at [facility].  LCSW advised Kayla at [facility] that patient was discharging today and sent discharge clinicals. LCSW spoke with son, Mykaila Blunck, and advised him of discharge.   LCSW signing off.    Acsa Estey, Clydene Pugh, LCSW

## 2018-12-12 NOTE — NC FL2 (Signed)
MEDICAID FL2 LEVEL OF CARE SCREENING TOOL     IDENTIFICATION  Patient Name: Tiffany Maxwell Birthdate: Dec 09, 1934 Sex: female Admission Date (Current Location): 12/09/2018  [facility] and [place] Number:  [employer] and Address:  [facility] [address] [place]      Provider Number: [id number]  Attending Physician Name and Address:  Orson Eva, MD  Relative Name and Phone Number:       Current Level of Care: Hospital Recommended Level of Care: [facility] Prior Approval Number:    Date Approved/Denied:   PASRR Number:    Discharge Plan: SNF    Current Diagnoses: Patient Active Problem List   Diagnosis Date Noted  . Aspiration pneumonitis ([place]) 12/12/2018  . Atrial fibrillation with slow ventricular response ([place]) 12/10/2018  . Sepsis due to undetermined organism ([place]) 12/10/2018  . Goals of care, counseling/discussion   . Palliative care by specialist   . DNR (do not resuscitate) discussion   . HCAP (healthcare-associated pneumonia)   . Lobar pneumonia ([place]) 06/27/2018  . COPD exacerbation ([place]) 06/26/2018  . Acute on chronic respiratory failure with hypoxia ([place]) 06/26/2018  . Primary insomnia 04/22/2018  . Hyperlipidemia 07/10/2017  . Chronic combined systolic (EF 89%) and diastolic CHF, NYHA class 1 ([place]) 07/10/2017  . COPD (chronic obstructive pulmonary disease) ([place]) 07/10/2017  . Closed comminuted intertrochanteric fracture of proximal end of right femur ([place]) 06/21/2017  . GAD (generalized anxiety disorder) 11/06/2016  . Back pain 04/08/2014  . Pulmonary emphysema ([place]) 01/30/2013  . History of tobacco use 01/30/2013  . Diastolic dysfunction 21/19/4174  . PNA (pneumonia) 01/29/2013  . Osteoporosis 01/29/2013  . Compression fracture of thoracic spine, non-traumatic ([place]) 01/28/2013  . HTN (hypertension) 01/28/2013    Orientation RESPIRATION BLADDER Height & Weight     Self, Place  O2(3L)  Incontinent Weight: 114 lb 13.8 oz (52.1 kg) Height:  5\' 1"  (154.9 cm)  BEHAVIORAL SYMPTOMS/MOOD NEUROLOGICAL BOWEL NUTRITION STATUS      Incontinent Diet(DYS 2 with thin liquids)  AMBULATORY STATUS COMMUNICATION OF NEEDS Skin   Limited Assist Verbally Normal                       Personal Care Assistance Level of Assistance  Bathing, Feeding, Dressing Bathing Assistance: Limited assistance Feeding assistance: Independent Dressing Assistance: Limited assistance     Functional Limitations Info  Sight, Hearing, Speech Sight Info: Adequate Hearing Info: Adequate Speech Info: Adequate    SPECIAL CARE FACTORS FREQUENCY                       Contractures Contractures Info: Not present    Additional Factors Info  Code Status, Allergies, Psychotropic Code Status Info: Full Code Allergies Info: Codeine, Penicillins, Benicar Hct, Medoxomil0hctz, Hctz, Naproxen Psychotropic Info: Xanax, Celexa, Remeron         Current Medications (12/12/2018):  This is the current hospital active medication list Current Facility-Administered Medications  Medication Dose Route Frequency Provider Last Rate Last Dose  . acetaminophen (TYLENOL) tablet 650 mg  650 mg Oral Q6H PRN Reubin Milan, MD       Or  . acetaminophen (TYLENOL) suppository 650 mg  650 mg Rectal Q6H PRN Reubin Milan, MD      . albuterol (PROVENTIL) (2.5 MG/3ML) 0.083% nebulizer solution 2.5 mg  2.5 mg Nebulization Q4H PRN Reubin Milan, MD      . ALPRAZolam Duanne Moron)  tablet 0.25 mg  0.25 mg Oral TID PRN Orson Eva, MD   0.25 mg at 12/11/18 0108  . feeding supplement (ENSURE ENLIVE) (ENSURE ENLIVE) liquid 237 mL  237 mL Oral BID BM Orson Eva, MD   237 mL at 12/12/18 0824  . ferrous sulfate tablet 325 mg  325 mg Oral BID WC Orson Eva, MD   325 mg at 12/12/18 0825  . ipratropium-albuterol (DUONEB) 0.5-2.5 (3) MG/3ML nebulizer solution 3 mL  3 mL Nebulization TID Orson Eva, MD   3 mL at 12/12/18 0742  .  levofloxacin (LEVAQUIN) tablet 500 mg  500 mg Oral Daily Tat, David, MD   500 mg at 12/12/18 0953  . nystatin (MYCOSTATIN/NYSTOP) topical powder   Topical BID Tat, David, MD      . ondansetron [facility]) tablet 4 mg  4 mg Oral Q6H PRN Reubin Milan, MD       Or  . ondansetron [facility]) injection 4 mg  4 mg Intravenous Q6H PRN Reubin Milan, MD      . pantoprazole (PROTONIX) EC tablet 40 mg  40 mg Oral Daily Orson Eva, MD   40 mg at 12/12/18 4193  . polyethylene glycol (MIRALAX / GLYCOLAX) packet 17 g  17 g Oral Daily Tat, David, MD   17 g at 12/12/18 7902     Discharge Medications: Please see discharge summary for a list of discharge medications.  Relevant Imaging Results:  Relevant Lab Results:   Additional Information    Abryanna Musolino, Clydene Pugh, LCSW

## 2018-12-12 NOTE — Care Management Important Message (Signed)
Important Message  Patient Details  Name: Tiffany Maxwell MRN: [id number] Date of Birth: 06/07/1934   Medicare Important Message Given:  Yes    Jakylah Bassinger, Chauncey Reading, RN 12/12/2018, 11:58 AM

## 2018-12-12 NOTE — Discharge Summary (Signed)
Physician Discharge Summary  PRESLY STEINRUCK BSJ:[id number] DOB: [birth year] DOA: 12/09/2018  PCP: [name], FNP  Admit date: 12/09/2018 Discharge date: 12/12/2018  Admitted From: SNF Disposition:  SNF  Recommendations for Outpatient Follow-up:  1. Follow up with PCP in 1-2 weeks 2. Please obtain BMP/CBC in one week    Discharge Condition: Stable CODE STATUS: FULL Diet recommendation: Dysphagia 2 with thin liquids   Brief/Interim Summary: 85 year old female with a history of chronic respiratory failure on 2 L, COPD, chronic pain, hypertension, systolic and diastolic CHF, hyperlipidemia, vertebral compression fracture and cognitive impairment presenting from [facility] secondary to hypoxia and confusion.  Apparently, the patient was given alprazolam 90 minutes before her onset of her confusion.  Because of the patient's cognitive impairment, she is unable to provide any significant history.  She does state that she has had intermittent shortness of breath and a nonproductive cough.  She denies any fevers, chills, chest pain, nausea, vomiting, diarrhea, abdominal pain.  Apparently, the patient was noted to have oxygen saturations in the mid 80s at her facility.  The patient was brought to emergency department for further evaluation.  At baseline, the patient is conversant able to feed herself.  Initial evaluation showed WBC 19.0 with chest x-ray showing right lower lobe opacity.  Patient was started on vancomycin and aztreonam initially.  Discharge Diagnoses:   Sepsis -Present at the time of presentation -Secondary to pneumonia -Presented with leukocytosis, acute on chronic respiratory failure, and altered mental status -Lactic acid peaked at 0.84 -Check procalcitonin <0.10 -Judicious IV fluids  Acute on chronic respiratory failure with hypoxia -Secondary to HCAP -Presently stable on 4 L>>weaned to 3L -Patient is on 2 L nasal cannula baseline -Wean oxygen for  saturation greater 92%  Lobar pneumonia/HCAP/Aspiration pneumonitis -Concern about possible aspiration -Speech therapy evaluation-->dysphagia 2, thin liquids -MRSA screening--neg -disContinue vancomycin -Started cefepime>>>d/c with levoflox x 4 more days to complete a week -Patient has tolerated cephalosporins in the past  Dysphagia -Speech therapy evaluation appreciated--> dysphagia 2 with thin liquids  Acute metabolic encephalopathy -Secondary to infectious process -Improving with antibiotics -At baseline, the patient is conversant and able to feed herself -UA negative for pyuria -sister at bedside states pt is improving, near baseline--she also notes continued cognitive decline over last several months  Chronic systolic and diastolic CHF -Appears clinically euvolemic -Daily weights -06/20/2017 echo EF 40%, grade 1 DD, trivial AI -Holding lisinopril and metoprolol succinate secondary to soft blood pressure  Essential hypertension -Holding lisinopril and metoprolol succinate secondary to soft blood pressure>>>restart as BP improved  Severe malnutrition -Continue Ensure  Anxiety -continue celexaand remeron  Thrombocytopenia -due to sepsis -improving -no signs of bleeding  Discharge Instructions   Allergies as of 12/12/2018      Reactions   Codeine Other (See Comments)   Headache   Penicillins    Has patient had a PCN reaction causing immediate rash, facial/tongue/throat swelling, SOB or lightheadedness with hypotension: Yes Has patient had a PCN reaction causing severe rash involving mucus membranes or skin necrosis: No Has patient had a PCN reaction that required hospitalization: Unknown Has patient had a PCN reaction occurring within the last 10 years: Unknown If all of the above answers are "NO", then may proceed with Cephalosporin use.   Benicar Hct [olmesartan Medoxomil-hctz] Swelling, Rash   Hctz [hydrochlorothiazide] Swelling, Rash   Naproxen  Itching, Rash, Other (See Comments)   Redness/flushing of skin      Medication List    STOP taking these medications  nitrofurantoin (macrocrystal-monohydrate) 100 MG capsule Commonly known as:  MACROBID   pantoprazole 40 MG tablet Commonly known as:  PROTONIX     TAKE these medications   Calcium Carbonate-Vitamin D 600-400 MG-UNIT tablet Commonly known as:  CALCIUM 600+D3 Take 1 tablet by mouth 2 (two) times daily.   citalopram 20 MG tablet Commonly known as:  CELEXA Take 1 tablet (20 mg total) by mouth daily.   feeding supplement (ENSURE ENLIVE) Liqd Take 237 mLs by mouth 2 (two) times daily between meals.   ferrous sulfate 325 (65 FE) MG tablet Take 1 tablet (325 mg total) by mouth 2 (two) times daily with a meal.   Ipratropium-Albuterol 20-100 MCG/ACT Aers respimat Commonly known as:  COMBIVENT RESPIMAT Inhale 1 puff into the lungs every 6 (six) hours as needed for wheezing.   levofloxacin 500 MG tablet Commonly known as:  LEVAQUIN Take 1 tablet (500 mg total) by mouth daily. X 4 days   lisinopril 20 MG tablet Commonly known as:  PRINIVIL,ZESTRIL Take 1 tablet (20 mg total) by mouth daily.   metoprolol succinate 25 MG 24 hr tablet Commonly known as:  TOPROL-XL Take 0.5 tablets (12.5 mg total) by mouth daily.   mirtazapine 15 MG tablet Commonly known as:  REMERON Take 1 tablet (15 mg total) by mouth at bedtime.   omeprazole 20 MG capsule Commonly known as:  PRILOSEC Take 20 mg by mouth daily.   OXYGEN Inhale 4 L into the lungs See admin instructions. TO MAINTAIN SATS OF 93% OR GREATER   polyethylene glycol powder powder Commonly known as:  GLYCOLAX/MIRALAX 17 GRAMS (1 CAPFUL) ONCE A DAY AS DIRECTED   VENTOLIN HFA 108 (90 Base) MCG/ACT inhaler Generic drug:  albuterol 2 PUFFS EVERY 6 HOURS AS NEEDED FOR WHEEZING OR SHORTNESS OF BREATH   vitamin C 500 MG tablet Commonly known as:  ASCORBIC ACID Take 1,000 mg by mouth 2 (two) times daily.   XANAX  0.25 MG tablet Generic drug:  ALPRAZolam Take 0.25 mg by mouth 3 (three) times daily.       Allergies  Allergen Reactions  . Codeine Other (See Comments)    Headache   . Penicillins     Has patient had a PCN reaction causing immediate rash, facial/tongue/throat swelling, SOB or lightheadedness with hypotension: Yes Has patient had a PCN reaction causing severe rash involving mucus membranes or skin necrosis: No Has patient had a PCN reaction that required hospitalization: Unknown Has patient had a PCN reaction occurring within the last 10 years: Unknown If all of the above answers are "NO", then may proceed with Cephalosporin use.  Marland Kitchen Benicar Hct [Olmesartan Medoxomil-Hctz] Swelling and Rash  . Hctz [Hydrochlorothiazide] Swelling and Rash  . Naproxen Itching, Rash and Other (See Comments)    Redness/flushing of skin    Consultations:  none   Procedures/Studies: Dg Chest 2 View  Result Date: 12/10/2018 CLINICAL DATA:  Acute onset of confusion and decreased O2 saturation. Shortness of breath. EXAM: CHEST - 2 VIEW COMPARISON:  Chest radiograph performed 12/01/2018 FINDINGS: The lungs are well-aerated. Right lower lobe airspace opacity is compatible with pneumonia. Peribronchial thickening is noted. There is no evidence of pleural effusion or pneumothorax. The heart is the borderline normal in size. No acute osseous abnormalities are seen. Mild chronic compression deformities are noted along the thoracic and upper lumbar spine. IMPRESSION: Right lower lobe pneumonia noted. Electronically Signed   By: Garald Balding M.D.   On: 12/10/2018 00:11   Dg Chest  Portable 1 View  Result Date: 12/01/2018 CLINICAL DATA:  Altered mental status, COPD EXAM: PORTABLE CHEST 1 VIEW COMPARISON:  11/08/2018 FINDINGS: Stable cardiomegaly with vascular congestion and chronic appearing diffuse reticulonodular interstitial opacities, suspect chronic interstitial lung disease. Difficult to exclude  interstitial edema. No effusion or pneumothorax. No significant collapse or consolidation. Aorta atherosclerotic. Bones are osteopenic. IMPRESSION: Cardiomegaly with chronic interstitial opacities, suspect chronic lung disease, less likely edema. Electronically Signed   By: Jerilynn Mages.  Shick M.D.   On: 12/01/2018 10:22         Discharge Exam: Vitals:   12/12/18 0549 12/12/18 0744  BP: (!) 176/98   Pulse: 67   Resp:    Temp: 97.7 F (36.5 C)   SpO2: 94% 94%   Vitals:   12/11/18 2051 12/11/18 2109 12/12/18 0549 12/12/18 0744  BP:  138/75 (!) 176/98   Pulse:  71 67   Resp:      Temp:  98.4 F (36.9 C) 97.7 F (36.5 C)   TempSrc:  Oral Oral   SpO2: 96% 96% 94% 94%  Weight:      Height:        General: Pt is alert, awake, not in acute distress Cardiovascular: RRR, S1/S2 +, no rubs, no gallops Respiratory: Scattered bilateral rales.  No wheezing.  Good air movement. Abdominal: Soft, NT, ND, bowel sounds + Extremities: no edema, no cyanosis   The results of significant diagnostics from this hospitalization (including imaging, microbiology, ancillary and laboratory) are listed below for reference.    Significant Diagnostic Studies: Dg Chest 2 View  Result Date: 12/10/2018 CLINICAL DATA:  Acute onset of confusion and decreased O2 saturation. Shortness of breath. EXAM: CHEST - 2 VIEW COMPARISON:  Chest radiograph performed 12/01/2018 FINDINGS: The lungs are well-aerated. Right lower lobe airspace opacity is compatible with pneumonia. Peribronchial thickening is noted. There is no evidence of pleural effusion or pneumothorax. The heart is the borderline normal in size. No acute osseous abnormalities are seen. Mild chronic compression deformities are noted along the thoracic and upper lumbar spine. IMPRESSION: Right lower lobe pneumonia noted. Electronically Signed   By: Garald Balding M.D.   On: 12/10/2018 00:11   Dg Chest Portable 1 View  Result Date: 12/01/2018 CLINICAL DATA:   Altered mental status, COPD EXAM: PORTABLE CHEST 1 VIEW COMPARISON:  11/08/2018 FINDINGS: Stable cardiomegaly with vascular congestion and chronic appearing diffuse reticulonodular interstitial opacities, suspect chronic interstitial lung disease. Difficult to exclude interstitial edema. No effusion or pneumothorax. No significant collapse or consolidation. Aorta atherosclerotic. Bones are osteopenic. IMPRESSION: Cardiomegaly with chronic interstitial opacities, suspect chronic lung disease, less likely edema. Electronically Signed   By: Jerilynn Mages.  Shick M.D.   On: 12/01/2018 10:22     Microbiology: Recent Results (from the past 240 hour(s))  Blood Culture (routine x 2)     Status: None (Preliminary result)   Collection Time: 12/10/18 12:47 AM  Result Value Ref Range Status   Specimen Description BLOOD RIGHT ANTECUBITAL  Final   Special Requests   Final    BOTTLES DRAWN AEROBIC AND ANAEROBIC Blood Culture results may not be optimal due to an excessive volume of blood received in culture bottles   Culture   Final    NO GROWTH 2 DAYS Performed at [facility], [address], [place], [place] [address]    Report Status PENDING  Incomplete  Blood Culture (routine x 2)     Status: None (Preliminary result)   Collection Time: 12/10/18 12:49 AM  Result  Value Ref Range Status   Specimen Description BLOOD LEFT FOREARM  Final   Special Requests   Final    BOTTLES DRAWN AEROBIC AND ANAEROBIC Blood Culture adequate volume   Culture   Final    NO GROWTH 2 DAYS Performed at [facility], [address], [place], [place] [address]    Report Status PENDING  Incomplete  MRSA PCR Screening     Status: None   Collection Time: 12/10/18  9:41 AM  Result Value Ref Range Status   MRSA by PCR NEGATIVE NEGATIVE Final    Comment:        The GeneXpert MRSA Assay (FDA approved for NASAL specimens only), is one component of a comprehensive MRSA colonization surveillance program. It is not intended to diagnose  MRSA infection nor to guide or monitor treatment for MRSA infections. Performed at [facility], [address], [place], [place] [address]      Labs: Basic Metabolic Panel: Recent Labs  Lab 12/10/18 0008 12/10/18 0436 12/11/18 0439 12/12/18 0523  NA 135 136 140 140  K 3.8 4.3 3.7 3.6  CL 91* 97* 103 101  CO2 39* 34* 35* 34*  GLUCOSE 113* 121* 89 88  BUN 12 12 14 11   CREATININE 0.51 0.45 0.31* <0.30*  CALCIUM 9.7 9.1 9.2 9.0  MG 1.8  --   --   --   PHOS 2.7  --   --   --    Liver Function Tests: No results for input(s): AST, ALT, ALKPHOS, BILITOT, PROT, ALBUMIN in the last 168 hours. No results for input(s): LIPASE, AMYLASE in the last 168 hours. No results for input(s): AMMONIA in the last 168 hours. CBC: Recent Labs  Lab 12/10/18 0008 12/10/18 0436 12/11/18 0439 12/12/18 0523  WBC 19.0* 13.8* 8.0 7.1  NEUTROABS 15.3* 12.3* 5.6 4.7  HGB 11.8* 11.4* 10.4* 10.1*  HCT 40.0 39.0 35.0* 34.3*  MCV 96.6 99.5 98.9 98.0  PLT 131* 107* 102* 114*   Cardiac Enzymes: No results for input(s): CKTOTAL, CKMB, CKMBINDEX, TROPONINI in the last 168 hours. BNP: Invalid input(s): POCBNP CBG: No results for input(s): GLUCAP in the last 168 hours.  Time coordinating discharge:  36 minutes  Signed:  Orson Eva, DO [facility] Pager: [phone] 12/12/2018, 8:46 AM

## 2018-12-12 NOTE — Progress Notes (Signed)
Report called and given to [facility] at [facility]. Patient's IV removed, WNL. Awaiting EMS for transport.

## 2018-12-13 ENCOUNTER — Ambulatory Visit: Payer: Self-pay | Admitting: *Deleted

## 2018-12-13 DIAGNOSIS — J181 Lobar pneumonia, unspecified organism: Secondary | ICD-10-CM

## 2018-12-13 DIAGNOSIS — I4891 Unspecified atrial fibrillation: Secondary | ICD-10-CM

## 2018-12-13 DIAGNOSIS — A419 Sepsis, unspecified organism: Secondary | ICD-10-CM

## 2018-12-13 DIAGNOSIS — J449 Chronic obstructive pulmonary disease, unspecified: Secondary | ICD-10-CM

## 2018-12-13 DIAGNOSIS — N390 Urinary tract infection, site not specified: Secondary | ICD-10-CM

## 2018-12-13 DIAGNOSIS — I5189 Other ill-defined heart diseases: Secondary | ICD-10-CM

## 2018-12-13 DIAGNOSIS — I10 Essential (primary) hypertension: Secondary | ICD-10-CM

## 2018-12-13 NOTE — Chronic Care Management (AMB) (Signed)
  Chronic Care Management   Note  12/13/2018 Name: Tiffany Maxwell MRN: [id number] DOB: 03-Apr-1934  [name] is an 85 year old female patient of [facility]. [name] was recently discahrged from the hospital after being admitted for UTI, pneumonia, and sepsis. She has had four ED visits within the past 6 months and two of those ended with admissions. I would like to discuss CCM services with [name].   Plan I left a message on her home voicemail and I will attempt to call her again within 2 business days.   Chong Sicilian, RN-BC, BSN Nurse Case Manager [facility] Ph: [phone]

## 2018-12-15 LAB — CULTURE, BLOOD (ROUTINE X 2)
CULTURE: NO GROWTH
CULTURE: NO GROWTH
Special Requests: ADEQUATE

## 2018-12-25 DIAGNOSIS — J441 Chronic obstructive pulmonary disease with (acute) exacerbation: Secondary | ICD-10-CM | POA: Diagnosis not present

## 2018-12-25 DIAGNOSIS — J9602 Acute respiratory failure with hypercapnia: Secondary | ICD-10-CM | POA: Diagnosis not present

## 2018-12-25 DIAGNOSIS — M6281 Muscle weakness (generalized): Secondary | ICD-10-CM | POA: Diagnosis not present

## 2019-01-11 DIAGNOSIS — J181 Lobar pneumonia, unspecified organism: Secondary | ICD-10-CM | POA: Diagnosis not present

## 2019-01-11 IMAGING — DX DG FEMUR 2+V PORT*R*
4 series · 4 of 4 positions shown · non-contrast
Comparison: None.

CLINICAL DATA: Intertrochanteric fracture of proximal femur.

EXAM:
RIGHT FEMUR PORTABLE 2 VIEW

[femur ap (1 of 2)]
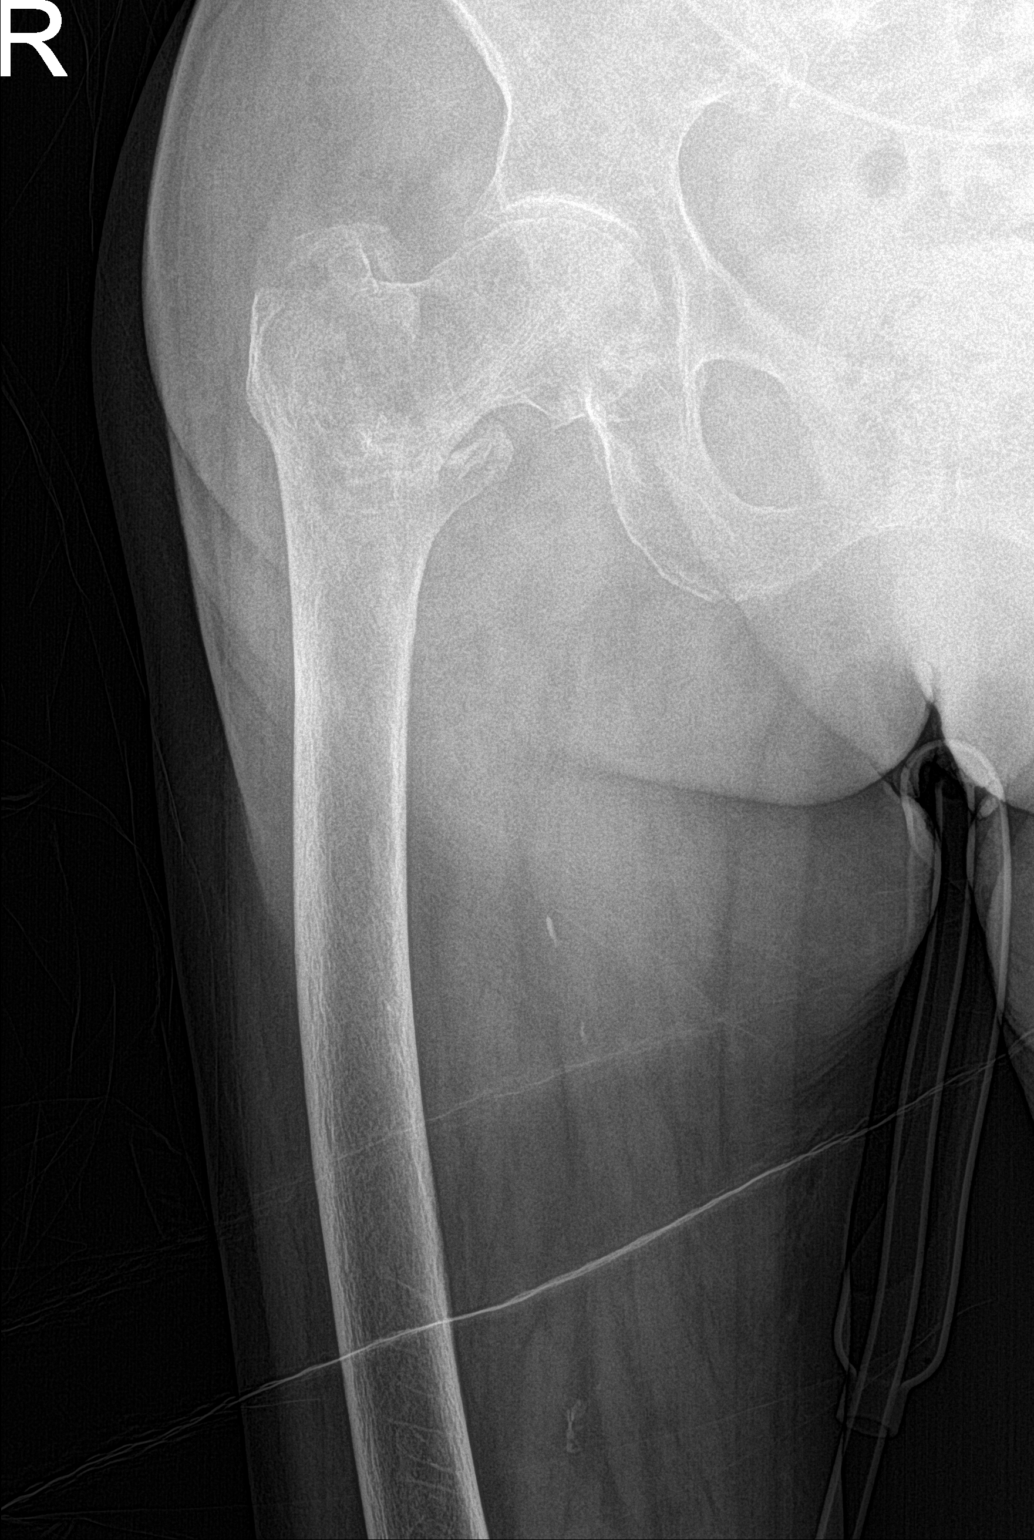

[femur ap (2 of 2)]
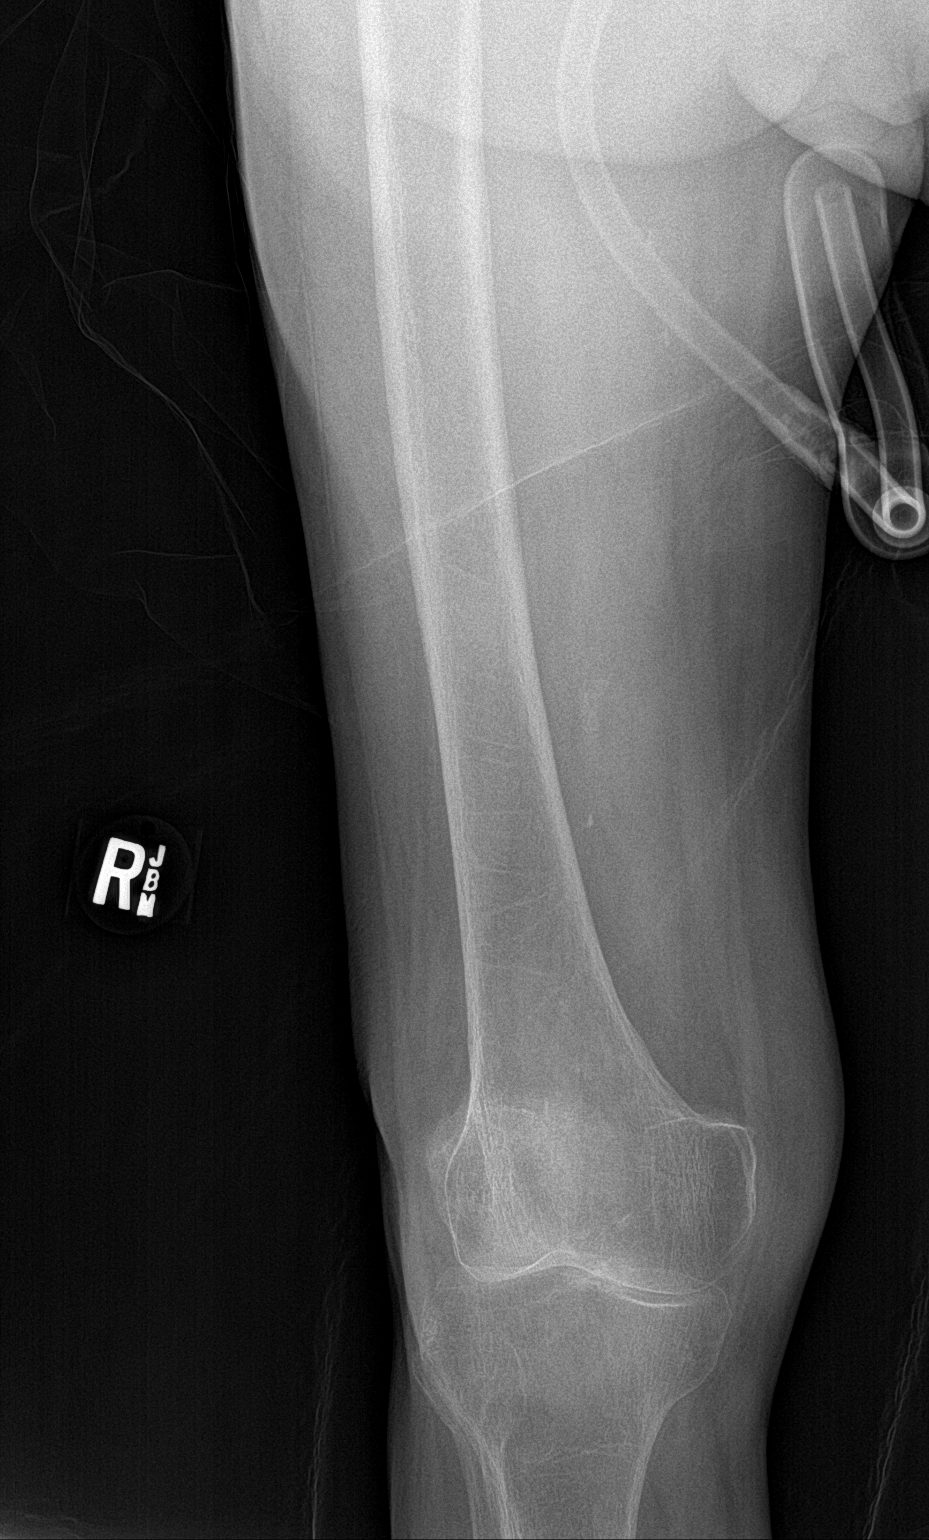

[femur lat (1 of 2)]
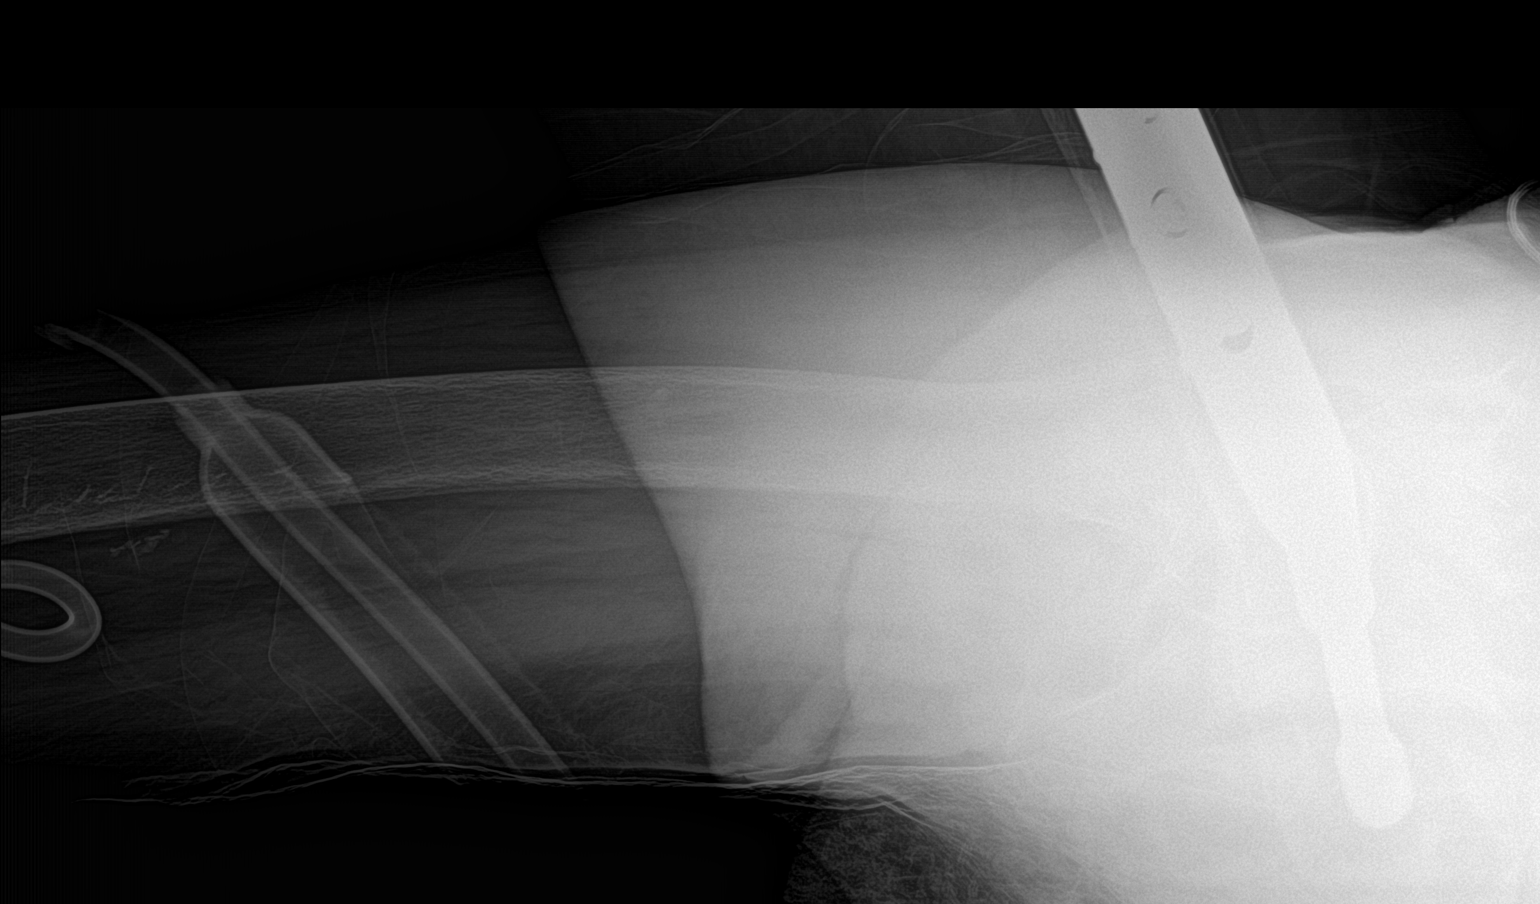

[femur lat (2 of 2)]
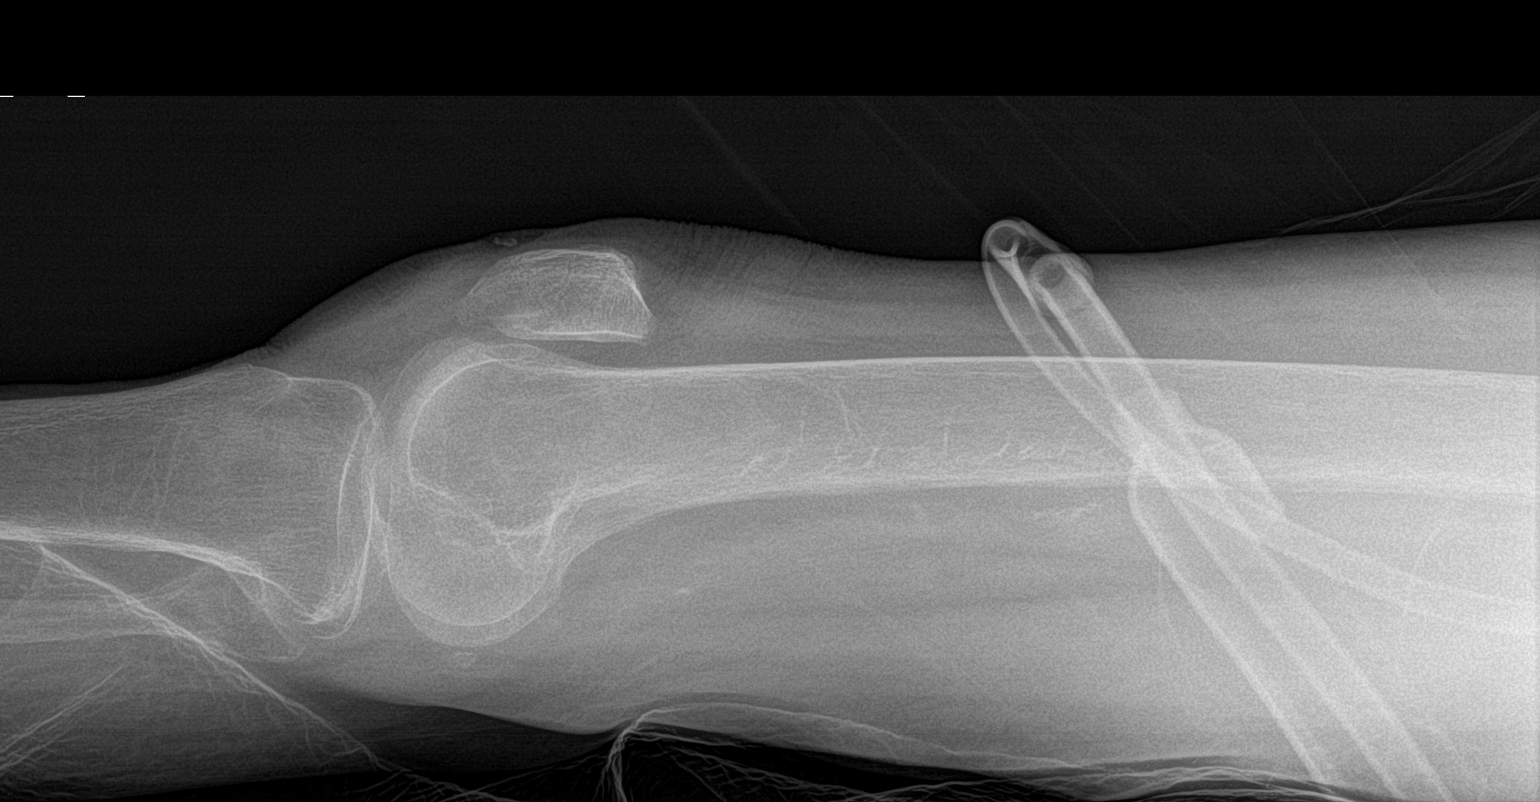

[4 of 4 positions shown; findings below may reference images not displayed]

FINDINGS: Mildly displaced and possibly comminuted fracture is seen involving
the intertrochanteric region of proximal right femur. No fracture is
seen involving the middle or distal aspects of the right femur.
IMPRESSION: Displaced and possibly comminuted intertrochanteric fracture of
proximal right femur. No other abnormality seen in the right femur.

## 2019-01-12 DIAGNOSIS — J181 Lobar pneumonia, unspecified organism: Secondary | ICD-10-CM | POA: Diagnosis not present

## 2019-01-13 DIAGNOSIS — J181 Lobar pneumonia, unspecified organism: Secondary | ICD-10-CM | POA: Diagnosis not present

## 2019-01-14 DIAGNOSIS — J181 Lobar pneumonia, unspecified organism: Secondary | ICD-10-CM | POA: Diagnosis not present

## 2019-01-15 DIAGNOSIS — J181 Lobar pneumonia, unspecified organism: Secondary | ICD-10-CM | POA: Diagnosis not present

## 2019-01-17 DIAGNOSIS — J181 Lobar pneumonia, unspecified organism: Secondary | ICD-10-CM | POA: Diagnosis not present

## 2019-01-20 DIAGNOSIS — J181 Lobar pneumonia, unspecified organism: Secondary | ICD-10-CM | POA: Diagnosis not present

## 2019-01-21 DIAGNOSIS — J181 Lobar pneumonia, unspecified organism: Secondary | ICD-10-CM | POA: Diagnosis not present

## 2019-01-22 DIAGNOSIS — J181 Lobar pneumonia, unspecified organism: Secondary | ICD-10-CM | POA: Diagnosis not present

## 2019-01-23 DIAGNOSIS — J181 Lobar pneumonia, unspecified organism: Secondary | ICD-10-CM | POA: Diagnosis not present

## 2019-01-25 DIAGNOSIS — J441 Chronic obstructive pulmonary disease with (acute) exacerbation: Secondary | ICD-10-CM | POA: Diagnosis not present

## 2019-01-25 DIAGNOSIS — I10 Essential (primary) hypertension: Secondary | ICD-10-CM | POA: Diagnosis not present

## 2019-01-25 DIAGNOSIS — I5042 Chronic combined systolic (congestive) and diastolic (congestive) heart failure: Secondary | ICD-10-CM | POA: Diagnosis not present

## 2019-01-25 DIAGNOSIS — R634 Abnormal weight loss: Secondary | ICD-10-CM | POA: Diagnosis not present

## 2019-01-30 DIAGNOSIS — F339 Major depressive disorder, recurrent, unspecified: Secondary | ICD-10-CM | POA: Diagnosis not present

## 2019-01-30 DIAGNOSIS — J441 Chronic obstructive pulmonary disease with (acute) exacerbation: Secondary | ICD-10-CM | POA: Diagnosis not present

## 2019-01-30 DIAGNOSIS — F419 Anxiety disorder, unspecified: Secondary | ICD-10-CM | POA: Diagnosis not present

## 2019-01-30 DIAGNOSIS — K210 Gastro-esophageal reflux disease with esophagitis: Secondary | ICD-10-CM | POA: Diagnosis not present

## 2019-03-11 ENCOUNTER — Encounter (HOSPITAL_COMMUNITY): Payer: Self-pay | Admitting: Emergency Medicine

## 2019-03-11 ENCOUNTER — Other Ambulatory Visit: Payer: Self-pay

## 2019-03-11 ENCOUNTER — Inpatient Hospital Stay (HOSPITAL_COMMUNITY)
Admission: EM | Admit: 2019-03-11 | Discharge: 2019-03-14 | DRG: 689 | Disposition: A | Discharge status: Skilled Nursing Facility | Payer: Medicare Other | Attending: Internal Medicine | Admitting: Internal Medicine

## 2019-03-11 ENCOUNTER — Emergency Department (HOSPITAL_COMMUNITY): Payer: Medicare Other

## 2019-03-11 DIAGNOSIS — Z886 Allergy status to analgesic agent status: Secondary | ICD-10-CM

## 2019-03-11 DIAGNOSIS — Z880 Allergy status to penicillin: Secondary | ICD-10-CM

## 2019-03-11 DIAGNOSIS — M810 Age-related osteoporosis without current pathological fracture: Secondary | ICD-10-CM | POA: Diagnosis present

## 2019-03-11 DIAGNOSIS — I499 Cardiac arrhythmia, unspecified: Secondary | ICD-10-CM | POA: Diagnosis not present

## 2019-03-11 DIAGNOSIS — N390 Urinary tract infection, site not specified: Secondary | ICD-10-CM | POA: Diagnosis not present

## 2019-03-11 DIAGNOSIS — E860 Dehydration: Secondary | ICD-10-CM | POA: Diagnosis not present

## 2019-03-11 DIAGNOSIS — R404 Transient alteration of awareness: Secondary | ICD-10-CM | POA: Diagnosis not present

## 2019-03-11 DIAGNOSIS — Z8249 Family history of ischemic heart disease and other diseases of the circulatory system: Secondary | ICD-10-CM

## 2019-03-11 DIAGNOSIS — I130 Hypertensive heart and chronic kidney disease with heart failure and stage 1 through stage 4 chronic kidney disease, or unspecified chronic kidney disease: Secondary | ICD-10-CM | POA: Diagnosis present

## 2019-03-11 DIAGNOSIS — I6782 Cerebral ischemia: Secondary | ICD-10-CM | POA: Diagnosis not present

## 2019-03-11 DIAGNOSIS — R0902 Hypoxemia: Secondary | ICD-10-CM | POA: Diagnosis not present

## 2019-03-11 DIAGNOSIS — Z66 Do not resuscitate: Secondary | ICD-10-CM | POA: Diagnosis present

## 2019-03-11 DIAGNOSIS — R1310 Dysphagia, unspecified: Secondary | ICD-10-CM | POA: Diagnosis present

## 2019-03-11 DIAGNOSIS — Z888 Allergy status to other drugs, medicaments and biological substances status: Secondary | ICD-10-CM

## 2019-03-11 DIAGNOSIS — R0689 Other abnormalities of breathing: Secondary | ICD-10-CM | POA: Diagnosis not present

## 2019-03-11 DIAGNOSIS — I5042 Chronic combined systolic (congestive) and diastolic (congestive) heart failure: Secondary | ICD-10-CM | POA: Diagnosis not present

## 2019-03-11 DIAGNOSIS — L821 Other seborrheic keratosis: Secondary | ICD-10-CM | POA: Diagnosis present

## 2019-03-11 DIAGNOSIS — F0391 Unspecified dementia with behavioral disturbance: Secondary | ICD-10-CM | POA: Diagnosis not present

## 2019-03-11 DIAGNOSIS — Z9981 Dependence on supplemental oxygen: Secondary | ICD-10-CM

## 2019-03-11 DIAGNOSIS — J181 Lobar pneumonia, unspecified organism: Secondary | ICD-10-CM | POA: Diagnosis not present

## 2019-03-11 DIAGNOSIS — I252 Old myocardial infarction: Secondary | ICD-10-CM

## 2019-03-11 DIAGNOSIS — J9611 Chronic respiratory failure with hypoxia: Secondary | ICD-10-CM | POA: Diagnosis not present

## 2019-03-11 DIAGNOSIS — J449 Chronic obstructive pulmonary disease, unspecified: Secondary | ICD-10-CM | POA: Diagnosis present

## 2019-03-11 DIAGNOSIS — K219 Gastro-esophageal reflux disease without esophagitis: Secondary | ICD-10-CM | POA: Diagnosis present

## 2019-03-11 DIAGNOSIS — J984 Other disorders of lung: Secondary | ICD-10-CM | POA: Diagnosis not present

## 2019-03-11 DIAGNOSIS — N183 Chronic kidney disease, stage 3 (moderate): Secondary | ICD-10-CM | POA: Diagnosis present

## 2019-03-11 DIAGNOSIS — I517 Cardiomegaly: Secondary | ICD-10-CM | POA: Diagnosis not present

## 2019-03-11 DIAGNOSIS — J432 Centrilobular emphysema: Secondary | ICD-10-CM | POA: Diagnosis not present

## 2019-03-11 DIAGNOSIS — R4182 Altered mental status, unspecified: Secondary | ICD-10-CM | POA: Diagnosis not present

## 2019-03-11 DIAGNOSIS — B961 Klebsiella pneumoniae [K. pneumoniae] as the cause of diseases classified elsewhere: Secondary | ICD-10-CM | POA: Diagnosis present

## 2019-03-11 DIAGNOSIS — G92 Toxic encephalopathy: Secondary | ICD-10-CM | POA: Diagnosis not present

## 2019-03-11 DIAGNOSIS — Z87891 Personal history of nicotine dependence: Secondary | ICD-10-CM

## 2019-03-11 DIAGNOSIS — E785 Hyperlipidemia, unspecified: Secondary | ICD-10-CM | POA: Diagnosis present

## 2019-03-11 DIAGNOSIS — I10 Essential (primary) hypertension: Secondary | ICD-10-CM | POA: Diagnosis present

## 2019-03-11 DIAGNOSIS — I491 Atrial premature depolarization: Secondary | ICD-10-CM | POA: Diagnosis present

## 2019-03-11 DIAGNOSIS — Z885 Allergy status to narcotic agent status: Secondary | ICD-10-CM

## 2019-03-11 HISTORY — DX: Other symptoms and signs involving cognitive functions and awareness: R41.89

## 2019-03-11 HISTORY — DX: Developmental disorder of speech and language, unspecified: F80.9

## 2019-03-11 HISTORY — DX: Do not resuscitate: Z66

## 2019-03-11 HISTORY — DX: Dependence on supplemental oxygen: Z99.81

## 2019-03-11 LAB — CBC WITH DIFFERENTIAL/PLATELET
Abs Immature Granulocytes: 0.03 10*3/uL (ref 0.00–0.07)
BASOS PCT: 0 %
Basophils Absolute: 0 10*3/uL (ref 0.0–0.1)
Eosinophils Absolute: 0.1 10*3/uL (ref 0.0–0.5)
Eosinophils Relative: 1 %
HCT: 35.1 % — ABNORMAL LOW (ref 36.0–46.0)
Hemoglobin: 11.4 g/dL — ABNORMAL LOW (ref 12.0–15.0)
Immature Granulocytes: 0 %
Lymphocytes Relative: 8 %
Lymphs Abs: 0.8 10*3/uL (ref 0.7–4.0)
MCH: 29.9 pg (ref 26.0–34.0)
MCHC: 32.5 g/dL (ref 30.0–36.0)
MCV: 92.1 fL (ref 80.0–100.0)
Monocytes Absolute: 0.6 10*3/uL (ref 0.1–1.0)
Monocytes Relative: 6 %
NRBC: 0 % (ref 0.0–0.2)
Neutro Abs: 8.7 10*3/uL — ABNORMAL HIGH (ref 1.7–7.7)
Neutrophils Relative %: 85 %
Platelets: 144 10*3/uL — ABNORMAL LOW (ref 150–400)
RBC: 3.81 MIL/uL — AB (ref 3.87–5.11)
RDW: 11.7 % (ref 11.5–15.5)
WBC: 10.2 10*3/uL (ref 4.0–10.5)

## 2019-03-11 LAB — URINALYSIS, ROUTINE W REFLEX MICROSCOPIC
Bilirubin Urine: NEGATIVE
Glucose, UA: NEGATIVE mg/dL
KETONES UR: NEGATIVE mg/dL
Nitrite: NEGATIVE
Protein, ur: 30 mg/dL — AB
Specific Gravity, Urine: 1.015 (ref 1.005–1.030)
WBC, UA: >50 WBC/hpf — ABNORMAL HIGH (ref 0–5)
pH: 6 (ref 5.0–8.0)

## 2019-03-11 LAB — COMPREHENSIVE METABOLIC PANEL
ALBUMIN: 3.3 g/dL — AB (ref 3.5–5.0)
ALT: 7 U/L (ref 0–44)
AST: 22 U/L (ref 15–41)
Alkaline Phosphatase: 68 U/L (ref 38–126)
Anion gap: 7 (ref 5–15)
BUN: 11 mg/dL (ref 8–23)
CHLORIDE: 87 mmol/L — AB (ref 98–111)
CO2: 41 mmol/L — ABNORMAL HIGH (ref 22–32)
Calcium: 9.8 mg/dL (ref 8.9–10.3)
Creatinine, Ser: 0.38 mg/dL — ABNORMAL LOW (ref 0.44–1.00)
GFR calc Af Amer: >60 mL/min (ref >60)
GFR calc non Af Amer: >60 mL/min (ref >60)
Glucose, Bld: 112 mg/dL — ABNORMAL HIGH (ref 70–99)
POTASSIUM: 4.2 mmol/L (ref 3.5–5.1)
Sodium: 135 mmol/L (ref 135–145)
Total Bilirubin: 0.2 mg/dL — ABNORMAL LOW (ref 0.3–1.2)
Total Protein: 6.6 g/dL (ref 6.5–8.1)

## 2019-03-11 LAB — LACTIC ACID, PLASMA
Lactic Acid, Venous: 0.7 mmol/L (ref 0.5–1.9)
Lactic Acid, Venous: 0.6 mmol/L (ref 0.5–1.9)

## 2019-03-11 LAB — PROTIME-INR
INR: 0.9 (ref 0.8–1.2)
Prothrombin Time: 12 seconds (ref 11.4–15.2)

## 2019-03-11 LAB — TROPONIN I: Troponin I: <0.03 ng/mL (ref <0.03)

## 2019-03-11 LAB — PROCALCITONIN: Procalcitonin: <0.1 ng/mL

## 2019-03-11 LAB — BRAIN NATRIURETIC PEPTIDE: B Natriuretic Peptide: 144 pg/mL — ABNORMAL HIGH (ref 0.0–100.0)

## 2019-03-11 MED ORDER — SODIUM CHLORIDE 0.9 % IV SOLN
INTRAVENOUS | Status: DC
  Administered 2019-03-11: 20:00:00 via INTRAVENOUS

## 2019-03-11 MED ORDER — CIPROFLOXACIN IN D5W 400 MG/200ML IV SOLN
400.0000 mg | Freq: Once | INTRAVENOUS | Status: AC
  Administered 2019-03-11: 400 mg via INTRAVENOUS
  Filled 2019-03-11: qty 200

## 2019-03-11 NOTE — ED Triage Notes (Signed)
Pt from [facility] called for stroke, upon arrival EMS was able to rule out stroke with no stroke symptoms, LKW 1500 today, pt on 4L [place] continuous, v/s WNL, CBG was 131, EMS reports smell of foul urine when moved and feces on pt, pt oriented x 2

## 2019-03-11 NOTE — ED Provider Notes (Signed)
[facility] EMERGENCY DEPARTMENT Provider Note   CSN: [id number] Arrival date & time: 03/11/19  1943    History   Chief Complaint Chief Complaint  Patient presents with  . Altered Mental Status    HPI Tiffany Maxwell is a 85 y.o. female.     The history is provided by the EMS personnel, the nursing home and the patient. The history is limited by the condition of the patient (hx cognitive deficits).  Altered Mental Status  Pt was seen at 2000. Per EMS and NH report: EMS called to NH for "stroke." EMS states pt did not have stroke symptoms on their arrival to scene. Last seen by NH staff at 1500 today (LKW). EMS states pt had foul smelling urine when moved and feces was on pt. Pt's family that called ED states they were called and told "pt was dying." Pt herself denies CP, abd pain, SOB when asked. No reported fevers, vomiting/diarrhea.     Past Medical History:  Diagnosis Date  . CHF (congestive heart failure) ([place])   . Cholelithiasis 01/30/2013  . Cognitive impairment   . Communication deficit   . Compression fracture of lumbar spine, non-traumatic ([place]) 04/09/2014  . COPD (chronic obstructive pulmonary disease) ([place])   . Diastolic dysfunction 0/73/7106   Grade 1. Ejection fraction 65%.  . DNR (do not resuscitate)   . Hip fracture ([place]) 06/19/2017  . History of tobacco use 01/30/2013  . Hyperlipidemia   . Hypertension   . MI (myocardial infarction) ([place])    pt unaware  . Multiple fractures of thoracic spine, closed ([place]) 01/30/2013  . On home O2   . Osteoporosis   . PAC (premature atrial contraction)   . Palpitations   . Pulmonary emphysema ([place]) 01/30/2013  . Seborrheic keratosis     Patient Active Problem List   Diagnosis Date Noted  . Aspiration pneumonitis ([place]) 12/12/2018  . Atrial fibrillation with slow ventricular response ([place]) 12/10/2018  . Sepsis due to undetermined organism ([place]) 12/10/2018  . Goals of care, counseling/discussion   . Palliative care  by specialist   . DNR (do not resuscitate) discussion   . HCAP (healthcare-associated pneumonia)   . Lobar pneumonia ([place]) 06/27/2018  . COPD exacerbation ([place]) 06/26/2018  . Acute on chronic respiratory failure with hypoxia ([place]) 06/26/2018  . Primary insomnia 04/22/2018  . Hyperlipidemia 07/10/2017  . Chronic combined systolic (EF 26%) and diastolic CHF, NYHA class 1 ([place]) 07/10/2017  . COPD (chronic obstructive pulmonary disease) ([place]) 07/10/2017  . Closed comminuted intertrochanteric fracture of proximal end of right femur ([place]) 06/21/2017  . GAD (generalized anxiety disorder) 11/06/2016  . Back pain 04/08/2014  . Pulmonary emphysema ([place]) 01/30/2013  . History of tobacco use 01/30/2013  . Diastolic dysfunction 94/85/4627  . PNA (pneumonia) 01/29/2013  . Osteoporosis 01/29/2013  . Compression fracture of thoracic spine, non-traumatic ([place]) 01/28/2013  . HTN (hypertension) 01/28/2013    Past Surgical History:  Procedure Laterality Date  . COMPRESSION HIP SCREW Left 04/29/2013   Procedure: COMPRESSION HIP;  Surgeon: Sanjuana Kava, MD;  Location: [facility];  Service: Orthopedics;  Laterality: Left;  [place]  . ESOPHAGOGASTRODUODENOSCOPY (EGD) WITH PROPOFOL N/A 07/11/2017   Procedure: ESOPHAGOGASTRODUODENOSCOPY (EGD) WITH PROPOFOL;  Surgeon: Ronald Lobo, MD;  Location: [place];  Service: Endoscopy;  Laterality: N/A;  . FEMUR IM NAIL Right 06/20/2017   Procedure: INTRAMEDULLARY (IM) NAIL FEMORAL;  Surgeon: Rod Can, MD;  Location: [place];  Service: Orthopedics;  Laterality: Right;  . TUBAL  LIGATION       OB History   No obstetric history on file.      Home Medications    Prior to Admission medications   Medication Sig Start Date End Date Taking? Authorizing Provider  ALPRAZolam (XANAX) 0.25 MG tablet Take 0.25 mg by mouth 3 (three) times daily.  05/20/18 05/20/19  [provider]  Calcium Carbonate-Vitamin D (CALCIUM 600+D3) 600-400 MG-UNIT per tablet  Take 1 tablet by mouth 2 (two) times daily. 04/09/14   Rexene Alberts, MD  citalopram (CELEXA) 20 MG tablet Take 1 tablet (20 mg total) by mouth daily. 04/22/18   Hassell Done Mary-Margaret, FNP  feeding supplement, ENSURE ENLIVE, (ENSURE ENLIVE) LIQD Take 237 mLs by mouth 2 (two) times daily between meals. Patient not taking: Reported on 12/01/2018 06/24/17   Mariel Aloe, MD  ferrous sulfate 325 (65 FE) MG tablet Take 1 tablet (325 mg total) by mouth 2 (two) times daily with a meal. 04/22/18   Hassell Done, Mary-Margaret, FNP  Ipratropium-Albuterol (COMBIVENT RESPIMAT) 20-100 MCG/ACT AERS respimat Inhale 1 puff into the lungs every 6 (six) hours as needed for wheezing. 04/22/18   Hassell Done, Mary-Margaret, FNP  levofloxacin (LEVAQUIN) 500 MG tablet Take 1 tablet (500 mg total) by mouth daily. X 4 days 12/12/18   Orson Eva, MD  lisinopril (PRINIVIL,ZESTRIL) 20 MG tablet Take 1 tablet (20 mg total) by mouth daily. 04/22/18   Hassell Done, Mary-Margaret, FNP  metoprolol succinate (TOPROL-XL) 25 MG 24 hr tablet Take 0.5 tablets (12.5 mg total) by mouth daily. 07/02/18   Orson Eva, MD  mirtazapine (REMERON) 15 MG tablet Take 1 tablet (15 mg total) by mouth at bedtime. 04/22/18   Hassell Done, Mary-Margaret, FNP  omeprazole (PRILOSEC) 20 MG capsule Take 20 mg by mouth daily.    [provider]  OXYGEN Inhale 4 L into the lungs See admin instructions. TO MAINTAIN SATS OF 93% OR GREATER    [provider]  polyethylene glycol powder (GLYCOLAX/MIRALAX) powder 17 GRAMS (1 CAPFUL) ONCE A DAY AS DIRECTED 07/03/16   Hassell Done, Mary-Margaret, FNP  VENTOLIN HFA 108 (90 Base) MCG/ACT inhaler 2 PUFFS EVERY 6 HOURS AS NEEDED FOR WHEEZING OR SHORTNESS OF BREATH 04/03/17   Hassell Done, Mary-Margaret, FNP  vitamin C (ASCORBIC ACID) 500 MG tablet Take 1,000 mg by mouth 2 (two) times daily.     [provider]    Family History Family History  Problem Relation Age of Onset  . Hypertension Mother     Social History Social  History   Tobacco Use  . Smoking status: Former Smoker    Packs/day: 1.00    Years: 40.00    Pack years: 40.00    Types: Cigarettes    Last attempt to quit: 07/10/2012    Years since quitting: 6.6  . Smokeless tobacco: Former Network engineer Use Topics  . Alcohol use: No  . Drug use: No     Allergies   Codeine; Penicillins; Benicar hct [olmesartan medoxomil-hctz]; Hctz [hydrochlorothiazide]; and Naproxen   Review of Systems Review of Systems  Unable to perform ROS: Mental status change     Physical Exam Updated Vital Signs BP (!) 157/86 (BP Location: Right Arm)   Pulse 66   Temp 98.7 F (37.1 C) (Oral)   Resp 16   Ht 5\' 1"  (0.258 m)   Wt 56.7 kg   SpO2 100%   BMI 23.62 kg/m    Patient Vitals for the past 24 hrs:  BP Temp Temp src Pulse Resp SpO2  Height Weight  03/11/19 1951 - - - - - - 5\' 1"  (1.549 m) 56.7 kg  03/11/19 1950 (!) 157/86 98.7 F (37.1 C) Oral 66 16 100 % - -     Physical Exam 2005: Physical examination:  Nursing notes reviewed; Vital signs and O2 SAT reviewed;  Constitutional: Well developed, Well nourished, In no acute distress; Head:  Normocephalic, atraumatic; Eyes: EOMI, PERRL, No scleral icterus; ENMT: Mouth and pharynx normal, Mucous membranes dry/cracked; Neck: Supple, Full range of motion, No lymphadenopathy; Cardiovascular: Regular rate and rhythm, No gallop; Respiratory: Breath sounds clear & equal bilaterally, No wheezes.  Speaking full sentences with ease, Normal respiratory effort/excursion; Chest: Nontender, Movement normal; Abdomen: Soft, Nontender, Nondistended, Normal bowel sounds; Genitourinary: No CVA tenderness; Extremities: Peripheral pulses normal, No tenderness, No edema, No calf edema or asymmetry.; Neuro: Awake, alert, oriented to self.  No facial droop. Speech garbled, clearer at times. Moves all extremities spontaneously on stretcher without apparent gross focal motor deficits. Can sit pt up and then she leans to right side.;  Skin: Color normal, Warm, Dry.    ED Treatments / Results  Labs (all labs ordered are listed, but only abnormal results are displayed)   EKG EKG Interpretation  Date/Time:  Tuesday March 11 2019 19:52:13 EDT Ventricular Rate:  66 PR Interval:    QRS Duration: 92 QT Interval:  442 QTC Calculation: 408 R Axis:   0 Text Interpretation:  Sinus bradycardia Premature atrial complexes Short PR interval Nonspecific T abnrm, anterolateral leads Baseline wander Artifact When compared with ECG of 12/10/2018 No significant change was found Confirmed by Francine Graven [phone]) on 03/11/2019 8:13:16 PM   Radiology   Procedures Procedures (including critical care time)  Medications Ordered in ED Medications  0.9 %  sodium chloride infusion (has no administration in time range)     Initial Impression / Assessment and Plan / ED Course  I have reviewed the triage vital signs and the nursing notes.  Pertinent labs & imaging results that were available during my care of the patient were reviewed by me and considered in my medical decision making (see chart for details).    MDM Reviewed: previous chart, nursing note and vitals Reviewed previous: labs and ECG Interpretation: labs, ECG, x-ray and CT scan    Results for orders placed or performed during the hospital encounter of 03/11/19  Comprehensive metabolic panel  Result Value Ref Range   Sodium 135 135 - 145 mmol/L   Potassium 4.2 3.5 - 5.1 mmol/L   Chloride 87 (L) 98 - 111 mmol/L   CO2 41 (H) 22 - 32 mmol/L   Glucose, Bld 112 (H) 70 - 99 mg/dL   BUN 11 8 - 23 mg/dL   Creatinine, Ser 0.38 (L) 0.44 - 1.00 mg/dL   Calcium 9.8 8.9 - 10.3 mg/dL   Total Protein 6.6 6.5 - 8.1 g/dL   Albumin 3.3 (L) 3.5 - 5.0 g/dL   AST 22 15 - 41 U/L   ALT 7 0 - 44 U/L   Alkaline Phosphatase 68 38 - 126 U/L   Total Bilirubin 0.2 (L) 0.3 - 1.2 mg/dL   GFR calc non Af Amer >60 >60 mL/min   GFR calc Af Amer >60 >60 mL/min   Anion gap 7 5 - 15   Troponin I - Once  Result Value Ref Range   Troponin I <0.03 <0.03 ng/mL  Lactic acid, plasma  Result Value Ref Range   Lactic Acid, Venous 0.7 0.5 - 1.9  mmol/L  CBC with Differential  Result Value Ref Range   WBC 10.2 4.0 - 10.5 K/uL   RBC 3.81 (L) 3.87 - 5.11 MIL/uL   Hemoglobin 11.4 (L) 12.0 - 15.0 g/dL   HCT 35.1 (L) 36.0 - 46.0 %   MCV 92.1 80.0 - 100.0 fL   MCH 29.9 26.0 - 34.0 pg   MCHC 32.5 30.0 - 36.0 g/dL   RDW 11.7 11.5 - 15.5 %   Platelets 144 (L) 150 - 400 K/uL   nRBC 0.0 0.0 - 0.2 %   Neutrophils Relative % 85 %   Neutro Abs 8.7 (H) 1.7 - 7.7 K/uL   Lymphocytes Relative 8 %   Lymphs Abs 0.8 0.7 - 4.0 K/uL   Monocytes Relative 6 %   Monocytes Absolute 0.6 0.1 - 1.0 K/uL   Eosinophils Relative 1 %   Eosinophils Absolute 0.1 0.0 - 0.5 K/uL   Basophils Relative 0 %   Basophils Absolute 0.0 0.0 - 0.1 K/uL   Immature Granulocytes 0 %   Abs Immature Granulocytes 0.03 0.00 - 0.07 K/uL  Brain natriuretic peptide  Result Value Ref Range   B Natriuretic Peptide 144.0 (H) 0.0 - 100.0 pg/mL  Protime-INR  Result Value Ref Range   Prothrombin Time 12.0 11.4 - 15.2 seconds   INR 0.9 0.8 - 1.2  Urinalysis, Routine w reflex microscopic  Result Value Ref Range   Color, Urine YELLOW YELLOW   APPearance HAZY (A) CLEAR   Specific Gravity, Urine 1.015 1.005 - 1.030   pH 6.0 5.0 - 8.0   Glucose, UA NEGATIVE NEGATIVE mg/dL   Hgb urine dipstick SMALL (A) NEGATIVE   Bilirubin Urine NEGATIVE NEGATIVE   Ketones, ur NEGATIVE NEGATIVE mg/dL   Protein, ur 30 (A) NEGATIVE mg/dL   Nitrite NEGATIVE NEGATIVE   Leukocytes,Ua LARGE (A) NEGATIVE   RBC / HPF 0-5 0 - 5 RBC/hpf   WBC, UA >50 (H) 0 - 5 WBC/hpf   Bacteria, UA MANY (A) NONE SEEN   WBC Clumps PRESENT    Mucus PRESENT    Hyaline Casts, UA PRESENT    Dg Chest 1 View Result Date: 03/11/2019 CLINICAL DATA:  Altered mental status. EXAM: CHEST  1 VIEW COMPARISON:  12/09/2018 FINDINGS: Normal heart size. Tortuous and unfolded  thoracic aorta. Both lungs are clear. The visualized skeletal structures are unremarkable. IMPRESSION: No active disease. Electronically Signed   By: Kerby Moors M.D.   On: 03/11/2019 21:02   Ct Head Wo Contrast Result Date: 03/11/2019 CLINICAL DATA:  85 year old female with confusion. EXAM: CT HEAD WITHOUT CONTRAST TECHNIQUE: Contiguous axial images were obtained from the base of the skull through the vertex without intravenous contrast. COMPARISON:  Head CT dated 03/11/2014 FINDINGS: Evaluation of this exam is limited due to motion artifact. Brain: There is mild age-related atrophy and moderate chronic microvascular ischemic changes. An area of hypodensity in the left parietal convexity (series 2, image 25) likely represents chronic microvascular ischemic changes. If there is high clinical concern for acute ischemia, further evaluation with MRI is recommended. There is no acute intracranial hemorrhage. No mass effect or midline shift. No extra-axial fluid collection. Vascular: No hyperdense vessel or unexpected calcification. Skull: Normal. Negative for fracture or focal lesion. Sinuses/Orbits: The visualized paranasal sinuses are clear. There is partial opacification of the left mastoid air cells. The right mastoid air cells are clear. Other: None IMPRESSION: 1. No acute intracranial hemorrhage. 2. Age-related atrophy and chronic microvascular ischemic changes. If there is high  clinical concern for acute ischemia further evaluation with MRI is recommended. Electronically Signed   By: Anner Crete M.D.   On: 03/11/2019 21:06     2210:  Clinically appears dehydrated; IVF started. +UTI, UC pending; given allergies, will start IV cipro. T/C returned from [facility] Dr. [name], case discussed, including:  HPI, pertinent PM/SHx, VS/PE, dx testing, ED course and treatment:  Agreeable to admit, requests to obtain CRP, procalcitonin and CT chest w/o contrast.     Final Clinical Impressions(s) / ED Diagnoses    Final diagnoses:  None    ED Discharge Orders    None       Francine Graven, DO 03/15/19 1232

## 2019-03-12 DIAGNOSIS — J449 Chronic obstructive pulmonary disease, unspecified: Secondary | ICD-10-CM | POA: Diagnosis present

## 2019-03-12 DIAGNOSIS — F339 Major depressive disorder, recurrent, unspecified: Secondary | ICD-10-CM | POA: Diagnosis not present

## 2019-03-12 DIAGNOSIS — Z886 Allergy status to analgesic agent status: Secondary | ICD-10-CM | POA: Diagnosis not present

## 2019-03-12 DIAGNOSIS — I10 Essential (primary) hypertension: Secondary | ICD-10-CM | POA: Diagnosis not present

## 2019-03-12 DIAGNOSIS — E860 Dehydration: Secondary | ICD-10-CM | POA: Diagnosis not present

## 2019-03-12 DIAGNOSIS — Z885 Allergy status to narcotic agent status: Secondary | ICD-10-CM | POA: Diagnosis not present

## 2019-03-12 DIAGNOSIS — N3000 Acute cystitis without hematuria: Secondary | ICD-10-CM

## 2019-03-12 DIAGNOSIS — R4182 Altered mental status, unspecified: Secondary | ICD-10-CM | POA: Diagnosis not present

## 2019-03-12 DIAGNOSIS — Z9981 Dependence on supplemental oxygen: Secondary | ICD-10-CM | POA: Diagnosis not present

## 2019-03-12 DIAGNOSIS — Z87891 Personal history of nicotine dependence: Secondary | ICD-10-CM | POA: Diagnosis not present

## 2019-03-12 DIAGNOSIS — K210 Gastro-esophageal reflux disease with esophagitis: Secondary | ICD-10-CM | POA: Diagnosis not present

## 2019-03-12 DIAGNOSIS — Z1619 Resistance to other specified beta lactam antibiotics: Secondary | ICD-10-CM | POA: Diagnosis not present

## 2019-03-12 DIAGNOSIS — N390 Urinary tract infection, site not specified: Secondary | ICD-10-CM | POA: Diagnosis present

## 2019-03-12 DIAGNOSIS — F419 Anxiety disorder, unspecified: Secondary | ICD-10-CM | POA: Diagnosis not present

## 2019-03-12 DIAGNOSIS — F0391 Unspecified dementia with behavioral disturbance: Secondary | ICD-10-CM | POA: Diagnosis present

## 2019-03-12 DIAGNOSIS — I5042 Chronic combined systolic (congestive) and diastolic (congestive) heart failure: Secondary | ICD-10-CM | POA: Diagnosis present

## 2019-03-12 DIAGNOSIS — I252 Old myocardial infarction: Secondary | ICD-10-CM | POA: Diagnosis not present

## 2019-03-12 DIAGNOSIS — Z66 Do not resuscitate: Secondary | ICD-10-CM | POA: Diagnosis present

## 2019-03-12 DIAGNOSIS — M810 Age-related osteoporosis without current pathological fracture: Secondary | ICD-10-CM | POA: Diagnosis present

## 2019-03-12 DIAGNOSIS — I491 Atrial premature depolarization: Secondary | ICD-10-CM | POA: Diagnosis present

## 2019-03-12 DIAGNOSIS — Z8249 Family history of ischemic heart disease and other diseases of the circulatory system: Secondary | ICD-10-CM | POA: Diagnosis not present

## 2019-03-12 DIAGNOSIS — I130 Hypertensive heart and chronic kidney disease with heart failure and stage 1 through stage 4 chronic kidney disease, or unspecified chronic kidney disease: Secondary | ICD-10-CM | POA: Diagnosis present

## 2019-03-12 DIAGNOSIS — R1310 Dysphagia, unspecified: Secondary | ICD-10-CM | POA: Diagnosis present

## 2019-03-12 DIAGNOSIS — N183 Chronic kidney disease, stage 3 (moderate): Secondary | ICD-10-CM | POA: Diagnosis present

## 2019-03-12 DIAGNOSIS — Z888 Allergy status to other drugs, medicaments and biological substances status: Secondary | ICD-10-CM | POA: Diagnosis not present

## 2019-03-12 DIAGNOSIS — K219 Gastro-esophageal reflux disease without esophagitis: Secondary | ICD-10-CM | POA: Diagnosis present

## 2019-03-12 DIAGNOSIS — B961 Klebsiella pneumoniae [K. pneumoniae] as the cause of diseases classified elsewhere: Secondary | ICD-10-CM | POA: Diagnosis present

## 2019-03-12 DIAGNOSIS — R404 Transient alteration of awareness: Secondary | ICD-10-CM | POA: Diagnosis not present

## 2019-03-12 DIAGNOSIS — J432 Centrilobular emphysema: Secondary | ICD-10-CM | POA: Diagnosis not present

## 2019-03-12 DIAGNOSIS — L821 Other seborrheic keratosis: Secondary | ICD-10-CM | POA: Diagnosis present

## 2019-03-12 DIAGNOSIS — J9611 Chronic respiratory failure with hypoxia: Secondary | ICD-10-CM | POA: Diagnosis present

## 2019-03-12 DIAGNOSIS — Z880 Allergy status to penicillin: Secondary | ICD-10-CM | POA: Diagnosis not present

## 2019-03-12 DIAGNOSIS — G92 Toxic encephalopathy: Secondary | ICD-10-CM

## 2019-03-12 DIAGNOSIS — E785 Hyperlipidemia, unspecified: Secondary | ICD-10-CM | POA: Diagnosis present

## 2019-03-12 DIAGNOSIS — E43 Unspecified severe protein-calorie malnutrition: Secondary | ICD-10-CM | POA: Diagnosis not present

## 2019-03-12 DIAGNOSIS — Z7401 Bed confinement status: Secondary | ICD-10-CM | POA: Diagnosis not present

## 2019-03-12 LAB — CBC
HCT: 34.1 % — ABNORMAL LOW (ref 36.0–46.0)
Hemoglobin: 10.5 g/dL — ABNORMAL LOW (ref 12.0–15.0)
MCH: 28.5 pg (ref 26.0–34.0)
MCHC: 30.8 g/dL (ref 30.0–36.0)
MCV: 92.7 fL (ref 80.0–100.0)
Platelets: 139 10*3/uL — ABNORMAL LOW (ref 150–400)
RBC: 3.68 MIL/uL — ABNORMAL LOW (ref 3.87–5.11)
RDW: 11.6 % (ref 11.5–15.5)
WBC: 7.9 10*3/uL (ref 4.0–10.5)
nRBC: 0 % (ref 0.0–0.2)

## 2019-03-12 LAB — BASIC METABOLIC PANEL
Anion gap: 9 (ref 5–15)
BUN: 8 mg/dL (ref 8–23)
CO2: 38 mmol/L — ABNORMAL HIGH (ref 22–32)
Calcium: 9.4 mg/dL (ref 8.9–10.3)
Chloride: 89 mmol/L — ABNORMAL LOW (ref 98–111)
Creatinine, Ser: 0.31 mg/dL — ABNORMAL LOW (ref 0.44–1.00)
GFR calc Af Amer: >60 mL/min (ref >60)
GFR calc non Af Amer: >60 mL/min (ref >60)
Glucose, Bld: 90 mg/dL (ref 70–99)
Potassium: 3.7 mmol/L (ref 3.5–5.1)
Sodium: 136 mmol/L (ref 135–145)

## 2019-03-12 LAB — INFLUENZA PANEL BY PCR (TYPE A & B)
Influenza A By PCR: NEGATIVE
Influenza B By PCR: NEGATIVE

## 2019-03-12 LAB — C-REACTIVE PROTEIN: CRP: <0.8 mg/dL

## 2019-03-12 MED ORDER — ONDANSETRON HCL 4 MG PO TABS: 4.0000 mg | ORAL_TABLET | Freq: Four times a day (QID) | ORAL | Status: DC | PRN

## 2019-03-12 MED ORDER — ONDANSETRON HCL 4 MG/2ML IJ SOLN: 4.0000 mg | Freq: Four times a day (QID) | INTRAMUSCULAR | Status: DC | PRN

## 2019-03-12 MED ORDER — ACETAMINOPHEN 650 MG RE SUPP: 650.0000 mg | Freq: Four times a day (QID) | RECTAL | Status: DC | PRN

## 2019-03-12 MED ORDER — IPRATROPIUM-ALBUTEROL 20-100 MCG/ACT IN AERS: 1.0000 | INHALATION_SPRAY | Freq: Four times a day (QID) | RESPIRATORY_TRACT | Status: DC | PRN

## 2019-03-12 MED ORDER — METOPROLOL SUCCINATE ER 25 MG PO TB24
12.5000 mg | ORAL_TABLET | Freq: Every day | ORAL | Status: DC
  Filled 2019-03-12: qty 1

## 2019-03-12 MED ORDER — METRONIDAZOLE IN NACL 5-0.79 MG/ML-% IV SOLN
500.0000 mg | Freq: Four times a day (QID) | INTRAVENOUS | Status: DC
  Administered 2019-03-12 – 2019-03-14 (×11): 500 mg via INTRAVENOUS
  Filled 2019-03-12 (×11): qty 100

## 2019-03-12 MED ORDER — PANTOPRAZOLE SODIUM 40 MG PO TBEC
40.0000 mg | DELAYED_RELEASE_TABLET | Freq: Every day | ORAL | Status: DC
  Administered 2019-03-12 – 2019-03-14 (×3): 40 mg via ORAL
  Filled 2019-03-12 (×3): qty 1

## 2019-03-12 MED ORDER — METOPROLOL TARTRATE 25 MG/10 ML ORAL SUSPENSION
6.2500 mg | Freq: Two times a day (BID) | ORAL | Status: DC
  Administered 2019-03-12 – 2019-03-14 (×4): 6.25 mg via ORAL
  Filled 2019-03-12 (×11): qty 2.5

## 2019-03-12 MED ORDER — LORAZEPAM 0.5 MG PO TABS: 0.2500 mg | ORAL_TABLET | Freq: Three times a day (TID) | ORAL | Status: DC | PRN

## 2019-03-12 MED ORDER — ALBUTEROL SULFATE HFA 108 (90 BASE) MCG/ACT IN AERS: 2.0000 | INHALATION_SPRAY | Freq: Four times a day (QID) | RESPIRATORY_TRACT | Status: DC | PRN

## 2019-03-12 MED ORDER — SODIUM CHLORIDE 0.9 % IV SOLN: 250.0000 mL | INTRAVENOUS | Status: DC | PRN

## 2019-03-12 MED ORDER — ENOXAPARIN SODIUM 40 MG/0.4ML ~~LOC~~ SOLN: 40.0000 mg | SUBCUTANEOUS | Status: DC

## 2019-03-12 MED ORDER — ACETAMINOPHEN 325 MG PO TABS: 650.0000 mg | ORAL_TABLET | Freq: Four times a day (QID) | ORAL | Status: DC | PRN

## 2019-03-12 MED ORDER — SODIUM CHLORIDE 0.9% FLUSH: 3.0000 mL | INTRAVENOUS | Status: DC | PRN

## 2019-03-12 MED ORDER — SODIUM CHLORIDE 0.9 % IV SOLN
1.0000 g | INTRAVENOUS | Status: DC
  Administered 2019-03-12 – 2019-03-14 (×3): 1 g via INTRAVENOUS
  Filled 2019-03-12 (×3): qty 10

## 2019-03-12 MED ORDER — MIRTAZAPINE 15 MG PO TABS
15.0000 mg | ORAL_TABLET | Freq: Every day | ORAL | Status: DC
  Administered 2019-03-12 – 2019-03-13 (×2): 15 mg via ORAL
  Filled 2019-03-12 (×2): qty 1

## 2019-03-12 MED ORDER — ENOXAPARIN SODIUM 30 MG/0.3ML ~~LOC~~ SOLN
30.0000 mg | SUBCUTANEOUS | Status: DC
  Administered 2019-03-12 – 2019-03-14 (×3): 30 mg via SUBCUTANEOUS
  Filled 2019-03-12 (×3): qty 0.3

## 2019-03-12 MED ORDER — ORAL CARE MOUTH RINSE
15.0000 mL | Freq: Two times a day (BID) | OROMUCOSAL | Status: DC
  Administered 2019-03-12 – 2019-03-14 (×6): 15 mL via OROMUCOSAL

## 2019-03-12 MED ORDER — CITALOPRAM HYDROBROMIDE 20 MG PO TABS
20.0000 mg | ORAL_TABLET | Freq: Every day | ORAL | Status: DC
  Administered 2019-03-12 – 2019-03-14 (×3): 20 mg via ORAL
  Filled 2019-03-12 (×3): qty 1

## 2019-03-12 MED ORDER — SODIUM CHLORIDE 0.9% FLUSH
3.0000 mL | Freq: Two times a day (BID) | INTRAVENOUS | Status: DC
  Administered 2019-03-12 – 2019-03-14 (×4): 3 mL via INTRAVENOUS

## 2019-03-12 NOTE — Care Management Important Message (Signed)
Important Message  Patient Details  Name: Tiffany Maxwell MRN: [id number] Date of Birth: 07/20/1934   Medicare Important Message Given:  Yes    Tommy Medal 03/12/2019, 3:41 PM

## 2019-03-12 NOTE — Progress Notes (Signed)
Patient seen and examined. Admitted after midnight secondary to AMS (patient with underlying dementia) and concerns for UTI. Cultures pending, using rocephin and flagyl empirically. Hemodynamically stable. Please refer to H&P written by Dr. [name] for further info/details on admission.  Barton Dubois MD [phone]

## 2019-03-12 NOTE — Evaluation (Signed)
Clinical/Bedside Swallow Evaluation Patient Details  Name: Tiffany Maxwell MRN: [id number] Date of Birth: 1934-09-01  Today's Date: 03/12/2019 Time: SLP Start Time (ACUTE ONLY): 1500 SLP Stop Time (ACUTE ONLY): 1530 SLP Time Calculation (min) (ACUTE ONLY): 30 min  Past Medical History:  Past Medical History:  Diagnosis Date  . CHF (congestive heart failure) ([place])   . Cholelithiasis 01/30/2013  . Cognitive impairment   . Communication deficit   . Compression fracture of lumbar spine, non-traumatic ([place]) 04/09/2014  . COPD (chronic obstructive pulmonary disease) ([place])   . Diastolic dysfunction 6/44/0347   Grade 1. Ejection fraction 65%.  . DNR (do not resuscitate)   . Hip fracture ([place]) 06/19/2017  . History of tobacco use 01/30/2013  . Hyperlipidemia   . Hypertension   . MI (myocardial infarction) ([place])    pt unaware  . Multiple fractures of thoracic spine, closed ([place]) 01/30/2013  . On home O2   . Osteoporosis   . PAC (premature atrial contraction)   . Palpitations   . Pulmonary emphysema ([place]) 01/30/2013  . Seborrheic keratosis    Past Surgical History:  Past Surgical History:  Procedure Laterality Date  . COMPRESSION HIP SCREW Left 04/29/2013   Procedure: COMPRESSION HIP;  Surgeon: Sanjuana Kava, MD;  Location: [facility];  Service: Orthopedics;  Laterality: Left;  [place]  . ESOPHAGOGASTRODUODENOSCOPY (EGD) WITH PROPOFOL N/A 07/11/2017   Procedure: ESOPHAGOGASTRODUODENOSCOPY (EGD) WITH PROPOFOL;  Surgeon: Ronald Lobo, MD;  Location: [place];  Service: Endoscopy;  Laterality: N/A;  . FEMUR IM NAIL Right 06/20/2017   Procedure: INTRAMEDULLARY (IM) NAIL FEMORAL;  Surgeon: Rod Can, MD;  Location: [place];  Service: Orthopedics;  Laterality: Right;  . TUBAL LIGATION     HPI:  Tiffany Maxwell is a 85 y.o. female with medical history significant of report of dementia, congestive heart failure, COPD, chronic respiratory failure on 4 L of oxygen at [facility]  brought in for being found confused and covered in feces with foul-smelling urine.  All report is obtained from emergency room staff and EMS only.  Patient afebrile vital signs stable was covered with feces in the ED with possible UTI.  She has not had any cough in the ED or shortness of breath.  She is on her baseline oxygen requirements of 4 L satting 100%.  She has no focal neurological symptoms although she was a code stroke which was quickly canceled.  She does have a history of aspirating in the past.  Patient is chest x-ray is negative.  Patient being referred for admission for altered mental status likely secondary to UTI. BSE requested.   Assessment / Plan / Recommendation Clinical Impression  Pt presents with confusion and some paranoia, but agreeable to limited po trials. Pt known to SLP from previous admission with clinical swallow evaluation completed at that time. Pt without overt signs or symptoms of aspiration at bedside, however intake was extremely limited. Pt refused most intake initially, however willing to take few bites/sips with redirection and encouragement. Will go ahead and initiate diet of D2/chopped with thin liquids with supervision for meals and po meds whole or crushed in puree. Above to RN, Lattie Haw. SLP will follow in acute setting.   SLP Visit Diagnosis: Dysphagia, oral phase (R13.11)    Aspiration Risk  Mild aspiration risk    Diet Recommendation Dysphagia 2 (Fine chop);Thin liquid   Liquid Administration via: Cup;Straw Medication Administration: Whole meds with puree Supervision: Staff to assist with self feeding;Full supervision/cueing for  compensatory strategies Compensations: Small sips/bites;Lingual sweep for clearance of pocketing Postural Changes: Seated upright at 90 degrees;Remain upright for at least 30 minutes after po intake    Other  Recommendations Oral Care Recommendations: Oral care BID;Staff/trained caregiver to provide oral care Other  Recommendations: Clarify dietary restrictions   Follow up Recommendations 24 hour supervision/assistance      Frequency and Duration min 2x/week  1 week       Prognosis Prognosis for Safe Diet Advancement: Fair Barriers to Reach Goals: Cognitive deficits      Swallow Study   General Date of Onset: 03/11/19 HPI: Tiffany Maxwell is a 85 y.o. female with medical history significant of report of dementia, congestive heart failure, COPD, chronic respiratory failure on 4 L of oxygen at [facility] brought in for being found confused and covered in feces with foul-smelling urine.  All report is obtained from emergency room staff and EMS only.  Patient afebrile vital signs stable was covered with feces in the ED with possible UTI.  She has not had any cough in the ED or shortness of breath.  She is on her baseline oxygen requirements of 4 L satting 100%.  She has no focal neurological symptoms although she was a code stroke which was quickly canceled.  She does have a history of aspirating in the past.  Patient is chest x-ray is negative.  Patient being referred for admission for altered mental status likely secondary to UTI. BSE requested. Type of Study: Bedside Swallow Evaluation Previous Swallow Assessment: 11/2018, BSE; D2/chopped with thin liquids  Diet Prior to this Study: NPO Temperature Spikes Noted: No Respiratory Status: Nasal cannula History of Recent Intubation: No Behavior/Cognition: Alert;Cooperative;Confused Oral Cavity Assessment: Within Functional Limits Oral Care Completed by SLP: Yes Oral Cavity - Dentition: Dentures, top(not wearing) Vision: Functional for self-feeding Self-Feeding Abilities: Able to feed self;Needs set up Patient Positioning: Upright in bed Baseline Vocal Quality: Normal Volitional Cough: Weak Volitional Swallow: Able to elicit    Oral/Motor/Sensory Function Overall Oral Motor/Sensory Function: Within functional limits   Ice Chips Ice chips: (Pt  refused) Presentation: Spoon   Thin Liquid Thin Liquid: Within functional limits Presentation: Cup;Self Fed;Straw    Nectar Thick Nectar Thick Liquid: Not tested   Honey Thick Honey Thick Liquid: Not tested   Puree Puree: Within functional limits Presentation: Spoon   Solid     Solid: Impaired Oral Phase Impairments: Reduced lingual movement/coordination     Thank you,  Genene Churn, [place]  PORTER,DABNEY 03/12/2019,4:29 PM

## 2019-03-12 NOTE — H&P (Signed)
History and Physical    Tiffany Maxwell:[id number] DOB: 05-04-1934 DOA: 03/11/2019  PCP: [name], FNP  Patient coming from: Tiffany Maxwell  Chief Complaint: Confused  HPI: Tiffany Maxwell is a 85 y.o. female with medical history significant of report of dementia, congestive heart failure, COPD, chronic respiratory failure on 4 L of oxygen at [facility] brought in for being found confused and covered in feces with foul-smelling urine.  All report is obtained from emergency room staff and EMS only.  Patient afebrile vital signs stable was covered with feces in the ED with possible UTI.  She has not had any cough in the ED or shortness of breath.  She is on her baseline oxygen requirements of 4 L satting 100%.  She has no focal neurological symptoms although she was a code stroke which was quickly canceled.  She does have a history of aspirating in the past.  Patient is chest x-ray is negative.  Patient being referred for admission for altered mental status likely secondary to UTI.  Review of Systems: Unobtainable secondary to dementia  Past Medical History:  Diagnosis Date  . CHF (congestive heart failure) ([place])   . Cholelithiasis 01/30/2013  . Cognitive impairment   . Communication deficit   . Compression fracture of lumbar spine, non-traumatic ([place]) 04/09/2014  . COPD (chronic obstructive pulmonary disease) ([place])   . Diastolic dysfunction 2/77/8242   Grade 1. Ejection fraction 65%.  . DNR (do not resuscitate)   . Hip fracture ([place]) 06/19/2017  . History of tobacco use 01/30/2013  . Hyperlipidemia   . Hypertension   . MI (myocardial infarction) ([place])    pt unaware  . Multiple fractures of thoracic spine, closed ([place]) 01/30/2013  . On home O2   . Osteoporosis   . PAC (premature atrial contraction)   . Palpitations   . Pulmonary emphysema ([place]) 01/30/2013  . Seborrheic keratosis     Past Surgical History:  Procedure Laterality Date  . COMPRESSION HIP SCREW Left  04/29/2013   Procedure: COMPRESSION HIP;  Surgeon: Sanjuana Kava, MD;  Location: [facility];  Service: Orthopedics;  Laterality: Left;  [place]  . ESOPHAGOGASTRODUODENOSCOPY (EGD) WITH PROPOFOL N/A 07/11/2017   Procedure: ESOPHAGOGASTRODUODENOSCOPY (EGD) WITH PROPOFOL;  Surgeon: Ronald Lobo, MD;  Location: [place];  Service: Endoscopy;  Laterality: N/A;  . FEMUR IM NAIL Right 06/20/2017   Procedure: INTRAMEDULLARY (IM) NAIL FEMORAL;  Surgeon: Rod Can, MD;  Location: [place];  Service: Orthopedics;  Laterality: Right;  . TUBAL LIGATION       reports that she quit smoking about 6 years ago. Her smoking use included cigarettes. She has a 40.00 pack-year smoking history. She has quit using smokeless tobacco. She reports that she does not drink alcohol or use drugs.  Allergies  Allergen Reactions  . Codeine Other (See Comments)    Headache   . Penicillins     Has patient had a PCN reaction causing immediate rash, facial/tongue/throat swelling, SOB or lightheadedness with hypotension: Yes Has patient had a PCN reaction causing severe rash involving mucus membranes or skin necrosis: No Has patient had a PCN reaction that required hospitalization: Unknown Has patient had a PCN reaction occurring within the last 10 years: Unknown If all of the above answers are "NO", then may proceed with Cephalosporin use.  Marland Kitchen Benicar Hct [Olmesartan Medoxomil-Hctz] Swelling and Rash  . Hctz [Hydrochlorothiazide] Swelling and Rash  . Naproxen Itching, Rash and Other (See Comments)    Redness/flushing of  skin    Family History  Problem Relation Age of Onset  . Hypertension Mother     Prior to Admission medications   Medication Sig Start Date End Date Taking? Authorizing Provider  citalopram (CELEXA) 20 MG tablet Take 20 mg by mouth daily.   Yes [provider]  Ipratropium-Albuterol (COMBIVENT RESPIMAT) 20-100 MCG/ACT AERS respimat Inhale 1 puff into the lungs every 6 (six) hours as  needed for wheezing. 04/22/18  Yes Martin, Mary-Margaret, FNP  lisinopril (PRINIVIL,ZESTRIL) 20 MG tablet Take 20 mg by mouth daily.   Yes [provider]  LORazepam (ATIVAN) 0.5 MG tablet Take 0.25 mg by mouth 3 (three) times daily.  03/09/19  Yes [provider]  metoprolol succinate (TOPROL-XL) 25 MG 24 hr tablet Take 12.5 mg by mouth daily.   Yes [provider]  mirtazapine (REMERON) 15 MG tablet Take 15 mg by mouth at bedtime.   Yes [provider]  morphine 20 MG/5ML solution Take 5 mg by mouth every 2 (two) hours as needed for pain.   Yes [provider]  omeprazole (PRILOSEC) 20 MG capsule Take 20 mg by mouth daily.   Yes [provider]  OXYGEN Inhale 4 L into the lungs See admin instructions. TO MAINTAIN SATS OF 93% OR GREATER   Yes [provider]  polyethylene glycol powder (GLYCOLAX/MIRALAX) powder Take 17 g by mouth daily.   Yes [provider]  VENTOLIN HFA 108 (90 Base) MCG/ACT inhaler 2 PUFFS EVERY 6 HOURS AS NEEDED FOR WHEEZING OR SHORTNESS OF BREATH Patient taking differently: Inhale 2 puffs into the lungs every 6 (six) hours as needed for wheezing or shortness of breath.  04/03/17  Yes Martin, Mary-Margaret, FNP  levofloxacin (LEVAQUIN) 500 MG tablet Take 1 tablet (500 mg total) by mouth daily. X 4 days Patient not taking: Reported on 03/11/2019 12/12/18   Orson Eva, MD    Physical Exam: Vitals:   03/11/19 2130 03/11/19 2200 03/11/19 2230 03/12/19 0000  BP: (!) 157/87 (!) 145/78 130/84 (!) 129/106  Pulse:  (!) 52 74   Resp: 19 19 (!) 23 17  Temp:      TempSrc:      SpO2:  100% (!) 88%   Weight:      Height:          Constitutional: NAD, calm, comfortable Vitals:   03/11/19 2130 03/11/19 2200 03/11/19 2230 03/12/19 0000  BP: (!) 157/87 (!) 145/78 130/84 (!) 129/106  Pulse:  (!) 52 74   Resp: 19 19 (!) 23 17  Temp:      TempSrc:      SpO2:  100% (!) 88%   Weight:      Height:       Eyes:  PERRL, lids and conjunctivae normal ENMT: Mucous membranes are moist. Posterior pharynx clear of any exudate or lesions.Normal dentition.  Neck: normal, supple, no masses, no thyromegaly Respiratory: clear to auscultation bilaterally, no wheezing, no crackles. Normal respiratory effort. No accessory muscle use.  Cardiovascular: Regular rate and rhythm, no murmurs / rubs / gallops. No extremity edema. 2+ pedal pulses. No carotid bruits.  Abdomen: no tenderness, no masses palpated. No hepatosplenomegaly. Bowel sounds positive.  Musculoskeletal: no clubbing / cyanosis. No joint deformity upper and lower extremities. Good ROM, no contractures. Normal muscle tone.  Skin: no rashes, lesions, ulcers. No induration Neurologic: CN 2-12 grossly intact. Sensation intact, DTR normal. Strength 5/5 in all 4.  Psychiatric: Not normal judgment and insight. Alert and  oriented x 1. Normal mood.    Labs on Admission: I have personally reviewed following labs and imaging studies  CBC: Recent Labs  Lab 03/11/19 2018  WBC 10.2  NEUTROABS 8.7*  HGB 11.4*  HCT 35.1*  MCV 92.1  PLT 573*   Basic Metabolic Panel: Recent Labs  Lab 03/11/19 2018  NA 135  K 4.2  CL 87*  CO2 41*  GLUCOSE 112*  BUN 11  CREATININE 0.38*  CALCIUM 9.8   GFR: Estimated Creatinine Clearance: 40.9 mL/min (A) (by C-G formula based on SCr of 0.38 mg/dL (L)). Liver Function Tests: Recent Labs  Lab 03/11/19 2018  AST 22  ALT 7  ALKPHOS 68  BILITOT 0.2*  PROT 6.6  ALBUMIN 3.3*   No results for input(s): LIPASE, AMYLASE in the last 168 hours. No results for input(s): AMMONIA in the last 168 hours. Coagulation Profile: Recent Labs  Lab 03/11/19 2018  INR 0.9   Cardiac Enzymes: Recent Labs  Lab 03/11/19 2018  TROPONINI <0.03   BNP (last 3 results) No results for input(s): PROBNP in the last 8760 hours. HbA1C: No results for input(s): HGBA1C in the last 72 hours. CBG: No results for input(s): GLUCAP in the  last 168 hours. Lipid Profile: No results for input(s): CHOL, HDL, LDLCALC, TRIG, CHOLHDL, LDLDIRECT in the last 72 hours. Thyroid Function Tests: No results for input(s): TSH, T4TOTAL, FREET4, T3FREE, THYROIDAB in the last 72 hours. Anemia Panel: No results for input(s): VITAMINB12, FOLATE, FERRITIN, TIBC, IRON, RETICCTPCT in the last 72 hours. Urine analysis:    Component Value Date/Time   COLORURINE YELLOW 03/11/2019 2018   APPEARANCEUR HAZY (A) 03/11/2019 2018   LABSPEC 1.015 03/11/2019 2018   PHURINE 6.0 03/11/2019 2018   GLUCOSEU NEGATIVE 03/11/2019 2018   HGBUR SMALL (A) 03/11/2019 2018   [place] NEGATIVE 03/11/2019 2018   [place] 03/11/2019 2018   PROTEINUR 30 (A) 03/11/2019 2018   UROBILINOGEN 0.2 04/07/2014 2303   NITRITE NEGATIVE 03/11/2019 2018   LEUKOCYTESUR LARGE (A) 03/11/2019 2018   Sepsis Labs: !!!!!!!!!!!!!!!!!!!!!!!!!!!!!!!!!!!!!!!!!!!! @LABRCNTIP (procalcitonin:4,lacticidven:4) )No results found for this or any previous visit (from the past 240 hour(s)).   Radiological Exams on Admission: Dg Chest 1 View  Result Date: 03/11/2019 CLINICAL DATA:  Altered mental status. EXAM: CHEST  1 VIEW COMPARISON:  12/09/2018 FINDINGS: Normal heart size. Tortuous and unfolded thoracic aorta. Both lungs are clear. The visualized skeletal structures are unremarkable. IMPRESSION: No active disease. Electronically Signed   By: Kerby Moors M.D.   On: 03/11/2019 21:02   Ct Head Wo Contrast  Result Date: 03/11/2019 CLINICAL DATA:  85 year old female with confusion. EXAM: CT HEAD WITHOUT CONTRAST TECHNIQUE: Contiguous axial images were obtained from the base of the skull through the vertex without intravenous contrast. COMPARISON:  Head CT dated 03/11/2014 FINDINGS: Evaluation of this exam is limited due to motion artifact. Brain: There is mild age-related atrophy and moderate chronic microvascular ischemic changes. An area of hypodensity in the left parietal convexity  (series 2, image 25) likely represents chronic microvascular ischemic changes. If there is high clinical concern for acute ischemia, further evaluation with MRI is recommended. There is no acute intracranial hemorrhage. No mass effect or midline shift. No extra-axial fluid collection. Vascular: No hyperdense vessel or unexpected calcification. Skull: Normal. Negative for fracture or focal lesion. Sinuses/Orbits: The visualized paranasal sinuses are clear. There is partial opacification of the left mastoid air cells. The right mastoid air cells are clear. Other: None IMPRESSION: 1. No acute intracranial hemorrhage.  2. Age-related atrophy and chronic microvascular ischemic changes. If there is high clinical concern for acute ischemia further evaluation with MRI is recommended. Electronically Signed   By: Anner Crete M.D.   On: 03/11/2019 21:06   Ct Chest Wo Contrast  Result Date: 03/11/2019 CLINICAL DATA:  85 year old female with confusion, altered mental status, and weakness. EXAM: CT CHEST WITHOUT CONTRAST TECHNIQUE: Multidetector CT imaging of the chest was performed following the standard protocol without IV contrast. COMPARISON:  Chest radiograph dated 03/11/2019 FINDINGS: Evaluation of this exam is limited in the absence of intravenous contrast. Evaluation is also limited due to respiratory motion artifact. Cardiovascular: There is mild cardiomegaly. Multi vessel coronary vascular calcifications noted. There is moderate atherosclerotic calcification of the thoracic aorta. The thoracic aorta is tortuous. The central pulmonary arteries are grossly unremarkable. Mediastinum/Nodes: No hilar or mediastinal adenopathy. The esophagus is grossly unremarkable. No mediastinal fluid collection. Lungs/Pleura: There is moderate centrilobular emphysema. Patchy subsegmental consolidative change at the right lung base may represent atelectasis or scarring. Pneumonia is not excluded. Clinical correlation is recommended.  Aspiration is favored less likely. There are biapical subpleural scarring. There is no pleural effusion or pneumothorax. The central airways are patent. Upper Abdomen: No acute abnormality. Musculoskeletal: Osteopenia. Multilevel age indeterminate but chronic appearing thoracic spine compression fractures. No retropulsion. IMPRESSION: 1. Right lower lobe subsegmental consolidation, likely atelectasis versus infiltrate. Aspiration is considered less likely. 2. Moderate centrilobular emphysema. 3. Mild cardiomegaly with multi vessel coronary vascular calcification and Aortic Atherosclerosis (ICD10-I70.0). 4. Osteopenia with multilevel age indeterminate but chronic appearing thoracic spine compression fractures. Electronically Signed   By: Anner Crete M.D.   On: 03/11/2019 23:17   Old chart reviewed Case discussed with Dr. Thurnell Garbe in the ED Chest x-ray reviewed no infiltrate or edema  Assessment/Plan 85 year old female with altered mental status likely secondary to UTI Principal Problem:   UTI (urinary tract infection)-follow-up on urine culture.  CT also showing possible right lower lobe infiltrate.  Will cover with Zosyn due to her risk of aspiration.  Also check CRP level.  Procalcitonin level.  Check influenza and placed on contact and droplet precautions for now.   Active Problems:   AMS (altered mental status)-no focal deficits   HTN (hypertension)-stable resume home meds   Chronic combined systolic (EF 16%) and diastolic CHF, NYHA class 1 (HCC)-stable clarify home meds in the morning   COPD (chronic obstructive pulmonary disease) (HCC)-clarify home meds in the morning lungs are clear at this time     DVT prophylaxis: SCDs Code Status: Full Family Communication: None Disposition Plan: 1 to 2 days Consults called: None Admission status: Admission   , A MD [facility]  If 7PM-7AM, please contact night-coverage [email or web address] Password [email or web address]  03/12/2019, 12:55 AM

## 2019-03-13 DIAGNOSIS — J9611 Chronic respiratory failure with hypoxia: Secondary | ICD-10-CM

## 2019-03-13 DIAGNOSIS — I10 Essential (primary) hypertension: Secondary | ICD-10-CM

## 2019-03-13 DIAGNOSIS — R4182 Altered mental status, unspecified: Secondary | ICD-10-CM

## 2019-03-13 DIAGNOSIS — I5042 Chronic combined systolic (congestive) and diastolic (congestive) heart failure: Secondary | ICD-10-CM

## 2019-03-13 DIAGNOSIS — J449 Chronic obstructive pulmonary disease, unspecified: Secondary | ICD-10-CM

## 2019-03-13 LAB — BASIC METABOLIC PANEL
Anion gap: 9 (ref 5–15)
BUN: 10 mg/dL (ref 8–23)
CO2: 37 mmol/L — ABNORMAL HIGH (ref 22–32)
Calcium: 9.3 mg/dL (ref 8.9–10.3)
Chloride: 93 mmol/L — ABNORMAL LOW (ref 98–111)
Creatinine, Ser: 0.31 mg/dL — ABNORMAL LOW (ref 0.44–1.00)
GFR calc Af Amer: >60 mL/min (ref >60)
GFR calc non Af Amer: >60 mL/min (ref >60)
Glucose, Bld: 67 mg/dL — ABNORMAL LOW (ref 70–99)
Potassium: 3.6 mmol/L (ref 3.5–5.1)
Sodium: 139 mmol/L (ref 135–145)

## 2019-03-13 NOTE — Progress Notes (Signed)
PROGRESS NOTE    Tiffany Maxwell  EXB:[id number] DOB: 01/11/1934 DOA: 03/11/2019 PCP: [name], FNP     Brief Narrative:  85 y.o. female with medical history significant of report of dementia, congestive heart failure, COPD, chronic respiratory failure on 4 L of oxygen at [facility] brought in for being found confused and covered in feces with foul-smelling urine.  Patient admitted for further evaluation and management of toxic encephalopathy in the setting of UTI.  Assessment & Plan: 1-Klebsiella pneumonia UTI (urinary tract infection) -Sensitivity pending -Continue maintaining adequate fluid resuscitation/hydration -Continue current antibiotics -Hopefully discharge to facility in the next 24 hours.  2-toxic encephalopathy -Patient with underlying history of dementia -Continue supportive care -Continue treatment for UTI. -Overall improving and currently able to follow commands, oriented x2; but with poor insight. Unknown baseline  3-HTN (hypertension) -Continue metoprolol -Blood pressure stable and well-controlled.  4-Chronic combined systolic (EF 10%) and diastolic CHF, NYHA class 1 (HCC) -Euvolemic -Continue beta-blockers -Continue checking weight on daily basis and follow intake/output.  5-chronic respiratory failure in the setting of COPD (chronic obstructive pulmonary disease) (HCC) -Continue oxygen supplementation (4 L through nasal cannula at baseline) -Continue home inhaler regimen.  6-history of dementia with underlying behavioral disturbances -Currently stable and without any signs of agitation. -Continue supportive care -Mood is stable -Continue Remeron and Lexapro  7-GERD -Continue PPI  DVT prophylaxis: Lovenox Code Status: DNR Family Communication: No family at bedside. Disposition Plan: Continue current antibiotics, follow sensitivities; most likely back to skilled nursing facility in a.m.  Consultants:   None   Procedures:   See  below for x-ray reports.  Antimicrobials:  Anti-infectives (From admission, onward)   Start     Dose/Rate Route Frequency Ordered Stop   03/12/19 0200  metroNIDAZOLE (FLAGYL) IVPB 500 mg     500 mg 100 mL/hr over 60 Minutes Intravenous Every 6 hours 03/12/19 0129     03/12/19 0145  cefTRIAXone (ROCEPHIN) 1 g in sodium chloride 0.9 % 100 mL IVPB     1 g 200 mL/hr over 30 Minutes Intravenous Every 24 hours 03/12/19 0129     03/11/19 2130  ciprofloxacin (CIPRO) IVPB 400 mg     400 mg 200 mL/hr over 60 Minutes Intravenous  Once 03/11/19 2126 03/11/19 2301      Subjective: Febrile, no nausea, no vomiting, no chest pain, reports stable breathing.  Objective: Vitals:   03/12/19 1411 03/12/19 2115 03/12/19 2129 03/13/19 0527  BP: 132/68 (!) 152/88  (!) 151/82  Pulse: (!) 59 (!) 52 66 67  Resp: 18 (!) 24  (!) 24  Temp: 98.7 F (37.1 C) 98.6 F (37 C)  99 F (37.2 C)  TempSrc: Oral Oral  Oral  SpO2: 96% 100%  99%  Weight:    47.9 kg  Height:        Intake/Output Summary (Last 24 hours) at 03/13/2019 1458 Last data filed at 03/13/2019 1435 Gross per 24 hour  Intake 220 ml  Output -  Net 220 ml   Filed Weights   03/12/19 0117 03/12/19 0615 03/13/19 0527  Weight: 45.4 kg 46.7 kg 47.9 kg    Examination: General exam: Alert, awake, oriented x 2; afebrile, following simple commands.  Unknown baseline. Respiratory system: good air movement bilaterally, no wheezing, no crackles.  Good oxygen saturation on 4 L of nasal cannula. Cardiovascular system:RRR. No rubs, No gallops. Gastrointestinal system: Abdomen is nondistended, soft and no guarding. No organomegaly or masses felt. Normal bowel sounds heard.  Positive right flank discomfort on palpation. Central nervous system: Alert and oriented. No focal neurological deficits. Extremities: No C/C/E, +pedal pulses Skin: No rashes, no petechiae. Psychiatry: Mood & affect appropriate.    Data Reviewed: I have personally reviewed  following labs and imaging studies  CBC: Recent Labs  Lab 03/11/19 2018 03/12/19 0443  WBC 10.2 7.9  NEUTROABS 8.7*  --   HGB 11.4* 10.5*  HCT 35.1* 34.1*  MCV 92.1 92.7  PLT 144* 938*   Basic Metabolic Panel: Recent Labs  Lab 03/11/19 2018 03/12/19 0443 03/13/19 0438  NA 135 136 139  K 4.2 3.7 3.6  CL 87* 89* 93*  CO2 41* 38* 37*  GLUCOSE 112* 90 67*  BUN 11 8 10   CREATININE 0.38* 0.31* 0.31*  CALCIUM 9.8 9.4 9.3   GFR: Estimated Creatinine Clearance: 40.9 mL/min (A) (by C-G formula based on SCr of 0.31 mg/dL (L)).   Liver Function Tests: Recent Labs  Lab 03/11/19 2018  AST 22  ALT 7  ALKPHOS 68  BILITOT 0.2*  PROT 6.6  ALBUMIN 3.3*   Coagulation Profile: Recent Labs  Lab 03/11/19 2018  INR 0.9   Cardiac Enzymes: Recent Labs  Lab 03/11/19 2018  TROPONINI <0.03   Urine analysis:    Component Value Date/Time   COLORURINE YELLOW 03/11/2019 2018   APPEARANCEUR HAZY (A) 03/11/2019 2018   LABSPEC 1.015 03/11/2019 2018   PHURINE 6.0 03/11/2019 2018   GLUCOSEU NEGATIVE 03/11/2019 2018   HGBUR SMALL (A) 03/11/2019 2018   [place] NEGATIVE 03/11/2019 2018   KETONESUR NEGATIVE 03/11/2019 2018   PROTEINUR 30 (A) 03/11/2019 2018   UROBILINOGEN 0.2 04/07/2014 2303   NITRITE NEGATIVE 03/11/2019 2018   LEUKOCYTESUR LARGE (A) 03/11/2019 2018    Recent Results (from the past 240 hour(s))  Urine culture     Status: Abnormal (Preliminary result)   Collection Time: 03/11/19  8:18 PM  Result Value Ref Range Status   Specimen Description   Final    URINE, CLEAN CATCH Performed at [facility], [address], [place], [place] [address]    Special Requests   Final    NONE Performed at [facility], [address], [place], [place] [address]    Culture (A)  Final    >=100,000 COLONIES/mL KLEBSIELLA PNEUMONIAE SUSCEPTIBILITIES TO FOLLOW Performed at [facility], [place] [address] [place], [place] [address]    Report Status PENDING  Incomplete       Radiology Studies: Dg Chest 1 View  Result Date: 03/11/2019 CLINICAL DATA:  Altered mental status. EXAM: CHEST  1 VIEW COMPARISON:  12/09/2018 FINDINGS: Normal heart size. Tortuous and unfolded thoracic aorta. Both lungs are clear. The visualized skeletal structures are unremarkable. IMPRESSION: No active disease. Electronically Signed   By: Kerby Moors M.D.   On: 03/11/2019 21:02   Ct Head Wo Contrast  Result Date: 03/11/2019 CLINICAL DATA:  85 year old female with confusion. EXAM: CT HEAD WITHOUT CONTRAST TECHNIQUE: Contiguous axial images were obtained from the base of the skull through the vertex without intravenous contrast. COMPARISON:  Head CT dated 03/11/2014 FINDINGS: Evaluation of this exam is limited due to motion artifact. Brain: There is mild age-related atrophy and moderate chronic microvascular ischemic changes. An area of hypodensity in the left parietal convexity (series 2, image 25) likely represents chronic microvascular ischemic changes. If there is high clinical concern for acute ischemia, further evaluation with MRI is recommended. There is no acute intracranial hemorrhage. No mass effect or midline shift. No  extra-axial fluid collection. Vascular: No hyperdense vessel or unexpected calcification. Skull: Normal. Negative for fracture or focal lesion. Sinuses/Orbits: The visualized paranasal sinuses are clear. There is partial opacification of the left mastoid air cells. The right mastoid air cells are clear. Other: None IMPRESSION: 1. No acute intracranial hemorrhage. 2. Age-related atrophy and chronic microvascular ischemic changes. If there is high clinical concern for acute ischemia further evaluation with MRI is recommended. Electronically Signed   By: Anner Crete M.D.   On: 03/11/2019 21:06   Ct Chest Wo Contrast  Result Date: 03/11/2019 CLINICAL DATA:  85 year old female with confusion, altered mental status, and weakness. EXAM: CT CHEST WITHOUT CONTRAST TECHNIQUE:  Multidetector CT imaging of the chest was performed following the standard protocol without IV contrast. COMPARISON:  Chest radiograph dated 03/11/2019 FINDINGS: Evaluation of this exam is limited in the absence of intravenous contrast. Evaluation is also limited due to respiratory motion artifact. Cardiovascular: There is mild cardiomegaly. Multi vessel coronary vascular calcifications noted. There is moderate atherosclerotic calcification of the thoracic aorta. The thoracic aorta is tortuous. The central pulmonary arteries are grossly unremarkable. Mediastinum/Nodes: No hilar or mediastinal adenopathy. The esophagus is grossly unremarkable. No mediastinal fluid collection. Lungs/Pleura: There is moderate centrilobular emphysema. Patchy subsegmental consolidative change at the right lung base may represent atelectasis or scarring. Pneumonia is not excluded. Clinical correlation is recommended. Aspiration is favored less likely. There are biapical subpleural scarring. There is no pleural effusion or pneumothorax. The central airways are patent. Upper Abdomen: No acute abnormality. Musculoskeletal: Osteopenia. Multilevel age indeterminate but chronic appearing thoracic spine compression fractures. No retropulsion. IMPRESSION: 1. Right lower lobe subsegmental consolidation, likely atelectasis versus infiltrate. Aspiration is considered less likely. 2. Moderate centrilobular emphysema. 3. Mild cardiomegaly with multi vessel coronary vascular calcification and Aortic Atherosclerosis (ICD10-I70.0). 4. Osteopenia with multilevel age indeterminate but chronic appearing thoracic spine compression fractures. Electronically Signed   By: Anner Crete M.D.   On: 03/11/2019 23:17    Scheduled Meds: . citalopram  20 mg Oral Daily  . enoxaparin (LOVENOX) injection  30 mg Subcutaneous Q24H  . mouth rinse  15 mL Mouth Rinse BID  . metoprolol tartrate  6.25 mg Oral BID  . mirtazapine  15 mg Oral QHS  . pantoprazole  40  mg Oral Daily  . sodium chloride flush  3 mL Intravenous Q12H   Continuous Infusions: . sodium chloride    . cefTRIAXone (ROCEPHIN)  IV 1 g (03/13/19 0029)  . metronidazole 500 mg (03/13/19 0955)     LOS: 1 day    Time spent: 30 minutes   Barton Dubois, MD [facility] Pager [phone]  03/13/2019, 2:58 PM

## 2019-03-14 DIAGNOSIS — J432 Centrilobular emphysema: Secondary | ICD-10-CM | POA: Diagnosis not present

## 2019-03-14 DIAGNOSIS — N390 Urinary tract infection, site not specified: Secondary | ICD-10-CM | POA: Diagnosis not present

## 2019-03-14 DIAGNOSIS — J9611 Chronic respiratory failure with hypoxia: Secondary | ICD-10-CM

## 2019-03-14 DIAGNOSIS — N3000 Acute cystitis without hematuria: Secondary | ICD-10-CM | POA: Diagnosis not present

## 2019-03-14 DIAGNOSIS — J441 Chronic obstructive pulmonary disease with (acute) exacerbation: Secondary | ICD-10-CM | POA: Diagnosis not present

## 2019-03-14 DIAGNOSIS — Z79899 Other long term (current) drug therapy: Secondary | ICD-10-CM | POA: Diagnosis not present

## 2019-03-14 DIAGNOSIS — F419 Anxiety disorder, unspecified: Secondary | ICD-10-CM | POA: Diagnosis not present

## 2019-03-14 DIAGNOSIS — E43 Unspecified severe protein-calorie malnutrition: Secondary | ICD-10-CM | POA: Diagnosis not present

## 2019-03-14 DIAGNOSIS — K210 Gastro-esophageal reflux disease with esophagitis: Secondary | ICD-10-CM | POA: Diagnosis not present

## 2019-03-14 DIAGNOSIS — Z7401 Bed confinement status: Secondary | ICD-10-CM | POA: Diagnosis not present

## 2019-03-14 DIAGNOSIS — R634 Abnormal weight loss: Secondary | ICD-10-CM | POA: Diagnosis not present

## 2019-03-14 DIAGNOSIS — I10 Essential (primary) hypertension: Secondary | ICD-10-CM | POA: Diagnosis not present

## 2019-03-14 DIAGNOSIS — R404 Transient alteration of awareness: Secondary | ICD-10-CM | POA: Diagnosis not present

## 2019-03-14 DIAGNOSIS — N183 Chronic kidney disease, stage 3 (moderate): Secondary | ICD-10-CM | POA: Diagnosis not present

## 2019-03-14 DIAGNOSIS — F339 Major depressive disorder, recurrent, unspecified: Secondary | ICD-10-CM | POA: Diagnosis not present

## 2019-03-14 DIAGNOSIS — Z1619 Resistance to other specified beta lactam antibiotics: Secondary | ICD-10-CM | POA: Diagnosis not present

## 2019-03-14 DIAGNOSIS — R1310 Dysphagia, unspecified: Secondary | ICD-10-CM | POA: Diagnosis not present

## 2019-03-14 DIAGNOSIS — B961 Klebsiella pneumoniae [K. pneumoniae] as the cause of diseases classified elsewhere: Secondary | ICD-10-CM | POA: Diagnosis not present

## 2019-03-14 DIAGNOSIS — J449 Chronic obstructive pulmonary disease, unspecified: Secondary | ICD-10-CM | POA: Diagnosis not present

## 2019-03-14 DIAGNOSIS — R4182 Altered mental status, unspecified: Secondary | ICD-10-CM | POA: Diagnosis not present

## 2019-03-14 DIAGNOSIS — I739 Peripheral vascular disease, unspecified: Secondary | ICD-10-CM | POA: Diagnosis not present

## 2019-03-14 DIAGNOSIS — G92 Toxic encephalopathy: Secondary | ICD-10-CM | POA: Diagnosis not present

## 2019-03-14 DIAGNOSIS — I5042 Chronic combined systolic (congestive) and diastolic (congestive) heart failure: Secondary | ICD-10-CM | POA: Diagnosis not present

## 2019-03-14 DIAGNOSIS — F0391 Unspecified dementia with behavioral disturbance: Secondary | ICD-10-CM | POA: Diagnosis not present

## 2019-03-14 DIAGNOSIS — B351 Tinea unguium: Secondary | ICD-10-CM | POA: Diagnosis not present

## 2019-03-14 DIAGNOSIS — Z9981 Dependence on supplemental oxygen: Secondary | ICD-10-CM | POA: Diagnosis not present

## 2019-03-14 DIAGNOSIS — I130 Hypertensive heart and chronic kidney disease with heart failure and stage 1 through stage 4 chronic kidney disease, or unspecified chronic kidney disease: Secondary | ICD-10-CM | POA: Diagnosis not present

## 2019-03-14 MED ORDER — LORAZEPAM 0.5 MG PO TABS: 0.2500 mg | ORAL_TABLET | Freq: Three times a day (TID) | ORAL | 0 refills | Status: AC | PRN

## 2019-03-14 MED ORDER — ENOXAPARIN SODIUM 40 MG/0.4ML ~~LOC~~ SOLN: 40.0000 mg | SUBCUTANEOUS | Status: DC

## 2019-03-14 MED ORDER — POLYETHYLENE GLYCOL 3350 17 GM/SCOOP PO POWD: 17.0000 g | Freq: Every day | ORAL | Status: AC | PRN

## 2019-03-14 MED ORDER — ACETAMINOPHEN 325 MG PO TABS: 650.0000 mg | ORAL_TABLET | Freq: Four times a day (QID) | ORAL | Status: AC | PRN

## 2019-03-14 MED ORDER — CEFUROXIME AXETIL 250 MG PO TABS: 250.0000 mg | ORAL_TABLET | Freq: Two times a day (BID) | ORAL | 0 refills | Status: AC

## 2019-03-14 MED ORDER — MORPHINE SULFATE 20 MG/5ML PO SOLN: 5.0000 mg | ORAL | 0 refills | Status: AC | PRN

## 2019-03-14 MED ORDER — ENOXAPARIN SODIUM 30 MG/0.3ML ~~LOC~~ SOLN: 30.0000 mg | SUBCUTANEOUS | Status: DC

## 2019-03-14 NOTE — [place] FL2 (Signed)
MEDICAID FL2 LEVEL OF CARE SCREENING TOOL     IDENTIFICATION  Patient Name: Tiffany Maxwell Birthdate: 1934-01-21 Sex: female Admission Date (Current Location): 03/11/2019  [facility] and [place] Number:  [employer] and Address:  [facility] [address] [place]      Provider Number: [id number]  Attending Physician Name and Address:  Barton Dubois, MD  Relative Name and Phone Number:       Current Level of Care: Hospital Recommended Level of Care: [place] Prior Approval Number:    Date Approved/Denied:   PASRR Number:    Discharge Plan:      Current Diagnoses: Patient Active Problem List   Diagnosis Date Noted  . Chronic respiratory failure with hypoxia ([place])   . UTI (urinary tract infection) 03/12/2019  . AMS (altered mental status) 03/12/2019  . Aspiration pneumonitis ([place]) history 12/12/2018  . Atrial fibrillation with slow ventricular response ([place]) 12/10/2018  . Sepsis due to undetermined organism ([place]) 12/10/2018  . Goals of care, counseling/discussion   . Palliative care by specialist   . DNR (do not resuscitate) discussion   . HCAP (healthcare-associated pneumonia)   . Lobar pneumonia ([place]) 06/27/2018  . COPD exacerbation ([place]) 06/26/2018  . Acute on chronic respiratory failure with hypoxia ([place]) 06/26/2018  . Primary insomnia 04/22/2018  . Hyperlipidemia 07/10/2017  . Chronic combined systolic (EF 23%) and diastolic CHF, NYHA class 1 ([place]) 07/10/2017  . COPD (chronic obstructive pulmonary disease) ([place]) 07/10/2017  . Closed comminuted intertrochanteric fracture of proximal end of right femur ([place]) 06/21/2017  . GAD (generalized anxiety disorder) 11/06/2016  . Back pain 04/08/2014  . Pulmonary emphysema ([place]) 01/30/2013  . History of tobacco use 01/30/2013  . Diastolic dysfunction 53/61/4431  . PNA (pneumonia) 01/29/2013  . Osteoporosis 01/29/2013  . Compression fracture of thoracic  spine, non-traumatic ([place]) 01/28/2013  . HTN (hypertension) 01/28/2013    Orientation RESPIRATION BLADDER Height & Weight     Self  O2(4L) Incontinent Weight: 108 lb 11 oz (49.3 kg) Height:  5\' 1"  (154.9 cm)  BEHAVIORAL SYMPTOMS/MOOD NEUROLOGICAL BOWEL NUTRITION STATUS      Incontinent Diet(Dysphagia 2 with thin liquids (heart healthy diet))  AMBULATORY STATUS COMMUNICATION OF NEEDS Skin   Extensive Assist Verbally Normal                       Personal Care Assistance Level of Assistance  Bathing, Feeding, Dressing Bathing Assistance: Maximum assistance Feeding assistance: Independent Dressing Assistance: Maximum assistance     Functional Limitations Info  Sight, Hearing, Speech Sight Info: Adequate Hearing Info: Adequate Speech Info: Adequate    SPECIAL CARE FACTORS FREQUENCY        PT Frequency: n/a              Contractures Contractures Info: Not present    Additional Factors Info  Code Status, Allergies, Psychotropic Code Status Info: DNR Allergies Info: Codeine, Penicillins, Benicar Hct, Hctz, Naproxen Psychotropic Info: Celexa, ativan, remeron         Current Medications (03/14/2019):  This is the current hospital active medication list Current Facility-Administered Medications  Medication Dose Route Frequency Provider Last Rate Last Dose  . 0.9 %  sodium chloride infusion  250 mL Intravenous PRN Phillips Grout, MD      . acetaminophen (TYLENOL) tablet 650 mg  650 mg Oral Q6H PRN Phillips Grout, MD       Or  . acetaminophen (  TYLENOL) suppository 650 mg  650 mg Rectal Q6H PRN Derrill Kay A, MD      . cefTRIAXone (ROCEPHIN) 1 g in sodium chloride 0.9 % 100 mL IVPB  1 g Intravenous Q24H Phillips Grout, MD   Stopped at 03/14/19 0154  . citalopram (CELEXA) tablet 20 mg  20 mg Oral Daily Barton Dubois, MD   20 mg at 03/14/19 0827  . [START ON 03/15/2019] enoxaparin (LOVENOX) injection 30 mg  30 mg Subcutaneous Q24H Barton Dubois, MD      . LORazepam  (ATIVAN) tablet 0.25 mg  0.25 mg Oral Q8H PRN Barton Dubois, MD      . MEDLINE mouth rinse  15 mL Mouth Rinse BID Derrill Kay A, MD   15 mL at 03/14/19 0830  . metoprolol tartrate (LOPRESSOR) 25 mg/10 mL oral suspension 6.25 mg  6.25 mg Oral BID Barton Dubois, MD   6.25 mg at 03/14/19 1019  . metroNIDAZOLE (FLAGYL) IVPB 500 mg  500 mg Intravenous Q6H Derrill Kay A, MD 100 mL/hr at 03/14/19 0827 500 mg at 03/14/19 0827  . mirtazapine (REMERON) tablet 15 mg  15 mg Oral QHS Barton Dubois, MD   15 mg at 03/13/19 2208  . ondansetron (ZOFRAN) tablet 4 mg  4 mg Oral Q6H PRN Phillips Grout, MD       Or  . ondansetron (ZOFRAN) injection 4 mg  4 mg Intravenous Q6H PRN Phillips Grout, MD      . pantoprazole (PROTONIX) EC tablet 40 mg  40 mg Oral Daily Barton Dubois, MD   40 mg at 03/14/19 0827  . sodium chloride flush (NS) 0.9 % injection 3 mL  3 mL Intravenous Q12H Derrill Kay A, MD   3 mL at 03/14/19 0831  . sodium chloride flush (NS) 0.9 % injection 3 mL  3 mL Intravenous PRN Phillips Grout, MD         Discharge Medications: Please see discharge summary for a list of discharge medications.  Relevant Imaging Results:  Relevant Lab Results:   Additional Information    Vayla Wilhelmi, Clydene Pugh, LCSW

## 2019-03-14 NOTE — Discharge Summary (Signed)
Physician Discharge Summary  Tiffany Maxwell OFB:[id number] DOB: 1934-05-24 DOA: 03/11/2019  PCP: [name], FNP  Admit date: 03/11/2019 Discharge date: 03/14/2019  Time spent: 35 minutes  Recommendations for Outpatient Follow-up:  1. Repeat basic metabolic panel in 5 days to follow electrolytes and renal function. 2. Follow final results on patient's urine culture with further adjustment to antibiotic therapy if needed  Discharge Diagnoses:  Principal Problem:   UTI (urinary tract infection) Active Problems:   HTN (hypertension)   Chronic combined systolic (EF 78%) and diastolic CHF, NYHA class 1 (HCC)   COPD (chronic obstructive pulmonary disease) (HCC)   AMS (altered mental status)   Chronic respiratory failure with hypoxia (HCC) Chronic kidney disease stage III -Dysphagia  Discharge Condition: Stable and improved.  Patient will be discharged back to skilled nursing facility for further care and rehabilitation.  Diet recommendation: Dysphagia 2 with thin liquids (heart healthy diet).  Filed Weights   03/12/19 0615 03/13/19 0527 03/14/19 0500  Weight: 46.7 kg 47.9 kg 49.3 kg    History of present illness:  As per H&P written by Dr. Shanon Maxwell on 03/12/2019 85 y.o. female with medical history significant of report of dementia, congestive heart failure, COPD, chronic respiratory failure on 4 L of oxygen at [facility] brought in for being found confused and covered in feces with foul-smelling urine.  Patient admitted for further evaluation and management of toxic encephalopathy in the setting of UTI.  Hospital Course:  1-Klebsiella pneumonia UTI (urinary tract infection) -Improved and is stable -We will discharge on Ceftin 250 mg twice a day for 5 more days to complete antibiotic therapy -Final culture results still pending; will continue to follow and adjust further if needed antibiotic therapy -Discharge back to skilled nursing facility for further care and  rehabilitation.  2-toxic encephalopathy -Patient with underlying history of dementia -Continue supportive care -Continue treatment for UTI. -Overall improved and most likely back to baseline -oriented X 2 and able to follow commands appropriately   3-HTN (hypertension) -Continue metoprolol and lisinopril -Follow low-sodium diet -Blood pressure stable and well-controlled.  4-Chronic combined systolic (EF 24%) and diastolic CHF, NYHA class 1 (HCC) -Euvolemic -Continue beta-blockers -Continue checking weight on daily basis and follow low-sodium diet.    5-chronic respiratory failure in the setting of COPD (chronic obstructive pulmonary disease) (HCC) -Continue oxygen supplementation (4 L through nasal cannula at baseline) -Continue home inhaler regimen. -No signs of acute exacerbation currently.  6-history of dementia with underlying behavioral disturbances -Overall stable and without any signs of agitation. -Continue supportive care -Continue Remeron and Lexapro  7-GERD -Continue PPI  8-chronic kidney disease history 3 -Stable with creatinine at baseline -Repeat basic metabolic panel in the next 5 days to reassess electrolytes and renal function. -Maintain adequate hydration.  9-dysphagia -In the setting of underlying cognitive impairment -Continue dysphagia 2 diet with thin liquids -Appreciate evaluation and recommendations by speech therapy.    Procedures:  See below for x-ray reports.  Consultations:  None  Discharge Exam: Vitals:   03/14/19 0539 03/14/19 1019  BP: (!) 155/70 (!) 160/78  Pulse: (!) 57 (!) 124  Resp: 16 16  Temp: 98.3 F (36.8 C) 98.5 F (36.9 C)  SpO2: 99% 97%    General: Alert, awake and oriented x2; afebrile, following commands appropriately.  No nausea, no vomiting, no diarrhea.  Denies chest pain or shortness of breath. Cardiovascular: S1 and S2, no rubs, no gallops. Respiratory: Good air movement bilaterally, no wheezing, no  crackles.  Good  oxygen saturation on chronic 4 L nasal cannula supplementation. Abdomen: Soft, nontender, positive bowel sounds, no guarding. Extremities: No edema, no cyanosis, no clubbing.  Discharge Instructions   Discharge Instructions    Diet - low sodium heart healthy   Complete by:  As directed    Discharge instructions   Complete by:  As directed    Follow dysphagia 2 with thin liquids Maintain adequate hydration Take medications as prescribed Follow-up with PCP in 10 days     Allergies as of 03/14/2019      Reactions   Codeine Other (See Comments)   Headache   Penicillins    Has patient had a PCN reaction causing immediate rash, facial/tongue/throat swelling, SOB or lightheadedness with hypotension: Yes Has patient had a PCN reaction causing severe rash involving mucus membranes or skin necrosis: No Has patient had a PCN reaction that required hospitalization: Unknown Has patient had a PCN reaction occurring within the last 10 years: Unknown If all of the above answers are "NO", then may proceed with Cephalosporin use.   Benicar Hct [olmesartan Medoxomil-hctz] Swelling, Rash   Hctz [hydrochlorothiazide] Swelling, Rash   Naproxen Itching, Rash, Other (See Comments)   Redness/flushing of skin      Medication List    TAKE these medications   acetaminophen 325 MG tablet Commonly known as:  TYLENOL Take 2 tablets (650 mg total) by mouth every 6 (six) hours as needed for mild pain or headache (or Fever >/= 101).   cefUROXime 250 MG tablet Commonly known as:  Ceftin Take 1 tablet (250 mg total) by mouth 2 (two) times daily for 5 days.   citalopram 20 MG tablet Commonly known as:  CELEXA Take 20 mg by mouth daily.   Ipratropium-Albuterol 20-100 MCG/ACT Aers respimat Commonly known as:  Combivent Respimat Inhale 1 puff into the lungs every 6 (six) hours as needed for wheezing.   lisinopril 20 MG tablet Commonly known as:  PRINIVIL,ZESTRIL Take 20 mg by mouth  daily.   LORazepam 0.5 MG tablet Commonly known as:  ATIVAN Take 0.5 tablets (0.25 mg total) by mouth every 8 (eight) hours as needed for anxiety. What changed:    when to take this  reasons to take this   metoprolol succinate 25 MG 24 hr tablet Commonly known as:  TOPROL-XL Take 12.5 mg by mouth daily.   mirtazapine 15 MG tablet Commonly known as:  REMERON Take 15 mg by mouth at bedtime.   morphine 20 MG/5ML solution Take 1.3 mLs (5.2 mg total) by mouth every 3 (three) hours as needed (severe pain). What changed:    how much to take  when to take this  reasons to take this   omeprazole 20 MG capsule Commonly known as:  PRILOSEC Take 20 mg by mouth daily.   OXYGEN Inhale 4 L into the lungs See admin instructions. TO MAINTAIN SATS OF 93% OR GREATER   polyethylene glycol powder powder Commonly known as:  GLYCOLAX/MIRALAX Take 17 g by mouth daily as needed for mild constipation (hold for diarrhea). What changed:    when to take this  reasons to take this   Ventolin HFA 108 (90 Base) MCG/ACT inhaler Generic drug:  albuterol 2 PUFFS EVERY 6 HOURS AS NEEDED FOR WHEEZING OR SHORTNESS OF BREATH What changed:  See the new instructions.      Allergies  Allergen Reactions  . Codeine Other (See Comments)    Headache   . Penicillins     Has patient had  a PCN reaction causing immediate rash, facial/tongue/throat swelling, SOB or lightheadedness with hypotension: Yes Has patient had a PCN reaction causing severe rash involving mucus membranes or skin necrosis: No Has patient had a PCN reaction that required hospitalization: Unknown Has patient had a PCN reaction occurring within the last 10 years: Unknown If all of the above answers are "NO", then may proceed with Cephalosporin use.  Marland Kitchen Benicar Hct [Olmesartan Medoxomil-Hctz] Swelling and Rash  . Hctz [Hydrochlorothiazide] Swelling and Rash  . Naproxen Itching, Rash and Other (See Comments)    Redness/flushing of  skin   Follow-up Information    [name], FNP. Schedule an appointment as soon as possible for a visit in 10 day(s).   Specialty:  Family Medicine Contact information: [place] [address] [phone]           The results of significant diagnostics from this hospitalization (including imaging, microbiology, ancillary and laboratory) are listed below for reference.    Significant Diagnostic Studies: Dg Chest 1 View  Result Date: 03/11/2019 CLINICAL DATA:  Altered mental status. EXAM: CHEST  1 VIEW COMPARISON:  12/09/2018 FINDINGS: Normal heart size. Tortuous and unfolded thoracic aorta. Both lungs are clear. The visualized skeletal structures are unremarkable. IMPRESSION: No active disease. Electronically Signed   By: Kerby Moors M.D.   On: 03/11/2019 21:02   Ct Head Wo Contrast  Result Date: 03/11/2019 CLINICAL DATA:  85 year old female with confusion. EXAM: CT HEAD WITHOUT CONTRAST TECHNIQUE: Contiguous axial images were obtained from the base of the skull through the vertex without intravenous contrast. COMPARISON:  Head CT dated 03/11/2014 FINDINGS: Evaluation of this exam is limited due to motion artifact. Brain: There is mild age-related atrophy and moderate chronic microvascular ischemic changes. An area of hypodensity in the left parietal convexity (series 2, image 25) likely represents chronic microvascular ischemic changes. If there is high clinical concern for acute ischemia, further evaluation with MRI is recommended. There is no acute intracranial hemorrhage. No mass effect or midline shift. No extra-axial fluid collection. Vascular: No hyperdense vessel or unexpected calcification. Skull: Normal. Negative for fracture or focal lesion. Sinuses/Orbits: The visualized paranasal sinuses are clear. There is partial opacification of the left mastoid air cells. The right mastoid air cells are clear. Other: None IMPRESSION: 1. No acute intracranial  hemorrhage. 2. Age-related atrophy and chronic microvascular ischemic changes. If there is high clinical concern for acute ischemia further evaluation with MRI is recommended. Electronically Signed   By: Anner Crete M.D.   On: 03/11/2019 21:06   Ct Chest Wo Contrast  Result Date: 03/11/2019 CLINICAL DATA:  85 year old female with confusion, altered mental status, and weakness. EXAM: CT CHEST WITHOUT CONTRAST TECHNIQUE: Multidetector CT imaging of the chest was performed following the standard protocol without IV contrast. COMPARISON:  Chest radiograph dated 03/11/2019 FINDINGS: Evaluation of this exam is limited in the absence of intravenous contrast. Evaluation is also limited due to respiratory motion artifact. Cardiovascular: There is mild cardiomegaly. Multi vessel coronary vascular calcifications noted. There is moderate atherosclerotic calcification of the thoracic aorta. The thoracic aorta is tortuous. The central pulmonary arteries are grossly unremarkable. Mediastinum/Nodes: No hilar or mediastinal adenopathy. The esophagus is grossly unremarkable. No mediastinal fluid collection. Lungs/Pleura: There is moderate centrilobular emphysema. Patchy subsegmental consolidative change at the right lung base may represent atelectasis or scarring. Pneumonia is not excluded. Clinical correlation is recommended. Aspiration is favored less likely. There are biapical subpleural scarring. There is no pleural effusion or pneumothorax. The  central airways are patent. Upper Abdomen: No acute abnormality. Musculoskeletal: Osteopenia. Multilevel age indeterminate but chronic appearing thoracic spine compression fractures. No retropulsion. IMPRESSION: 1. Right lower lobe subsegmental consolidation, likely atelectasis versus infiltrate. Aspiration is considered less likely. 2. Moderate centrilobular emphysema. 3. Mild cardiomegaly with multi vessel coronary vascular calcification and Aortic Atherosclerosis  (ICD10-I70.0). 4. Osteopenia with multilevel age indeterminate but chronic appearing thoracic spine compression fractures. Electronically Signed   By: Anner Crete M.D.   On: 03/11/2019 23:17    Microbiology: Recent Results (from the past 240 hour(s))  Urine culture     Status: Abnormal (Preliminary result)   Collection Time: 03/11/19  8:18 PM  Result Value Ref Range Status   Specimen Description   Final    URINE, CLEAN CATCH Performed at [facility], [address], [place], [place] [address]    Special Requests   Final    NONE Performed at [facility], [address] [place], [place] [address]    Culture (A)  Final    >=100,000 COLONIES/mL KLEBSIELLA PNEUMONIAE SUSCEPTIBILITIES TO FOLLOW Performed at [facility] [address], [place], [place] [address]    Report Status PENDING  Incomplete     Labs: Basic Metabolic Panel: Recent Labs  Lab 03/11/19 2018 03/12/19 0443 03/13/19 0438  NA 135 136 139  K 4.2 3.7 3.6  CL 87* 89* 93*  CO2 41* 38* 37*  GLUCOSE 112* 90 67*  BUN 11 8 10   CREATININE 0.38* 0.31* 0.31*  CALCIUM 9.8 9.4 9.3   Liver Function Tests: Recent Labs  Lab 03/11/19 2018  AST 22  ALT 7  ALKPHOS 68  BILITOT 0.2*  PROT 6.6  ALBUMIN 3.3*   CBC: Recent Labs  Lab 03/11/19 2018 03/12/19 0443  WBC 10.2 7.9  NEUTROABS 8.7*  --   HGB 11.4* 10.5*  HCT 35.1* 34.1*  MCV 92.1 92.7  PLT 144* 139*   Cardiac Enzymes: Recent Labs  Lab 03/11/19 2018  TROPONINI <0.03   BNP: BNP (last 3 results) Recent Labs    11/08/18 0436 12/01/18 1020 03/11/19 2018  BNP 95.0 127.0* 144.0*    Signed:  Barton Dubois MD.  [facility] 03/14/2019, 12:21 PM

## 2019-03-14 NOTE — Clinical Social Work Note (Signed)
Patient is a LTC resident at [facility] and will return. RN has spoken with son about discharge. Discharge clinicals sent to facility and message left for [facility] regarding discharge.   LCSW signing off.      Sitlaly Gudiel, Clydene Pugh, LCSW

## 2019-03-14 NOTE — Progress Notes (Signed)
Report called and given to [place], [place]. All questions were answered and no further questions at this time. Pt in stable condition and in no acute distress at time of discharge. Pt will be transported by [facility].

## 2019-03-15 LAB — URINE CULTURE: Culture: >=100000 — AB

## 2019-03-15 LAB — CARBAPENEM RESISTANCE PANEL
CARBA RESISTANCE OXA48 GENE: NOT DETECTED
Carba Resistance IMP Gene: NOT DETECTED
Carba Resistance KPC Gene: DETECTED — AB
Carba Resistance NDM Gene: NOT DETECTED
Carba Resistance VIM Gene: NOT DETECTED

## 2019-03-15 NOTE — Progress Notes (Signed)
Received called with final results on urine culture demonstrating ESBL Klebsiella Pneumoniae carbapenem resistance and intermediately resistance to ceftriaxone; case discussed with ID (Dr. Linus Salmons), recommendations given to complete therapy with cephalosporin, as patient responded satisfactorily.  Barton Dubois MD [phone]

## 2019-03-19 DIAGNOSIS — E43 Unspecified severe protein-calorie malnutrition: Secondary | ICD-10-CM | POA: Diagnosis not present

## 2019-03-19 DIAGNOSIS — G92 Toxic encephalopathy: Secondary | ICD-10-CM | POA: Diagnosis not present

## 2019-03-19 DIAGNOSIS — R634 Abnormal weight loss: Secondary | ICD-10-CM | POA: Diagnosis not present

## 2019-03-19 DIAGNOSIS — I5042 Chronic combined systolic (congestive) and diastolic (congestive) heart failure: Secondary | ICD-10-CM | POA: Diagnosis not present

## 2019-03-26 DIAGNOSIS — J441 Chronic obstructive pulmonary disease with (acute) exacerbation: Secondary | ICD-10-CM | POA: Diagnosis not present

## 2019-03-26 DIAGNOSIS — K210 Gastro-esophageal reflux disease with esophagitis: Secondary | ICD-10-CM | POA: Diagnosis not present

## 2019-03-26 DIAGNOSIS — F339 Major depressive disorder, recurrent, unspecified: Secondary | ICD-10-CM | POA: Diagnosis not present

## 2019-03-26 DIAGNOSIS — F419 Anxiety disorder, unspecified: Secondary | ICD-10-CM | POA: Diagnosis not present

## 2019-05-03 DIAGNOSIS — R627 Adult failure to thrive: Secondary | ICD-10-CM | POA: Diagnosis not present

## 2019-05-03 DIAGNOSIS — R634 Abnormal weight loss: Secondary | ICD-10-CM | POA: Diagnosis not present

## 2019-05-03 DIAGNOSIS — J439 Emphysema, unspecified: Secondary | ICD-10-CM | POA: Diagnosis not present

## 2019-05-03 DIAGNOSIS — F419 Anxiety disorder, unspecified: Secondary | ICD-10-CM | POA: Diagnosis not present

## 2019-05-03 DIAGNOSIS — R1310 Dysphagia, unspecified: Secondary | ICD-10-CM | POA: Diagnosis not present

## 2019-05-12 DIAGNOSIS — Z1383 Encounter for screening for respiratory disorder NEC: Secondary | ICD-10-CM | POA: Diagnosis not present

## 2019-05-12 DIAGNOSIS — Z20828 Contact with and (suspected) exposure to other viral communicable diseases: Secondary | ICD-10-CM | POA: Diagnosis not present

## 2019-05-16 DIAGNOSIS — R627 Adult failure to thrive: Secondary | ICD-10-CM | POA: Diagnosis not present

## 2019-05-16 DIAGNOSIS — I130 Hypertensive heart and chronic kidney disease with heart failure and stage 1 through stage 4 chronic kidney disease, or unspecified chronic kidney disease: Secondary | ICD-10-CM | POA: Diagnosis not present

## 2019-05-16 DIAGNOSIS — J439 Emphysema, unspecified: Secondary | ICD-10-CM | POA: Diagnosis not present

## 2019-05-16 DIAGNOSIS — K219 Gastro-esophageal reflux disease without esophagitis: Secondary | ICD-10-CM | POA: Diagnosis not present

## 2019-05-28 DIAGNOSIS — J439 Emphysema, unspecified: Secondary | ICD-10-CM | POA: Diagnosis not present

## 2019-05-28 DIAGNOSIS — K219 Gastro-esophageal reflux disease without esophagitis: Secondary | ICD-10-CM | POA: Diagnosis not present

## 2019-05-28 DIAGNOSIS — R627 Adult failure to thrive: Secondary | ICD-10-CM | POA: Diagnosis not present

## 2019-05-28 DIAGNOSIS — R634 Abnormal weight loss: Secondary | ICD-10-CM | POA: Diagnosis not present

## 2019-06-11 DIAGNOSIS — F0391 Unspecified dementia with behavioral disturbance: Secondary | ICD-10-CM | POA: Diagnosis not present

## 2019-06-12 DIAGNOSIS — F0391 Unspecified dementia with behavioral disturbance: Secondary | ICD-10-CM | POA: Diagnosis not present

## 2019-06-13 DIAGNOSIS — F0391 Unspecified dementia with behavioral disturbance: Secondary | ICD-10-CM | POA: Diagnosis not present

## 2019-06-14 DIAGNOSIS — F0391 Unspecified dementia with behavioral disturbance: Secondary | ICD-10-CM | POA: Diagnosis not present

## 2019-06-16 DIAGNOSIS — F0391 Unspecified dementia with behavioral disturbance: Secondary | ICD-10-CM | POA: Diagnosis not present

## 2019-06-17 DIAGNOSIS — F0391 Unspecified dementia with behavioral disturbance: Secondary | ICD-10-CM | POA: Diagnosis not present

## 2019-06-18 DIAGNOSIS — F0391 Unspecified dementia with behavioral disturbance: Secondary | ICD-10-CM | POA: Diagnosis not present

## 2019-06-19 DIAGNOSIS — F0391 Unspecified dementia with behavioral disturbance: Secondary | ICD-10-CM | POA: Diagnosis not present

## 2019-06-20 DIAGNOSIS — F0391 Unspecified dementia with behavioral disturbance: Secondary | ICD-10-CM | POA: Diagnosis not present

## 2019-06-21 DIAGNOSIS — F0391 Unspecified dementia with behavioral disturbance: Secondary | ICD-10-CM | POA: Diagnosis not present

## 2019-06-23 DIAGNOSIS — F0391 Unspecified dementia with behavioral disturbance: Secondary | ICD-10-CM | POA: Diagnosis not present

## 2019-06-25 DIAGNOSIS — R627 Adult failure to thrive: Secondary | ICD-10-CM | POA: Diagnosis not present

## 2019-06-25 DIAGNOSIS — R1310 Dysphagia, unspecified: Secondary | ICD-10-CM | POA: Diagnosis not present

## 2019-06-25 DIAGNOSIS — J439 Emphysema, unspecified: Secondary | ICD-10-CM | POA: Diagnosis not present

## 2019-06-25 DIAGNOSIS — F0391 Unspecified dementia with behavioral disturbance: Secondary | ICD-10-CM | POA: Diagnosis not present

## 2019-06-25 DIAGNOSIS — K219 Gastro-esophageal reflux disease without esophagitis: Secondary | ICD-10-CM | POA: Diagnosis not present

## 2019-06-26 DIAGNOSIS — F0391 Unspecified dementia with behavioral disturbance: Secondary | ICD-10-CM | POA: Diagnosis not present

## 2019-06-27 DIAGNOSIS — F0391 Unspecified dementia with behavioral disturbance: Secondary | ICD-10-CM | POA: Diagnosis not present

## 2019-06-28 DIAGNOSIS — F0391 Unspecified dementia with behavioral disturbance: Secondary | ICD-10-CM | POA: Diagnosis not present

## 2019-06-30 DIAGNOSIS — F0391 Unspecified dementia with behavioral disturbance: Secondary | ICD-10-CM | POA: Diagnosis not present

## 2019-06-30 DIAGNOSIS — Z961 Presence of intraocular lens: Secondary | ICD-10-CM | POA: Diagnosis not present

## 2019-06-30 DIAGNOSIS — H353133 Nonexudative age-related macular degeneration, bilateral, advanced atrophic without subfoveal involvement: Secondary | ICD-10-CM | POA: Diagnosis not present

## 2019-07-01 DIAGNOSIS — F0391 Unspecified dementia with behavioral disturbance: Secondary | ICD-10-CM | POA: Diagnosis not present

## 2019-07-02 DIAGNOSIS — F0391 Unspecified dementia with behavioral disturbance: Secondary | ICD-10-CM | POA: Diagnosis not present

## 2019-07-03 DIAGNOSIS — F0391 Unspecified dementia with behavioral disturbance: Secondary | ICD-10-CM | POA: Diagnosis not present

## 2019-07-10 DIAGNOSIS — R1310 Dysphagia, unspecified: Secondary | ICD-10-CM | POA: Diagnosis not present

## 2019-07-11 ENCOUNTER — Other Ambulatory Visit: Payer: Self-pay

## 2019-07-11 DIAGNOSIS — R1310 Dysphagia, unspecified: Secondary | ICD-10-CM | POA: Diagnosis not present

## 2019-07-14 DIAGNOSIS — M6281 Muscle weakness (generalized): Secondary | ICD-10-CM | POA: Diagnosis not present

## 2019-07-14 DIAGNOSIS — R1310 Dysphagia, unspecified: Secondary | ICD-10-CM | POA: Diagnosis not present

## 2019-07-14 DIAGNOSIS — J449 Chronic obstructive pulmonary disease, unspecified: Secondary | ICD-10-CM | POA: Diagnosis not present

## 2019-07-15 DIAGNOSIS — M6281 Muscle weakness (generalized): Secondary | ICD-10-CM | POA: Diagnosis not present

## 2019-07-15 DIAGNOSIS — R1310 Dysphagia, unspecified: Secondary | ICD-10-CM | POA: Diagnosis not present

## 2019-07-15 DIAGNOSIS — J449 Chronic obstructive pulmonary disease, unspecified: Secondary | ICD-10-CM | POA: Diagnosis not present

## 2019-07-16 DIAGNOSIS — J449 Chronic obstructive pulmonary disease, unspecified: Secondary | ICD-10-CM | POA: Diagnosis not present

## 2019-07-16 DIAGNOSIS — R1310 Dysphagia, unspecified: Secondary | ICD-10-CM | POA: Diagnosis not present

## 2019-07-16 DIAGNOSIS — M6281 Muscle weakness (generalized): Secondary | ICD-10-CM | POA: Diagnosis not present

## 2019-07-17 DIAGNOSIS — R1310 Dysphagia, unspecified: Secondary | ICD-10-CM | POA: Diagnosis not present

## 2019-07-17 DIAGNOSIS — J449 Chronic obstructive pulmonary disease, unspecified: Secondary | ICD-10-CM | POA: Diagnosis not present

## 2019-07-17 DIAGNOSIS — M6281 Muscle weakness (generalized): Secondary | ICD-10-CM | POA: Diagnosis not present

## 2019-07-18 DIAGNOSIS — M6281 Muscle weakness (generalized): Secondary | ICD-10-CM | POA: Diagnosis not present

## 2019-07-18 DIAGNOSIS — J449 Chronic obstructive pulmonary disease, unspecified: Secondary | ICD-10-CM | POA: Diagnosis not present

## 2019-07-18 DIAGNOSIS — R1310 Dysphagia, unspecified: Secondary | ICD-10-CM | POA: Diagnosis not present

## 2019-07-21 DIAGNOSIS — M6281 Muscle weakness (generalized): Secondary | ICD-10-CM | POA: Diagnosis not present

## 2019-07-21 DIAGNOSIS — R1310 Dysphagia, unspecified: Secondary | ICD-10-CM | POA: Diagnosis not present

## 2019-07-21 DIAGNOSIS — J449 Chronic obstructive pulmonary disease, unspecified: Secondary | ICD-10-CM | POA: Diagnosis not present

## 2019-07-22 DIAGNOSIS — J449 Chronic obstructive pulmonary disease, unspecified: Secondary | ICD-10-CM | POA: Diagnosis not present

## 2019-07-22 DIAGNOSIS — M6281 Muscle weakness (generalized): Secondary | ICD-10-CM | POA: Diagnosis not present

## 2019-07-22 DIAGNOSIS — R1310 Dysphagia, unspecified: Secondary | ICD-10-CM | POA: Diagnosis not present

## 2019-07-23 DIAGNOSIS — M6281 Muscle weakness (generalized): Secondary | ICD-10-CM | POA: Diagnosis not present

## 2019-07-23 DIAGNOSIS — R1310 Dysphagia, unspecified: Secondary | ICD-10-CM | POA: Diagnosis not present

## 2019-07-23 DIAGNOSIS — J449 Chronic obstructive pulmonary disease, unspecified: Secondary | ICD-10-CM | POA: Diagnosis not present

## 2019-07-24 DIAGNOSIS — M6281 Muscle weakness (generalized): Secondary | ICD-10-CM | POA: Diagnosis not present

## 2019-07-24 DIAGNOSIS — J449 Chronic obstructive pulmonary disease, unspecified: Secondary | ICD-10-CM | POA: Diagnosis not present

## 2019-07-24 DIAGNOSIS — Z1383 Encounter for screening for respiratory disorder NEC: Secondary | ICD-10-CM | POA: Diagnosis not present

## 2019-07-24 DIAGNOSIS — R1310 Dysphagia, unspecified: Secondary | ICD-10-CM | POA: Diagnosis not present

## 2019-07-24 DIAGNOSIS — Z20828 Contact with and (suspected) exposure to other viral communicable diseases: Secondary | ICD-10-CM | POA: Diagnosis not present

## 2019-07-25 DIAGNOSIS — R1310 Dysphagia, unspecified: Secondary | ICD-10-CM | POA: Diagnosis not present

## 2019-07-25 DIAGNOSIS — J449 Chronic obstructive pulmonary disease, unspecified: Secondary | ICD-10-CM | POA: Diagnosis not present

## 2019-07-25 DIAGNOSIS — M6281 Muscle weakness (generalized): Secondary | ICD-10-CM | POA: Diagnosis not present

## 2019-07-28 DIAGNOSIS — R1310 Dysphagia, unspecified: Secondary | ICD-10-CM | POA: Diagnosis not present

## 2019-07-28 DIAGNOSIS — J449 Chronic obstructive pulmonary disease, unspecified: Secondary | ICD-10-CM | POA: Diagnosis not present

## 2019-07-28 DIAGNOSIS — M6281 Muscle weakness (generalized): Secondary | ICD-10-CM | POA: Diagnosis not present

## 2019-07-29 DIAGNOSIS — J449 Chronic obstructive pulmonary disease, unspecified: Secondary | ICD-10-CM | POA: Diagnosis not present

## 2019-07-29 DIAGNOSIS — R1310 Dysphagia, unspecified: Secondary | ICD-10-CM | POA: Diagnosis not present

## 2019-07-29 DIAGNOSIS — M6281 Muscle weakness (generalized): Secondary | ICD-10-CM | POA: Diagnosis not present

## 2019-07-30 DIAGNOSIS — J449 Chronic obstructive pulmonary disease, unspecified: Secondary | ICD-10-CM | POA: Diagnosis not present

## 2019-07-30 DIAGNOSIS — M6281 Muscle weakness (generalized): Secondary | ICD-10-CM | POA: Diagnosis not present

## 2019-07-30 DIAGNOSIS — R1310 Dysphagia, unspecified: Secondary | ICD-10-CM | POA: Diagnosis not present

## 2019-07-31 DIAGNOSIS — J449 Chronic obstructive pulmonary disease, unspecified: Secondary | ICD-10-CM | POA: Diagnosis not present

## 2019-07-31 DIAGNOSIS — R1310 Dysphagia, unspecified: Secondary | ICD-10-CM | POA: Diagnosis not present

## 2019-07-31 DIAGNOSIS — M6281 Muscle weakness (generalized): Secondary | ICD-10-CM | POA: Diagnosis not present

## 2019-08-01 DIAGNOSIS — R1310 Dysphagia, unspecified: Secondary | ICD-10-CM | POA: Diagnosis not present

## 2019-08-01 DIAGNOSIS — J449 Chronic obstructive pulmonary disease, unspecified: Secondary | ICD-10-CM | POA: Diagnosis not present

## 2019-08-01 DIAGNOSIS — M6281 Muscle weakness (generalized): Secondary | ICD-10-CM | POA: Diagnosis not present

## 2019-08-04 DIAGNOSIS — M6281 Muscle weakness (generalized): Secondary | ICD-10-CM | POA: Diagnosis not present

## 2019-08-04 DIAGNOSIS — Z1383 Encounter for screening for respiratory disorder NEC: Secondary | ICD-10-CM | POA: Diagnosis not present

## 2019-08-04 DIAGNOSIS — R1310 Dysphagia, unspecified: Secondary | ICD-10-CM | POA: Diagnosis not present

## 2019-08-04 DIAGNOSIS — Z20828 Contact with and (suspected) exposure to other viral communicable diseases: Secondary | ICD-10-CM | POA: Diagnosis not present

## 2019-08-04 DIAGNOSIS — J449 Chronic obstructive pulmonary disease, unspecified: Secondary | ICD-10-CM | POA: Diagnosis not present

## 2019-08-05 DIAGNOSIS — R1310 Dysphagia, unspecified: Secondary | ICD-10-CM | POA: Diagnosis not present

## 2019-08-05 DIAGNOSIS — J449 Chronic obstructive pulmonary disease, unspecified: Secondary | ICD-10-CM | POA: Diagnosis not present

## 2019-08-05 DIAGNOSIS — M6281 Muscle weakness (generalized): Secondary | ICD-10-CM | POA: Diagnosis not present

## 2019-08-06 DIAGNOSIS — J449 Chronic obstructive pulmonary disease, unspecified: Secondary | ICD-10-CM | POA: Diagnosis not present

## 2019-08-06 DIAGNOSIS — R1310 Dysphagia, unspecified: Secondary | ICD-10-CM | POA: Diagnosis not present

## 2019-08-06 DIAGNOSIS — M6281 Muscle weakness (generalized): Secondary | ICD-10-CM | POA: Diagnosis not present

## 2019-08-07 DIAGNOSIS — M6281 Muscle weakness (generalized): Secondary | ICD-10-CM | POA: Diagnosis not present

## 2019-08-07 DIAGNOSIS — J449 Chronic obstructive pulmonary disease, unspecified: Secondary | ICD-10-CM | POA: Diagnosis not present

## 2019-08-07 DIAGNOSIS — R1310 Dysphagia, unspecified: Secondary | ICD-10-CM | POA: Diagnosis not present

## 2019-08-08 DIAGNOSIS — R1310 Dysphagia, unspecified: Secondary | ICD-10-CM | POA: Diagnosis not present

## 2019-08-08 DIAGNOSIS — M6281 Muscle weakness (generalized): Secondary | ICD-10-CM | POA: Diagnosis not present

## 2019-08-08 DIAGNOSIS — J449 Chronic obstructive pulmonary disease, unspecified: Secondary | ICD-10-CM | POA: Diagnosis not present

## 2019-08-09 DIAGNOSIS — R1310 Dysphagia, unspecified: Secondary | ICD-10-CM | POA: Diagnosis not present

## 2019-08-09 DIAGNOSIS — J449 Chronic obstructive pulmonary disease, unspecified: Secondary | ICD-10-CM | POA: Diagnosis not present

## 2019-08-09 DIAGNOSIS — M6281 Muscle weakness (generalized): Secondary | ICD-10-CM | POA: Diagnosis not present

## 2019-08-11 DIAGNOSIS — R1310 Dysphagia, unspecified: Secondary | ICD-10-CM | POA: Diagnosis not present

## 2019-08-11 DIAGNOSIS — J449 Chronic obstructive pulmonary disease, unspecified: Secondary | ICD-10-CM | POA: Diagnosis not present

## 2019-08-11 DIAGNOSIS — Z1383 Encounter for screening for respiratory disorder NEC: Secondary | ICD-10-CM | POA: Diagnosis not present

## 2019-08-11 DIAGNOSIS — Z20828 Contact with and (suspected) exposure to other viral communicable diseases: Secondary | ICD-10-CM | POA: Diagnosis not present

## 2019-08-11 DIAGNOSIS — M6281 Muscle weakness (generalized): Secondary | ICD-10-CM | POA: Diagnosis not present

## 2019-08-12 DIAGNOSIS — J449 Chronic obstructive pulmonary disease, unspecified: Secondary | ICD-10-CM | POA: Diagnosis not present

## 2019-08-13 DIAGNOSIS — Z978 Presence of other specified devices: Secondary | ICD-10-CM | POA: Diagnosis not present

## 2019-08-13 DIAGNOSIS — R4189 Other symptoms and signs involving cognitive functions and awareness: Secondary | ICD-10-CM | POA: Diagnosis not present

## 2019-08-13 DIAGNOSIS — R402212 Coma scale, best verbal response, none, at arrival to emergency department: Secondary | ICD-10-CM | POA: Diagnosis not present

## 2019-08-13 DIAGNOSIS — R0603 Acute respiratory distress: Secondary | ICD-10-CM | POA: Diagnosis not present

## 2019-08-13 DIAGNOSIS — G92 Toxic encephalopathy: Secondary | ICD-10-CM | POA: Diagnosis not present

## 2019-08-13 DIAGNOSIS — I130 Hypertensive heart and chronic kidney disease with heart failure and stage 1 through stage 4 chronic kidney disease, or unspecified chronic kidney disease: Secondary | ICD-10-CM | POA: Diagnosis not present

## 2019-08-13 DIAGNOSIS — R400 Somnolence: Secondary | ICD-10-CM | POA: Diagnosis not present

## 2019-08-13 DIAGNOSIS — R627 Adult failure to thrive: Secondary | ICD-10-CM | POA: Diagnosis not present

## 2019-08-13 DIAGNOSIS — J9611 Chronic respiratory failure with hypoxia: Secondary | ICD-10-CM | POA: Diagnosis not present

## 2019-08-13 DIAGNOSIS — Z888 Allergy status to other drugs, medicaments and biological substances status: Secondary | ICD-10-CM | POA: Diagnosis not present

## 2019-08-13 DIAGNOSIS — J9600 Acute respiratory failure, unspecified whether with hypoxia or hypercapnia: Secondary | ICD-10-CM | POA: Diagnosis not present

## 2019-08-13 DIAGNOSIS — R4182 Altered mental status, unspecified: Secondary | ICD-10-CM | POA: Diagnosis not present

## 2019-08-13 DIAGNOSIS — R41841 Cognitive communication deficit: Secondary | ICD-10-CM | POA: Diagnosis not present

## 2019-08-13 DIAGNOSIS — R402112 Coma scale, eyes open, never, at arrival to emergency department: Secondary | ICD-10-CM | POA: Diagnosis not present

## 2019-08-13 DIAGNOSIS — G8929 Other chronic pain: Secondary | ICD-10-CM | POA: Diagnosis not present

## 2019-08-13 DIAGNOSIS — E785 Hyperlipidemia, unspecified: Secondary | ICD-10-CM | POA: Diagnosis not present

## 2019-08-13 DIAGNOSIS — U071 COVID-19: Secondary | ICD-10-CM | POA: Diagnosis not present

## 2019-08-13 DIAGNOSIS — I110 Hypertensive heart disease with heart failure: Secondary | ICD-10-CM | POA: Diagnosis not present

## 2019-08-13 DIAGNOSIS — Z79899 Other long term (current) drug therapy: Secondary | ICD-10-CM | POA: Diagnosis not present

## 2019-08-13 DIAGNOSIS — F0391 Unspecified dementia with behavioral disturbance: Secondary | ICD-10-CM | POA: Diagnosis not present

## 2019-08-13 DIAGNOSIS — J9690 Respiratory failure, unspecified, unspecified whether with hypoxia or hypercapnia: Secondary | ICD-10-CM | POA: Diagnosis not present

## 2019-08-13 DIAGNOSIS — I739 Peripheral vascular disease, unspecified: Secondary | ICD-10-CM | POA: Diagnosis not present

## 2019-08-13 DIAGNOSIS — F419 Anxiety disorder, unspecified: Secondary | ICD-10-CM | POA: Diagnosis not present

## 2019-08-13 DIAGNOSIS — Z9981 Dependence on supplemental oxygen: Secondary | ICD-10-CM | POA: Diagnosis not present

## 2019-08-13 DIAGNOSIS — Z4682 Encounter for fitting and adjustment of non-vascular catheter: Secondary | ICD-10-CM | POA: Diagnosis not present

## 2019-08-13 DIAGNOSIS — D6959 Other secondary thrombocytopenia: Secondary | ICD-10-CM | POA: Diagnosis not present

## 2019-08-13 DIAGNOSIS — J449 Chronic obstructive pulmonary disease, unspecified: Secondary | ICD-10-CM | POA: Diagnosis not present

## 2019-08-13 DIAGNOSIS — Z880 Allergy status to penicillin: Secondary | ICD-10-CM | POA: Diagnosis not present

## 2019-08-13 DIAGNOSIS — M4855XD Collapsed vertebra, not elsewhere classified, thoracolumbar region, subsequent encounter for fracture with routine healing: Secondary | ICD-10-CM | POA: Diagnosis not present

## 2019-08-13 DIAGNOSIS — J432 Centrilobular emphysema: Secondary | ICD-10-CM | POA: Diagnosis not present

## 2019-08-13 DIAGNOSIS — E46 Unspecified protein-calorie malnutrition: Secondary | ICD-10-CM | POA: Diagnosis not present

## 2019-08-13 DIAGNOSIS — R634 Abnormal weight loss: Secondary | ICD-10-CM | POA: Diagnosis not present

## 2019-08-13 DIAGNOSIS — R1310 Dysphagia, unspecified: Secondary | ICD-10-CM | POA: Diagnosis not present

## 2019-08-13 DIAGNOSIS — Z9911 Dependence on respirator [ventilator] status: Secondary | ICD-10-CM | POA: Diagnosis not present

## 2019-08-13 DIAGNOSIS — K219 Gastro-esophageal reflux disease without esophagitis: Secondary | ICD-10-CM | POA: Diagnosis not present

## 2019-08-13 DIAGNOSIS — Z886 Allergy status to analgesic agent status: Secondary | ICD-10-CM | POA: Diagnosis not present

## 2019-08-13 DIAGNOSIS — R918 Other nonspecific abnormal finding of lung field: Secondary | ICD-10-CM | POA: Diagnosis not present

## 2019-08-13 DIAGNOSIS — N183 Chronic kidney disease, stage 3 (moderate): Secondary | ICD-10-CM | POA: Diagnosis not present

## 2019-08-13 DIAGNOSIS — R069 Unspecified abnormalities of breathing: Secondary | ICD-10-CM | POA: Diagnosis not present

## 2019-08-13 DIAGNOSIS — J439 Emphysema, unspecified: Secondary | ICD-10-CM | POA: Diagnosis not present

## 2019-08-13 DIAGNOSIS — I700 Atherosclerosis of aorta: Secondary | ICD-10-CM | POA: Diagnosis not present

## 2019-08-13 DIAGNOSIS — I4891 Unspecified atrial fibrillation: Secondary | ICD-10-CM | POA: Diagnosis not present

## 2019-08-13 DIAGNOSIS — Z66 Do not resuscitate: Secondary | ICD-10-CM | POA: Diagnosis not present

## 2019-08-13 DIAGNOSIS — I5042 Chronic combined systolic (congestive) and diastolic (congestive) heart failure: Secondary | ICD-10-CM | POA: Diagnosis not present

## 2019-08-13 DIAGNOSIS — J1289 Other viral pneumonia: Secondary | ICD-10-CM | POA: Diagnosis not present

## 2019-08-13 DIAGNOSIS — I5040 Unspecified combined systolic (congestive) and diastolic (congestive) heart failure: Secondary | ICD-10-CM | POA: Diagnosis not present

## 2019-08-13 DIAGNOSIS — R402312 Coma scale, best motor response, none, at arrival to emergency department: Secondary | ICD-10-CM | POA: Diagnosis not present

## 2019-08-13 DIAGNOSIS — G4700 Insomnia, unspecified: Secondary | ICD-10-CM | POA: Diagnosis not present

## 2019-08-13 DIAGNOSIS — Z515 Encounter for palliative care: Secondary | ICD-10-CM | POA: Diagnosis not present

## 2019-08-14 DIAGNOSIS — R627 Adult failure to thrive: Secondary | ICD-10-CM | POA: Diagnosis not present

## 2019-08-14 DIAGNOSIS — R1310 Dysphagia, unspecified: Secondary | ICD-10-CM | POA: Diagnosis not present

## 2019-08-14 DIAGNOSIS — K219 Gastro-esophageal reflux disease without esophagitis: Secondary | ICD-10-CM | POA: Diagnosis not present

## 2019-08-14 DIAGNOSIS — J439 Emphysema, unspecified: Secondary | ICD-10-CM | POA: Diagnosis not present

## 2019-08-14 DIAGNOSIS — U071 COVID-19: Secondary | ICD-10-CM | POA: Diagnosis not present

## 2019-08-16 DIAGNOSIS — U071 COVID-19: Secondary | ICD-10-CM | POA: Diagnosis not present

## 2019-08-18 DIAGNOSIS — I4891 Unspecified atrial fibrillation: Secondary | ICD-10-CM | POA: Diagnosis not present

## 2019-08-18 DIAGNOSIS — R402312 Coma scale, best motor response, none, at arrival to emergency department: Secondary | ICD-10-CM | POA: Diagnosis not present

## 2019-08-18 DIAGNOSIS — J9611 Chronic respiratory failure with hypoxia: Secondary | ICD-10-CM | POA: Diagnosis not present

## 2019-08-18 DIAGNOSIS — R0603 Acute respiratory distress: Secondary | ICD-10-CM | POA: Diagnosis not present

## 2019-08-18 DIAGNOSIS — J9690 Respiratory failure, unspecified, unspecified whether with hypoxia or hypercapnia: Secondary | ICD-10-CM | POA: Diagnosis not present

## 2019-08-18 DIAGNOSIS — Z4682 Encounter for fitting and adjustment of non-vascular catheter: Secondary | ICD-10-CM | POA: Diagnosis not present

## 2019-08-18 DIAGNOSIS — J9600 Acute respiratory failure, unspecified whether with hypoxia or hypercapnia: Secondary | ICD-10-CM | POA: Diagnosis not present

## 2019-08-18 DIAGNOSIS — U071 COVID-19: Secondary | ICD-10-CM | POA: Diagnosis not present

## 2019-08-18 DIAGNOSIS — R918 Other nonspecific abnormal finding of lung field: Secondary | ICD-10-CM | POA: Diagnosis not present

## 2019-08-18 DIAGNOSIS — R4189 Other symptoms and signs involving cognitive functions and awareness: Secondary | ICD-10-CM | POA: Diagnosis not present

## 2019-08-18 DIAGNOSIS — R400 Somnolence: Secondary | ICD-10-CM | POA: Diagnosis not present

## 2019-08-18 DIAGNOSIS — J449 Chronic obstructive pulmonary disease, unspecified: Secondary | ICD-10-CM | POA: Diagnosis not present

## 2019-08-18 DIAGNOSIS — J1289 Other viral pneumonia: Secondary | ICD-10-CM | POA: Diagnosis not present

## 2019-08-18 DIAGNOSIS — Z9911 Dependence on respirator [ventilator] status: Secondary | ICD-10-CM | POA: Diagnosis not present

## 2019-09-11 DEATH — deceased

## 2019-12-21 IMAGING — DX DG CHEST 2V
3 series · 3 of 3 positions shown · non-contrast
Comparison: Chest x-ray dated June 19, 2017.

CLINICAL DATA: Left-sided rib pain.

EXAM:
CHEST - 2 VIEW

[chest pa]
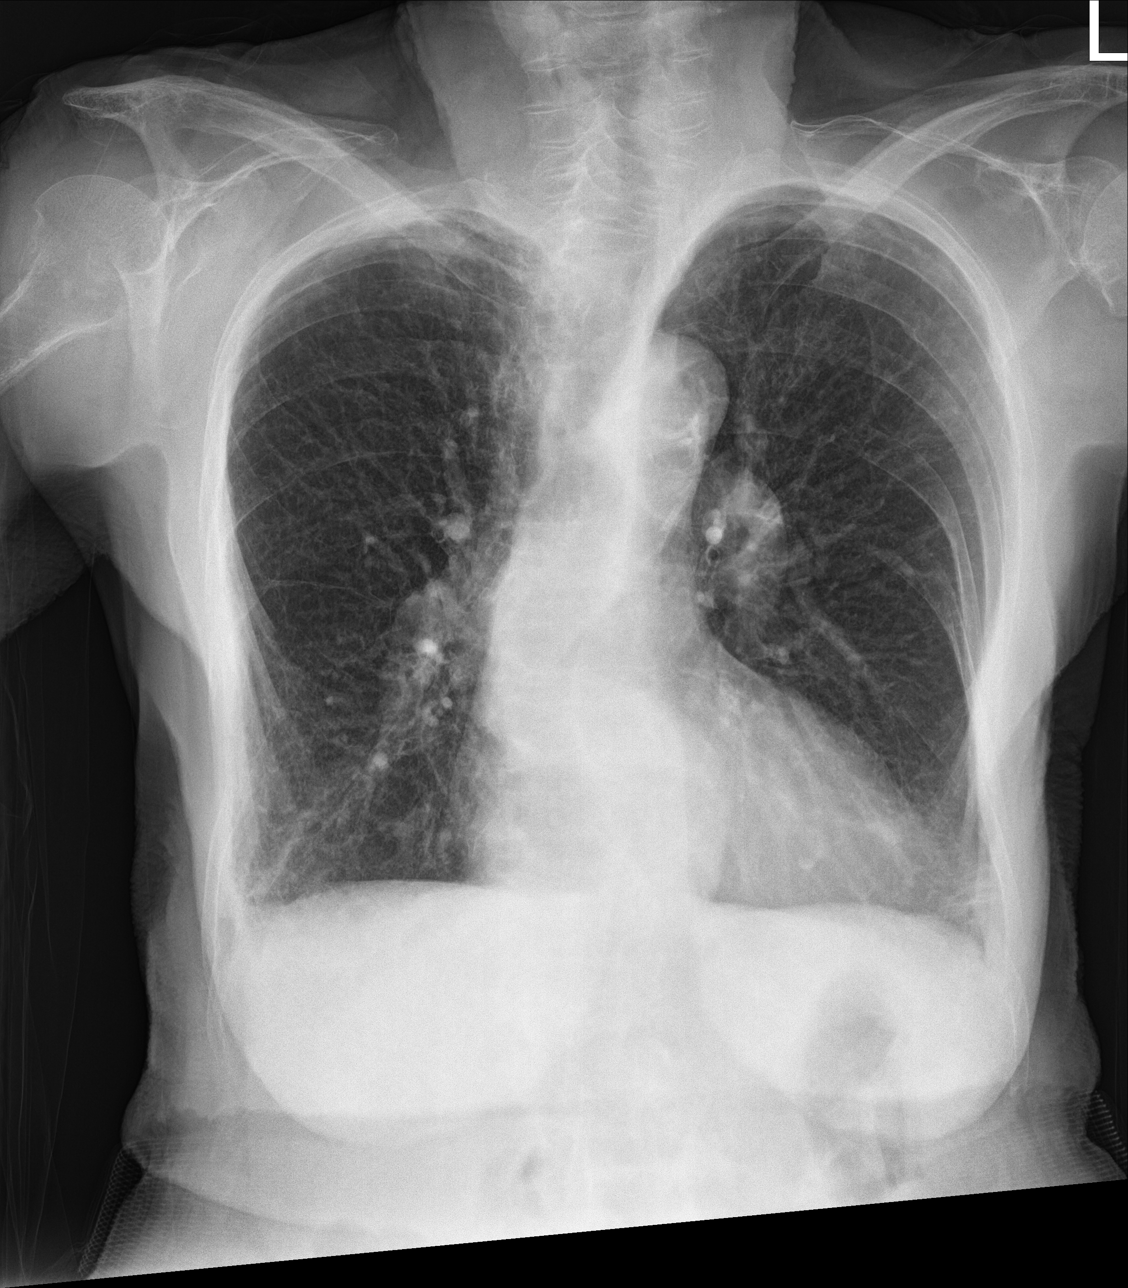

[chest lat (1 of 2)]
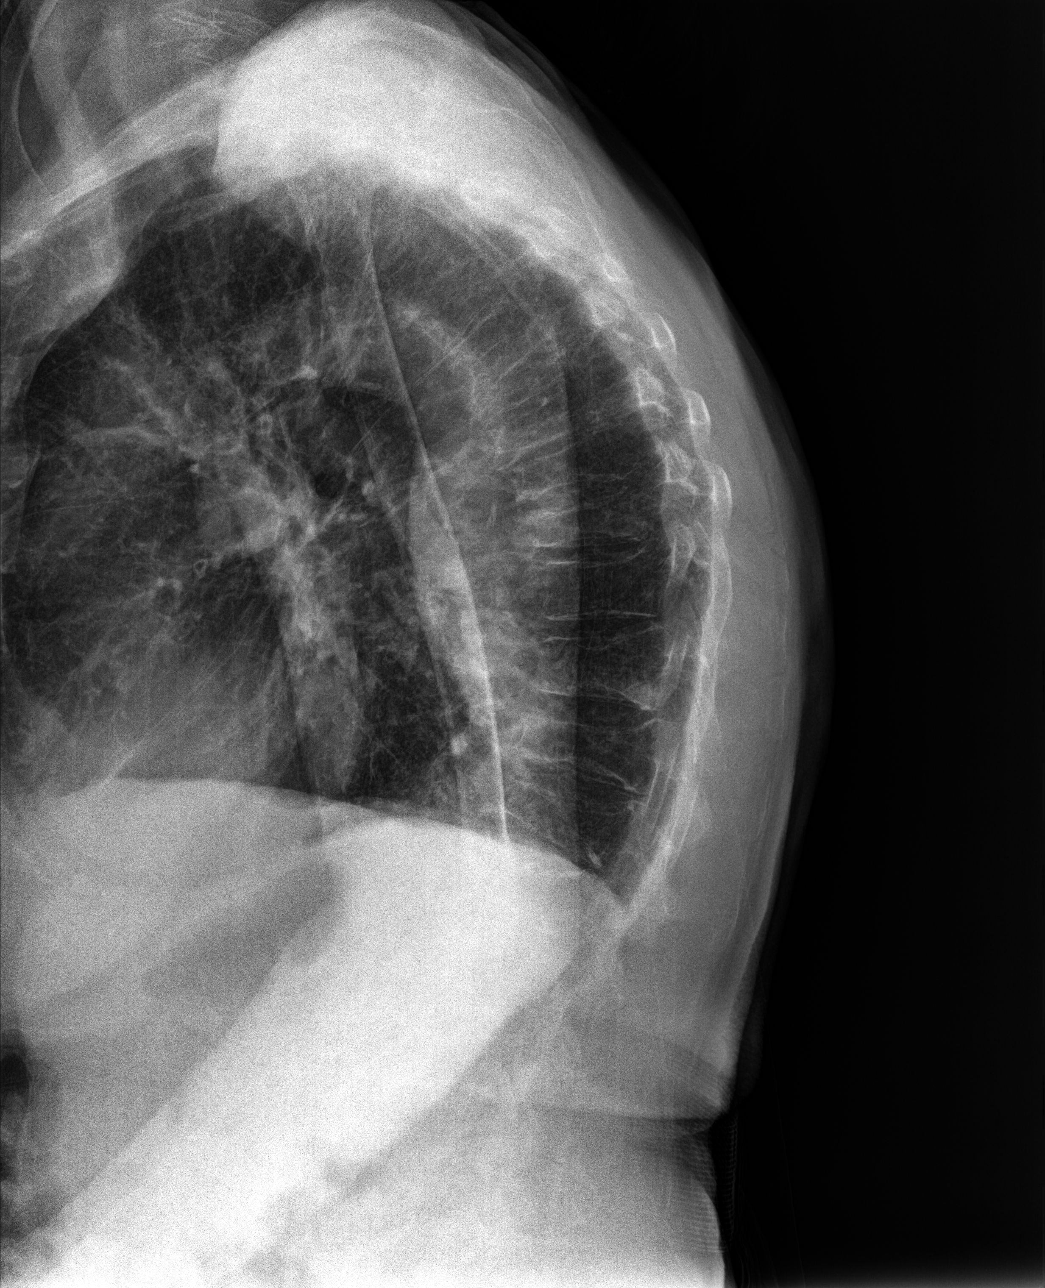

[chest lat (2 of 2)]
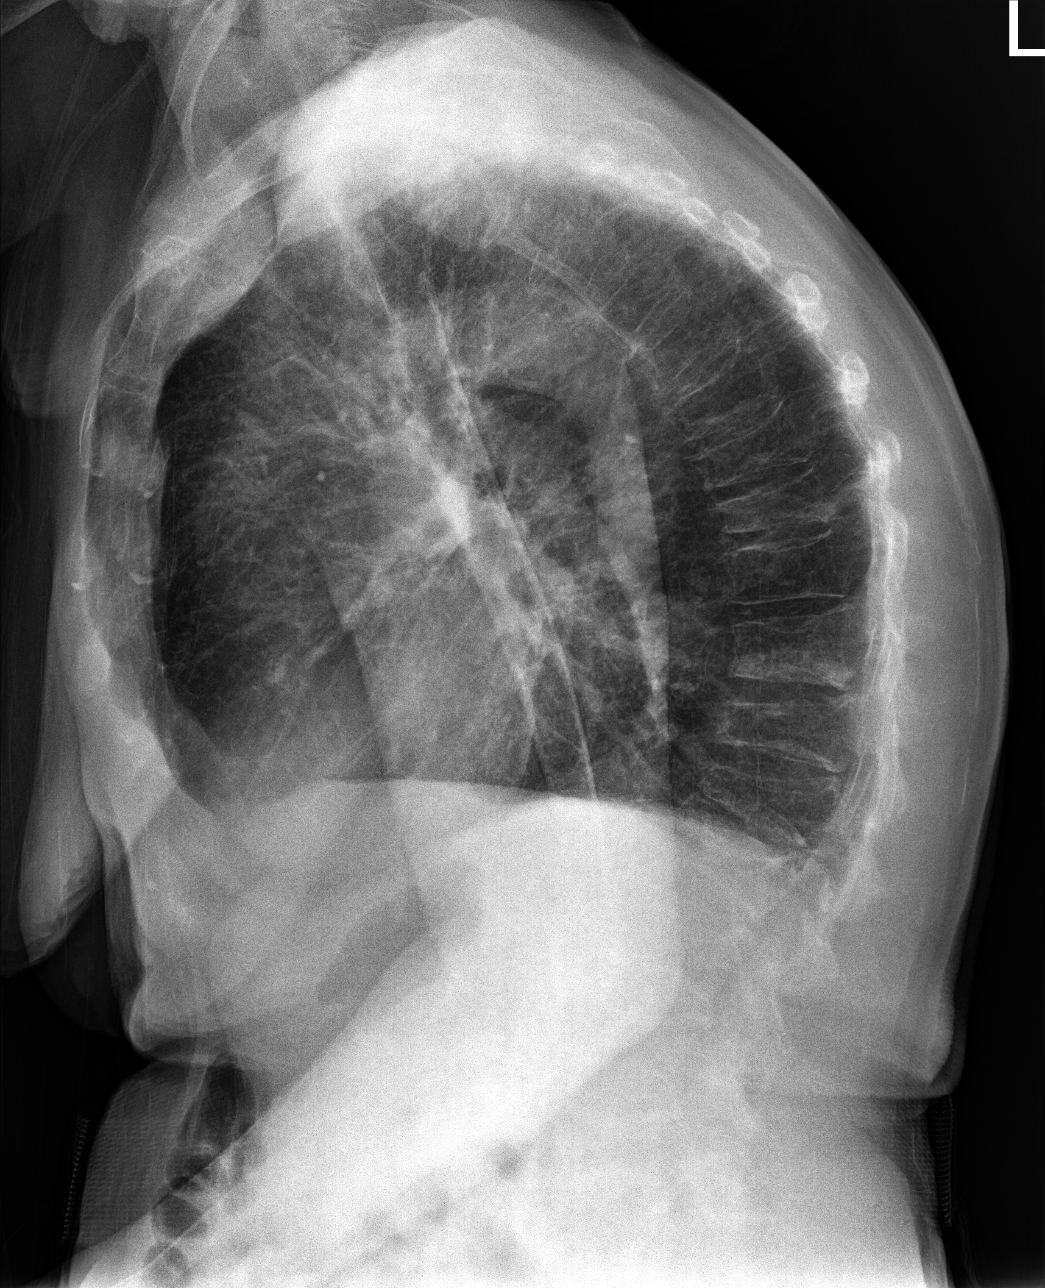

[3 of 3 positions shown; findings below may reference images not displayed]

FINDINGS: Stable cardiomegaly. Normal pulmonary vascularity. Atherosclerotic
calcification of the aortic arch. Tortuous thoracic aorta. The lungs
remain hyperinflated with emphysematous changes and mild diffuse
interstitial prominence. Scarring at the lung bases. No focal
consolidation, pleural effusion, or pneumothorax. No acute osseous
abnormality. Multiple chronic moderate and severe thoracic
compression fractures are unchanged.
IMPRESSION: COPD.  No active cardiopulmonary disease.

## 2020-05-31 IMAGING — CR DG CHEST 1V PORT
1 series · 1 of 1 positions shown · non-contrast
Comparison: Frontal and lateral views 06/26/2018

CLINICAL DATA: Unresponsive episode. Lethargy during breathing
treatment at nursing facility.

EXAM:
PORTABLE CHEST 1 VIEW

[portable]
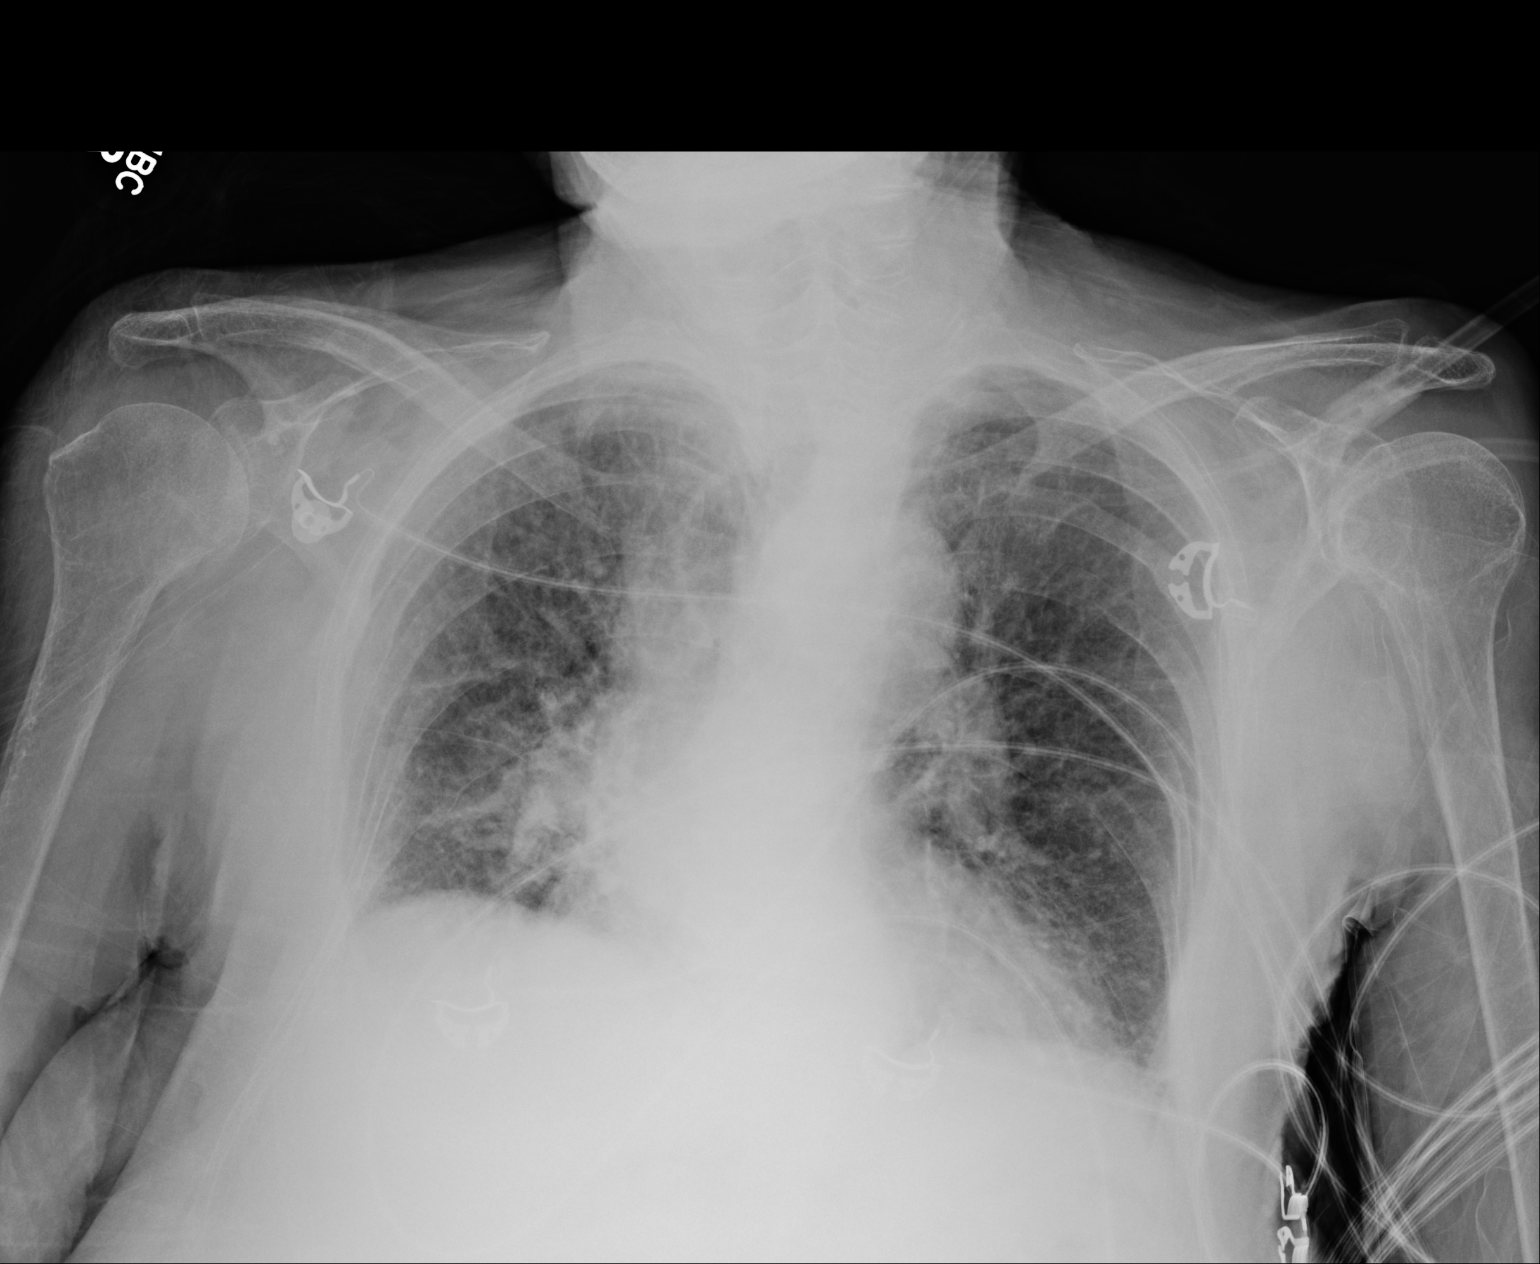

[1 of 1 positions shown; findings below may reference images not displayed]

FINDINGS: Low lung volumes lead to bronchovascular crowding. Mild chronic
bronchial thickening. The heart is enlarged. Unchanged mediastinal
contours with aortic tortuosity. Linear atelectasis or scarring in
the right lung. No pleural effusion or pneumothorax. No acute
osseous abnormalities.
IMPRESSION: Low lung volumes with bronchovascular crowding superimposed on
chronic bronchial thickening. Cardiomegaly.
